# Patient Record
Sex: Female | Born: 1965 | Race: Black or African American | Hispanic: No | Marital: Single | Smoking: Never smoker
Health system: Southern US, Community
[De-identification: ages and names within clinical notes are randomized; demographics above are authoritative.]

## PROBLEM LIST (undated history)

## (undated) DIAGNOSIS — E785 Hyperlipidemia, unspecified: Secondary | ICD-10-CM

## (undated) HISTORY — DX: Hyperlipidemia, unspecified: E78.5

---

## 2006-08-29 ENCOUNTER — Ambulatory Visit: Payer: Self-pay | Admitting: Internal Medicine

## 2006-08-29 LAB — CONVERTED CEMR LAB
ALT: 12 units/L (ref 0–40)
AST: 15 units/L (ref 0–37)
Albumin: 3.8 g/dL (ref 3.5–5.2)
Alkaline Phosphatase: 55 units/L (ref 39–117)
Basophils Relative: 0.4 % (ref 0.0–1.0)
Bilirubin Urine: NEGATIVE
Chloride: 105 meq/L (ref 96–112)
Cholesterol: 212 mg/dL (ref 0–200)
Crystals: NEGATIVE
Eosinophil percent: 3.5 % (ref 0.0–5.0)
GFR calc non Af Amer: 84 mL/min
Glomerular Filtration Rate, Af Am: 102 mL/min/{1.73_m2}
Glucose, Bld: 91 mg/dL (ref 70–99)
HCT: 37.3 % (ref 36.0–46.0)
HDL: 53.6 mg/dL (ref >39.0)
LDL DIRECT: 152.7 mg/dL
Nitrite: NEGATIVE
Platelets: 267 10*3/uL (ref 150–400)
Potassium: 4 meq/L (ref 3.5–5.1)
RBC: 4.12 M/uL (ref 3.87–5.11)
RDW: 11.4 % — ABNORMAL LOW (ref 11.5–14.6)
Sodium: 141 meq/L (ref 135–145)
Specific Gravity, Urine: 1.025 (ref 1.000–1.03)
Total Bilirubin: 0.8 mg/dL (ref 0.3–1.2)
Total Protein: 7.1 g/dL (ref 6.0–8.3)
WBC: 3.6 10*3/uL — ABNORMAL LOW (ref 4.5–10.5)

## 2006-09-02 ENCOUNTER — Ambulatory Visit: Payer: Self-pay | Admitting: Internal Medicine

## 2006-11-07 ENCOUNTER — Ambulatory Visit: Payer: Self-pay | Admitting: Internal Medicine

## 2006-11-18 ENCOUNTER — Ambulatory Visit: Payer: Self-pay | Admitting: Internal Medicine

## 2007-09-14 ENCOUNTER — Encounter: Payer: Self-pay | Admitting: Internal Medicine

## 2008-01-22 ENCOUNTER — Ambulatory Visit: Payer: Self-pay | Admitting: Internal Medicine

## 2008-01-22 DIAGNOSIS — L910 Hypertrophic scar: Secondary | ICD-10-CM

## 2008-01-22 DIAGNOSIS — J309 Allergic rhinitis, unspecified: Secondary | ICD-10-CM | POA: Insufficient documentation

## 2009-05-22 LAB — CONVERTED CEMR LAB: Pap Smear: NORMAL

## 2010-01-28 ENCOUNTER — Telehealth: Payer: Self-pay | Admitting: Internal Medicine

## 2010-02-02 ENCOUNTER — Ambulatory Visit: Payer: Self-pay | Admitting: Internal Medicine

## 2010-06-05 ENCOUNTER — Ambulatory Visit: Payer: Self-pay | Admitting: Internal Medicine

## 2010-06-05 DIAGNOSIS — E785 Hyperlipidemia, unspecified: Secondary | ICD-10-CM

## 2010-06-05 LAB — CONVERTED CEMR LAB
Albumin: 3.9 g/dL (ref 3.5–5.2)
BUN: 15 mg/dL (ref 6–23)
Basophils Absolute: 0 10*3/uL (ref 0.0–0.1)
Chloride: 105 meq/L (ref 96–112)
Cholesterol: 220 mg/dL — ABNORMAL HIGH (ref 0–200)
Creatinine, Ser: 0.8 mg/dL (ref 0.4–1.2)
Direct LDL: 151.3 mg/dL
Eosinophils Absolute: 0.1 10*3/uL (ref 0.0–0.7)
GFR calc non Af Amer: 104.75 mL/min (ref >60)
HCT: 35.7 % — ABNORMAL LOW (ref 36.0–46.0)
Hemoglobin: 11.6 g/dL — ABNORMAL LOW (ref 12.0–15.0)
Leukocytes, UA: NEGATIVE
Lymphs Abs: 1.5 10*3/uL (ref 0.7–4.0)
MCHC: 32.6 g/dL (ref 30.0–36.0)
Monocytes Relative: 8.9 % (ref 3.0–12.0)
Neutro Abs: 2.9 10*3/uL (ref 1.4–7.7)
Nitrite: NEGATIVE
Platelets: 249 10*3/uL (ref 150.0–400.0)
Potassium: 4.4 meq/L (ref 3.5–5.1)
RDW: 13 % (ref 11.5–14.6)
Specific Gravity, Urine: >=1.03 (ref 1.000–1.030)
TSH: 0.78 microintl units/mL (ref 0.35–5.50)
Total Bilirubin: 0.8 mg/dL (ref 0.3–1.2)
Total CHOL/HDL Ratio: 4
Triglycerides: 41 mg/dL (ref 0.0–149.0)
Urobilinogen, UA: 1 (ref 0.0–1.0)
VLDL: 8.2 mg/dL (ref 0.0–40.0)
pH: 6.5 (ref 5.0–8.0)

## 2010-11-10 NOTE — Assessment & Plan Note (Signed)
Summary: CPX/AETNA/#/CD   Vital Signs:  Patient profile:   45 year old female Height:      61 inches Weight:      138 pounds BMI:     26.17 O2 Sat:      99 % on Room air Temp:     98.5 degrees F oral Pulse rate:   67 / minute BP sitting:   110 / 70  (left arm) Cuff size:   regular  Vitals Entered By: Bill Salinas CMA (June 05, 2010 3:22 PM)  O2 Flow:  Room air CC: cpx/ ab   Primary Care Provider:  Norins  CC:  cpx/ ab.  History of Present Illness: Patient presents for routine medical exam. She sees Abigail Harman, MD in [facility] for Gyn - last exam  Sept '09. She has an appointment Monday, Aug 29th. She is good: no major illness, no surgies no injuries.  Current Medications (verified): 1)  None  Allergies (verified): 1)  Sulfamethoxazole (Sulfamethoxazole)  Past History:  Past Surgical History: C-section - uncomplicated  Family History: father - '30; HTN mother - '39: HTN, DM Maternal Kin wiht DM Neg- breast or colon cancer; CAD  Social History: [employer]. Married '04 Step-daughter, 1 daughter - '05 work: Sales promotion account executive -Engineer, maintenance (IT) close to her extended family in [place]- some are migrating south Marriage in good health, SO in good health   Review of Systems  The patient denies anorexia, fever, weight loss, weight gain, vision loss, decreased hearing, hoarseness, chest pain, syncope, dyspnea on exertion, prolonged cough, headaches, hemoptysis, abdominal pain, severe indigestion/heartburn, hematuria, incontinence, muscle weakness, suspicious skin lesions, transient blindness, difficulty walking, depression, abnormal bleeding, enlarged lymph nodes, angioedema, and breast masses.    Physical Exam  General:  Well-developed,well-nourished,in no acute distress; alert,appropriate and cooperative throughout examination Head:  Normocephalic and atraumatic without obvious abnormalities. No apparent alopecia or balding. Eyes:   vision grossly intact, pupils equal, pupils round, pupils react to accomodation, corneas and lenses clear, no injection, no optic disk abnormalities, and no retinal abnormalitiies.   Ears:  External ear exam shows no significant lesions or deformities.  Otoscopic examination reveals clear canals, tympanic membranes are intact bilaterally without bulging, retraction, inflammation or discharge. Hearing is grossly normal bilaterally. Nose:  no external deformity and no external erythema.   Mouth:  Oral mucosa and oropharynx without lesions or exudates.  Teeth in good repair. Neck:  supple, full ROM, no thyromegaly, and no carotid bruits.   Chest Wall:  No deformities, masses, or tenderness noted. Breasts:  deferred to gyn Lungs:  Normal respiratory effort, chest expands symmetrically. Lungs are clear to auscultation, no crackles or wheezes. Heart:  Normal rate and regular rhythm. S1 and S2 normal without gallop, murmur, click, rub or other extra sounds. Abdomen:  soft, non-tender, normal bowel sounds, no masses, no guarding, and no hepatomegaly.   Genitalia:  deferred to gyn  Msk:  normal ROM, no joint tenderness, no joint swelling, and no joint warmth.   Pulses:  2+ radial and DP pulses Extremities:  No clubbing, cyanosis, edema, or deformity noted with normal full range of motion of all joints.   Neurologic:  alert & oriented X3, cranial nerves II-XII intact, strength normal in all extremities, sensation intact to light touch, gait normal, and DTRs symmetrical and normal.   Skin:  turgor normal, color normal, no suspicious lesions, and no ecchymoses.   Cervical Nodes:  no anterior cervical adenopathy and no posterior cervical adenopathy.  Psych:  Oriented X3, memory intact for recent and remote, normally interactive, good eye contact, and not anxious appearing.     Impression & Recommendations:  Problem # 1:  ALLERGIC RHINITIS (ICD-477.9) Stable without active symptoms at this time.    Problem # 2:  HYPERCHOLESTEROLEMIA (ICD-272.0) Mild elevation in LDL at 151 with a goal of 409 or less. NCEP guidelines with a treatment threshhold of 160 +.   Plan - life-style management with diet and exercise. Provided referral to [employer]. gov for additional information.          follow-up lab in 6 months  Problem # 3:  Preventive Health Care (ICD-V70.0) Unremarkable interval history. Normal exam. Labs normal except for cholesterol. Current with Gyn and breast health. For tetnus today.  In summary - a very nice woman who is medically stable with mild elevation in cholesterol. Return for lab as noted. Return for general exam and lab in 3 years otherwise return as needed.    Patient: Abigail [name] Note: All result statuses are Final unless otherwise noted.  Tests: (1) BMP (METABOL)   Sodium                    141 mEq/L                   135-145   Potassium                 4.4 mEq/L                   3.5-5.1   Chloride                  105 mEq/L                   96-112   Carbon Dioxide            29 mEq/L                    19-32   Glucose                   80 mg/dL                    81-19   BUN                       15 mg/dL                    1-47   Creatinine                0.8 mg/dL                   8.2-9.5   Calcium                   9.2 mg/dL                   6.2-13.0   GFR                       104.75 mL/min               >60  Tests: (2) Lipid Panel (LIPID)   Cholesterol          [H]  220 mg/dL  0-200     ATP III Classification            Desirable:  < 200 mg/dL                    Borderline High:  200 - 239 mg/dL               High:  > = 240 mg/dL   Triglycerides             41.0 mg/dL                  1.6-109.6     Normal:  <150 mg/dL     Borderline High:  045 - 199 mg/dL   HDL                       40.98 mg/dL                 >11.91   VLDL Cholesterol          8.2 mg/dL                   4.7-82.9  CHO/HDL Ratio:  CHD Risk                              4                    Men          Women     1/2 Average Risk     3.4          3.3     Average Risk          5.0          4.4     2X Average Risk          9.6          7.1     3X Average Risk          15.0          11.0                           Tests: (3) CBC Platelet w/Diff (CBCD)   White Cell Count          5.0 K/uL                    4.5-10.5   Red Cell Count            3.93 Mil/uL                 3.87-5.11   Hemoglobin           [L]  11.6 g/dL                   56.2-13.0   Hematocrit           [L]  35.7 %                      36.0-46.0   MCV                       90.7 fl                     78.0-100.0   MCHC  32.6 g/dL                   04.5-40.9   RDW                       13.0 %                      11.5-14.6   Platelet Count            249.0 K/uL                  150.0-400.0   Neutrophil %              57.8 %                      43.0-77.0   Lymphocyte %              30.8 %                      12.0-46.0   Monocyte %                8.9 %                       3.0-12.0   Eosinophils%              2.0 %                       0.0-5.0   Basophils %               0.5 %                       0.0-3.0   Neutrophill Absolute      2.9 K/uL                    1.4-7.7   Lymphocyte Absolute       1.5 K/uL                    0.7-4.0   Monocyte Absolute         0.4 K/uL                    0.1-1.0  Eosinophils, Absolute                             0.1 K/uL                    0.0-0.7   Basophils Absolute        0.0 K/uL                    0.0-0.1  Tests: (4) Hepatic/Liver Function Panel (HEPATIC)   Total Bilirubin           0.8 mg/dL                   8.1-1.9   Direct Bilirubin          0.1 mg/dL                   1.4-7.8   Alkaline Phosphatase      47 U/L  39-117   AST                       12 U/L                      0-37   ALT                       8 U/L                       0-35   Total Protein             6.7 g/dL                     1.6-1.0   Albumin                   3.9 g/dL                    9.6-0.4  Tests: (5) TSH (TSH)   FastTSH                   0.78 uIU/mL                 0.35-5.50  Tests: (6) UDip Only (UDIP)   Color                     YELLOW       RANGE:  Yellow;Lt. Yellow   Clarity                   CLEAR                       Clear   Specific Gravity          >=1.030                     1.000 - 1.030   Urine Ph                  6.5                         5.0-8.0   Protein                   NEGATIVE                    Negative   Urine Glucose             NEGATIVE                    Negative   Ketones                   NEGATIVE                    Negative   Urine Bilirubin           NEGATIVE                    Negative   Blood                     NEGATIVE                    Negative   Urobilinogen  1.0                         0.0 - 1.0   Leukocyte Esterace        NEGATIVE                    Negative   Nitrite                   NEGATIVE                    Negative  Tests: (7) Cholesterol LDL - Direct (DIRLDL)  Cholesterol LDL - Direct                             151.3 mg/dL     Optimal:  <161 mg/dL     Near or Above Optimal:  100-129 mg/dL     Borderline High:  096-045 mg/dL     High:  409-811 mg/dL     Very High:  >914 mg/dL  Preventive Care Screening  Pap Smear:    Date:  05/22/2009    Results:  normal

## 2010-11-10 NOTE — Progress Notes (Signed)
Summary: STREP?   Phone Note Call from Patient   Caller: [phone] Summary of Call: Pt c/o of "not feeling 100%". Has some sinus congestion and clear drainage. No fever, cough, sore throat, body aches or chills. Took otc benadryl and c/o some dizzyness after. Daughter was just dx w/strep, she had no sore throat or fever.  Pt scheduled for apt Monday. Advised her to call office if any new symptoms or concerns.  Initial call taken by: Lamar Sprinkles, CMA,  January 28, 2010 11:43 AM  Follow-up for Phone Call        agree with recommendation Follow-up by: Jacques Navy MD,  January 28, 2010 1:01 PM

## 2010-11-16 ENCOUNTER — Other Ambulatory Visit: Payer: Self-pay

## 2011-02-04 ENCOUNTER — Other Ambulatory Visit (INDEPENDENT_AMBULATORY_CARE_PROVIDER_SITE_OTHER): Payer: Self-pay

## 2011-02-04 ENCOUNTER — Telehealth: Payer: Self-pay | Admitting: *Deleted

## 2011-02-04 DIAGNOSIS — N3000 Acute cystitis without hematuria: Secondary | ICD-10-CM

## 2011-02-04 LAB — URINALYSIS, ROUTINE W REFLEX MICROSCOPIC
Bilirubin Urine: NEGATIVE
Ketones, ur: NEGATIVE
Urobilinogen, UA: 0.2 (ref 0.0–1.0)
pH: 6.5 (ref 5.0–8.0)

## 2011-02-04 MED ORDER — CIPROFLOXACIN HCL 250 MG PO TABS: 250.0000 mg | ORAL_TABLET | Freq: Two times a day (BID) | ORAL | Status: AC

## 2011-02-04 NOTE — Telephone Encounter (Signed)
Patient requesting rx for UTI - Would you like pt to have labs today? She is leaving to go out of town in the am

## 2011-02-04 NOTE — Telephone Encounter (Signed)
Patient informed. 

## 2011-02-04 NOTE — Telephone Encounter (Signed)
Results ready, please advise.

## 2011-02-04 NOTE — Telephone Encounter (Signed)
Moderately positive U/A - will eScribe cipro 250 mg bid x 5. Order in but not signed - don't know her pharmacy

## 2011-02-04 NOTE — Telephone Encounter (Signed)
OK u/a per MD. She works in [employer] - will pt lab in as stat. Hold open for results.

## 2011-02-15 ENCOUNTER — Telehealth: Payer: Self-pay | Admitting: *Deleted

## 2011-02-15 MED ORDER — FLUCONAZOLE 150 MG PO TABS: 150.0000 mg | ORAL_TABLET | Freq: Once | ORAL | Status: AC

## 2011-02-15 NOTE — Telephone Encounter (Signed)
Patient informed. 

## 2011-02-15 NOTE — Telephone Encounter (Signed)
Patient requesting RX for vaginal yeast infection - she just completed cipro for UTI.

## 2011-02-15 NOTE — Telephone Encounter (Signed)
K. Diflucan 150 sent to pharmacy

## 2011-02-26 NOTE — Assessment & Plan Note (Signed)
[facility]                             PRIMARY CARE OFFICE NOTE   NAME:STEVENSBasia, [name]                         MRN:          [id number]  DATE:09/02/2006                            DOB:          05-08-1966    Abigail [name] is a delightful African American woman, who presents for a  general medical followup.  She was last seen April 04, 2003.   INTERVAL HISTORY:  The patient had a 45-year-old daughter in the interval.  She reports her health has otherwise been excellent.   PAST MEDICAL HISTORY:  SURGERY:  None.  MEDICAL:  The patient had the usual  childhood diseases of mumps and chicken pox.  She is fully immunized.  She  has had the single pregnancy.   FAMILY HISTORY:  Mother with hypertension, father with hypertension.  There  is a positive family history in maternal kinship for diabetes.  Negative  history of coronary artery disease, breast cancer, lung cancer or colon  cancer.   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  The patient has now  been married for almost 4 years.  Her husband works for [employer].  The  patient also reports that she adopted her niece's children, ages 70 and 66,  after her untimely death from breast cancer at an early age.   CURRENT MEDICATIONS:  None.   REVIEW OF SYSTEMS:  The patient has had no constitutional problems.  She has  had an eye exam in the last 24 months, no ENT problems.  The patient has had  several episodes of right chest pain that is intermittent in nature, can  last up to 30 minutes, occurs at rest.  She has no exertional discomfort or  pain.  She has had no limitation in her activities.  She has had no  respiratory, GI, GU or musculoskeletal problems.   PHYSICAL EXAMINATION:  Temperature was 96.8, blood pressure 90/70, pulse was  60,  weight 147.  GENERAL APPEARANCE:  This is a well-groomed, well-nourished woman,  attractively dressed, in no acute distress.  HEENT:  Normocephalic, atraumatic.  EACs and TMS were normal.  Oropharynx  was native dentition in good repair, no buccal or palate lesions were noted,  the posterior pharynx was clear, conjunctivae and sclerae were clear,  PERRLA, EOMI.  Funduscopic exam was unremarkable.  NECK:  Supple without thyromegaly.  NODES:  No adenopathy was noted in the cervical or supraclavicular regions.  CHEST:  No CVA tenderness.  LUNGS:  Clear to auscultation and percussion.  CARDIOVASCULAR:  2+ radial pulses, no JVD or carotid bruits.  She had a  quiet precordium with a regular rate and rhythm, without murmurs, rubs or  gallops.  ABDOMEN:  Soft, no guarding, no rebound, no organosplenomegaly was noted.  BREAST/PELVIC:  Exam was deferred to gynecology, Dr. Neva Seat in [facility].  EXTREMITIES:  Without clubbing, cyanosis, edema or deformity.  NEUROLOGIC:  Exam was grossly nonfocal.  SKIN:  Clear.   DATABASE:  Hemoglobin was 12 grams, white count was 3600, but a normal  differential was noted.  Platelet count 267,000.  Chemistries revealed a  glucose of 91, electrolytes were normal, kidney function normal with a  creatinine of 0.8, GFR of 102.  Liver functions were normal.  Cholesterol  was 212, triglycerides 35, HDL was 53.6, LDL was elevated at 152.7.  Thyroid  function normal with a TSH of 1.08.  Urinalysis was negative.   ASSESSMENT AND PLAN:  1. Hyperlipidemia.  Patient with mild hyperlipidemia, with an LDL above      goal of 130.  In reviewing the patient's chart, she had an LDL of 147.9      in 2004.  PLAN:  Lifestyle management with a low-fat diet and continued      regular exercise.  I have recommended a followup study in 3 to 6 months      for monitoring, and if she remains above the 130 goal could consider      her as a candidate for medical intervention, although her  cardiac risk      profile is quite low.  2. Health maintenance.  The patient is current with her gynecologist.  Her      examination is otherwise unremarkable.  I would recommend a general      internal medicine physical exam in 3 years.  We do need to follow up on      her lipids, however.     Rosalyn Gess Norins, MD  Electronically Signed    MEN/MedQ  DD: 09/03/2006  DT: 09/03/2006  Job #: [id number]   cc:   Prince Solian

## 2011-02-26 NOTE — Assessment & Plan Note (Signed)
[facility]                                 ON-CALL NOTE   NAME:STEVENSKassidee, Narciso                         MRN:          [id number]  DATE:11/06/2006                            DOB:          02-19-1966    TIME RECEIVED:  1:58pm.   PHYSICIAN:  Dr. Debby Bud.   TELEPHONE:  [phone].   The patient complains of several days of head cold symptoms with sinus  pressure and headache, now today she has an earache. There is no fever  or coughing. She is drinking fluids. My response is to use ibuprofen, up  to 800 mg every 6 hours as needed, also Sudafed as needed. She can be  seen in the office tomorrow if she is no better.     Tera Mater. Clent Ridges, MD  Electronically Signed    SAF/MedQ  DD: 11/06/2006  DT: 11/07/2006  Job #: [id number]

## 2011-05-21 ENCOUNTER — Telehealth: Payer: Self-pay | Admitting: *Deleted

## 2011-05-21 MED ORDER — CIPROFLOXACIN HCL 250 MG PO TABS: 250.0000 mg | ORAL_TABLET | Freq: Two times a day (BID) | ORAL | Status: AC

## 2011-05-21 NOTE — Telephone Encounter (Signed)
Called - no answer. Left a msg - will call in cipro for possible UTI. Advised taht she may want to make an ov- it has been a while

## 2011-05-21 NOTE — Telephone Encounter (Signed)
Pt has contracted another UTI and would like something called in. Please Advise

## 2011-06-04 ENCOUNTER — Telehealth: Payer: Self-pay | Admitting: *Deleted

## 2011-06-04 DIAGNOSIS — Z0000 Encounter for general adult medical examination without abnormal findings: Secondary | ICD-10-CM

## 2011-06-04 NOTE — Telephone Encounter (Signed)
Received staff msg pt is needing CPX labs entered in EPIC. Has appt set for 08/04/11. Entered labs in EPIC.Marland KitchenMarland Kitchen8/24/12@1 :02pm/LMB

## 2011-06-25 ENCOUNTER — Other Ambulatory Visit (HOSPITAL_COMMUNITY)
Admission: RE | Admit: 2011-06-25 | Discharge: 2011-06-25 | Disposition: A | Discharge status: Home / Self Care | Payer: Managed Care, Other (non HMO) | Source: Ambulatory Visit | Attending: Obstetrics and Gynecology | Admitting: Obstetrics and Gynecology

## 2011-06-25 ENCOUNTER — Other Ambulatory Visit: Payer: Self-pay | Admitting: Obstetrics and Gynecology

## 2011-06-25 DIAGNOSIS — Z1159 Encounter for screening for other viral diseases: Secondary | ICD-10-CM | POA: Insufficient documentation

## 2011-06-25 DIAGNOSIS — Z124 Encounter for screening for malignant neoplasm of cervix: Secondary | ICD-10-CM | POA: Insufficient documentation

## 2011-08-03 ENCOUNTER — Encounter: Payer: Self-pay | Admitting: *Deleted

## 2011-08-04 ENCOUNTER — Other Ambulatory Visit: Payer: Self-pay | Admitting: Internal Medicine

## 2011-08-04 ENCOUNTER — Ambulatory Visit (INDEPENDENT_AMBULATORY_CARE_PROVIDER_SITE_OTHER): Payer: Managed Care, Other (non HMO) | Admitting: Internal Medicine

## 2011-08-04 ENCOUNTER — Other Ambulatory Visit (INDEPENDENT_AMBULATORY_CARE_PROVIDER_SITE_OTHER): Payer: Managed Care, Other (non HMO)

## 2011-08-04 ENCOUNTER — Encounter: Payer: Self-pay | Admitting: Internal Medicine

## 2011-08-04 DIAGNOSIS — E7800 Pure hypercholesterolemia, unspecified: Secondary | ICD-10-CM

## 2011-08-04 DIAGNOSIS — Z0000 Encounter for general adult medical examination without abnormal findings: Secondary | ICD-10-CM

## 2011-08-04 LAB — CBC WITH DIFFERENTIAL/PLATELET
Basophils Absolute: 0 10*3/uL (ref 0.0–0.1)
Eosinophils Absolute: 0.1 10*3/uL (ref 0.0–0.7)
HCT: 36.9 % (ref 36.0–46.0)
Lymphs Abs: 1.3 10*3/uL (ref 0.7–4.0)
MCHC: 32.7 g/dL (ref 30.0–36.0)
Monocytes Absolute: 0.4 10*3/uL (ref 0.1–1.0)
Monocytes Relative: 12 % (ref 3.0–12.0)
Platelets: 240 10*3/uL (ref 150.0–400.0)
RDW: 12.7 % (ref 11.5–14.6)

## 2011-08-04 LAB — BASIC METABOLIC PANEL
CO2: 30 mEq/L (ref 19–32)
Calcium: 9.1 mg/dL (ref 8.4–10.5)
GFR: 92.97 mL/min (ref >60.00)
Sodium: 142 mEq/L (ref 135–145)

## 2011-08-04 LAB — HEPATIC FUNCTION PANEL
Alkaline Phosphatase: 49 U/L (ref 39–117)
Bilirubin, Direct: 0.1 mg/dL (ref 0.0–0.3)
Total Bilirubin: 0.5 mg/dL (ref 0.3–1.2)

## 2011-08-04 LAB — LIPID PANEL
Cholesterol: 202 mg/dL — ABNORMAL HIGH (ref 0–200)
Total CHOL/HDL Ratio: 3
Triglycerides: 53 mg/dL (ref 0.0–149.0)

## 2011-08-04 LAB — URINALYSIS, ROUTINE W REFLEX MICROSCOPIC
Bilirubin Urine: NEGATIVE
Ketones, ur: NEGATIVE
Specific Gravity, Urine: 1.02 (ref 1.000–1.030)
Total Protein, Urine: NEGATIVE
Urine Glucose: NEGATIVE
pH: 6 (ref 5.0–8.0)

## 2011-08-04 LAB — LDL CHOLESTEROL, DIRECT: Direct LDL: 139.4 mg/dL

## 2011-08-04 NOTE — Assessment & Plan Note (Addendum)
Reviewed NCEP guidelines and treatment thresholds. Cardia risk calculator - 1% risk of cardiac event in the next 10 years based on '11 lipid values. Current lipid panel reveals much lower LDL, close to goal of 130 or less (strong work). No indication for medical therapy.  Plan - continue life-style management.

## 2011-08-04 NOTE — Progress Notes (Signed)
  Subjective:    Patient ID: Abigail [name], female    DOB: October 09, 1966, 45 y.o.   MRN: [id number]  HPI Abigail [name] presents today for an annual wellness exam. She has seen Dr. Rudean Haskell in [facility] for normal Pelvic/PAP and breast exam. She is feeling and doing well. She has been working on low fat diet and exercising on a regular basis.    Review of Systems Constitutional:  Negative for fever, chills, activity change and unexpected weight change.  HEENT:  Negative for hearing loss, ear pain, congestion, neck stiffness and postnasal drip. Negative for sore throat or swallowing problems. Negative for dental complaints. Had wisdom tooth extracted - left lower.  Eyes: Negative for vision loss or change in visual acuity.  Respiratory: Negative for chest tightness and wheezing. Negative for DOE.   Cardiovascular: Negative for chest pain or palpitations. No decreased exercise tolerance Gastrointestinal: No change in bowel habit. No bloating or gas. No reflux or indigestion Genitourinary: Negative for urgency, frequency, flank pain and difficulty urinating.  Musculoskeletal: Negative for myalgias, back pain, arthralgias and gait problem.  Neurological: Negative for dizziness, tremors, weakness and headaches.  Hematological: Negative for adenopathy.  Psychiatric/Behavioral: Negative for behavioral problems and dysphoric mood.       Objective:   Physical Exam Vitals reviewed-very stable Gen'l: well nourished, well developed AA woman in no distress who looks younger than her stated age.  HEENT - [place]/AT, EACs/TMs normal, oropharynx with native dentition in good condition, no buccal or palatal lesions, posterior pharynx clear, mucous membranes moist. C&S clear, PERRLA, fundi - normal Neck - supple, no thyromegaly Nodes- negative submental, cervical, supraclavicular regions Chest - no deformity, no CVAT Lungs - cleat without rales, wheezes. No increased work of breathing Breast - deferred to  gyn Cardiovascular - regular rate and rhythm, quiet precordium, no murmurs, rubs or gallops, 2+ radial. Abdomen - BS+ x 4, no HSM, no guarding or rebound or tenderness Pelvic - deferred to gyn Rectal - deferred to gyn Extremities - no clubbing, cyanosis, edema or deformity.  Neuro - A&O x 3, CN II-XII normal, motor strength normal and equal, DTRs 2+ and symmetrical biceps, radial, and patellar tendons. Cerebellar - no tremor, no rigidity, fluid movement and normal gait. Derm - Head, neck, back, abdomen and extremities without suspicious lesions  Lab Results  Component Value Date   WBC 3.3* 08/04/2011   HGB 12.1 08/04/2011   HCT 36.9 08/04/2011   PLT 240.0 08/04/2011   GLUCOSE 90 08/04/2011   CHOL 202* 08/04/2011   TRIG 53.0 08/04/2011   HDL 58.40 08/04/2011   LDLDIRECT 139.4 08/04/2011   ALT 14 08/04/2011   AST 14 08/04/2011   NA 142 08/04/2011   K 4.6 08/04/2011   CL 107 08/04/2011   CREATININE 0.9 08/04/2011   BUN 15 08/04/2011   CO2 30 08/04/2011   TSH 0.79 08/04/2011           Assessment & Plan:

## 2011-08-04 NOTE — Assessment & Plan Note (Signed)
Interval history is unremarkable. She has been investing in her health: good diet, regular exercise. She is current with her gynecologist for well-woman care. Lab results are excellent. She is current with tetanus immunization.  In summary - a delightful woman who is in good health with a low risk profile. She will return in 18 -24 months for routine follow-up or sooner as needed.

## 2011-09-10 ENCOUNTER — Ambulatory Visit (INDEPENDENT_AMBULATORY_CARE_PROVIDER_SITE_OTHER): Payer: Managed Care, Other (non HMO) | Admitting: Endocrinology

## 2011-09-10 ENCOUNTER — Encounter: Payer: Self-pay | Admitting: Endocrinology

## 2011-09-10 VITALS — BP 114/88 | HR 74 | Temp 98.2°F | Resp 16 | Ht 60.0 in | Wt 143.0 lb

## 2011-09-10 DIAGNOSIS — J209 Acute bronchitis, unspecified: Secondary | ICD-10-CM

## 2011-09-10 MED ORDER — AZITHROMYCIN 500 MG PO TABS: 500.0000 mg | ORAL_TABLET | Freq: Every day | ORAL | Status: AC

## 2011-09-10 NOTE — Progress Notes (Signed)
  Subjective:    Patient ID: Abigail [name], female    DOB: 1966/06/19, 45 y.o.   MRN: [id number]  HPI Pt states 6 days of dry-quality cough in the chest, but no assoc fever. Past Medical History  Diagnosis Date  . Allergic rhinitis   . Hyperlipidemia     LDL 150's '11    Past Surgical History  Procedure Date  . Cesarean section     History   Social History  . Marital Status: Married    Spouse Name: N/A    Number of Children: 3  . Years of Education: 16   Occupational History  . investment counselor    Social History Main Topics  . Smoking status: Never Smoker   . Smokeless tobacco: Never Used  . Alcohol Use: Yes     rare use - twice rare  . Drug Use: No  . Sexually Active: Yes -- Female partner(s)   Other Topics Concern  . Not on file   Social History Narrative   HSG, [employer]- BA -business. Married '04, 1 dtr - '05, raising niece and nephew. Work - Haematologist. Marriage in good. No history of abuse. Working on [employer] -business education at [employer] (oct '12)    No current outpatient prescriptions on file prior to visit.    Allergies  Allergen Reactions  . Sulfamethoxazole     REACTION: swelling    Family History  Problem Relation Age of Onset  . Diabetes Mother   . Hypertension Mother   . Hypertension Father   . Cancer Neg Hx   . Hypertension Sister   . Heart disease Sister     MI - no serious damage to heart   BP 114/88  Pulse 74  Temp(Src) 98.2 F (36.8 C) (Oral)  Resp 16  Ht 5' (1.524 m)  Wt 143 lb (64.864 kg)  BMI 27.93 kg/m2  LMP 09/01/2011  Review of Systems Denies earache and wheezing.  She has nasal congestion.      Objective:   Physical Exam VITAL SIGNS:  See vs page GENERAL: no distress head: no deformity eyes: no periorbital swelling, no proptosis external nose and ears are normal mouth: no lesion seen TM's are red bilat thyroid LUNGS:  Clear to auscultation.     Assessment & Plan:  Acute bronchitis, new

## 2011-09-10 NOTE — Patient Instructions (Addendum)
i have sent a prescription to your pharmacy, for an antibiotic.  Loratadine-d (non-prescription) will help your congestion. I hope you feel better soon.  If you don't feel better by next week, please call dr Debby Bud.

## 2012-07-17 ENCOUNTER — Other Ambulatory Visit: Payer: Self-pay | Admitting: Obstetrics and Gynecology

## 2012-07-17 ENCOUNTER — Other Ambulatory Visit (HOSPITAL_COMMUNITY)
Admission: RE | Admit: 2012-07-17 | Discharge: 2012-07-17 | Disposition: A | Discharge status: Home / Self Care | Payer: Managed Care, Other (non HMO) | Source: Ambulatory Visit | Attending: Obstetrics and Gynecology | Admitting: Obstetrics and Gynecology

## 2012-07-17 DIAGNOSIS — Z124 Encounter for screening for malignant neoplasm of cervix: Secondary | ICD-10-CM | POA: Insufficient documentation

## 2012-09-04 ENCOUNTER — Encounter: Payer: Managed Care, Other (non HMO) | Admitting: Internal Medicine

## 2012-09-04 DIAGNOSIS — Z0289 Encounter for other administrative examinations: Secondary | ICD-10-CM

## 2012-11-22 ENCOUNTER — Ambulatory Visit (INDEPENDENT_AMBULATORY_CARE_PROVIDER_SITE_OTHER): Payer: Managed Care, Other (non HMO) | Admitting: Internal Medicine

## 2012-11-22 ENCOUNTER — Encounter: Payer: Self-pay | Admitting: Internal Medicine

## 2012-11-22 VITALS — BP 110/64 | HR 64 | Temp 98.2°F | Resp 16 | Ht 61.0 in | Wt 146.0 lb

## 2012-11-22 DIAGNOSIS — Z0000 Encounter for general adult medical examination without abnormal findings: Secondary | ICD-10-CM

## 2012-11-22 DIAGNOSIS — E7800 Pure hypercholesterolemia, unspecified: Secondary | ICD-10-CM

## 2012-11-22 DIAGNOSIS — Z23 Encounter for immunization: Secondary | ICD-10-CM

## 2012-11-22 NOTE — Addendum Note (Signed)
Addended by: Rock Nephew T on: 11/22/2012 11:51 AM   Modules accepted: Orders

## 2012-11-22 NOTE — Assessment & Plan Note (Signed)
2012 labs: TC 202, LDL 139, HDL 58  Plan Healthy life-style with a low fat diet.

## 2012-11-22 NOTE — Patient Instructions (Addendum)
Thanks for coming to see me.  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 Treatment regular flex/stretch, lower the heels, use of rub of choice. You can go to [email or web address] for more information.  You are doing great. Your labs in 2012 were 100% normal. Your cholesterol was 202, the LDL (bad) cholesterol was 161, 9 points above goal of 130 or less, the HDL was greater than goal of 40+. Thus, there is no scientific reason to repeat lab.  You will be given a Tdap booster today good for 10 years.  Please consider exercise as a job not a leisure time activity. Read "younger Next Year - for women" a guaranteed  Enjoyable read that is very educational and fun.  You should have a full physical exam in 2 years or so.   Please sign up for MyChart - it is a Photographer.

## 2012-11-22 NOTE — Progress Notes (Signed)
Subjective:    Patient ID: Abigail [name], female    DOB: 1966-06-16, 47 y.o.   MRN: [id number]  HPI Abigail [name] presents for a general medical wellness exam. She is feeling well and doing: Life is good. She has seen her gynecologist Sept '13 and had a normal exam, including breast exam. She has had no major illness, surgery or injury.   \ Past Medical History  Diagnosis Date  . Allergic rhinitis   . Hyperlipidemia     LDL 150's '11   Past Surgical History  Procedure Laterality Date  . Cesarean section     Family History  Problem Relation Age of Onset  . Diabetes Mother   . Hypertension Mother   . Hypertension Father   . Cancer Neg Hx   . Hypertension Sister   . Heart disease Sister     MI - no serious damage to heart   History   Social History  . Marital Status: Married    Spouse Name: N/A    Number of Children: 3  . Years of Education: 16   Occupational History  . investment counselor    Social History Main Topics  . Smoking status: Never Smoker   . Smokeless tobacco: Never Used  . Alcohol Use: Yes     Comment: rare use - twice rare  . Drug Use: No  . Sexually Active: Yes -- Female partner(s)   Other Topics Concern  . Not on file   Social History Narrative   HSG, [employer]- BA -business. Married '04, 1 dtr - '05, raising niece and nephew. Work - Haematologist. Marriage in good. No history of abuse. Working on [employer] -business education at [employer] (oct '12)   Meds - none   Review of Systems Constitutional:  Negative for fever, chills, activity change and unexpected weight change.  HEENT:  Negative for hearing loss, ear pain, congestion, neck stiffness and postnasal drip. Negative for sore throat or swallowing problems. Negative for dental complaints.   Eyes: Negative for vision loss or change in visual acuity.  Respiratory: Negative for chest tightness and wheezing. Negative for DOE.   Cardiovascular: Negative for chest pain or palpitations. No decreased exercise  tolerance Gastrointestinal: No change in bowel habit. No bloating or gas. No reflux or indigestion Genitourinary: Negative for urgency, frequency, flank pain and difficulty urinating.  Musculoskeletal: Negative for myalgias, back pain, arthralgias and gait problem. Mild supra-patellar pain, worse with stair climbing. Neurological: Negative for dizziness, tremors, weakness and headaches.  Hematological: Negative for adenopathy.  Psychiatric/Behavioral: Negative for behavioral problems and dysphoric mood.       Objective:   Physical Exam Filed Vitals:   11/22/12 1014  BP: 110/64  Pulse: 64  Temp: 98.2 F (36.8 C)  Resp: 16   Wt Readings from Last 3 Encounters:  11/22/12 146 lb (66.225 kg)  09/10/11 143 lb (64.864 kg)  08/04/11 140 lb (63.504 kg)   Gen'l: well nourished, well developed AA Woman in no distress HEENT - [place]/AT, EACs/TMs normal, oropharynx with native dentition in good condition, no buccal or palatal lesions, posterior pharynx clear, mucous membranes moist. C&S clear, PERRLA, fundi - normal Neck - supple, no thyromegaly Nodes- negative submental, cervical, supraclavicular regions Chest - no deformity, no CVAT Lungs - clear without rales, wheezes. No increased work of breathing Breast - deferred to gyn Cardiovascular - regular rate and rhythm, quiet precordium, no murmurs, rubs or gallops, 2+ radial, DP and PT pulses Abdomen - BS+ x 4, no HSM,  no guarding or rebound or tenderness Pelvic - deferred to gyn Rectal - deferred to gyn Extremities - no clubbing, cyanosis, edema or deformity.  Neuro - A&O x 3, CN II-XII normal, motor strength normal and equal, DTRs 2+ and symmetrical biceps, radial, and patellar tendons. Cerebellar - no tremor, no rigidity, fluid movement and normal gait. Derm - Head, neck, back, abdomen and extremities without suspicious lesions  Lab Results  Component Value Date   WBC 3.3* 08/04/2011   HGB 12.1 08/04/2011   HCT 36.9 08/04/2011   PLT  240.0 08/04/2011   GLUCOSE 90 08/04/2011   CHOL 202* 08/04/2011   TRIG 53.0 08/04/2011   HDL 58.40 08/04/2011   LDLDIRECT 139.4 08/04/2011   ALT 14 08/04/2011   AST 14 08/04/2011   NA 142 08/04/2011   K 4.6 08/04/2011   CL 107 08/04/2011   CREATININE 0.9 08/04/2011   BUN 15 08/04/2011   CO2 30 08/04/2011   TSH 0.79 08/04/2011          Assessment & Plan:  1. 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 Treatment regular flex/stretch, lower the heels, use of rub of choice. You can go to [email or web address] for more information.

## 2012-11-22 NOTE — Assessment & Plan Note (Signed)
Interval history is benign. Physical exam, sans breast and pelvic, is normal. Knee without grinding or instability. Current wit Gyn. Due for colorectal cancer screening in 2017.  Immunization - Tdap today which is good for 10 years.  Weight management: smart food choices, PORTION SIZE CONTROL  - use your hand as a guide, regular exercise = 30 minutes 3 times a week minimum working to a sustained heart rate of 120. Goal - to loose 1 lb/month. [employer] weight 130 lbs = 16 month project.  In summary - a very nice woman who is medically stable. She is advised to return for general exam in 24-36 months, sooner as needed.

## 2013-08-16 ENCOUNTER — Other Ambulatory Visit: Payer: Self-pay

## 2013-10-30 ENCOUNTER — Telehealth: Payer: Self-pay | Admitting: Internal Medicine

## 2013-10-30 NOTE — Telephone Encounter (Signed)
Pt wants to transfer to Dr. Asa Lente after March when Dr. Benson [place].  She has an appt with Dr. Linda Hedges in late March. Will this be ok?

## 2013-10-30 NOTE — Telephone Encounter (Signed)
Pt is aware.  She will schedule after she sees Dr. Linda Hedges in March.

## 2013-10-30 NOTE — Telephone Encounter (Signed)
A) keep track of #pt reassigned per provider at [facility].... B) ok

## 2014-01-02 ENCOUNTER — Encounter: Payer: Managed Care, Other (non HMO) | Admitting: Internal Medicine

## 2014-01-02 DIAGNOSIS — Z0289 Encounter for other administrative examinations: Secondary | ICD-10-CM

## 2014-07-26 ENCOUNTER — Other Ambulatory Visit: Payer: Self-pay

## 2014-08-30 ENCOUNTER — Ambulatory Visit (INDEPENDENT_AMBULATORY_CARE_PROVIDER_SITE_OTHER): Payer: Managed Care, Other (non HMO) | Admitting: Internal Medicine

## 2014-08-30 ENCOUNTER — Encounter: Payer: Self-pay | Admitting: Internal Medicine

## 2014-08-30 ENCOUNTER — Other Ambulatory Visit (INDEPENDENT_AMBULATORY_CARE_PROVIDER_SITE_OTHER): Payer: Managed Care, Other (non HMO)

## 2014-08-30 VITALS — BP 108/64 | HR 63 | Temp 98.5°F | Resp 12 | Ht 61.0 in | Wt 158.4 lb

## 2014-08-30 DIAGNOSIS — Z0000 Encounter for general adult medical examination without abnormal findings: Secondary | ICD-10-CM

## 2014-08-30 DIAGNOSIS — E785 Hyperlipidemia, unspecified: Secondary | ICD-10-CM

## 2014-08-30 LAB — LIPID PANEL
Cholesterol: 238 mg/dL — ABNORMAL HIGH (ref 0–200)
HDL: 54.1 mg/dL (ref >39.00)
LDL CALC: 170 mg/dL — AB (ref 0–99)
NONHDL: 183.9
Total CHOL/HDL Ratio: 4
Triglycerides: 68 mg/dL (ref 0.0–149.0)
VLDL: 13.6 mg/dL (ref 0.0–40.0)

## 2014-08-30 LAB — BASIC METABOLIC PANEL
BUN: 13 mg/dL (ref 6–23)
CALCIUM: 9.2 mg/dL (ref 8.4–10.5)
CO2: 29 mEq/L (ref 19–32)
Chloride: 103 mEq/L (ref 96–112)
Creatinine, Ser: 0.8 mg/dL (ref 0.4–1.2)
GFR: 101.3 mL/min (ref >60.00)
GLUCOSE: 87 mg/dL (ref 70–99)
Potassium: 3.8 mEq/L (ref 3.5–5.1)
SODIUM: 138 meq/L (ref 135–145)

## 2014-08-30 NOTE — Progress Notes (Signed)
Pre visit review using our clinic review tool, if applicable. No additional management support is needed unless otherwise documented below in the visit note. 

## 2014-08-30 NOTE — Patient Instructions (Signed)
We will check your cholesterol today and call you back with the results.  Come back in about one year. If you have any problems or questions please feel free to call our office.  Exercise to Lose Weight Exercise and a healthy diet may help you lose weight. Your doctor may suggest specific exercises. EXERCISE IDEAS AND TIPS  Choose low-cost things you enjoy doing, such as walking, bicycling, or exercising to workout videos.  Take stairs instead of the elevator.  Walk during your lunch break.  Park your car further away from work or school.  Go to a gym or an exercise class.  Start with 5 to 10 minutes of exercise each day. Build up to 30 minutes of exercise 4 to 6 days a week.  Wear shoes with good support and comfortable clothes.  Stretch before and after working out.  Work out until you breathe harder and your heart beats faster.  Drink extra water when you exercise.  Do not do so much that you hurt yourself, feel dizzy, or get very short of breath. Exercises that burn about 150 calories:  Running 1  miles in 15 minutes.  Playing volleyball for 45 to 60 minutes.  Washing and waxing a car for 45 to 60 minutes.  Playing touch football for 45 minutes.  Walking 1  miles in 35 minutes.  Pushing a stroller 1  miles in 30 minutes.  Playing basketball for 30 minutes.  Raking leaves for 30 minutes.  Bicycling 5 miles in 30 minutes.  Walking 2 miles in 30 minutes.  Dancing for 30 minutes.  Shoveling snow for 15 minutes.  Swimming laps for 20 minutes.  Walking up stairs for 15 minutes.  Bicycling 4 miles in 15 minutes.  Gardening for 30 to 45 minutes.  Jumping rope for 15 minutes.  Washing windows or floors for 45 to 60 minutes. Document Released: 10/30/2010 Document Revised: 12/20/2011 Document Reviewed: 10/30/2010 [facility] Patient Information 2015 [place], [place]. This information is not intended to replace advice given to you by your health care  provider. Make sure you discuss any questions you have with your health care provider.

## 2014-09-01 NOTE — Assessment & Plan Note (Signed)
Patient is up-to-date on her screening, talked to her about healthy diet and exercise. She is a nonsmoker.

## 2014-09-01 NOTE — Progress Notes (Signed)
   Subjective:    Patient ID: Abigail [name], female    DOB: 05-Nov-1965, 48 y.o.   MRN: [id number]  HPI The patient is a 48 year old female who comes in today to establish care. She has no significant past medical history except borderline hyper lipidemia. She does not currently take any medications. She does not have any new complaints today. She tries to be healthy and exercise. She denies chest pain shortness of breath abdominal pains.  Review of Systems  Constitutional: Negative for fever, diaphoresis, activity change, appetite change and fatigue.  HENT: Negative.   Eyes: Negative.   Respiratory: Negative for cough, choking, chest tightness, shortness of breath and wheezing.   Cardiovascular: Negative for chest pain, palpitations and leg swelling.  Gastrointestinal: Negative for nausea, abdominal pain, diarrhea, constipation and abdominal distention.  Genitourinary: Negative.   Musculoskeletal: Negative.   Skin: Negative.   Neurological: Negative.   Psychiatric/Behavioral: Negative.       Objective:   Physical Exam  Constitutional: She is oriented to person, place, and time. She appears well-developed and well-nourished.  HENT:  Head: Normocephalic and atraumatic.  Eyes: EOM are normal.  Neck: Normal range of motion.  Cardiovascular: Normal rate and regular rhythm.   Pulmonary/Chest: Effort normal and breath sounds normal. No respiratory distress. She has no wheezes. She has no rales.  Abdominal: Soft. Bowel sounds are normal. She exhibits no distension. There is no tenderness. There is no rebound.  Musculoskeletal: She exhibits no edema.  Neurological: She is alert and oriented to person, place, and time. Coordination normal.  Skin: Skin is warm and dry.   Filed Vitals:   08/30/14 1315  BP: 108/64  Pulse: 63  Temp: 98.5 F (36.9 C)  TempSrc: Oral  Resp: 12  Height: 5\' 1"  (1.549 m)  Weight: 158 lb 6.4 oz (71.85 kg)  SpO2: 99%      Assessment & Plan:

## 2014-09-01 NOTE — Assessment & Plan Note (Signed)
Recheck lipid panel today. She is currently not on medication but doing well with diet and exercise.

## 2015-10-13 LAB — HM PAP SMEAR

## 2015-10-16 ENCOUNTER — Encounter: Payer: Managed Care, Other (non HMO) | Admitting: Internal Medicine

## 2015-10-17 ENCOUNTER — Encounter: Payer: Self-pay | Admitting: Internal Medicine

## 2015-10-17 ENCOUNTER — Other Ambulatory Visit (INDEPENDENT_AMBULATORY_CARE_PROVIDER_SITE_OTHER): Payer: Managed Care, Other (non HMO)

## 2015-10-17 ENCOUNTER — Ambulatory Visit (INDEPENDENT_AMBULATORY_CARE_PROVIDER_SITE_OTHER): Payer: Managed Care, Other (non HMO) | Admitting: Internal Medicine

## 2015-10-17 VITALS — BP 122/62 | HR 60 | Temp 97.8°F | Resp 12 | Ht 61.0 in | Wt 163.0 lb

## 2015-10-17 DIAGNOSIS — Z0000 Encounter for general adult medical examination without abnormal findings: Secondary | ICD-10-CM

## 2015-10-17 LAB — LIPID PANEL
CHOLESTEROL: 220 mg/dL — AB (ref 0–200)
HDL: 61.9 mg/dL (ref >39.00)
LDL Cholesterol: 146 mg/dL — ABNORMAL HIGH (ref 0–99)
NonHDL: 158.03
TRIGLYCERIDES: 61 mg/dL (ref 0.0–149.0)
Total CHOL/HDL Ratio: 4
VLDL: 12.2 mg/dL (ref 0.0–40.0)

## 2015-10-17 LAB — COMPREHENSIVE METABOLIC PANEL
ALBUMIN: 4.1 g/dL (ref 3.5–5.2)
ALK PHOS: 53 U/L (ref 39–117)
ALT: 13 U/L (ref 0–35)
AST: 16 U/L (ref 0–37)
BUN: 14 mg/dL (ref 6–23)
CO2: 30 mEq/L (ref 19–32)
CREATININE: 0.73 mg/dL (ref 0.40–1.20)
Calcium: 9.6 mg/dL (ref 8.4–10.5)
Chloride: 101 mEq/L (ref 96–112)
GFR: 108.83 mL/min (ref >60.00)
Glucose, Bld: 82 mg/dL (ref 70–99)
POTASSIUM: 4.1 meq/L (ref 3.5–5.1)
SODIUM: 138 meq/L (ref 135–145)
TOTAL PROTEIN: 7.1 g/dL (ref 6.0–8.3)
Total Bilirubin: 0.4 mg/dL (ref 0.2–1.2)

## 2015-10-17 LAB — VITAMIN B12: VITAMIN B 12: 895 pg/mL (ref 211–911)

## 2015-10-17 LAB — TSH: TSH: 1.32 u[IU]/mL (ref 0.35–4.50)

## 2015-10-17 NOTE — Assessment & Plan Note (Signed)
Checking labs, counseled her about weight and exercise. Declines flu shot. Talked to her about colonoscopy when she turns 50 this fall.

## 2015-10-17 NOTE — Progress Notes (Signed)
   Subjective:    Patient ID: Abigail [name], female    DOB: 20-Jan-1966, 50 y.o.   MRN: [id number]  HPI The patient is a 50 YO female coming in for wellness. No new concerns. Her fiance passed unexpectedly over the summer and she is still grieving from that.   PMH, [facility], social history reviewed and updated.   Review of Systems  Constitutional: Negative for fever, diaphoresis, activity change, appetite change and fatigue.  HENT: Negative.   Eyes: Negative.   Respiratory: Negative for cough, choking, chest tightness, shortness of breath and wheezing.   Cardiovascular: Negative for chest pain, palpitations and leg swelling.  Gastrointestinal: Negative for nausea, abdominal pain, diarrhea, constipation and abdominal distention.  Genitourinary: Negative.   Musculoskeletal: Negative.   Skin: Negative.   Neurological: Negative.   Psychiatric/Behavioral: Negative.       Objective:   Physical Exam  Constitutional: She is oriented to person, place, and time. She appears well-developed and well-nourished.  HENT:  Head: Normocephalic and atraumatic.  Eyes: EOM are normal.  Neck: Normal range of motion.  Cardiovascular: Normal rate and regular rhythm.   Pulmonary/Chest: Effort normal and breath sounds normal. No respiratory distress. She has no wheezes. She has no rales.  Abdominal: Soft. Bowel sounds are normal. She exhibits no distension. There is no tenderness. There is no rebound.  Musculoskeletal: She exhibits no edema.  Neurological: She is alert and oriented to person, place, and time. Coordination normal.  Skin: Skin is warm and dry.   Filed Vitals:   10/17/15 1448  BP: 122/62  Pulse: 60  Temp: 97.8 F (36.6 C)  TempSrc: Oral  Resp: 12  Height: 5\' 1"  (1.549 m)  Weight: 163 lb (73.936 kg)      Assessment & Plan:

## 2015-10-17 NOTE — Progress Notes (Signed)
Pre visit review using our clinic review tool, if applicable. No additional management support is needed unless otherwise documented below in the visit note. 

## 2015-10-17 NOTE — Patient Instructions (Signed)
We are checking the blood work today and will call you about the results   Work on exercising 45 minutes per day 6 days per week to lose weight.   Come back in about 1 year and please feel free to call us before then with any problems or questions.   I am really sorry about your fiancee.   Exercising to Lose Weight Exercising can help you to lose weight. In order to lose weight through exercise, you need to do vigorous-intensity exercise. You can tell that you are exercising with vigorous intensity if you are breathing very hard and fast and cannot hold a conversation while exercising. Moderate-intensity exercise helps to maintain your current weight. You can tell that you are exercising at a moderate level if you have a higher heart rate and faster breathing, but you are still able to hold a conversation. HOW OFTEN SHOULD I EXERCISE? Choose an activity that you enjoy and set realistic goals. Your health care provider can help you to make an activity plan that works for you. Exercise regularly as directed by your health care provider. This may include:  Doing resistance training twice each week, such as:  Push-ups.  Sit-ups.  Lifting weights.  Using resistance bands.  Doing a given intensity of exercise for a given amount of time. Choose from these options:  150 minutes of moderate-intensity exercise every week.  75 minutes of vigorous-intensity exercise every week.  A mix of moderate-intensity and vigorous-intensity exercise every week. Children, pregnant women, people who are out of shape, people who are overweight, and older adults may need to consult a health care provider for individual recommendations. If you have any sort of medical condition, be sure to consult your health care provider before starting a new exercise program. WHAT ARE SOME ACTIVITIES THAT CAN HELP ME TO LOSE WEIGHT?   Walking at a rate of at least 4.5 miles an hour.  Jogging or running at a rate of 5 miles  per hour.  Biking at a rate of at least 10 miles per hour.  Lap swimming.  Roller-skating or in-line skating.  Cross-country skiing.  Vigorous competitive sports, such as football, basketball, and soccer.  Jumping rope.  Aerobic dancing. HOW CAN I BE MORE ACTIVE IN MY DAY-TO-DAY ACTIVITIES?  Use the stairs instead of the elevator.  Take a walk during your lunch break.  If you drive, park your car farther away from work or school.  If you take public transportation, get off one stop early and walk the rest of the way.  Make all of your phone calls while standing up and walking around.  Get up, stretch, and walk around every 30 minutes throughout the day. WHAT GUIDELINES SHOULD I FOLLOW WHILE EXERCISING?  Do not exercise so much that you hurt yourself, feel dizzy, or get very short of breath.  Consult your health care provider prior to starting a new exercise program.  Wear comfortable clothes and shoes with good support.  Drink plenty of water while you exercise to prevent dehydration or heat stroke. Body water is lost during exercise and must be replaced.  Work out until you breathe faster and your heart beats faster.   This information is not intended to replace advice given to you by your health care provider. Make sure you discuss any questions you have with your health care provider.   Document Released: 10/30/2010 Document Revised: 10/18/2014 Document Reviewed: 02/28/2014 Elsevier Interactive Patient Education 2016 [place] Sizes A  serving size is a measured amount of food or drink, such as one slice of bread, that has an associated nutrient content. Knowing the serving size of a food or drink can help you determine how much of that food you should consume.  WHAT IS THE SIZE OF ONE SERVING? The size of one healthy serving depends on the food or drink. To determine a serving size, read the food label. If the food or drink does not have a food label,  try to find serving size information online. Or, use the following to estimate the size of one adult serving:  Grain 1 slice bread.  bagel.  cup pasta.  Vegetable  cup cooked or canned vegetables. 1 cup raw, leafy greens.  Fruit  cup canned fruit. 1 medium fruit.  cup dried fruit.  Meat and Other Protein Sources 1 oz meat, poultry, or fish.  cup cooked beans. 1 egg.  cup nuts or seeds. 1 Tbsp nut butter.  cup tofu or tempeh. 2 Tbsp hummus.  Dairy An individual container of yogurt (6-8 oz). 1 piece of cheese the size of your thumb (1 oz). 1 cup (8 oz) milk or milk alternative.  Fat A piece the size of one dice. 1 tsp soft margarine. 1 Tbsp mayonnaise. 1 tsp vegetable oil. 1 Tbsp regular salad dressing. 2 Tbsp low-fat salad dressing.  HOW MANY SERVINGS SHOULD I EAT FROM EACH FOOD GROUP EACH DAY?  The following are the suggested number of servings to try and have every day from each food group. You can also look at your eating throughout the week and aim for meeting these requirements on most days for overall healthy eating.  Grain 6-8 servings. Try to have half of your grains from whole grains, such as whole wheat bread, corn tortillas, oatmeal, brown rice, whole wheat pasta, and bulgur. Vegetable At least 2-3 servings.  Fruit 2 servings.  Meat and Other Protein Foods 5-6 servings. Aim to have lean proteins, such as chicken, [place], fish, beans, or tofu. Dairy 3 servings. Choose low-fat or nonfat if you are trying to control your weight.  Fat 2-3 servings.  IS A SERVING THE SAME THING AS A PORTION? No. A portion is the actual amount you eat, which may be more than one serving. Knowing the specific serving size of a food and the nutritional information that goes with it can help you make a healthy decision on what size portion to eat.  WHAT ARE SOME TIPS TO HELP ME LEARN HEALTHY SERVING SIZES?  Check food labels for serving sizes. Many foods that come as a single portion actually  contain multiple servings.  Determine the serving size of foods you commonly eat and figure out how large a portion you usually eat.  Measure the number of servings that can be held by the bowls, glasses, cups, and plates you typically use. For example, pour your breakfast cereal into your regular bowl and then pour it into a measuring cup.  For 1-2 days, measure the serving sizes of all the foods you eat.  Practice estimating serving sizes and determining how big your portions should be.   This information is not intended to replace advice given to you by your health care provider. Make sure you discuss any questions you have with your health care provider.   Document Released: 06/26/2003 Document Revised: 10/18/2014 Document Reviewed: 12/25/2013 Elsevier Interactive Patient Education [employer].

## 2016-10-19 ENCOUNTER — Encounter: Payer: Managed Care, Other (non HMO) | Admitting: Internal Medicine

## 2016-11-01 ENCOUNTER — Encounter: Payer: Managed Care, Other (non HMO) | Admitting: Internal Medicine

## 2016-11-04 ENCOUNTER — Ambulatory Visit (INDEPENDENT_AMBULATORY_CARE_PROVIDER_SITE_OTHER): Payer: Managed Care, Other (non HMO) | Admitting: Internal Medicine

## 2016-11-04 ENCOUNTER — Other Ambulatory Visit (INDEPENDENT_AMBULATORY_CARE_PROVIDER_SITE_OTHER): Payer: Managed Care, Other (non HMO)

## 2016-11-04 ENCOUNTER — Encounter: Payer: Self-pay | Admitting: Internal Medicine

## 2016-11-04 VITALS — BP 118/72 | HR 86 | Temp 97.5°F | Resp 14 | Ht 61.0 in | Wt 159.0 lb

## 2016-11-04 DIAGNOSIS — E785 Hyperlipidemia, unspecified: Secondary | ICD-10-CM

## 2016-11-04 DIAGNOSIS — Z0000 Encounter for general adult medical examination without abnormal findings: Secondary | ICD-10-CM

## 2016-11-04 LAB — COMPREHENSIVE METABOLIC PANEL
ALBUMIN: 4.4 g/dL (ref 3.5–5.2)
ALK PHOS: 67 U/L (ref 39–117)
ALT: 12 U/L (ref 0–35)
AST: 16 U/L (ref 0–37)
BUN: 9 mg/dL (ref 6–23)
CALCIUM: 9.4 mg/dL (ref 8.4–10.5)
CO2: 30 mEq/L (ref 19–32)
Chloride: 103 mEq/L (ref 96–112)
Creatinine, Ser: 0.82 mg/dL (ref 0.40–1.20)
GFR: 94.76 mL/min (ref >60.00)
Glucose, Bld: 92 mg/dL (ref 70–99)
POTASSIUM: 3.9 meq/L (ref 3.5–5.1)
Sodium: 138 mEq/L (ref 135–145)
TOTAL PROTEIN: 7.8 g/dL (ref 6.0–8.3)
Total Bilirubin: 0.4 mg/dL (ref 0.2–1.2)

## 2016-11-04 LAB — VITAMIN D 25 HYDROXY (VIT D DEFICIENCY, FRACTURES): VITD: 20.58 ng/mL — ABNORMAL LOW (ref 30.00–100.00)

## 2016-11-04 LAB — CBC
HCT: 39 % (ref 36.0–46.0)
HEMOGLOBIN: 12.9 g/dL (ref 12.0–15.0)
MCHC: 33 g/dL (ref 30.0–36.0)
MCV: 87.6 fl (ref 78.0–100.0)
PLATELETS: 249 10*3/uL (ref 150.0–400.0)
RBC: 4.45 Mil/uL (ref 3.87–5.11)
RDW: 12.8 % (ref 11.5–15.5)
WBC: 5.2 10*3/uL (ref 4.0–10.5)

## 2016-11-04 LAB — LIPID PANEL
CHOLESTEROL: 238 mg/dL — AB (ref 0–200)
HDL: 66.9 mg/dL (ref >39.00)
LDL Cholesterol: 153 mg/dL — ABNORMAL HIGH (ref 0–99)
NonHDL: 170.96
Total CHOL/HDL Ratio: 4
Triglycerides: 92 mg/dL (ref 0.0–149.0)
VLDL: 18.4 mg/dL (ref 0.0–40.0)

## 2016-11-04 LAB — TSH: TSH: 1.35 u[IU]/mL (ref 0.35–4.50)

## 2016-11-04 NOTE — Progress Notes (Signed)
Pre visit review using our clinic review tool, if applicable. No additional management support is needed unless otherwise documented below in the visit note. 

## 2016-11-04 NOTE — Patient Instructions (Addendum)
We are checking the labs today.   Think about getting a colonoscopy to check for colon cancer. Call us if you want to do that.    Colonoscopy, Adult A colonoscopy is an exam to look at the entire large intestine. During the exam, a lubricated, bendable tube is inserted into the anus and then passed into the rectum, colon, and other parts of the large intestine. A colonoscopy is often done as a part of normal colorectal screening or in response to certain symptoms, such as anemia, persistent diarrhea, abdominal pain, and blood in the stool. The exam can help screen for and diagnose medical problems, including:  Tumors.  Polyps.  Inflammation.  Areas of bleeding. Tell a health care provider about:  Any allergies you have.  All medicines you are taking, including vitamins, herbs, eye drops, creams, and over-the-counter medicines.  Any problems you or family members have had with anesthetic medicines.  Any blood disorders you have.  Any surgeries you have had.  Any medical conditions you have.  Any problems you have had passing stool. What are the risks? Generally, this is a safe procedure. However, problems may occur, including:  Bleeding.  A tear in the intestine.  A reaction to medicines given during the exam.  Infection (rare). What happens before the procedure? Eating and drinking restrictions  Follow instructions from your health care provider about eating and drinking, which may include:  A few days before the procedure - follow a low-fiber diet. Avoid nuts, seeds, dried fruit, raw fruits, and vegetables.  1-3 days before the procedure - follow a clear liquid diet. Drink only clear liquids, such as clear broth or bouillon, black coffee or tea, clear juice, clear soft drinks or sports drinks, gelatin desert, and popsicles. Avoid any liquids that contain red or purple dye.  On the day of the procedure - do not eat or drink anything during the 2 hours before the  procedure, or within the time period that your health care provider recommends. Bowel prep  If you were prescribed an oral bowel prep to clean out your colon:  Take it as told by your health care provider. Starting the day before your procedure, you will need to drink a large amount of medicated liquid. The liquid will cause you to have multiple loose stools until your stool is almost clear or light green.  If your skin or anus gets irritated from diarrhea, you may use these to relieve the irritation:  Medicated wipes, such as adult wet wipes with aloe and vitamin E.  A skin soothing-product like petroleum jelly.  If you vomit while drinking the bowel prep, take a break for up to 60 minutes and then begin the bowel prep again. If vomiting continues and you cannot take the bowel prep without vomiting, call your health care provider. General instructions  Ask your health care provider about changing or stopping your regular medicines. This is especially important if you are taking diabetes medicines or blood thinners.  Plan to have someone take you home from the hospital or clinic. What happens during the procedure?  An IV tube may be inserted into one of your veins.  You will be given medicine to help you relax (sedative).  To reduce your risk of infection:  Your health care team will wash or sanitize their hands.  Your anal area will be washed with soap.  You will be asked to lie on your side with your knees bent.  Your health care provider  will lubricate a long, thin, flexible tube. The tube will have a camera and a light on the end.  The tube will be inserted into your anus.  The tube will be gently eased through your rectum and colon.  Air will be delivered into your colon to keep it open. You may feel some pressure or cramping.  The camera will be used to take images during the procedure.  A small tissue sample may be removed from your body to be examined under a  microscope (biopsy). If any potential problems are found, the tissue will be sent to a lab for testing.  If small polyps are found, your health care provider may remove them and have them checked for cancer cells.  The tube that was inserted into your anus will be slowly removed. The procedure may vary among health care providers and hospitals. What happens after the procedure?  Your blood pressure, heart rate, breathing rate, and blood oxygen level will be monitored until the medicines you were given have worn off.  Do not drive for 24 hours after the exam.  You may have a small amount of blood in your stool.  You may pass gas and have mild abdominal cramping or bloating due to the air that was used to inflate your colon during the exam.  It is up to you to get the results of your procedure. Ask your health care provider, or the department performing the procedure, when your results will be ready. This information is not intended to replace advice given to you by your health care provider. Make sure you discuss any questions you have with your health care provider. Document Released: 09/24/2000 Document Revised: 04/16/2016 Document Reviewed: 12/09/2015 Elsevier Interactive Patient Education  2017 [place] [employer], Female Introduction Adopting a healthy lifestyle and getting preventive care can go a long way to promote health and wellness. Talk with your health care provider about what schedule of regular examinations is right for you. This is a good chance for you to check in with your provider about disease prevention and staying healthy. In between checkups, there are plenty of things you can do on your own. Experts have done a lot of research about which lifestyle changes and preventive measures are most likely to keep you healthy. Ask your health care provider for more information. Weight and diet Eat a healthy diet  Be sure to include plenty of vegetables, fruits,  low-fat dairy products, and lean protein.  Do not eat a lot of foods high in solid fats, added sugars, or salt.  Get regular exercise. This is one of the most important things you can do for your health.  Most adults should exercise for at least 150 minutes each week. The exercise should increase your heart rate and make you sweat (moderate-intensity exercise).  Most adults should also do strengthening exercises at least twice a week. This is in addition to the moderate-intensity exercise. Maintain a healthy weight  Body mass index (BMI) is a measurement that can be used to identify possible weight problems. It estimates body fat based on height and weight. Your health care provider can help determine your BMI and help you achieve or maintain a healthy weight.  For females 25 years of age and older:  A BMI below 18.5 is considered underweight.  A BMI of 18.5 to 24.9 is normal.  A BMI of 25 to 29.9 is considered overweight.  A BMI of 30 and above is considered obese.  Watch levels of cholesterol and blood lipids  You should start having your blood tested for lipids and cholesterol at 51 years of age, then have this test every 5 years.  You may need to have your cholesterol levels checked more often if:  Your lipid or cholesterol levels are high.  You are older than 51 years of age.  You are at high risk for heart disease. Cancer screening Lung Cancer  Lung cancer screening is recommended for adults 37-35 years old who are at high risk for lung cancer because of a history of smoking.  A yearly low-dose CT scan of the lungs is recommended for people who:  Currently smoke.  Have quit within the past 15 years.  Have at least a 30-pack-year history of smoking. A pack year is smoking an average of one pack of cigarettes a day for 1 year.  Yearly screening should continue until it has been 15 years since you quit.  Yearly screening should stop if you develop a health problem  that would prevent you from having lung cancer treatment. Breast Cancer  Practice breast self-awareness. This means understanding how your breasts normally appear and feel.  It also means doing regular breast self-exams. Let your health care provider know about any changes, no matter how small.  If you are in your 20s or 30s, you should have a clinical breast exam (CBE) by a health care provider every 1-3 years as part of a regular health exam.  If you are 71 or older, have a CBE every year. Also consider having a breast X-ray (mammogram) every year.  If you have a family history of breast cancer, talk to your health care provider about genetic screening.  If you are at high risk for breast cancer, talk to your health care provider about having an MRI and a mammogram every year.  Breast cancer gene (BRCA) assessment is recommended for women who have family members with BRCA-related cancers. BRCA-related cancers include:  Breast.  Ovarian.  Tubal.  Peritoneal cancers.  Results of the assessment will determine the need for genetic counseling and BRCA1 and BRCA2 testing. Cervical Cancer  Your health care provider may recommend that you be screened regularly for cancer of the pelvic organs (ovaries, uterus, and vagina). This screening involves a pelvic examination, including checking for microscopic changes to the surface of your cervix (Pap test). You may be encouraged to have this screening done every 3 years, beginning at age 8.  For women ages 82-65, health care providers may recommend pelvic exams and Pap testing every 3 years, or they may recommend the Pap and pelvic exam, combined with testing for human papilloma virus (HPV), every 5 years. Some types of HPV increase your risk of cervical cancer. Testing for HPV may also be done on women of any age with unclear Pap test results.  Other health care providers may not recommend any screening for nonpregnant women who are considered  low risk for pelvic cancer and who do not have symptoms. Ask your health care provider if a screening pelvic exam is right for you.  If you have had past treatment for cervical cancer or a condition that could lead to cancer, you need Pap tests and screening for cancer for at least 20 years after your treatment. If Pap tests have been discontinued, your risk factors (such as having a new sexual partner) need to be reassessed to determine if screening should resume. Some women have medical problems that increase the chance  of getting cervical cancer. In these cases, your health care provider may recommend more frequent screening and Pap tests. Colorectal Cancer  This type of cancer can be detected and often prevented.  Routine colorectal cancer screening usually begins at 51 years of age and continues through 51 years of age.  Your health care provider may recommend screening at an earlier age if you have risk factors for colon cancer.  Your health care provider may also recommend using home test kits to check for hidden blood in the stool.  A small camera at the end of a tube can be used to examine your colon directly (sigmoidoscopy or colonoscopy). This is done to check for the earliest forms of colorectal cancer.  Routine screening usually begins at age 46.  Direct examination of the colon should be repeated every 5-10 years through 51 years of age. However, you may need to be screened more often if early forms of precancerous polyps or small growths are found. Skin Cancer  Check your skin from head to toe regularly.  Tell your health care provider about any new moles or changes in moles, especially if there is a change in a mole's shape or color.  Also tell your health care provider if you have a mole that is larger than the size of a pencil eraser.  Always use sunscreen. Apply sunscreen liberally and repeatedly throughout the day.  Protect yourself by wearing long sleeves, pants, a  wide-brimmed hat, and sunglasses whenever you are outside. Heart disease, diabetes, and high blood pressure  High blood pressure causes heart disease and increases the risk of stroke. High blood pressure is more likely to develop in:  People who have blood pressure in the high end of the normal range (130-139/85-89 mm Hg).  People who are overweight or obese.  People who are African American.  If you are 58-78 years of age, have your blood pressure checked every 3-5 years. If you are 35 years of age or older, have your blood pressure checked every year. You should have your blood pressure measured twice-once when you are at a hospital or clinic, and once when you are not at a hospital or clinic. Record the average of the two measurements. To check your blood pressure when you are not at a hospital or clinic, you can use:  An automated blood pressure machine at a pharmacy.  A home blood pressure monitor.  If you are between 34 years and 59 years old, ask your health care provider if you should take aspirin to prevent strokes.  Have regular diabetes screenings. This involves taking a blood sample to check your fasting blood sugar level.  If you are at a normal weight and have a low risk for diabetes, have this test once every three years after 51 years of age.  If you are overweight and have a high risk for diabetes, consider being tested at a younger age or more often. Preventing infection Hepatitis B  If you have a higher risk for hepatitis B, you should be screened for this virus. You are considered at high risk for hepatitis B if:  You were born in a country where hepatitis B is common. Ask your health care provider which countries are considered high risk.  Your parents were born in a high-risk country, and you have not been immunized against hepatitis B (hepatitis B vaccine).  You have HIV or AIDS.  You use needles to inject street drugs.  You live with someone  who has  hepatitis B.  You have had sex with someone who has hepatitis B.  You get hemodialysis treatment.  You take certain medicines for conditions, including cancer, organ transplantation, and autoimmune conditions. Hepatitis C  Blood testing is recommended for:  Everyone born from [birth year] through 1965.  Anyone with known risk factors for hepatitis C. Sexually transmitted infections (STIs)  You should be screened for sexually transmitted infections (STIs) including gonorrhea and chlamydia if:  You are sexually active and are younger than 51 years of age.  You are older than 51 years of age and your health care provider tells you that you are at risk for this type of infection.  Your sexual activity has changed since you were last screened and you are at an increased risk for chlamydia or gonorrhea. Ask your health care provider if you are at risk.  If you do not have HIV, but are at risk, it may be recommended that you take a prescription medicine daily to prevent HIV infection. This is called pre-exposure prophylaxis (PrEP). You are considered at risk if:  You are sexually active and do not regularly use condoms or know the HIV status of your partner(s).  You take drugs by injection.  You are sexually active with a partner who has HIV. Talk with your health care provider about whether you are at high risk of being infected with HIV. If you choose to begin PrEP, you should first be tested for HIV. You should then be tested every 3 months for as long as you are taking PrEP. Pregnancy  If you are premenopausal and you may become pregnant, ask your health care provider about preconception counseling.  If you may become pregnant, take 400 to 800 micrograms (mcg) of folic acid every day.  If you want to prevent pregnancy, talk to your health care provider about birth control (contraception). Osteoporosis and menopause  Osteoporosis is a disease in which the bones lose minerals and strength  with aging. This can result in serious bone fractures. Your risk for osteoporosis can be identified using a bone density scan.  If you are 66 years of age or older, or if you are at risk for osteoporosis and fractures, ask your health care provider if you should be screened.  Ask your health care provider whether you should take a calcium or vitamin D supplement to lower your risk for osteoporosis.  Menopause may have certain physical symptoms and risks.  Hormone replacement therapy may reduce some of these symptoms and risks. Talk to your health care provider about whether hormone replacement therapy is right for you. Follow these instructions at home:  Schedule regular health, dental, and eye exams.  Stay current with your immunizations.  Do not use any tobacco products including cigarettes, chewing tobacco, or electronic cigarettes.  If you are pregnant, do not drink alcohol.  If you are breastfeeding, limit how much and how often you drink alcohol.  Limit alcohol intake to no more than 1 drink per day for nonpregnant women. One drink equals 12 ounces of beer, 5 ounces of wine, or 1 ounces of hard liquor.  Do not use street drugs.  Do not share needles.  Ask your health care provider for help if you need support or information about quitting drugs.  Tell your health care provider if you often feel depressed.  Tell your health care provider if you have ever been abused or do not feel safe at home. This information is not intended  to replace advice given to you by your health care provider. Make sure you discuss any questions you have with your health care provider. Document Released: 04/12/2011 Document Revised: 03/04/2016 Document Reviewed: 07/01/2015  2017 [employer]vier

## 2016-11-04 NOTE — Assessment & Plan Note (Signed)
Checking labs today. Counseled about colon cancer screening and she will read about it and think about it. Counseled about exercise. Does not want mammogram today. Declines flu shot. Tetanus up to date. Given screening recommendations.

## 2016-11-04 NOTE — Progress Notes (Signed)
   Subjective:    Patient ID: Darlina Guys, female    DOB: 1966/07/11, 51 y.o.   MRN: [id number]  HPI The patient is a 51 YO female coming in for wellness. No new concerns.   PMH, [facility], social history reviewed and updated.   Review of Systems  Constitutional: Negative.   HENT: Negative.   Eyes: Negative.   Respiratory: Negative for cough, chest tightness and shortness of breath.   Cardiovascular: Negative for chest pain, palpitations and leg swelling.  Gastrointestinal: Negative for abdominal distention, abdominal pain, constipation, diarrhea, nausea and vomiting.  Musculoskeletal: Negative.   Skin: Negative.   Neurological: Negative.   Psychiatric/Behavioral: Negative.       Objective:   Physical Exam  Constitutional: She is oriented to person, place, and time. She appears well-developed and well-nourished.  HENT:  Head: Normocephalic and atraumatic.  Eyes: EOM are normal.  Neck: Normal range of motion.  Cardiovascular: Normal rate and regular rhythm.   Pulmonary/Chest: Effort normal and breath sounds normal. No respiratory distress. She has no wheezes. She has no rales.  Abdominal: Soft. Bowel sounds are normal. She exhibits no distension. There is no tenderness. There is no rebound.  Musculoskeletal: She exhibits no edema.  Neurological: She is alert and oriented to person, place, and time. Coordination normal.  Skin: Skin is warm and dry.  Psychiatric: She has a normal mood and affect.   Vitals:   11/04/16 1447  BP: 118/72  Pulse: 86  Resp: 14  Temp: 97.5 F (36.4 C)  TempSrc: Oral  Weight: 159 lb (72.1 kg)  Height: 5\' 1"  (1.549 m)     Assessment & Plan:

## 2016-11-04 NOTE — Assessment & Plan Note (Signed)
Checking lipid panel for goal LDL <130.

## 2017-11-22 ENCOUNTER — Encounter: Payer: Self-pay | Admitting: Internal Medicine

## 2017-11-22 ENCOUNTER — Ambulatory Visit (INDEPENDENT_AMBULATORY_CARE_PROVIDER_SITE_OTHER): Payer: 59 | Admitting: Internal Medicine

## 2017-11-22 ENCOUNTER — Other Ambulatory Visit (INDEPENDENT_AMBULATORY_CARE_PROVIDER_SITE_OTHER): Payer: 59

## 2017-11-22 VITALS — BP 124/88 | HR 84 | Temp 97.5°F | Ht 61.0 in | Wt 160.0 lb

## 2017-11-22 DIAGNOSIS — Z0000 Encounter for general adult medical examination without abnormal findings: Secondary | ICD-10-CM

## 2017-11-22 DIAGNOSIS — E785 Hyperlipidemia, unspecified: Secondary | ICD-10-CM | POA: Diagnosis not present

## 2017-11-22 LAB — CBC
HEMATOCRIT: 37.5 % (ref 36.0–46.0)
Hemoglobin: 12.1 g/dL (ref 12.0–15.0)
MCHC: 32.3 g/dL (ref 30.0–36.0)
MCV: 89 fl (ref 78.0–100.0)
Platelets: 306 10*3/uL (ref 150.0–400.0)
RBC: 4.21 Mil/uL (ref 3.87–5.11)
RDW: 13 % (ref 11.5–15.5)
WBC: 5.1 10*3/uL (ref 4.0–10.5)

## 2017-11-22 LAB — LIPID PANEL
CHOLESTEROL: 243 mg/dL — AB (ref 0–200)
HDL: 55.6 mg/dL (ref >39.00)
LDL Cholesterol: 168 mg/dL — ABNORMAL HIGH (ref 0–99)
NonHDL: 187.39
TRIGLYCERIDES: 98 mg/dL (ref 0.0–149.0)
Total CHOL/HDL Ratio: 4
VLDL: 19.6 mg/dL (ref 0.0–40.0)

## 2017-11-22 LAB — COMPREHENSIVE METABOLIC PANEL
ALBUMIN: 4.1 g/dL (ref 3.5–5.2)
ALK PHOS: 68 U/L (ref 39–117)
ALT: 16 U/L (ref 0–35)
AST: 16 U/L (ref 0–37)
BUN: 14 mg/dL (ref 6–23)
CO2: 32 mEq/L (ref 19–32)
CREATININE: 0.78 mg/dL (ref 0.40–1.20)
Calcium: 9.4 mg/dL (ref 8.4–10.5)
Chloride: 102 mEq/L (ref 96–112)
GFR: 99.97 mL/min (ref >60.00)
Glucose, Bld: 86 mg/dL (ref 70–99)
Potassium: 4.2 mEq/L (ref 3.5–5.1)
SODIUM: 139 meq/L (ref 135–145)
TOTAL PROTEIN: 7.2 g/dL (ref 6.0–8.3)
Total Bilirubin: 0.4 mg/dL (ref 0.2–1.2)

## 2017-11-22 LAB — HEMOGLOBIN A1C: HEMOGLOBIN A1C: 5.3 % (ref 4.6–6.5)

## 2017-11-22 NOTE — Assessment & Plan Note (Signed)
Advised to get mammogram with pap smear next week with gyn and she will. Talked about cologuard and colonoscopy and she will talk to insurance about cologuard. Declines HIV screening. Declines flu. Tetanus up to date. Counseled about shingrix and she declines. Counseled about sun safety and dangers of distracted driving. Given screening recommendations.

## 2017-11-22 NOTE — Progress Notes (Signed)
   Subjective:    Patient ID: Abigail [name], female    DOB: Nov 12, 1965, 52 y.o.   MRN: [id number]  HPI The patient is a 52 YO female coming in for physical. No new concerns.   PMH, [facility], social history reviewed and updated.   Review of Systems  Constitutional: Negative.   HENT: Negative.   Eyes: Negative.   Respiratory: Negative for cough, chest tightness and shortness of breath.   Cardiovascular: Negative for chest pain, palpitations and leg swelling.  Gastrointestinal: Negative for abdominal distention, abdominal pain, constipation, diarrhea, nausea and vomiting.  Musculoskeletal: Negative.   Skin: Negative.   Neurological: Negative.   Psychiatric/Behavioral: Negative.       Objective:   Physical Exam  Constitutional: She is oriented to person, place, and time. She appears well-developed and well-nourished.  HENT:  Head: Normocephalic and atraumatic.  Eyes: EOM are normal.  Neck: Normal range of motion.  Cardiovascular: Normal rate and regular rhythm.  Pulmonary/Chest: Effort normal and breath sounds normal. No respiratory distress. She has no wheezes. She has no rales.  Abdominal: Soft. Bowel sounds are normal. She exhibits no distension. There is no tenderness. There is no rebound.  Musculoskeletal: She exhibits no edema.  Neurological: She is alert and oriented to person, place, and time. Coordination normal.  Skin: Skin is warm and dry.  Psychiatric: She has a normal mood and affect.   Vitals:   11/22/17 1507  BP: 124/88  Pulse: 84  Temp: (!) 97.5 F (36.4 C)  TempSrc: Oral  Weight: 160 lb (72.6 kg)  Height: 5\' 1"  (1.549 m)      Assessment & Plan:

## 2017-11-22 NOTE — Assessment & Plan Note (Signed)
Controlled by diet and checking lipid panel today. Adjust as needed. She is wanting to work on weight loss and given information about calorie counting.

## 2017-11-22 NOTE — Patient Instructions (Addendum)
Ask the insurance company about cologuard. If this is covered it is a colon cancer screening you can do from home. If you can do this call us or send a mychart message and we will get it sent to the home.   Think about getting the mammogram done at the gynecologist's office.    Calorie Counting for Weight Loss Calories are units of energy. Your body needs a certain amount of calories from food to keep you going throughout the day. When you eat more calories than your body needs, your body stores the extra calories as fat. When you eat fewer calories than your body needs, your body burns fat to get the energy it needs. Calorie counting means keeping track of how many calories you eat and drink each day. Calorie counting can be helpful if you need to lose weight. If you make sure to eat fewer calories than your body needs, you should lose weight. Ask your health care provider what a healthy weight is for you. For calorie counting to work, you will need to eat the right number of calories in a day in order to lose a healthy amount of weight per week. A dietitian can help you determine how many calories you need in a day and will give you suggestions on how to reach your calorie goal.  A healthy amount of weight to lose per week is usually 1-2 lb (0.5-0.9 kg). This usually means that your daily calorie intake should be reduced by 500-750 calories.  Eating 1,200 - 1,500 calories per day can help most women lose weight.  Eating 1,500 - 1,800 calories per day can help most men lose weight.  What is my plan? My goal is to have __________ calories per day. If I have this many calories per day, I should lose around __________ pounds per week. What do I need to know about calorie counting? In order to meet your daily calorie goal, you will need to:  Find out how many calories are in each food you would like to eat. Try to do this before you eat.  Decide how much of the food you plan to eat.  Write  down what you ate and how many calories it had. Doing this is called keeping a food log.  To successfully lose weight, it is important to balance calorie counting with a healthy lifestyle that includes regular activity. Aim for 150 minutes of moderate exercise (such as walking) or 75 minutes of vigorous exercise (such as running) each week. Where do I find calorie information?  The number of calories in a food can be found on a Nutrition Facts label. If a food does not have a Nutrition Facts label, try to look up the calories online or ask your dietitian for help. Remember that calories are listed per serving. If you choose to have more than one serving of a food, you will have to multiply the calories per serving by the amount of servings you plan to eat. For example, the label on a package of bread might say that a serving size is 1 slice and that there are 90 calories in a serving. If you eat 1 slice, you will have eaten 90 calories. If you eat 2 slices, you will have eaten 180 calories. How do I keep a food log? Immediately after each meal, record the following information in your food log:  What you ate. Don't forget to include toppings, sauces, and other extras on the food.  How much you ate. This can be measured in cups, ounces, or number of items.  How many calories each food and drink had.  The total number of calories in the meal.  Keep your food log near you, such as in a small notebook in your pocket, or use a mobile app or website. Some programs will calculate calories for you and show you how many calories you have left for the day to meet your goal. What are some calorie counting tips?  Use your calories on foods and drinks that will fill you up and not leave you hungry: ? Some examples of foods that fill you up are nuts and nut butters, vegetables, lean proteins, and high-fiber foods like whole grains. High-fiber foods are foods with more than 5 g fiber per serving. ? Drinks  such as sodas, specialty coffee drinks, alcohol, and juices have a lot of calories, yet do not fill you up.  Eat nutritious foods and avoid empty calories. Empty calories are calories you get from foods or beverages that do not have many vitamins or protein, such as candy, sweets, and soda. It is better to have a nutritious high-calorie food (such as an avocado) than a food with few nutrients (such as a bag of chips).  Know how many calories are in the foods you eat most often. This will help you calculate calorie counts faster.  Pay attention to calories in drinks. Low-calorie drinks include water and unsweetened drinks.  Pay attention to nutrition labels for "low fat" or "fat free" foods. These foods sometimes have the same amount of calories or more calories than the full fat versions. They also often have added sugar, starch, or salt, to make up for flavor that was removed with the fat.  Find a way of tracking calories that works for you. Get creative. Try different apps or programs if writing down calories does not work for you. What are some portion control tips?  Know how many calories are in a serving. This will help you know how many servings of a certain food you can have.  Use a measuring cup to measure serving sizes. You could also try weighing out portions on a kitchen scale. With time, you will be able to estimate serving sizes for some foods.  Take some time to put servings of different foods on your favorite plates, bowls, and cups so you know what a serving looks like.  Try not to eat straight from a bag or box. Doing this can lead to overeating. Put the amount you would like to eat in a cup or on a plate to make sure you are eating the right portion.  Use smaller plates, glasses, and bowls to prevent overeating.  Try not to multitask (for example, watch TV or use your computer) while eating. If it is time to eat, sit down at a table and enjoy your food. This will help you to  know when you are full. It will also help you to be aware of what you are eating and how much you are eating. What are tips for following this plan? Reading food labels  Check the calorie count compared to the serving size. The serving size may be smaller than what you are used to eating.  Check the source of the calories. Make sure the food you are eating is high in vitamins and protein and low in saturated and trans fats. Shopping  Read nutrition labels while you shop. This will help  you make healthy decisions before you decide to purchase your food.  Make a grocery list and stick to it. Cooking  Try to cook your favorite foods in a healthier way. For example, try baking instead of frying.  Use low-fat dairy products. Meal planning  Use more fruits and vegetables. Half of your plate should be fruits and vegetables.  Include lean proteins like poultry and fish. How do I count calories when eating out?  Ask for smaller portion sizes.  Consider sharing an entree and sides instead of getting your own entree.  If you get your own entree, eat only half. Ask for a box at the beginning of your meal and put the rest of your entree in it so you are not tempted to eat it.  If calories are listed on the menu, choose the lower calorie options.  Choose dishes that include vegetables, fruits, whole grains, low-fat dairy products, and lean protein.  Choose items that are boiled, broiled, grilled, or steamed. Stay away from items that are buttered, battered, fried, or served with cream sauce. Items labeled "crispy" are usually fried, unless stated otherwise.  Choose water, low-fat milk, unsweetened iced tea, or other drinks without added sugar. If you want an alcoholic beverage, choose a lower calorie option such as a glass of wine or light beer.  Ask for dressings, sauces, and syrups on the side. These are usually high in calories, so you should limit the amount you eat.  If you want a  salad, choose a garden salad and ask for grilled meats. Avoid extra toppings like bacon, cheese, or fried items. Ask for the dressing on the side, or ask for olive oil and vinegar or lemon to use as dressing.  Estimate how many servings of a food you are given. For example, a serving of cooked rice is  cup or about the size of half a baseball. Knowing serving sizes will help you be aware of how much food you are eating at restaurants. The list below tells you how big or small some common portion sizes are based on everyday objects: ? 1 oz-4 stacked dice. ? 3 oz-1 deck of cards. ? 1 tsp-1 die. ? 1 Tbsp- a ping-pong ball. ? 2 Tbsp-1 ping-pong ball. ?  cup- baseball. ? 1 cup-1 baseball. Summary  Calorie counting means keeping track of how many calories you eat and drink each day. If you eat fewer calories than your body needs, you should lose weight.  A healthy amount of weight to lose per week is usually 1-2 lb (0.5-0.9 kg). This usually means reducing your daily calorie intake by 500-750 calories.  The number of calories in a food can be found on a Nutrition Facts label. If a food does not have a Nutrition Facts label, try to look up the calories online or ask your dietitian for help.  Use your calories on foods and drinks that will fill you up, and not on foods and drinks that will leave you hungry.  Use smaller plates, glasses, and bowls to prevent overeating. This information is not intended to replace advice given to you by your health care provider. Make sure you discuss any questions you have with your health care provider. Document Released: 09/27/2005 Document Revised: 08/27/2016 Document Reviewed: 08/27/2016 Elsevier Interactive Patient Education  2018 [place] Maintenance, Female Adopting a healthy lifestyle and getting preventive care can go a long way to promote health and wellness. Talk with your health care provider about  what schedule of regular examinations  is right for you. This is a good chance for you to check in with your provider about disease prevention and staying healthy. In between checkups, there are plenty of things you can do on your own. Experts have done a lot of research about which lifestyle changes and preventive measures are most likely to keep you healthy. Ask your health care provider for more information. Weight and diet Eat a healthy diet  Be sure to include plenty of vegetables, fruits, low-fat dairy products, and lean protein.  Do not eat a lot of foods high in solid fats, added sugars, or salt.  Get regular exercise. This is one of the most important things you can do for your health. ? Most adults should exercise for at least 150 minutes each week. The exercise should increase your heart rate and make you sweat (moderate-intensity exercise). ? Most adults should also do strengthening exercises at least twice a week. This is in addition to the moderate-intensity exercise.  Maintain a healthy weight  Body mass index (BMI) is a measurement that can be used to identify possible weight problems. It estimates body fat based on height and weight. Your health care provider can help determine your BMI and help you achieve or maintain a healthy weight.  For females 63 years of age and older: ? A BMI below 18.5 is considered underweight. ? A BMI of 18.5 to 24.9 is normal. ? A BMI of 25 to 29.9 is considered overweight. ? A BMI of 30 and above is considered obese.  Watch levels of cholesterol and blood lipids  You should start having your blood tested for lipids and cholesterol at 52 years of age, then have this test every 5 years.  You may need to have your cholesterol levels checked more often if: ? Your lipid or cholesterol levels are high. ? You are older than 52 years of age. ? You are at high risk for heart disease.  Cancer screening Lung Cancer  Lung cancer screening is recommended for adults 19-7 years old who  are at high risk for lung cancer because of a history of smoking.  A yearly low-dose CT scan of the lungs is recommended for people who: ? Currently smoke. ? Have quit within the past 15 years. ? Have at least a 30-pack-year history of smoking. A pack year is smoking an average of one pack of cigarettes a day for 1 year.  Yearly screening should continue until it has been 15 years since you quit.  Yearly screening should stop if you develop a health problem that would prevent you from having lung cancer treatment.  Breast Cancer  Practice breast self-awareness. This means understanding how your breasts normally appear and feel.  It also means doing regular breast self-exams. Let your health care provider know about any changes, no matter how small.  If you are in your 20s or 30s, you should have a clinical breast exam (CBE) by a health care provider every 1-3 years as part of a regular health exam.  If you are 50 or older, have a CBE every year. Also consider having a breast X-ray (mammogram) every year.  If you have a family history of breast cancer, talk to your health care provider about genetic screening.  If you are at high risk for breast cancer, talk to your health care provider about having an MRI and a mammogram every year.  Breast cancer gene (BRCA) assessment is recommended for  women who have family members with BRCA-related cancers. BRCA-related cancers include: ? Breast. ? Ovarian. ? Tubal. ? Peritoneal cancers.  Results of the assessment will determine the need for genetic counseling and BRCA1 and BRCA2 testing.  Cervical Cancer Your health care provider may recommend that you be screened regularly for cancer of the pelvic organs (ovaries, uterus, and vagina). This screening involves a pelvic examination, including checking for microscopic changes to the surface of your cervix (Pap test). You may be encouraged to have this screening done every 3 years, beginning at age  49.  For women ages 22-65, health care providers may recommend pelvic exams and Pap testing every 3 years, or they may recommend the Pap and pelvic exam, combined with testing for human papilloma virus (HPV), every 5 years. Some types of HPV increase your risk of cervical cancer. Testing for HPV may also be done on women of any age with unclear Pap test results.  Other health care providers may not recommend any screening for nonpregnant women who are considered low risk for pelvic cancer and who do not have symptoms. Ask your health care provider if a screening pelvic exam is right for you.  If you have had past treatment for cervical cancer or a condition that could lead to cancer, you need Pap tests and screening for cancer for at least 20 years after your treatment. If Pap tests have been discontinued, your risk factors (such as having a new sexual partner) need to be reassessed to determine if screening should resume. Some women have medical problems that increase the chance of getting cervical cancer. In these cases, your health care provider may recommend more frequent screening and Pap tests.  Colorectal Cancer  This type of cancer can be detected and often prevented.  Routine colorectal cancer screening usually begins at 52 years of age and continues through 52 years of age.  Your health care provider may recommend screening at an earlier age if you have risk factors for colon cancer.  Your health care provider may also recommend using home test kits to check for hidden blood in the stool.  A small camera at the end of a tube can be used to examine your colon directly (sigmoidoscopy or colonoscopy). This is done to check for the earliest forms of colorectal cancer.  Routine screening usually begins at age 7.  Direct examination of the colon should be repeated every 5-10 years through 52 years of age. However, you may need to be screened more often if early forms of precancerous polyps  or small growths are found.  Skin Cancer  Check your skin from head to toe regularly.  Tell your health care provider about any new moles or changes in moles, especially if there is a change in a mole's shape or color.  Also tell your health care provider if you have a mole that is larger than the size of a pencil eraser.  Always use sunscreen. Apply sunscreen liberally and repeatedly throughout the day.  Protect yourself by wearing long sleeves, pants, a wide-brimmed hat, and sunglasses whenever you are outside.  Heart disease, diabetes, and high blood pressure  High blood pressure causes heart disease and increases the risk of stroke. High blood pressure is more likely to develop in: ? People who have blood pressure in the high end of the normal range (130-139/85-89 mm Hg). ? People who are overweight or obese. ? People who are African American.  If you are 57-69 years of age,  have your blood pressure checked every 3-5 years. If you are 37 years of age or older, have your blood pressure checked every year. You should have your blood pressure measured twice-once when you are at a hospital or clinic, and once when you are not at a hospital or clinic. Record the average of the two measurements. To check your blood pressure when you are not at a hospital or clinic, you can use: ? An automated blood pressure machine at a pharmacy. ? A home blood pressure monitor.  If you are between 60 years and 55 years old, ask your health care provider if you should take aspirin to prevent strokes.  Have regular diabetes screenings. This involves taking a blood sample to check your fasting blood sugar level. ? If you are at a normal weight and have a low risk for diabetes, have this test once every three years after 52 years of age. ? If you are overweight and have a high risk for diabetes, consider being tested at a younger age or more often. Preventing infection Hepatitis B  If you have a higher  risk for hepatitis B, you should be screened for this virus. You are considered at high risk for hepatitis B if: ? You were born in a country where hepatitis B is common. Ask your health care provider which countries are considered high risk. ? Your parents were born in a high-risk country, and you have not been immunized against hepatitis B (hepatitis B vaccine). ? You have HIV or AIDS. ? You use needles to inject street drugs. ? You live with someone who has hepatitis B. ? You have had sex with someone who has hepatitis B. ? You get hemodialysis treatment. ? You take certain medicines for conditions, including cancer, organ transplantation, and autoimmune conditions.  Hepatitis C  Blood testing is recommended for: ? Everyone born from [birth year] through 1965. ? Anyone with known risk factors for hepatitis C.  Sexually transmitted infections (STIs)  You should be screened for sexually transmitted infections (STIs) including gonorrhea and chlamydia if: ? You are sexually active and are younger than 52 years of age. ? You are older than 52 years of age and your health care provider tells you that you are at risk for this type of infection. ? Your sexual activity has changed since you were last screened and you are at an increased risk for chlamydia or gonorrhea. Ask your health care provider if you are at risk.  If you do not have HIV, but are at risk, it may be recommended that you take a prescription medicine daily to prevent HIV infection. This is called pre-exposure prophylaxis (PrEP). You are considered at risk if: ? You are sexually active and do not regularly use condoms or know the HIV status of your partner(s). ? You take drugs by injection. ? You are sexually active with a partner who has HIV.  Talk with your health care provider about whether you are at high risk of being infected with HIV. If you choose to begin PrEP, you should first be tested for HIV. You should then be tested  every 3 months for as long as you are taking PrEP. Pregnancy  If you are premenopausal and you may become pregnant, ask your health care provider about preconception counseling.  If you may become pregnant, take 400 to 800 micrograms (mcg) of folic acid every day.  If you want to prevent pregnancy, talk to your health care provider about birth  control (contraception). Osteoporosis and menopause  Osteoporosis is a disease in which the bones lose minerals and strength with aging. This can result in serious bone fractures. Your risk for osteoporosis can be identified using a bone density scan.  If you are 37 years of age or older, or if you are at risk for osteoporosis and fractures, ask your health care provider if you should be screened.  Ask your health care provider whether you should take a calcium or vitamin D supplement to lower your risk for osteoporosis.  Menopause may have certain physical symptoms and risks.  Hormone replacement therapy may reduce some of these symptoms and risks. Talk to your health care provider about whether hormone replacement therapy is right for you. Follow these instructions at home:  Schedule regular health, dental, and eye exams.  Stay current with your immunizations.  Do not use any tobacco products including cigarettes, chewing tobacco, or electronic cigarettes.  If you are pregnant, do not drink alcohol.  If you are breastfeeding, limit how much and how often you drink alcohol.  Limit alcohol intake to no more than 1 drink per day for nonpregnant women. One drink equals 12 ounces of beer, 5 ounces of wine, or 1 ounces of hard liquor.  Do not use street drugs.  Do not share needles.  Ask your health care provider for help if you need support or information about quitting drugs.  Tell your health care provider if you often feel depressed.  Tell your health care provider if you have ever been abused or do not feel safe at home. This  information is not intended to replace advice given to you by your health care provider. Make sure you discuss any questions you have with your health care provider. Document Released: 04/12/2011 Document Revised: 03/04/2016 Document Reviewed: 07/01/2015 Elsevier Interactive Patient Education  [employer].

## 2018-11-24 ENCOUNTER — Encounter: Payer: 59 | Admitting: Internal Medicine

## 2018-12-01 ENCOUNTER — Encounter: Payer: 59 | Admitting: Internal Medicine

## 2018-12-05 ENCOUNTER — Other Ambulatory Visit (INDEPENDENT_AMBULATORY_CARE_PROVIDER_SITE_OTHER): Payer: 59

## 2018-12-05 ENCOUNTER — Encounter: Payer: Self-pay | Admitting: Internal Medicine

## 2018-12-05 ENCOUNTER — Ambulatory Visit (INDEPENDENT_AMBULATORY_CARE_PROVIDER_SITE_OTHER): Payer: 59 | Admitting: Internal Medicine

## 2018-12-05 VITALS — BP 120/84 | HR 77 | Temp 98.1°F | Ht 61.0 in | Wt 159.0 lb

## 2018-12-05 DIAGNOSIS — Z0000 Encounter for general adult medical examination without abnormal findings: Secondary | ICD-10-CM

## 2018-12-05 DIAGNOSIS — Z1211 Encounter for screening for malignant neoplasm of colon: Secondary | ICD-10-CM

## 2018-12-05 LAB — LIPID PANEL
CHOLESTEROL: 273 mg/dL — AB (ref 0–200)
HDL: 72.4 mg/dL (ref >39.00)
LDL CALC: 187 mg/dL — AB (ref 0–99)
NONHDL: 200.31
TRIGLYCERIDES: 65 mg/dL (ref 0.0–149.0)
Total CHOL/HDL Ratio: 4
VLDL: 13 mg/dL (ref 0.0–40.0)

## 2018-12-05 LAB — CBC
HCT: 39.9 % (ref 36.0–46.0)
HEMOGLOBIN: 13 g/dL (ref 12.0–15.0)
MCHC: 32.5 g/dL (ref 30.0–36.0)
MCV: 88.1 fl (ref 78.0–100.0)
PLATELETS: 263 10*3/uL (ref 150.0–400.0)
RBC: 4.53 Mil/uL (ref 3.87–5.11)
RDW: 12.7 % (ref 11.5–15.5)
WBC: 4 10*3/uL (ref 4.0–10.5)

## 2018-12-05 LAB — COMPREHENSIVE METABOLIC PANEL
ALBUMIN: 4.4 g/dL (ref 3.5–5.2)
ALT: 13 U/L (ref 0–35)
AST: 14 U/L (ref 0–37)
Alkaline Phosphatase: 70 U/L (ref 39–117)
BILIRUBIN TOTAL: 0.4 mg/dL (ref 0.2–1.2)
BUN: 14 mg/dL (ref 6–23)
CALCIUM: 9.7 mg/dL (ref 8.4–10.5)
CHLORIDE: 102 meq/L (ref 96–112)
CO2: 31 mEq/L (ref 19–32)
Creatinine, Ser: 0.9 mg/dL (ref 0.40–1.20)
GFR: 79.42 mL/min (ref >60.00)
Glucose, Bld: 94 mg/dL (ref 70–99)
Potassium: 3.9 mEq/L (ref 3.5–5.1)
Sodium: 141 mEq/L (ref 135–145)
Total Protein: 7.6 g/dL (ref 6.0–8.3)

## 2018-12-05 NOTE — Progress Notes (Signed)
   Subjective:   Patient ID: Abigail [name], female    DOB: 01/27/1966, 53 y.o.   MRN: [id number]  HPI The patient is a 53 YO female coming in for physical.   PMH, [facility], social history reviewed and updated.   Review of Systems  Constitutional: Negative.   HENT: Negative.   Eyes: Negative.   Respiratory: Negative for cough, chest tightness and shortness of breath.   Cardiovascular: Negative for chest pain, palpitations and leg swelling.  Gastrointestinal: Negative for abdominal distention, abdominal pain, constipation, diarrhea, nausea and vomiting.  Musculoskeletal: Negative.   Skin: Negative.   Neurological: Negative.   Psychiatric/Behavioral: Negative.     Objective:  Physical Exam Constitutional:      Appearance: She is well-developed.  HENT:     Head: Normocephalic and atraumatic.  Neck:     Musculoskeletal: Normal range of motion.  Cardiovascular:     Rate and Rhythm: Normal rate and regular rhythm.  Pulmonary:     Effort: Pulmonary effort is normal. No respiratory distress.     Breath sounds: Normal breath sounds. No wheezing or rales.  Abdominal:     General: Bowel sounds are normal. There is no distension.     Palpations: Abdomen is soft.     Tenderness: There is no abdominal tenderness. There is no rebound.  Skin:    General: Skin is warm and dry.  Neurological:     Mental Status: She is alert and oriented to person, place, and time.     Coordination: Coordination normal.     Vitals:   12/05/18 0818  BP: 120/84  Pulse: 77  Temp: 98.1 F (36.7 C)  TempSrc: Oral  Weight: 159 lb (72.1 kg)  Height: 5\' 1"  (1.549 m)    Assessment & Plan:

## 2018-12-05 NOTE — Assessment & Plan Note (Signed)
Flu shot declined. Shingrix declined. Tetanus up to date. Cologuard ordered. Mammogram declines, pap smear up to date with gyn. Counseled about sun safety and mole surveillance. Counseled about the dangers of distracted driving. Given 10 year screening recommendations.

## 2018-12-05 NOTE — Patient Instructions (Addendum)
Think about getting the shingles shot.   We will get the colon cancer screening test sent to your house.   Health Maintenance, Female Adopting a healthy lifestyle and getting preventive care can go a long way to promote health and wellness. Talk with your health care provider about what schedule of regular examinations is right for you. This is a good chance for you to check in with your provider about disease prevention and staying healthy. In between checkups, there are plenty of things you can do on your own. Experts have done a lot of research about which lifestyle changes and preventive measures are most likely to keep you healthy. Ask your health care provider for more information. Weight and diet Eat a healthy diet  Be sure to include plenty of vegetables, fruits, low-fat dairy products, and lean protein.  Do not eat a lot of foods high in solid fats, added sugars, or salt.  Get regular exercise. This is one of the most important things you can do for your health. ? Most adults should exercise for at least 150 minutes each week. The exercise should increase your heart rate and make you sweat (moderate-intensity exercise). ? Most adults should also do strengthening exercises at least twice a week. This is in addition to the moderate-intensity exercise. Maintain a healthy weight  Body mass index (BMI) is a measurement that can be used to identify possible weight problems. It estimates body fat based on height and weight. Your health care provider can help determine your BMI and help you achieve or maintain a healthy weight.  For females 63 years of age and older: ? A BMI below 18.5 is considered underweight. ? A BMI of 18.5 to 24.9 is normal. ? A BMI of 25 to 29.9 is considered overweight. ? A BMI of 30 and above is considered obese. Watch levels of cholesterol and blood lipids  You should start having your blood tested for lipids and cholesterol at 53 years of age, then have this  test every 5 years.  You may need to have your cholesterol levels checked more often if: ? Your lipid or cholesterol levels are high. ? You are older than 53 years of age. ? You are at high risk for heart disease. Cancer screening Lung Cancer  Lung cancer screening is recommended for adults 89-2 years old who are at high risk for lung cancer because of a history of smoking.  A yearly low-dose CT scan of the lungs is recommended for people who: ? Currently smoke. ? Have quit within the past 15 years. ? Have at least a 30-pack-year history of smoking. A pack year is smoking an average of one pack of cigarettes a day for 1 year.  Yearly screening should continue until it has been 15 years since you quit.  Yearly screening should stop if you develop a health problem that would prevent you from having lung cancer treatment. Breast Cancer  Practice breast self-awareness. This means understanding how your breasts normally appear and feel.  It also means doing regular breast self-exams. Let your health care provider know about any changes, no matter how small.  If you are in your 20s or 30s, you should have a clinical breast exam (CBE) by a health care provider every 1-3 years as part of a regular health exam.  If you are 59 or older, have a CBE every year. Also consider having a breast X-ray (mammogram) every year.  If you have a family history of  breast cancer, talk to your health care provider about genetic screening.  If you are at high risk for breast cancer, talk to your health care provider about having an MRI and a mammogram every year.  Breast cancer gene (BRCA) assessment is recommended for women who have family members with BRCA-related cancers. BRCA-related cancers include: ? Breast. ? Ovarian. ? Tubal. ? Peritoneal cancers.  Results of the assessment will determine the need for genetic counseling and BRCA1 and BRCA2 testing. Cervical Cancer Your health care provider may  recommend that you be screened regularly for cancer of the pelvic organs (ovaries, uterus, and vagina). This screening involves a pelvic examination, including checking for microscopic changes to the surface of your cervix (Pap test). You may be encouraged to have this screening done every 3 years, beginning at age 60.  For women ages 47-65, health care providers may recommend pelvic exams and Pap testing every 3 years, or they may recommend the Pap and pelvic exam, combined with testing for human papilloma virus (HPV), every 5 years. Some types of HPV increase your risk of cervical cancer. Testing for HPV may also be done on women of any age with unclear Pap test results.  Other health care providers may not recommend any screening for nonpregnant women who are considered low risk for pelvic cancer and who do not have symptoms. Ask your health care provider if a screening pelvic exam is right for you.  If you have had past treatment for cervical cancer or a condition that could lead to cancer, you need Pap tests and screening for cancer for at least 20 years after your treatment. If Pap tests have been discontinued, your risk factors (such as having a new sexual partner) need to be reassessed to determine if screening should resume. Some women have medical problems that increase the chance of getting cervical cancer. In these cases, your health care provider may recommend more frequent screening and Pap tests. Colorectal Cancer  This type of cancer can be detected and often prevented.  Routine colorectal cancer screening usually begins at 53 years of age and continues through 53 years of age.  Your health care provider may recommend screening at an earlier age if you have risk factors for colon cancer.  Your health care provider may also recommend using home test kits to check for hidden blood in the stool.  A small camera at the end of a tube can be used to examine your colon directly  (sigmoidoscopy or colonoscopy). This is done to check for the earliest forms of colorectal cancer.  Routine screening usually begins at age 45.  Direct examination of the colon should be repeated every 5-10 years through 53 years of age. However, you may need to be screened more often if early forms of precancerous polyps or small growths are found. Skin Cancer  Check your skin from head to toe regularly.  Tell your health care provider about any new moles or changes in moles, especially if there is a change in a mole's shape or color.  Also tell your health care provider if you have a mole that is larger than the size of a pencil eraser.  Always use sunscreen. Apply sunscreen liberally and repeatedly throughout the day.  Protect yourself by wearing long sleeves, pants, a wide-brimmed hat, and sunglasses whenever you are outside. Heart disease, diabetes, and high blood pressure  High blood pressure causes heart disease and increases the risk of stroke. High blood pressure is more likely  to develop in: ? People who have blood pressure in the high end of the normal range (130-139/85-89 mm Hg). ? People who are overweight or obese. ? People who are African American.  If you are 59-12 years of age, have your blood pressure checked every 3-5 years. If you are 24 years of age or older, have your blood pressure checked every year. You should have your blood pressure measured twice-once when you are at a hospital or clinic, and once when you are not at a hospital or clinic. Record the average of the two measurements. To check your blood pressure when you are not at a hospital or clinic, you can use: ? An automated blood pressure machine at a pharmacy. ? A home blood pressure monitor.  If you are between 31 years and 38 years old, ask your health care provider if you should take aspirin to prevent strokes.  Have regular diabetes screenings. This involves taking a blood sample to check your  fasting blood sugar level. ? If you are at a normal weight and have a low risk for diabetes, have this test once every three years after 53 years of age. ? If you are overweight and have a high risk for diabetes, consider being tested at a younger age or more often. Preventing infection Hepatitis B  If you have a higher risk for hepatitis B, you should be screened for this virus. You are considered at high risk for hepatitis B if: ? You were born in a country where hepatitis B is common. Ask your health care provider which countries are considered high risk. ? Your parents were born in a high-risk country, and you have not been immunized against hepatitis B (hepatitis B vaccine). ? You have HIV or AIDS. ? You use needles to inject street drugs. ? You live with someone who has hepatitis B. ? You have had sex with someone who has hepatitis B. ? You get hemodialysis treatment. ? You take certain medicines for conditions, including cancer, organ transplantation, and autoimmune conditions. Hepatitis C  Blood testing is recommended for: ? Everyone born from [birth year] through 1965. ? Anyone with known risk factors for hepatitis C. Sexually transmitted infections (STIs)  You should be screened for sexually transmitted infections (STIs) including gonorrhea and chlamydia if: ? You are sexually active and are younger than 53 years of age. ? You are older than 53 years of age and your health care provider tells you that you are at risk for this type of infection. ? Your sexual activity has changed since you were last screened and you are at an increased risk for chlamydia or gonorrhea. Ask your health care provider if you are at risk.  If you do not have HIV, but are at risk, it may be recommended that you take a prescription medicine daily to prevent HIV infection. This is called pre-exposure prophylaxis (PrEP). You are considered at risk if: ? You are sexually active and do not regularly use condoms or  know the HIV status of your partner(s). ? You take drugs by injection. ? You are sexually active with a partner who has HIV. Talk with your health care provider about whether you are at high risk of being infected with HIV. If you choose to begin PrEP, you should first be tested for HIV. You should then be tested every 3 months for as long as you are taking PrEP. Pregnancy  If you are premenopausal and you may become pregnant, ask your  health care provider about preconception counseling.  If you may become pregnant, take 400 to 800 micrograms (mcg) of folic acid every day.  If you want to prevent pregnancy, talk to your health care provider about birth control (contraception). Osteoporosis and menopause  Osteoporosis is a disease in which the bones lose minerals and strength with aging. This can result in serious bone fractures. Your risk for osteoporosis can be identified using a bone density scan.  If you are 22 years of age or older, or if you are at risk for osteoporosis and fractures, ask your health care provider if you should be screened.  Ask your health care provider whether you should take a calcium or vitamin D supplement to lower your risk for osteoporosis.  Menopause may have certain physical symptoms and risks.  Hormone replacement therapy may reduce some of these symptoms and risks. Talk to your health care provider about whether hormone replacement therapy is right for you. Follow these instructions at home:  Schedule regular health, dental, and eye exams.  Stay current with your immunizations.  Do not use any tobacco products including cigarettes, chewing tobacco, or electronic cigarettes.  If you are pregnant, do not drink alcohol.  If you are breastfeeding, limit how much and how often you drink alcohol.  Limit alcohol intake to no more than 1 drink per day for nonpregnant women. One drink equals 12 ounces of beer, 5 ounces of wine, or 1 ounces of hard  liquor.  Do not use street drugs.  Do not share needles.  Ask your health care provider for help if you need support or information about quitting drugs.  Tell your health care provider if you often feel depressed.  Tell your health care provider if you have ever been abused or do not feel safe at home. This information is not intended to replace advice given to you by your health care provider. Make sure you discuss any questions you have with your health care provider. Document Released: 04/12/2011 Document Revised: 03/04/2016 Document Reviewed: 07/01/2015 Elsevier Interactive Patient Education  2019 [employer].

## 2018-12-06 ENCOUNTER — Telehealth: Payer: Self-pay | Admitting: Internal Medicine

## 2018-12-06 NOTE — Telephone Encounter (Signed)
Filled out and placed in MD folder to sign

## 2018-12-06 NOTE — Telephone Encounter (Signed)
Pt dropped off CPE forms to be filled out, they are due by 2.28.20. She would will come and pick them up.  Please advise placed in PCP'S box

## 2018-12-12 ENCOUNTER — Encounter: Payer: Self-pay | Admitting: Internal Medicine

## 2018-12-12 NOTE — Progress Notes (Signed)
Abstracted and sent to scan  

## 2019-09-10 ENCOUNTER — Encounter: Payer: Self-pay | Admitting: Internal Medicine

## 2019-09-10 ENCOUNTER — Ambulatory Visit (INDEPENDENT_AMBULATORY_CARE_PROVIDER_SITE_OTHER): Payer: 59 | Admitting: Internal Medicine

## 2019-09-10 ENCOUNTER — Other Ambulatory Visit: Payer: Self-pay

## 2019-09-10 VITALS — BP 110/84 | HR 88 | Temp 99.3°F | Ht 61.0 in | Wt 165.0 lb

## 2019-09-10 DIAGNOSIS — M79602 Pain in left arm: Secondary | ICD-10-CM | POA: Insufficient documentation

## 2019-09-10 MED ORDER — PREDNISONE 20 MG PO TABS: 40.0000 mg | ORAL_TABLET | Freq: Every day | ORAL | 0 refills | Status: DC
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
## 2019-09-10 NOTE — Patient Instructions (Signed)
We have sent in prednisone to take 2 pills daily for 5 days. This should help to start the healing.    Shoulder Range of Motion Exercises Shoulder range of motion (ROM) exercises are done to keep the shoulder moving freely or to increase movement. They are often recommended for people who have shoulder pain or stiffness or who are recovering from a shoulder surgery. Phase 1 exercises When you are able, do this exercise 1-2 times per day for 30-60 seconds in each direction, or as directed by your health care provider. Pendulum exercise To do this exercise while sitting: 1. Sit in a chair or at the edge of your bed with your feet flat on the floor. 2. Let your affected arm hang down in front of you over the edge of the bed or chair. 3. Relax your shoulder, arm, and hand. [place] your body so your arm gently swings in small circles. You can also use your unaffected arm to start the motion. 5. Repeat changing the direction of the circles, swinging your arm left and right, and swinging your arm forward and back. To do this exercise while standing: 1. Stand next to a sturdy chair or table, and hold on to it with your hand on your unaffected side. 2. Bend forward at the waist. 3. Bend your knees slightly. 4. Relax your shoulder, arm, and hand. 5. While keeping your shoulder relaxed, use body motion to swing your arm in small circles. 6. Repeat changing the direction of the circles, swinging your arm left and right, and swinging your arm forward and back. 7. Between exercises, stand up tall and take a short break to relax your lower back.  Phase 2 exercises Do these exercises 1-2 times per day or as told by your health care provider. Hold each stretch for 30 seconds, and repeat 3 times. Do the exercises with one or both arms as instructed by your health care provider. For these exercises, sit at a table with your hand and arm supported by the table. A chair that slides easily or has wheels can be  helpful. External rotation 1. Turn your chair so that your affected side is nearest to the table. 2. Place your forearm on the table to your side. Bend your elbow about 90 at the elbow (right angle) and place your hand palm facing down on the table. Your elbow should be about 6 inches away from your side. 3. Keeping your arm on the table, lean your body forward. Abduction 1. Turn your chair so that your affected side is nearest to the table. 2. Place your forearm and hand on the table so that your thumb points toward the ceiling and your arm is straight out to your side. 3. Slide your hand out to the side and away from you, using your unaffected arm to do the work. 4. To increase the stretch, you can slide your chair away from the table. Flexion: forward stretch 1. Sit facing the table. Place your hand and elbow on the table in front of you. 2. Slide your hand forward and away from you, using your unaffected arm to do the work. 3. To increase the stretch, you can slide your chair backward. Phase 3 exercises Do these exercises 1-2 times per day or as told by your health care provider. Hold each stretch for 30 seconds, and repeat 3 times. Do the exercises with one or both arms as instructed by your health care provider. Cross-body stretch: posterior capsule stretch  1. Lift your arm straight out in front of you. 2. Bend your arm 90 at the elbow (right angle) so your forearm moves across your body. 3. Use your other arm to gently pull the elbow across your body, toward your other shoulder. Wall climbs 1. Stand with your affected arm extended out to the side with your hand resting on a door frame. 2. Slide your hand slowly up the door frame. 3. To increase the stretch, step through the door frame. Keep your body upright and do not lean. Wand exercises You will need a cane, a piece of PVC pipe, or a sturdy wooden dowel for wand exercises. Flexion To do this exercise while standing: 1. Hold  the wand with both of your hands, palms down. 2. Using the other arm to help, lift your arms up and over your head, if able. 3. Push upward with your other arm to gently increase the stretch. To do this exercise while lying down: 1. Lie on your back with your elbows resting on the floor and the wand in both your hands. Your hands will be palm down, or pointing toward your feet. 2. Lift your hands toward the ceiling, using your unaffected arm to help if needed. 3. Bring your arms overhead as able, using your unaffected arm to help if needed. Internal rotation 1. Stand while holding the wand behind you with both hands. Your unaffected arm should be extended above your head with the arm of the affected side extended behind you at the level of your waist. The wand should be pointing straight up and down as you hold it. 2. Slowly pull the wand up behind your back by straightening the elbow of your unaffected arm and bending the elbow of your affected arm. External rotation 1. Lie on your back with your affected upper arm supported on a small pillow or rolled towel. When you first do this exercise, keep your upper arm close to your body. Over time, bring your arm up to a 90 angle out to the side. 2. Hold the wand across your stomach and with both hands palm up. Your elbow on your affected side should be bent at a 90 angle. 3. Use your unaffected side to help push your forearm away from you and toward the floor. Keep your elbow on your affected side bent at a 90 angle. Contact a health care provider if you have:  New or increasing pain.  New numbness, tingling, weakness, or discoloration in your arm or hand. This information is not intended to replace advice given to you by your health care provider. Make sure you discuss any questions you have with your health care provider. Document Released: 06/26/2003 Document Revised: 11/09/2017 Document Reviewed: 11/09/2017 Elsevier Patient Education  2020  [employer].

## 2019-09-10 NOTE — Assessment & Plan Note (Signed)
Rx prednisone burst, could be sprain or strain. Given stretching exercises.

## 2019-09-17 ENCOUNTER — Encounter: Payer: Self-pay | Admitting: Family Medicine

## 2019-09-17 ENCOUNTER — Encounter: Payer: Self-pay | Admitting: Internal Medicine

## 2019-09-17 ENCOUNTER — Ambulatory Visit (INDEPENDENT_AMBULATORY_CARE_PROVIDER_SITE_OTHER): Payer: 59 | Admitting: Internal Medicine

## 2019-09-17 ENCOUNTER — Ambulatory Visit (INDEPENDENT_AMBULATORY_CARE_PROVIDER_SITE_OTHER): Payer: 59 | Admitting: Family Medicine

## 2019-09-17 ENCOUNTER — Other Ambulatory Visit: Payer: Self-pay

## 2019-09-17 VITALS — BP 130/80 | HR 81 | Ht 61.0 in | Wt 169.0 lb

## 2019-09-17 VITALS — BP 130/80 | HR 81 | Temp 98.7°F | Ht 61.0 in | Wt 169.0 lb

## 2019-09-17 DIAGNOSIS — M7582 Other shoulder lesions, left shoulder: Secondary | ICD-10-CM

## 2019-09-17 DIAGNOSIS — M79602 Pain in left arm: Secondary | ICD-10-CM

## 2019-09-17 DIAGNOSIS — M5412 Radiculopathy, cervical region: Secondary | ICD-10-CM | POA: Diagnosis not present

## 2019-09-17 DIAGNOSIS — G2589 Other specified extrapyramidal and movement disorders: Secondary | ICD-10-CM

## 2019-09-17 MED ORDER — GABAPENTIN 300 MG PO CAPS: ORAL_CAPSULE | ORAL | 3 refills | Status: DC

## 2019-09-17 NOTE — Patient Instructions (Signed)
Thank you for coming in today. Attend PT.  Take gabapentin at bedtime.  Recheck with me in 1 month or sooner if needed.  Use voltaren gel OTC as well up to 4x daily.   Let me know if not doing well.    Gabapentin capsules or tablets What is this medicine? GABAPENTIN (GA ba pen tin) is used to control seizures in certain types of epilepsy. It is also used to treat certain types of nerve pain. This medicine may be used for other purposes; ask your health care provider or pharmacist if you have questions. COMMON BRAND NAME(S): Active-PAC with Gabapentin, Gabarone, Neurontin What should I tell my health care provider before I take this medicine? They need to know if you have any of these conditions:  history of drug abuse or alcohol abuse problem  kidney disease  lung or breathing disease  suicidal thoughts, plans, or attempt; a previous suicide attempt by you or a family member  an unusual or allergic reaction to gabapentin, other medicines, foods, dyes, or preservatives  pregnant or trying to get pregnant  breast-feeding How should I use this medicine? Take this medicine by mouth with a glass of water. Follow the directions on the prescription label. You can take it with or without food. If it upsets your stomach, take it with food. Take your medicine at regular intervals. Do not take it more often than directed. Do not stop taking except on your doctor's advice. If you are directed to break the 600 or 800 mg tablets in half as part of your dose, the extra half tablet should be used for the next dose. If you have not used the extra half tablet within 28 days, it should be thrown away. A special MedGuide will be given to you by the pharmacist with each prescription and refill. Be sure to read this information carefully each time. Talk to your pediatrician regarding the use of this medicine in children. While this drug may be prescribed for children as young as 3 years for selected  conditions, precautions do apply. Overdosage: If you think you have taken too much of this medicine contact a poison control center or emergency room at once. NOTE: This medicine is only for you. Do not share this medicine with others. What if I miss a dose? If you miss a dose, take it as soon as you can. If it is almost time for your next dose, take only that dose. Do not take double or extra doses. What may interact with this medicine? This medicine may interact with the following medications:  alcohol  antihistamines for allergy, cough, and cold  certain medicines for anxiety or sleep  certain medicines for depression like amitriptyline, fluoxetine, sertraline  certain medicines for seizures like phenobarbital, primidone  certain medicines for stomach problems  general anesthetics like halothane, isoflurane, methoxyflurane, propofol  local anesthetics like lidocaine, pramoxine, tetracaine  medicines that relax muscles for surgery  narcotic medicines for pain  phenothiazines like chlorpromazine, mesoridazine, prochlorperazine, thioridazine This list may not describe all possible interactions. Give your health care provider a list of all the medicines, herbs, non-prescription drugs, or dietary supplements you use. Also tell them if you smoke, drink alcohol, or use illegal drugs. Some items may interact with your medicine. What should I watch for while using this medicine? Visit your doctor or health care provider for regular checks on your progress. You may want to keep a record at home of how you feel your condition is  responding to treatment. You may want to share this information with your doctor or health care provider at each visit. You should contact your doctor or health care provider if your seizures get worse or if you have any new types of seizures. Do not stop taking this medicine or any of your seizure medicines unless instructed by your doctor or health care provider.  Stopping your medicine suddenly can increase your seizures or their severity. This medicine may cause serious skin reactions. They can happen weeks to months after starting the medicine. Contact your health care provider right away if you notice fevers or flu-like symptoms with a rash. The rash may be red or purple and then turn into blisters or peeling of the skin. Or, you might notice a red rash with swelling of the face, lips or lymph nodes in your neck or under your arms. Wear a medical identification bracelet or chain if you are taking this medicine for seizures, and carry a card that lists all your medications. You may get drowsy, dizzy, or have blurred vision. Do not drive, use machinery, or do anything that needs mental alertness until you know how this medicine affects you. To reduce dizzy or fainting spells, do not sit or stand up quickly, especially if you are an older patient. Alcohol can increase drowsiness and dizziness. Avoid alcoholic drinks. Your mouth may get dry. Chewing sugarless gum or sucking hard candy, and drinking plenty of water will help. The use of this medicine may increase the chance of suicidal thoughts or actions. Pay special attention to how you are responding while on this medicine. Any worsening of mood, or thoughts of suicide or dying should be reported to your health care provider right away. Women who become pregnant while using this medicine may enroll in the [place] Pregnancy Registry by calling ([phone]. This registry collects information about the safety of antiepileptic drug use during pregnancy. What side effects may I notice from receiving this medicine? Side effects that you should report to your doctor or health care professional as soon as possible:  allergic reactions like skin rash, itching or hives, swelling of the face, lips, or tongue  breathing problems  rash, fever, and swollen lymph nodes  redness, blistering,  peeling or loosening of the skin, including inside the mouth  suicidal thoughts, mood changes Side effects that usually do not require medical attention (report to your doctor or health care professional if they continue or are bothersome):  dizziness  drowsiness  headache  nausea, vomiting  swelling of ankles, feet, hands  tiredness This list may not describe all possible side effects. Call your doctor for medical advice about side effects. You may report side effects to FDA at 1-800-FDA-1088. Where should I keep my medicine? Keep out of reach of children. This medicine may cause accidental overdose and death if it taken by other adults, children, or pets. Mix any unused medicine with a substance like cat litter or coffee grounds. Then throw the medicine away in a sealed container like a sealed bag or a coffee can with a lid. Do not use the medicine after the expiration date. Store at room temperature between 15 and 30 degrees C (59 and 86 degrees F). NOTE: This sheet is a summary. It may not cover all possible information. If you have questions about this medicine, talk to your doctor, pharmacist, or health care provider.  2020 Elsevier/Gold Standard (2018-12-29 14:16:43)

## 2019-09-17 NOTE — Progress Notes (Signed)
Subjective:    I'm seeing this patient as a consultation for:  Dr. Sharlet Salina.  Note will be routed back to referring provider.  CC: L shoulder pain  I, Abigail [name], LAT, ATC, am serving as scribe for Dr. Lynne Leader.  HPI: Pt is a 53 y/o female presenting w/ c/o L shoulder pain since Sept 2020.  She has seen her PCP 2x and prescribed a steroid dose pack w/ little relief.  Pt rates her pain as a 4-6/10 and describes it as a tightness.  Pt experiences some intermittent numbness in her L forearm.  She denies mechanical symptoms in her L shoulder.  She notes swelling/fullness along her L lateral neck / scalene area.  She has tried heat, Advil and BenGay which does ease her pain so that she can sleep.  Aggravating factors include L shoulder motion above 90 deg.  Past medical history, Surgical history, Family history not pertinant except as noted below, Social history, Allergies, and medications have been entered into the medical record, reviewed, and no changes needed.   Review of Systems: No headache, visual changes, nausea, vomiting, diarrhea, constipation, dizziness, abdominal pain, skin rash, fevers, chills, night sweats, weight loss, swollen lymph nodes, body aches, joint swelling, muscle aches, chest pain, shortness of breath, mood changes, visual or auditory hallucinations.   Objective:    Vitals:   09/17/19 1439  BP: 130/80  Pulse: 81   General: Well Developed, well nourished, and in no acute distress.  Neuro/Psych: Alert and oriented x3, extra-ocular muscles intact, able to move all 4 extremities, sensation grossly intact. Skin: Warm and dry, no rashes noted.  Respiratory: Not using accessory muscles, speaking in full sentences, trachea midline.  Cardiovascular: Pulses palpable, no extremity edema. Abdomen: Does not appear distended. MSK:  C-spine: Nontender to spinal midline.  Normal cervical motion.  Mildly tender palpation left trapezius. Left shoulder normal-appearing Range of  motion abduction limited to 100 degrees active and passive by pain. Internal rotation limited to lumbar spine. external rotation limited to 35 degrees beyond neutral position. Tender palpation left shoulder especially at terries major and latissimus area.  Strength: Abduction 4/5.  External rotation 5/5, internal rotation 5/5.  Negative Hawkins and Neer's test negative Yergason's and speeds test.  Positive empty can test.  Pulses cap refill and sensation intact bilateral upper extremities. Left upper extremity strength intact throughout bilateral upper extremities except for noted above.    Impression and Recommendations:    Assessment and Plan: 53 y.o. female with  Left shoulder pain and dysfunction with some paresthesias into forearm and hand. Patient has symptoms consistent with rotator cuff tendinopathy as well as significant scapular dysfunction and dyskinesis.  She has some paresthesias into her arm which could possibly be due to cervical radiculopathy versus more peripheral nerve compression.  Discussed options.  Plan for trial of physical therapy as well as gabapentin.  Check back in a month.  If no better next step would be imaging and injection..   Orders Placed This Encounter  Procedures  . Ambulatory referral to Physical Therapy    Referral Priority:   Routine    Referral Type:   Physical Medicine    Referral Reason:   Specialty Services Required    Requested Specialty:   Physical Therapy   Meds ordered this encounter  Medications  . gabapentin (NEURONTIN) 300 MG capsule    Sig: One tab PO qHS for a week, then BID for a week, then TID. May double weekly to a max  of 3,600mg /day    Dispense:  180 capsule    Refill:  3    Discussed warning signs or symptoms. Please see discharge instructions. Patient expresses understanding.  The above documentation has been reviewed and is accurate and complete Lynne Leader

## 2019-09-17 NOTE — Progress Notes (Signed)
   Subjective:   Patient ID: Abigail [name], female    DOB: 12-13-1965, 53 y.o.   MRN: [id number]  HPI The patient is a 53 YO female coming in for concerns about left arm pain. Started back in September. Came in and did prednisone pack about 2 weeks ago. Had no pain relief with this. Overall it is not improving. 4-6/10 pain. Feels like there is a little tightness or swelling in the upper left arm. Denies significant change in symptoms since last visit. Did not try stretching exercises.   Review of Systems  Constitutional: Negative.   HENT: Negative.   Eyes: Negative.   Respiratory: Negative for cough, chest tightness and shortness of breath.   Cardiovascular: Negative for chest pain, palpitations and leg swelling.  Gastrointestinal: Negative for abdominal distention, abdominal pain, constipation, diarrhea, nausea and vomiting.  Musculoskeletal: Positive for myalgias.  Skin: Negative.   Neurological: Negative.   Psychiatric/Behavioral: Negative.     Objective:  Physical Exam Constitutional:      Appearance: She is well-developed.  HENT:     Head: Normocephalic and atraumatic.     Comments: Oropharynx with redness and clear drainage, nose with swollen turbinates, TMs normal bilaterally.  Neck:     Musculoskeletal: Normal range of motion.     Thyroid: No thyromegaly.  Cardiovascular:     Rate and Rhythm: Normal rate and regular rhythm.  Pulmonary:     Effort: Pulmonary effort is normal. No respiratory distress.     Breath sounds: Normal breath sounds. No wheezing or rales.  Abdominal:     Palpations: Abdomen is soft.  Musculoskeletal:        General: Tenderness present.     Comments: Pain left triceps region and left axillary region, no LAD or masses appreciated  Lymphadenopathy:     Cervical: No cervical adenopathy.  Skin:    General: Skin is warm and dry.  Neurological:     Mental Status: She is alert and oriented to person, place, and time.     Vitals:   09/17/19 1357   BP: 130/80  Pulse: 81  Temp: 98.7 F (37.1 C)  TempSrc: Oral  Weight: 169 lb (76.7 kg)  Height: 5\' 1"  (1.549 m)    This visit occurred during the SARS-CoV-2 public health emergency.  Safety protocols were in place, including screening questions prior to the visit, additional usage of staff PPE, and extensive cleaning of exam room while observing appropriate contact time as indicated for disinfecting solutions.   Assessment & Plan:

## 2019-09-17 NOTE — Patient Instructions (Signed)
We will get you in with sports medicine to help the shoulder/arm.

## 2019-09-17 NOTE — Assessment & Plan Note (Addendum)
Refer to sports medicine but suspect she could have a tear in tendon or muscle.

## 2019-09-20 ENCOUNTER — Other Ambulatory Visit: Payer: Self-pay

## 2019-09-20 DIAGNOSIS — Z20822 Contact with and (suspected) exposure to covid-19: Secondary | ICD-10-CM

## 2019-09-22 LAB — NOVEL CORONAVIRUS, NAA: SARS-CoV-2, NAA: NOT DETECTED

## 2019-10-14 ENCOUNTER — Other Ambulatory Visit: Payer: Self-pay

## 2019-10-14 ENCOUNTER — Emergency Department (HOSPITAL_BASED_OUTPATIENT_CLINIC_OR_DEPARTMENT_OTHER)
Admission: EM | Admit: 2019-10-14 | Discharge: 2019-10-14 | Disposition: A | Discharge status: Home / Self Care | Payer: Managed Care, Other (non HMO) | Attending: Emergency Medicine | Admitting: Emergency Medicine

## 2019-10-14 ENCOUNTER — Encounter (HOSPITAL_BASED_OUTPATIENT_CLINIC_OR_DEPARTMENT_OTHER): Payer: Self-pay | Admitting: Emergency Medicine

## 2019-10-14 DIAGNOSIS — M25512 Pain in left shoulder: Secondary | ICD-10-CM | POA: Diagnosis not present

## 2019-10-14 DIAGNOSIS — Z79899 Other long term (current) drug therapy: Secondary | ICD-10-CM | POA: Insufficient documentation

## 2019-10-14 DIAGNOSIS — M79622 Pain in left upper arm: Secondary | ICD-10-CM | POA: Diagnosis present

## 2019-10-14 MED ORDER — HYDROCODONE-ACETAMINOPHEN 5-325 MG PO TABS: 1.0000 | ORAL_TABLET | Freq: Four times a day (QID) | ORAL | 0 refills | Status: DC | PRN

## 2019-10-14 NOTE — ED Triage Notes (Signed)
L upper arm pain and swelling since October. Has been seen by PCP and had xrays. Pain and swelling continue.

## 2019-10-14 NOTE — ED Provider Notes (Signed)
[facility] Provider Note   CSN: [id number] Arrival date & time: 10/14/19  0940     History Chief Complaint  Patient presents with  . Arm Pain    Abigail [name] is a 54 y.o. female.  HPI Patient presents with left shoulder and upper arm pain.  In October she had an injury where she is lifting up her something and had a strain.  Has been seen by PCP sports medicine and Ortho.  Continued pain and swelling.  Has been on Neurontin but was unable to take it due to being sedate.  Had been on tizanidine.  Also has been on other anti-inflammatories.  No fevers.  Has had numbness and paresthesias down the back of the arm.  Reviewing notes it appears they thought it was irritation of the nerves likely some sort of disruption of the soft tissue.  Plan was injection if needed and physical therapy.  Potentially more imaging.    Past Medical History:  Diagnosis Date  . Allergic rhinitis   . Hyperlipidemia    LDL 150's '11    Patient Active Problem List   Diagnosis Date Noted  . Left arm pain 09/10/2019  . Routine health maintenance 08/04/2011  . Borderline hyperlipidemia 06/05/2010    Past Surgical History:  Procedure Laterality Date  . CESAREAN SECTION       OB History   No obstetric history on file.     Family History  Problem Relation Age of Onset  . Diabetes Mother   . Hypertension Mother   . Hypertension Father   . Hypertension Sister   . Heart disease Sister        MI - no serious damage to heart  . Cancer Neg Hx     Social History   Tobacco Use  . Smoking status: Never Smoker  . Smokeless tobacco: Never Used  Substance Use Topics  . Alcohol use: Yes    Comment: rare use - twice rare  . Drug use: No    Home Medications Prior to Admission medications   Medication Sig Start Date End Date Taking? Authorizing Provider  gabapentin (NEURONTIN) 300 MG capsule One tab PO qHS for a week, then BID for a week, then TID. May double weekly  to a max of 3,600mg /day 09/17/19   Gregor Hams, MD  HYDROcodone-acetaminophen (NORCO/VICODIN) 5-325 MG tablet Take 1-2 tablets by mouth every 6 (six) hours as needed. 10/14/19   Davonna Belling, MD    Allergies    Sulfamethoxazole  Review of Systems   Review of Systems  Constitutional: Negative for appetite change.  Respiratory: Negative for shortness of breath.   Cardiovascular: Negative for chest pain.  Musculoskeletal:       Left shoulder pain and some swelling.  Skin: Negative for wound.  Neurological: Positive for numbness. Negative for weakness.    Physical Exam Updated Vital Signs BP 110/79 (BP Location: Right Arm)   Pulse 98   Temp 98.9 F (37.2 C) (Oral)   Resp 16   Ht 5\' 1"  (1.549 m)   Wt 74.8 kg   SpO2 100%   BMI 31.18 kg/m   Physical Exam Vitals and nursing note reviewed.  Cardiovascular:     Rate and Rhythm: Regular rhythm.  Musculoskeletal:     Comments: Left upper extremity held against body.  Some tenderness posterior down LAT with some swelling.  Neurologic grossly intact in hand with radial pulse intact.  Decreased range of motion of shoulder.  No ecchymosis.  No swelling in the hand or forearm.  Neurological:     Mental Status: She is alert.     ED Results / Procedures / Treatments   Labs (all labs ordered are listed, but only abnormal results are displayed) Labs Reviewed - No data to display  EKG None  Radiology No results found.  Procedures Procedures (including critical care time)  Medications Ordered in ED Medications - No data to display  ED Course  I have reviewed the triage vital signs and the nursing notes.  Pertinent labs & imaging results that were available during my care of the patient were reviewed by me and considered in my medical decision making (see chart for details).    MDM Rules/Calculators/A&P                      Patient with pain and swelling in left shoulder since injury.  Has had previous work-up  including being seen by Ortho and sports medicine.  At this point did not think she needs more imaging here.  I think likely has some soft tissue abnormality causing neuropathy from either compression on the brachial plexus or less likely from the cervical spine.  Think likely will need further imaging such as an MRI which I do not have access to here.  She has been seen by Ortho and sports medicine.  Has had an injection and but on muscle relaxers and anti-inflammatories without much relief.  Although patient does state swelling is a lot less than it was.  Will give short course of stronger pain medicine to help her sleep and will give a sling to help with the pain.  However patient was instructed that she does need good motion in the shoulder.  Will discharge home Final Clinical Impression(s) / ED Diagnoses Final diagnoses:  Left shoulder pain, unspecified chronicity    Rx / DC Orders ED Discharge Orders         Ordered    HYDROcodone-acetaminophen (NORCO/VICODIN) 5-325 MG tablet  Every 6 hours PRN     10/14/19 1125           Davonna Belling, MD 10/14/19 1130

## 2019-10-16 ENCOUNTER — Ambulatory Visit: Payer: Managed Care, Other (non HMO) | Admitting: Internal Medicine

## 2019-10-17 ENCOUNTER — Ambulatory Visit (HOSPITAL_COMMUNITY)
Admission: RE | Admit: 2019-10-17 | Discharge: 2019-10-17 | Disposition: A | Discharge status: Home / Self Care | Payer: Managed Care, Other (non HMO) | Source: Ambulatory Visit | Attending: Cardiology | Admitting: Cardiology

## 2019-10-17 ENCOUNTER — Ambulatory Visit: Payer: Managed Care, Other (non HMO) | Admitting: Internal Medicine

## 2019-10-17 ENCOUNTER — Other Ambulatory Visit (HOSPITAL_COMMUNITY): Payer: Self-pay | Admitting: Orthopedic Surgery

## 2019-10-17 ENCOUNTER — Other Ambulatory Visit: Payer: Self-pay

## 2019-10-17 DIAGNOSIS — M79602 Pain in left arm: Secondary | ICD-10-CM | POA: Diagnosis present

## 2019-10-19 ENCOUNTER — Encounter: Payer: Self-pay | Admitting: Internal Medicine

## 2019-10-19 ENCOUNTER — Other Ambulatory Visit: Payer: Self-pay

## 2019-10-19 ENCOUNTER — Ambulatory Visit (INDEPENDENT_AMBULATORY_CARE_PROVIDER_SITE_OTHER): Payer: Managed Care, Other (non HMO) | Admitting: Internal Medicine

## 2019-10-19 VITALS — BP 124/82 | HR 96 | Temp 98.3°F | Ht 61.0 in | Wt 162.0 lb

## 2019-10-19 DIAGNOSIS — I82A12 Acute embolism and thrombosis of left axillary vein: Secondary | ICD-10-CM | POA: Diagnosis not present

## 2019-10-19 DIAGNOSIS — M79602 Pain in left arm: Secondary | ICD-10-CM | POA: Diagnosis not present

## 2019-10-19 MED ORDER — APIXABAN 5 MG PO TABS: 5.0000 mg | ORAL_TABLET | Freq: Two times a day (BID) | ORAL | 1 refills | Status: DC

## 2019-10-19 NOTE — Assessment & Plan Note (Signed)
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 Treating with eliquis and working up for hypercoagulable state.

## 2019-10-19 NOTE — Patient Instructions (Signed)
We have sent in the refill of the eliquis. Take the starter pack until gone. You will be on eliquis for 3 months total.   Try a sleeve for the arm to help with swelling.   We are doing blood work today to check for any other causes of the blood clot.  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  Deep Vein Thrombosis  Deep vein thrombosis (DVT) is a condition in which a blood clot forms in a deep vein, such as a lower leg, thigh, or arm vein. A clot is blood that has thickened into a gel or solid. This condition is dangerous. It can lead to serious and even life-threatening complications if the clot travels to the lungs and causes a blockage (pulmonary embolism). It can also damage veins in the leg. This can result in leg pain, swelling, discoloration, and sores (post-thrombotic syndrome). What are the causes? This condition may be caused by:  A slowdown of blood flow.  Damage to a vein.  A condition that causes blood to clot more easily, such as an inherited clotting disorder. What increases the risk? The following factors may make you more likely to develop this condition:  Being overweight.  Being older, especially over age 54.  Sitting or lying down for more than four hours.  Being in the hospital.  Lack of physical activity (sedentary lifestyle).  Pregnancy, being in childbirth, or having recently given birth.  Taking medicines that contain estrogen, such as medicines to prevent pregnancy.  Smoking.  A history of any of the following: ? Blood clots or a blood clotting disease. ? Peripheral vascular disease. ? Inflammatory bowel disease. ? Cancer. ? Heart disease. ? Genetic conditions that affect how your blood clots, such as Factor V Leiden mutation. ? Neurological diseases that affect your legs (leg paresis). ? A recent injury, such as a car accident. ? Major or lengthy surgery. ? A central line placed inside a  large vein. What are the signs or symptoms? Symptoms of this condition include:  Swelling, pain, or tenderness in an arm or leg.  Warmth, redness, or discoloration in an arm or leg. If the clot is in your leg, symptoms may be more noticeable or worse when you stand or walk. Some people may not develop any symptoms. How is this diagnosed? This condition is diagnosed with:  A medical history and physical exam.  Tests, such as: ? Blood tests. These are done to check how well your blood clots. ? Ultrasound. This is done to check for clots. ? Venogram. For this test, contrast dye is injected into a vein and X-rays are taken to check for any clots. How is this treated? Treatment for this condition depends on:  The cause of your DVT.  Your risk for bleeding or developing more clots.  Any other medical conditions that you have. Treatment may include:  Taking a blood thinner (anticoagulant). This type of medicine prevents clots from forming. It may be taken by mouth, injected under the skin, or injected through an IV (catheter).  Injecting clot-dissolving medicines into the affected vein (catheter-directed thrombolysis).  Having surgery. Surgery may be done to: ? Remove the clot. ? Place a filter in a large vein to catch blood clots before they reach the lungs. Some treatments may be continued for up to six months. Follow these instructions at home: If you are taking blood thinners:  Take the medicine exactly as told by your  health care provider. Some blood thinners need to be taken at the same time every day. Do not skip a dose.  Talk with your health care provider before you take any medicines that contain aspirin or NSAIDs. These medicines increase your risk for dangerous bleeding.  Ask your health care provider about foods and drugs that could change the way the medicine works (may interact). Avoid those things if your health care provider tells you to do so.  Blood thinners  can cause easy bruising and may make it difficult to stop bleeding. Because of this: ? Be very careful when using knives, scissors, or other sharp objects. ? Use an electric razor instead of a blade. ? Avoid activities that could cause injury or bruising, and follow instructions about how to prevent falls.  Wear a medical alert bracelet or carry a card that lists what medicines you take. General instructions  Take over-the-counter and prescription medicines only as told by your health care provider.  Return to your normal activities as told by your health care provider. Ask your health care provider what activities are safe for you.  Wear compression stockings if recommended by your health care provider.  Keep all follow-up visits as told by your health care provider. This is important. How is this prevented? To lower your risk of developing this condition again:  For 30 or more minutes every day, do an activity that: ? Involves moving your arms and legs. ? Increases your heart rate.  When traveling for longer than four hours: ? Exercise your arms and legs every hour. ? Drink plenty of water. ? Avoid drinking alcohol.  Avoid sitting or lying for a long time without moving your legs.  If you have surgery or you are hospitalized, ask about ways to prevent blood clots. These may include taking frequent walks or using anticoagulants.  Stay at a healthy weight.  If you are a woman who is older than age 83, avoid unnecessary use of medicines that contain estrogen, such as some birth control pills.  Do not use any products that contain nicotine or tobacco, such as cigarettes and e-cigarettes. This is especially important if you take estrogen medicines. If you need help quitting, ask your health care provider. Contact a health care provider if:  You miss a dose of your blood thinner.  Your menstrual period is heavier than usual.  You have unusual bruising. Get help right away  if:  You have: ? New or increased pain, swelling, or redness in an arm or leg. ? Numbness or tingling in an arm or leg. ? Shortness of breath. ? Chest pain. ? A rapid or irregular heartbeat. ? A severe headache or confusion. ? A cut that will not stop bleeding.  There is blood in your vomit, stool, or urine.  You have a serious fall or accident, or you hit your head.  You feel light-headed or dizzy.  You cough up blood. These symptoms may represent a serious problem that is an emergency. Do not wait to see if the symptoms will go away. Get medical help right away. Call your local emergency services (911 in the [place] Do not drive yourself to the hospital. Summary  Deep vein thrombosis (DVT) is a condition in which a blood clot forms in a deep vein, such as a lower leg, thigh, or arm vein.  Symptoms can include swelling, warmth, pain, and redness in your leg or arm.  This condition may be treated with a blood thinner (  anticoagulant medicine), medicine that is injected to dissolve blood clots,compression stockings, or surgery.  If you are prescribed blood thinners, take them exactly as told. This information is not intended to replace advice given to you by your health care provider. Make sure you discuss any questions you have with your health care provider. Document Revised: 09/09/2017 Document Reviewed: 02/25/2017 Elsevier Patient Education  [place].

## 2019-10-19 NOTE — Progress Notes (Signed)
   Subjective:   Patient ID: Abigail [name], female    DOB: 1966-05-02, 54 y.o.   MRN: [id number]  HPI The patient is a 54 YO female coming in for left arm pain. Ongoing since Sept/Oct. Referred to sports medicine and has also seen orthopedics. Had been treated with PT/medications without relief. Went to ER on 10/14/19 for continued pain and swelling without additional treatment. Then back to orthopedics and they did [place] arm on 10/17/19 which showed age indeterminate blood clot in the arm. Denies family history of blood clots that she is aware of. Non-smoker. Not taking any hormone medication. Started eliquis 2 BID day of the scan. Denies bleeding or bruising or blood in stool.   Review of Systems  Constitutional: Negative.   HENT: Negative.   Eyes: Negative.   Respiratory: Negative for cough, chest tightness and shortness of breath.   Cardiovascular: Negative for chest pain, palpitations and leg swelling.  Gastrointestinal: Negative for abdominal distention, abdominal pain, constipation, diarrhea, nausea and vomiting.  Musculoskeletal: Positive for myalgias.       Left arm swelling  Skin: Negative.   Neurological: Negative.   Psychiatric/Behavioral: Negative.     Objective:  Physical Exam Constitutional:      Appearance: She is well-developed.  HENT:     Head: Normocephalic and atraumatic.  Cardiovascular:     Rate and Rhythm: Normal rate and regular rhythm.  Pulmonary:     Effort: Pulmonary effort is normal. No respiratory distress.     Breath sounds: Normal breath sounds. No wheezing or rales.  Abdominal:     General: Bowel sounds are normal. There is no distension.     Palpations: Abdomen is soft.     Tenderness: There is no abdominal tenderness. There is no rebound.  Musculoskeletal:        General: Swelling and tenderness present.     Cervical back: Normal range of motion.     Comments: LUE swelling and pain  Skin:    General: Skin is warm and dry.  Neurological:     Mental  Status: She is alert and oriented to person, place, and time.     Coordination: Coordination normal.     Vitals:   10/19/19 1026  BP: 124/82  Pulse: 96  Temp: 98.3 F (36.8 C)  TempSrc: Oral  SpO2: 96%  Weight: 162 lb (73.5 kg)  Height: 5\' 1"  (1.549 m)    This visit occurred during the SARS-CoV-2 public health emergency.  Safety protocols were in place, including screening questions prior to the visit, additional usage of staff PPE, and extensive cleaning of exam room while observing appropriate contact time as indicated for disinfecting solutions.   Assessment & Plan:  Visit time 25 minutes in face to face communication with patient and coordination of care, additional 15 minutes spent in record review, coordination or care, ordering tests, communicating/referring to other healthcare professionals, documenting in medical records all on the same day of the visit for total time 40 minutes spent on the visit.

## 2019-10-19 NOTE — Assessment & Plan Note (Signed)
Taking eliquis starter pack currently. Rx eliquis BID 2 month supply for total of 3 months of therapy. Checking hypercoagulable panel given location of blood clot. No family history or known triggers. Depending on results may need more extended course. Likely to be thoracic outlet compression. Advised to get compression sleeve for arm to help with swelling. Advised not to take NSAIDs while on eliquis (examples given) and can take tylenol for pain.

## 2019-10-24 LAB — BETA-2-GLYCOPROTEIN I ABS, IGG/M/A
Beta-2 Glyco 1 IgA: 9 GPI IgA units (ref 0–25)
Beta-2 Glyco 1 IgM: <9 GPI IgM units (ref 0–32)
Beta-2 Glyco I IgG: <9 GPI IgG units (ref 0–20)

## 2019-10-24 LAB — PROTEIN C ACTIVITY: Protein C Activity: 176 % (ref 70–180)

## 2019-10-24 LAB — CARDIOLIPIN ANTIBODIES, IGG, IGM, IGA
Anticardiolipin IgA: <11 [APL'U]
Anticardiolipin IgG: <14 [GPL'U]
Anticardiolipin IgM: <12 [MPL'U]

## 2019-10-24 LAB — PROTEIN C, TOTAL: PROTEIN C, ANTIGEN: 109 % (ref 70–140)

## 2019-10-24 LAB — PROTHROMBIN GENE MUTATION

## 2019-10-24 LAB — ANTITHROMBIN III: AntiThromb III Func: 107 % normal (ref 80–135)

## 2019-10-24 LAB — PROTEIN S, TOTAL: PROTEIN S ANTIGEN, TOTAL: 154 % normal — ABNORMAL HIGH (ref 70–140)

## 2019-10-24 LAB — HOMOCYSTEINE: Homocysteine: 15.1 umol/L — ABNORMAL HIGH (ref <10.4)

## 2019-10-24 LAB — PROTEIN S ACTIVITY: Protein S Activity: 128 % (ref 60–140)

## 2019-10-24 LAB — FACTOR 5 LEIDEN

## 2019-10-25 ENCOUNTER — Inpatient Hospital Stay (HOSPITAL_COMMUNITY)
Admission: EM | Admit: 2019-10-25 | Discharge: 2019-10-31 | DRG: 300 | Disposition: A | Discharge status: Home Health | Payer: Managed Care, Other (non HMO) | Source: Ambulatory Visit | Attending: Internal Medicine | Admitting: Internal Medicine

## 2019-10-25 ENCOUNTER — Emergency Department (HOSPITAL_COMMUNITY): Payer: Managed Care, Other (non HMO)

## 2019-10-25 ENCOUNTER — Emergency Department (HOSPITAL_COMMUNITY)
Admit: 2019-10-25 | Discharge: 2019-10-25 | Disposition: A | Discharge status: Home / Self Care | Payer: Managed Care, Other (non HMO) | Attending: Emergency Medicine | Admitting: Emergency Medicine

## 2019-10-25 ENCOUNTER — Other Ambulatory Visit: Payer: Self-pay

## 2019-10-25 ENCOUNTER — Encounter (HOSPITAL_COMMUNITY): Payer: Self-pay | Admitting: Emergency Medicine

## 2019-10-25 ENCOUNTER — Telehealth: Payer: Self-pay | Admitting: Hematology

## 2019-10-25 DIAGNOSIS — E785 Hyperlipidemia, unspecified: Secondary | ICD-10-CM | POA: Diagnosis present

## 2019-10-25 DIAGNOSIS — I82A12 Acute embolism and thrombosis of left axillary vein: Secondary | ICD-10-CM | POA: Diagnosis not present

## 2019-10-25 DIAGNOSIS — E876 Hypokalemia: Secondary | ICD-10-CM | POA: Diagnosis present

## 2019-10-25 DIAGNOSIS — D630 Anemia in neoplastic disease: Secondary | ICD-10-CM | POA: Diagnosis present

## 2019-10-25 DIAGNOSIS — Z882 Allergy status to sulfonamides status: Secondary | ICD-10-CM | POA: Diagnosis not present

## 2019-10-25 DIAGNOSIS — C7951 Secondary malignant neoplasm of bone: Secondary | ICD-10-CM | POA: Diagnosis present

## 2019-10-25 DIAGNOSIS — G5630 Lesion of radial nerve, unspecified upper limb: Secondary | ICD-10-CM | POA: Diagnosis present

## 2019-10-25 DIAGNOSIS — C50912 Malignant neoplasm of unspecified site of left female breast: Secondary | ICD-10-CM | POA: Diagnosis present

## 2019-10-25 DIAGNOSIS — R252 Cramp and spasm: Secondary | ICD-10-CM | POA: Diagnosis not present

## 2019-10-25 DIAGNOSIS — Z7901 Long term (current) use of anticoagulants: Secondary | ICD-10-CM | POA: Diagnosis not present

## 2019-10-25 DIAGNOSIS — Z833 Family history of diabetes mellitus: Secondary | ICD-10-CM

## 2019-10-25 DIAGNOSIS — G5632 Lesion of radial nerve, left upper limb: Secondary | ICD-10-CM | POA: Diagnosis present

## 2019-10-25 DIAGNOSIS — M546 Pain in thoracic spine: Secondary | ICD-10-CM | POA: Diagnosis not present

## 2019-10-25 DIAGNOSIS — Z20822 Contact with and (suspected) exposure to covid-19: Secondary | ICD-10-CM | POA: Diagnosis present

## 2019-10-25 DIAGNOSIS — R918 Other nonspecific abnormal finding of lung field: Secondary | ICD-10-CM | POA: Diagnosis present

## 2019-10-25 DIAGNOSIS — Z79899 Other long term (current) drug therapy: Secondary | ICD-10-CM | POA: Diagnosis not present

## 2019-10-25 DIAGNOSIS — O2230 Deep phlebothrombosis in pregnancy, unspecified trimester: Secondary | ICD-10-CM | POA: Diagnosis present

## 2019-10-25 DIAGNOSIS — I82622 Acute embolism and thrombosis of deep veins of left upper extremity: Secondary | ICD-10-CM | POA: Diagnosis present

## 2019-10-25 DIAGNOSIS — Z8249 Family history of ischemic heart disease and other diseases of the circulatory system: Secondary | ICD-10-CM | POA: Diagnosis not present

## 2019-10-25 DIAGNOSIS — Z6830 Body mass index (BMI) 30.0-30.9, adult: Secondary | ICD-10-CM | POA: Diagnosis not present

## 2019-10-25 DIAGNOSIS — M7989 Other specified soft tissue disorders: Secondary | ICD-10-CM

## 2019-10-25 DIAGNOSIS — Z791 Long term (current) use of non-steroidal anti-inflammatories (NSAID): Secondary | ICD-10-CM | POA: Diagnosis not present

## 2019-10-25 DIAGNOSIS — R591 Generalized enlarged lymph nodes: Secondary | ICD-10-CM

## 2019-10-25 DIAGNOSIS — C787 Secondary malignant neoplasm of liver and intrahepatic bile duct: Secondary | ICD-10-CM | POA: Diagnosis present

## 2019-10-25 DIAGNOSIS — R52 Pain, unspecified: Secondary | ICD-10-CM

## 2019-10-25 DIAGNOSIS — J189 Pneumonia, unspecified organism: Secondary | ICD-10-CM | POA: Diagnosis present

## 2019-10-25 DIAGNOSIS — N6320 Unspecified lump in the left breast, unspecified quadrant: Secondary | ICD-10-CM | POA: Diagnosis present

## 2019-10-25 LAB — CBC WITH DIFFERENTIAL/PLATELET
Abs Immature Granulocytes: 0.25 10*3/uL — ABNORMAL HIGH (ref 0.00–0.07)
Basophils Absolute: 0.1 10*3/uL (ref 0.0–0.1)
Basophils Relative: 1 %
Eosinophils Absolute: 0.5 10*3/uL (ref 0.0–0.5)
Eosinophils Relative: 5 %
HCT: 32.7 % — ABNORMAL LOW (ref 36.0–46.0)
Hemoglobin: 9.9 g/dL — ABNORMAL LOW (ref 12.0–15.0)
Immature Granulocytes: 3 %
Lymphocytes Relative: 19 %
Lymphs Abs: 1.7 10*3/uL (ref 0.7–4.0)
MCH: 27.7 pg (ref 26.0–34.0)
MCHC: 30.3 g/dL (ref 30.0–36.0)
MCV: 91.6 fL (ref 80.0–100.0)
Monocytes Absolute: 0.8 10*3/uL (ref 0.1–1.0)
Monocytes Relative: 9 %
Neutro Abs: 5.8 10*3/uL (ref 1.7–7.7)
Neutrophils Relative %: 63 %
Platelets: 294 10*3/uL (ref 150–400)
RBC: 3.57 MIL/uL — ABNORMAL LOW (ref 3.87–5.11)
RDW: 14.5 % (ref 11.5–15.5)
WBC: 9.1 10*3/uL (ref 4.0–10.5)
nRBC: 2.2 % — ABNORMAL HIGH (ref 0.0–0.2)

## 2019-10-25 LAB — COMPREHENSIVE METABOLIC PANEL
ALT: 74 U/L — ABNORMAL HIGH (ref 0–44)
AST: 47 U/L — ABNORMAL HIGH (ref 15–41)
Albumin: 3.7 g/dL (ref 3.5–5.0)
Alkaline Phosphatase: 364 U/L — ABNORMAL HIGH (ref 38–126)
Anion gap: 14 (ref 5–15)
BUN: 16 mg/dL (ref 6–20)
CO2: 27 mmol/L (ref 22–32)
Calcium: 10 mg/dL (ref 8.9–10.3)
Chloride: 98 mmol/L (ref 98–111)
Creatinine, Ser: 0.8 mg/dL (ref 0.44–1.00)
GFR calc Af Amer: >60 mL/min (ref >60)
GFR calc non Af Amer: >60 mL/min (ref >60)
Glucose, Bld: 124 mg/dL — ABNORMAL HIGH (ref 70–99)
Potassium: 3.3 mmol/L — ABNORMAL LOW (ref 3.5–5.1)
Sodium: 139 mmol/L (ref 135–145)
Total Bilirubin: 0.3 mg/dL (ref 0.3–1.2)
Total Protein: 8 g/dL (ref 6.5–8.1)

## 2019-10-25 LAB — APTT: aPTT: 30 seconds (ref 24–36)

## 2019-10-25 LAB — HEPARIN LEVEL (UNFRACTIONATED): Heparin Unfractionated: >2.2 IU/mL — ABNORMAL HIGH (ref 0.30–0.70)

## 2019-10-25 IMAGING — CR DG CHEST 2V
2 series · 2 of 2 positions shown · non-contrast
Comparison: None.

CLINICAL DATA: Left upper extremity region pain and edema

EXAM:
CHEST - 2 VIEW

[w chest pa]
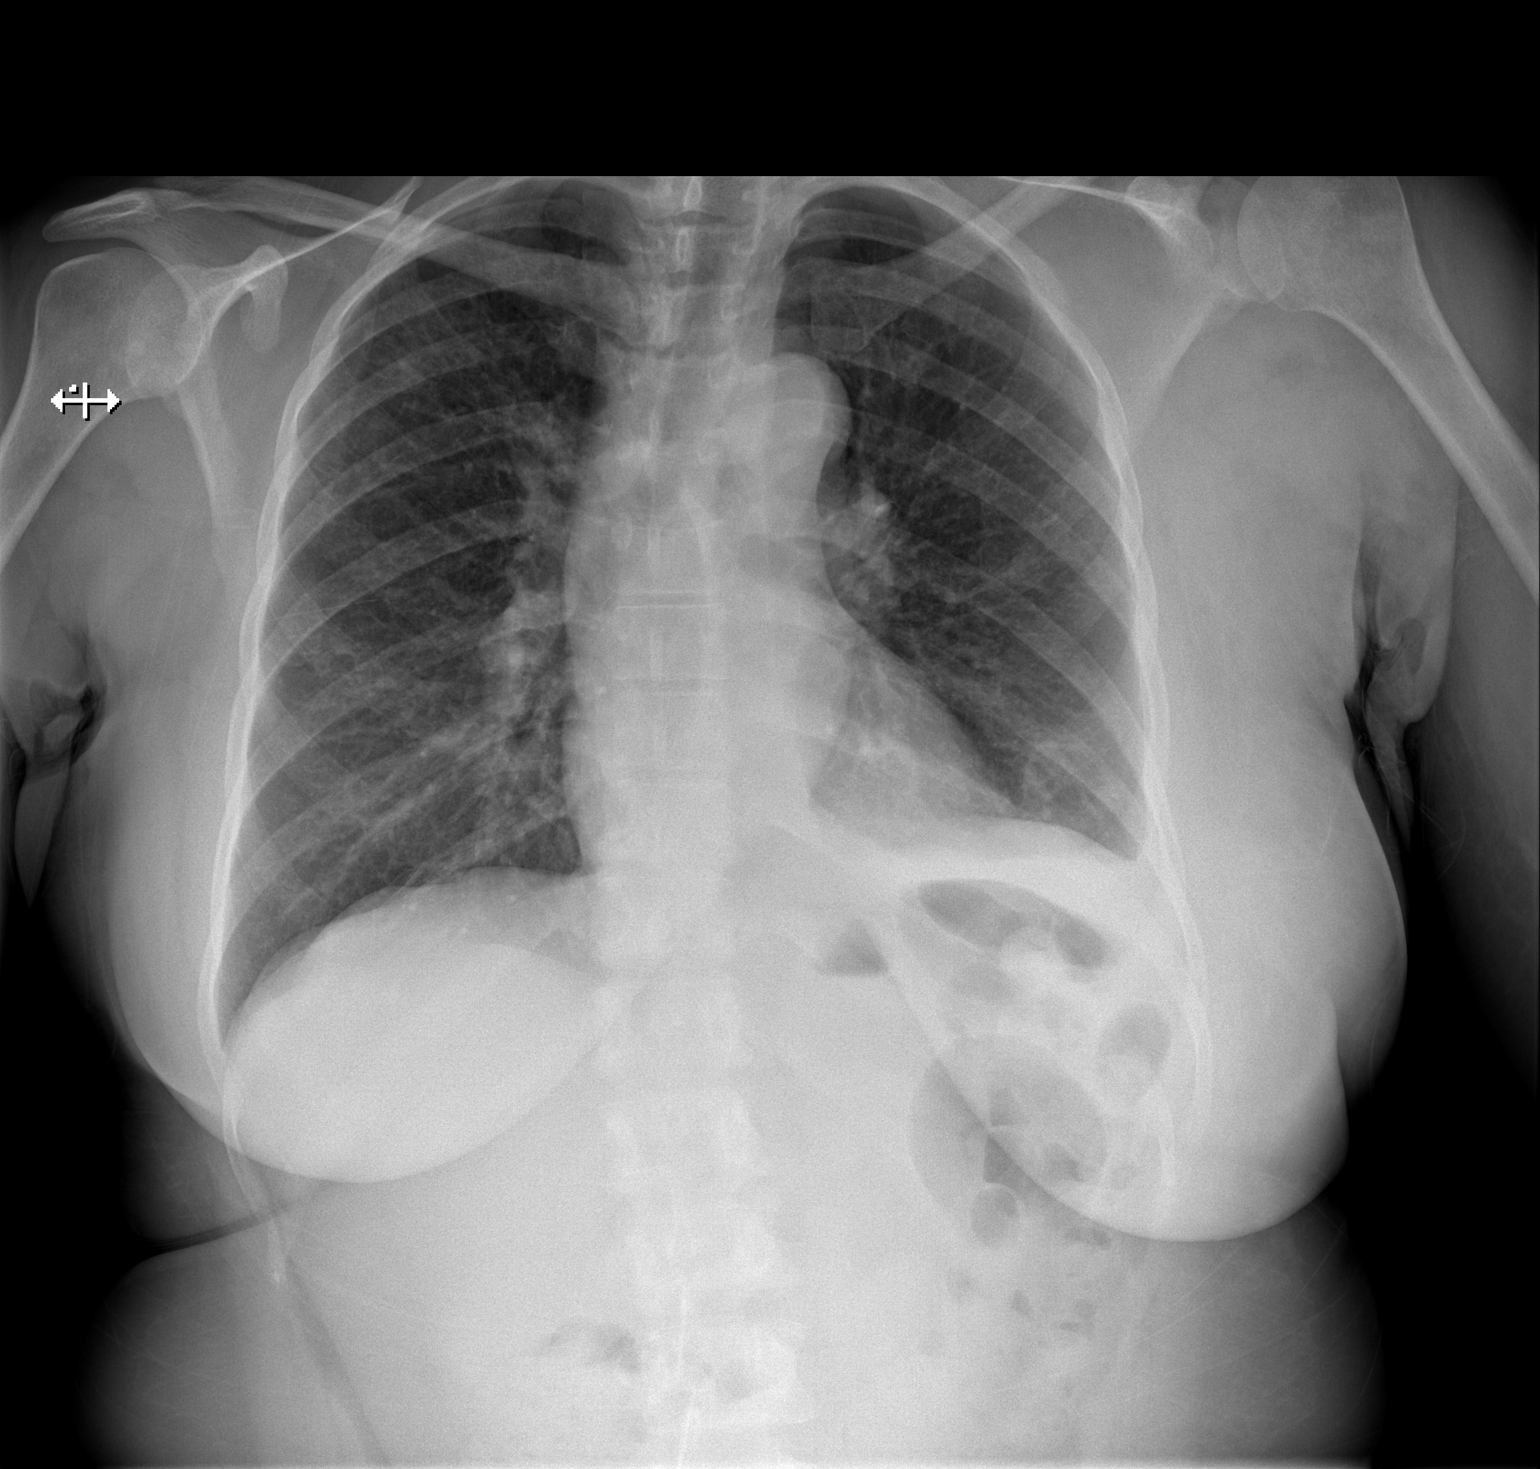

[w chest lat]
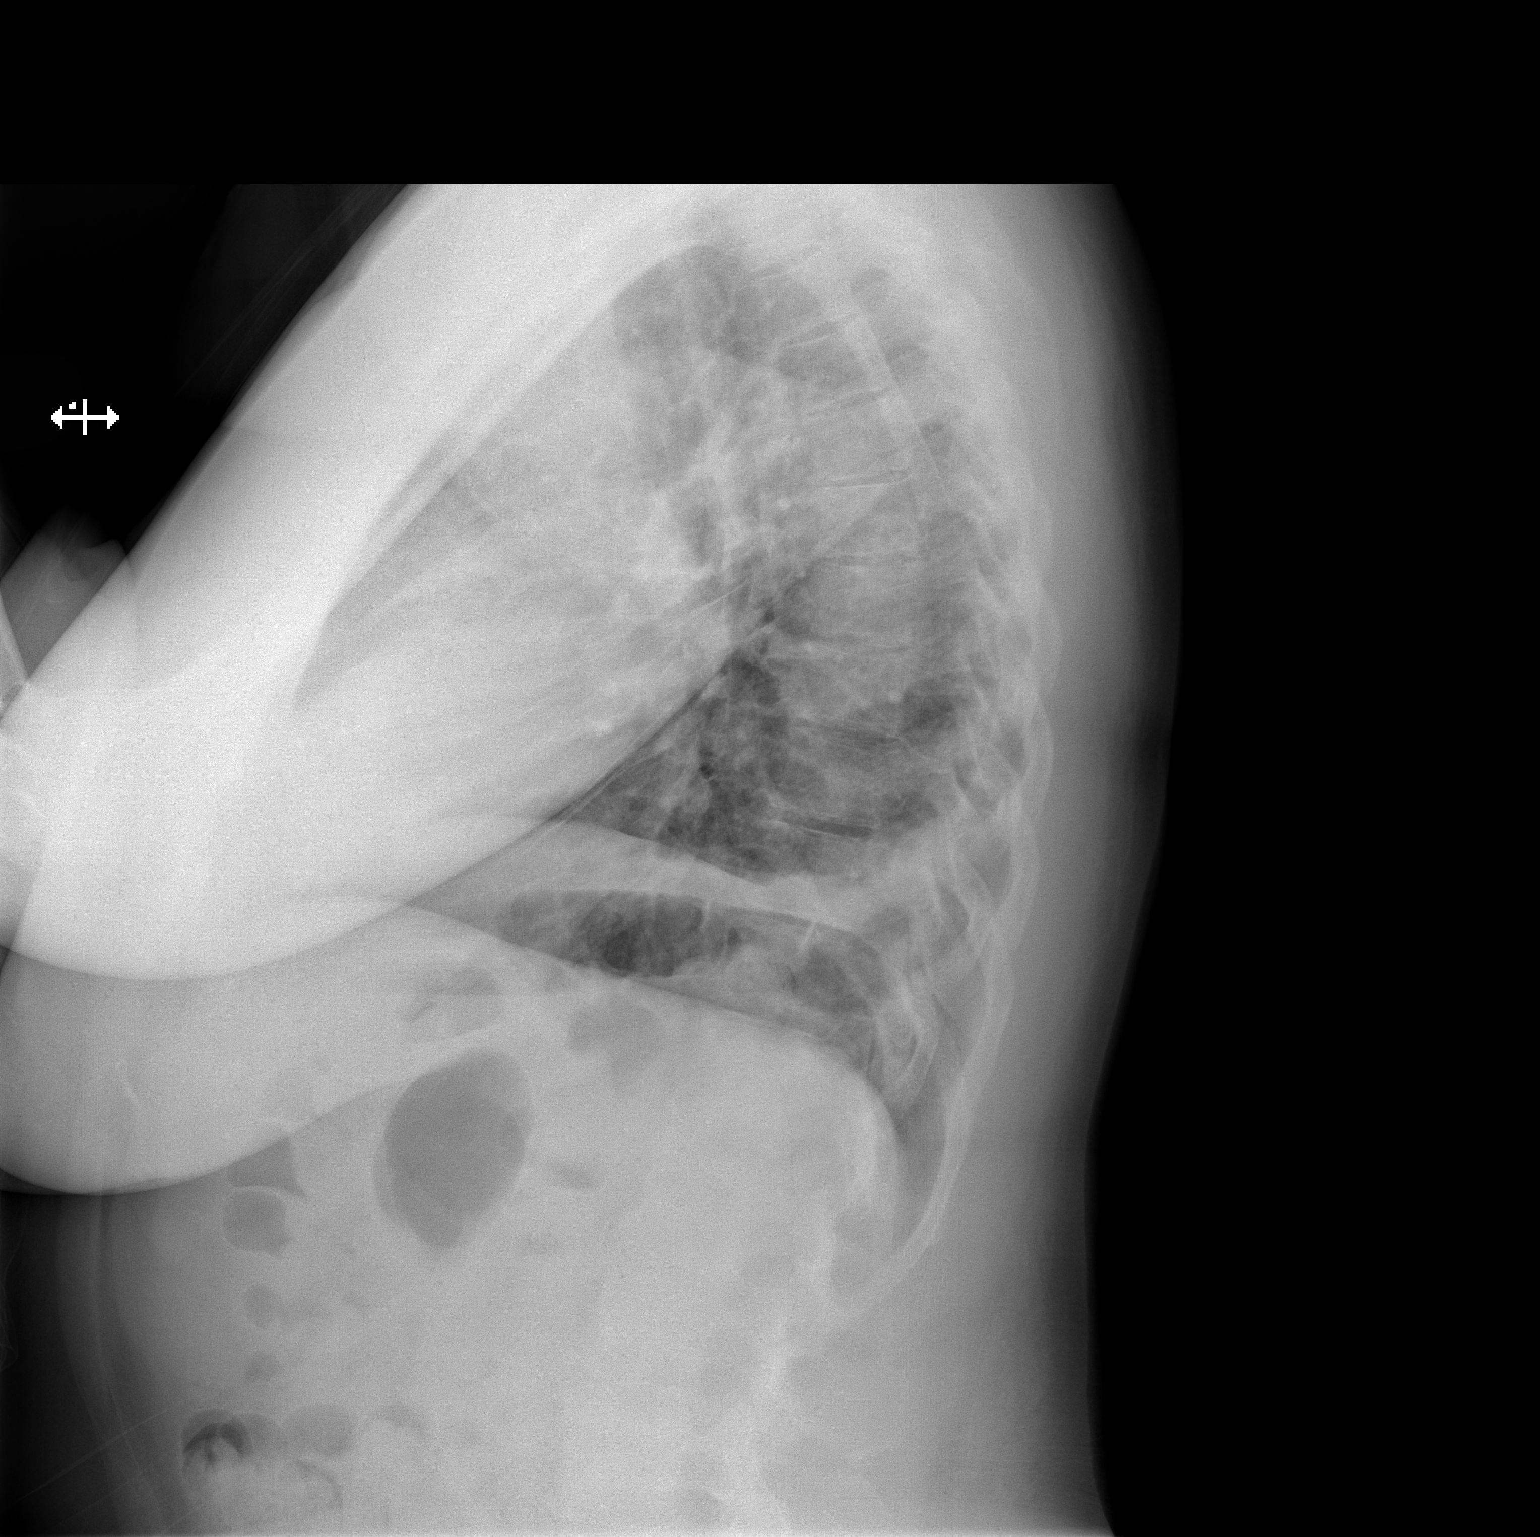

[2 of 2 positions shown; findings below may reference images not displayed]

FINDINGS: There is atelectatic change in the left base with small left pleural
effusion. There is subtle increased opacity in the right upper lobe
which may represent earliest changes of pneumonia.

Lungs elsewhere clear. Heart size and pulmonary vascularity are
normal. No adenopathy. No bone lesions. No pneumothorax.

There is soft tissue swelling in the visualized left upper
extremity.
IMPRESSION: 1.  Small left pleural effusion with left base atelectasis.

2. Suspect earliest changes of pneumonia right upper lobe. Question
atypical organism pneumonia.

3.  Cardiac silhouette normal.

4.  No adenopathy.

5.  Soft tissue swelling of visualized left upper extremity.

## 2019-10-25 IMAGING — CT CT ANGIO CHEST
2 of 6 series · 17 of 46 positions shown · IV contrast (APPLIED)
Comparison: None.

CLINICAL DATA: Swelling and pain in left upper extremity with blood
clot. Left upper back pain.

EXAM:
CT ANGIOGRAPHY CHEST WITH CONTRAST
TECHNIQUE: Multidetector CT imaging of the chest was performed using the
standard protocol during bolus administration of intravenous
contrast. Multiplanar CT image reconstructions and MIPs were
obtained to evaluate the vascular anatomy.
CONTRAST:  75mL OMNIPAQUE IOHEXOL 350 MG/ML SOLN

[Series 5: thins · axial · 0.67mm/px · z∈[-457,-237]mm · 15 of 242 slices shown]
[im 11/242  lung]
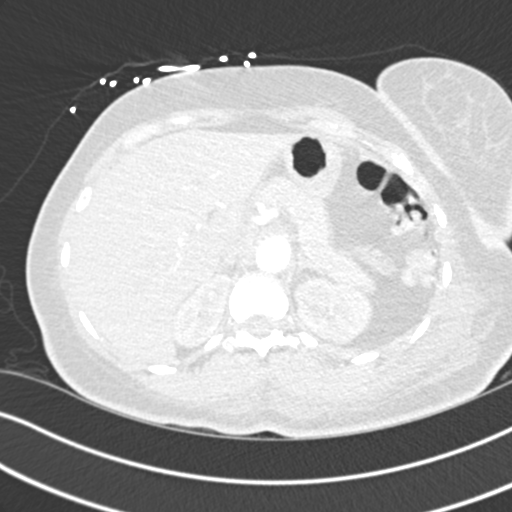
[im 32/242  soft-tissue]
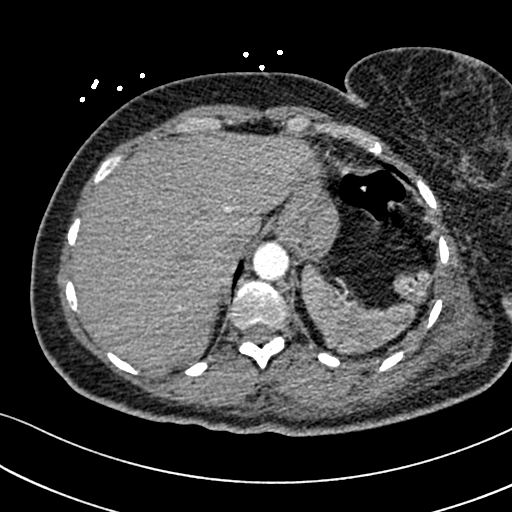
[im 42/242  lung]
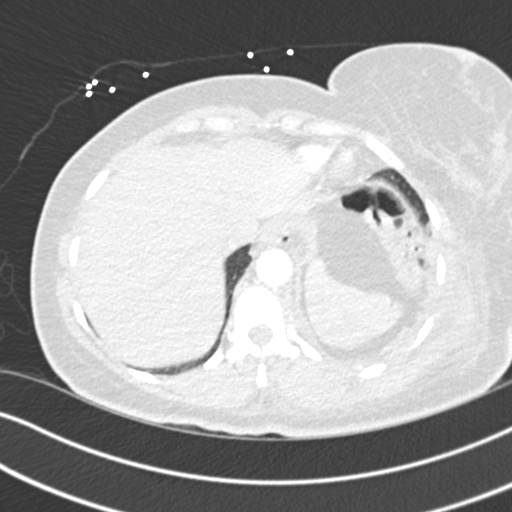
[im 63/242  soft-tissue]
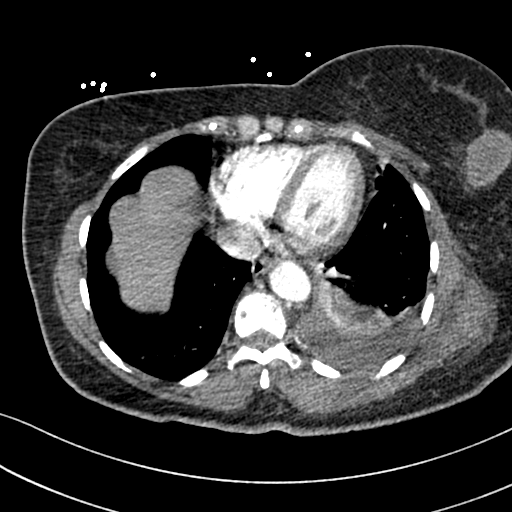
[im 74/242  lung]
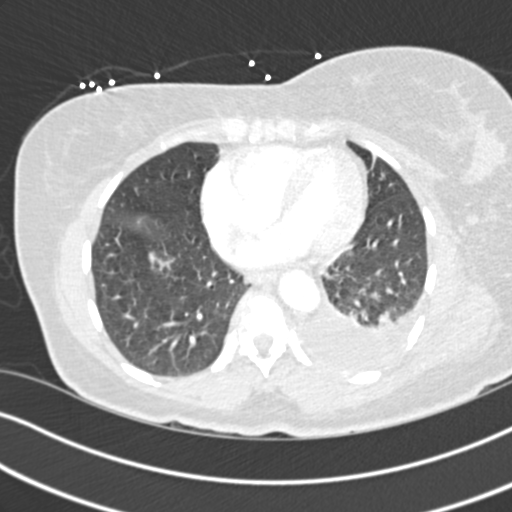
[im 95/242  soft-tissue]
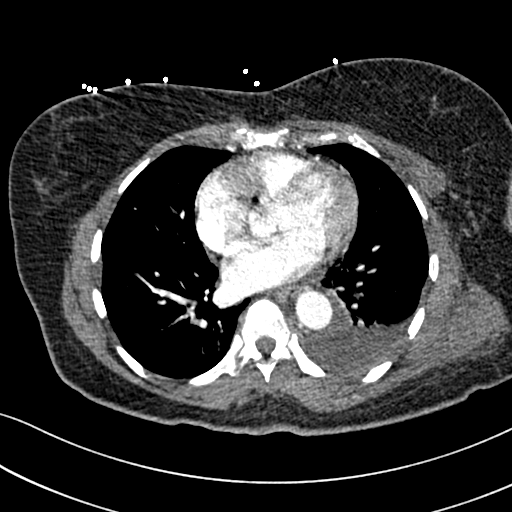
[im 105/242  lung]
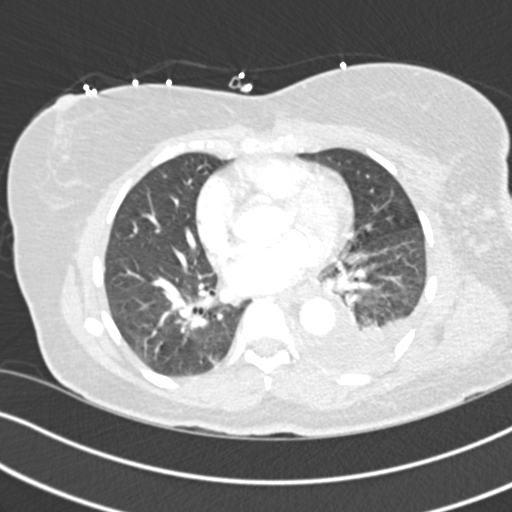
[im 126/242  soft-tissue]
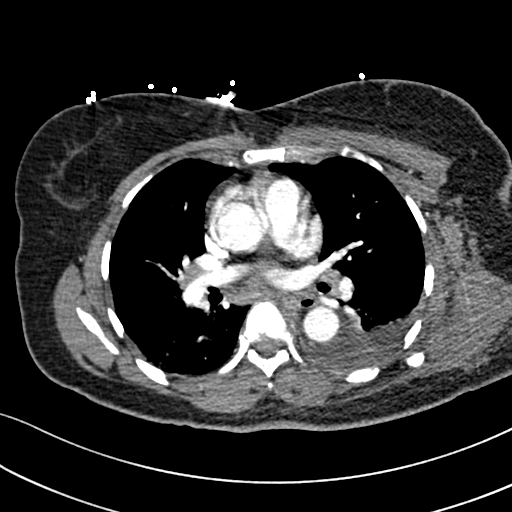
[im 137/242  lung]
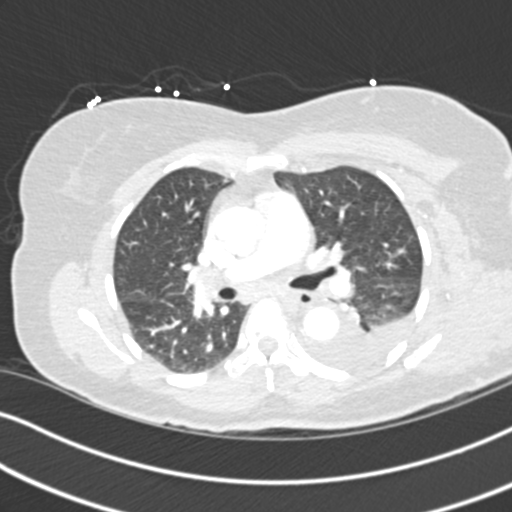
[im 147/242  soft-tissue]
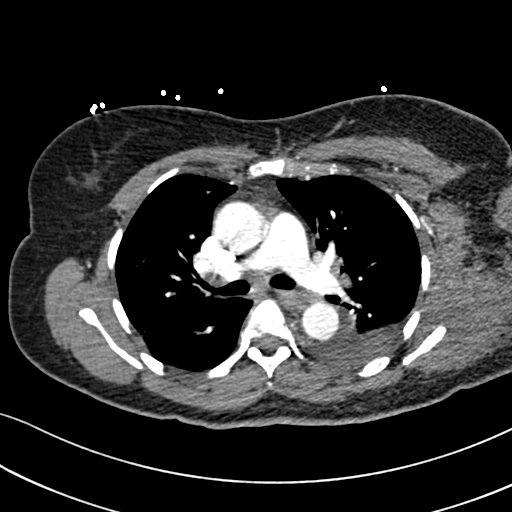
[im 168/242  lung]
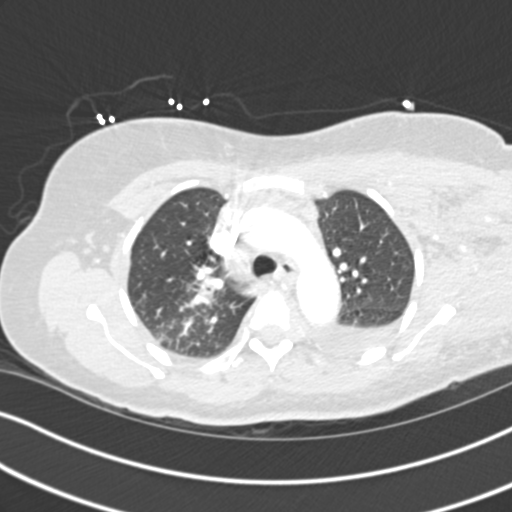
[im 179/242  soft-tissue]
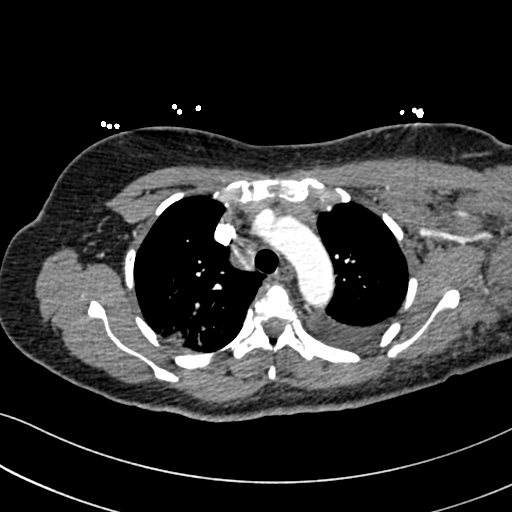
[im 200/242  lung]
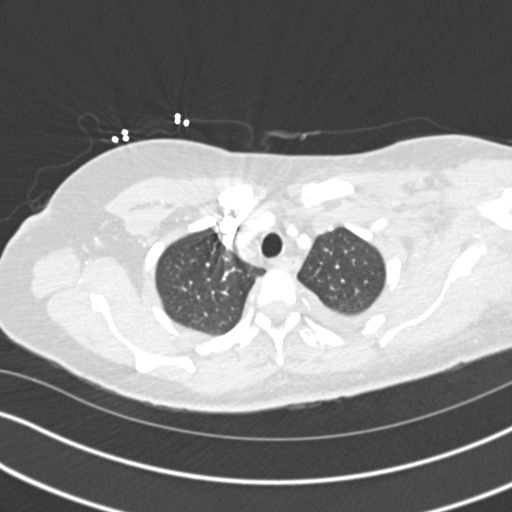
[im 210/242  soft-tissue]
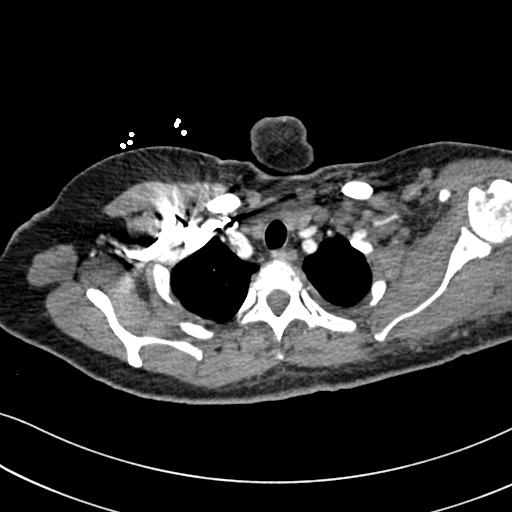
[im 231/242  lung]
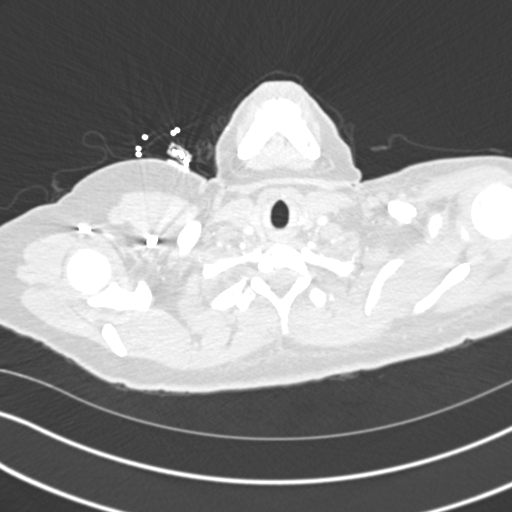

[Series 7: coronal mpr · coronal · 0.49mm/px · 2 of 83 slices shown]
[im 28/83  soft-tissue]
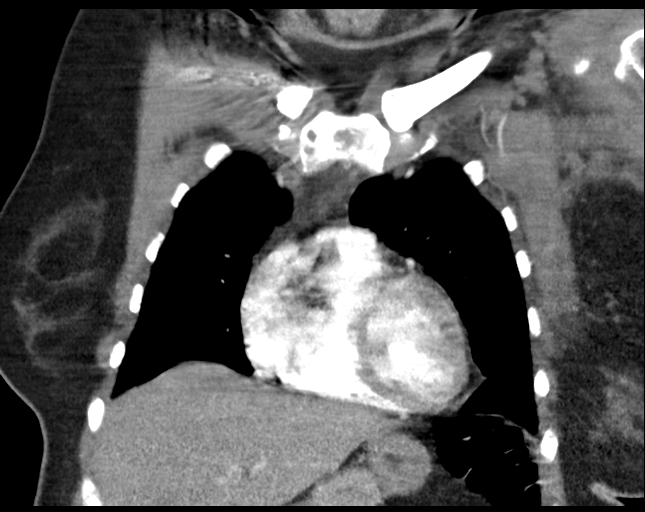
[im 55/83  soft-tissue]
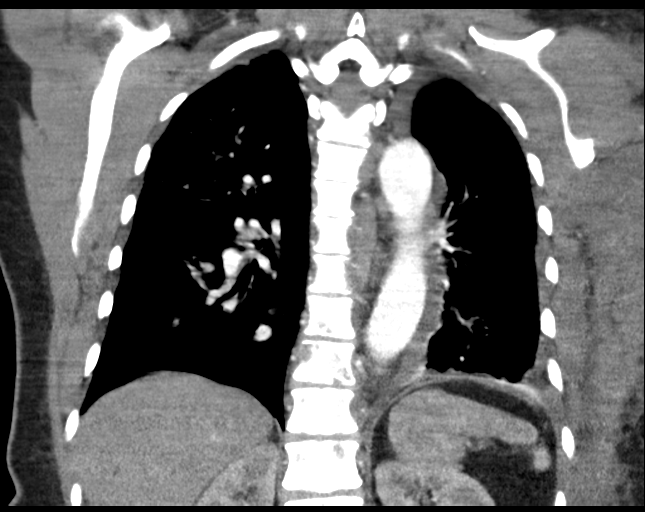

[17 of 46 positions shown; findings below may reference images not displayed]

FINDINGS: Cardiovascular: No filling defects in the pulmonary arteries to
suggest pulmonary emboli. The given history of a left axillary vein
thrombus cannot be visualized on this study. Heart is normal size.
Aorta is normal caliber.

Mediastinum/Nodes: There is abnormal soft tissue in the left axilla
measuring 4.1 x 2.3 cm on image 30. This is presumably conglomerate
nodal mass. Borderline sized mediastinal lymph nodes. Prevascular
lymph node measures 11 mm. Enlarged right paratracheal lymph node
measures 15 mm.

Lungs/Pleura: Moderate-sized left pleural effusion. Atelectasis in
the left lower lobe. Ground-glass airspace opacity in the posterior
right upper lobe could reflect early infiltrate. Scattered small
nodules within the lungs. 3 mm nodule in the right upper lobe on
image 41. 3 mm nodule in the left upper lobe on image 47. Clustered
nodules seen posteriorly in the superior segment of the right lower
lobe.

Upper Abdomen: Imaging into the upper abdomen shows no acute
findings.

Musculoskeletal: Rounded masslike density seen in the left breast
measuring 4.2 x 2.9 cm. Recommend correlation with mammography.
Mixed lytic and sclerotic lesions diffusely throughout the
visualized spine as well as turn Um and manubrium, left scapula,
bilateral humeral heads and multiple bilateral ribs compatible with
osseous metastatic disease.

Review of the MIP images confirms the above findings.
IMPRESSION: No evidence of pulmonary embolus.

Rounded soft tissue mass in the left breast. Cannot exclude breast
cancer given the below findings. Recommend correlation with
mammography.

Numerous left axillary lymph nodes with conglomerate nodal mass.
There is abnormal appearance of the left [REDACTED] and
muscles surrounding the left scapula concerning for mass/tumor
involvement.

Mild mediastinal adenopathy.

Diffuse sclerotic and lytic lesions throughout the visualized
skeleton compatible with osseous metastatic disease.

Ground-glass airspace opacity in the superior segment of the right
lower lobe with clustered nodular densities. Similar airspace
disease posteriorly in the right upper lobe. Cannot exclude areas of
early pneumonia.

Moderate left pleural effusion. Compressive atelectasis in the left
lower lobe.

These results were called by telephone at the time of interpretation
on [DATE] at [DATE] to provider AXHAR , who verbally
acknowledged these results.

## 2019-10-25 MED ORDER — ACETAMINOPHEN 650 MG RE SUPP: 650.0000 mg | Freq: Four times a day (QID) | RECTAL | Status: DC | PRN

## 2019-10-25 MED ORDER — SODIUM CHLORIDE 0.9 % IV SOLN: INTRAVENOUS | Status: DC

## 2019-10-25 MED ORDER — ACETAMINOPHEN 325 MG PO TABS: 650.0000 mg | ORAL_TABLET | Freq: Four times a day (QID) | ORAL | Status: DC | PRN

## 2019-10-25 MED ORDER — ONDANSETRON HCL 4 MG PO TABS: 4.0000 mg | ORAL_TABLET | Freq: Four times a day (QID) | ORAL | Status: DC | PRN

## 2019-10-25 MED ORDER — SODIUM CHLORIDE 0.9 % IV SOLN
1.0000 g | Freq: Once | INTRAVENOUS | Status: AC
  Administered 2019-10-25: 20:00:00 1 g via INTRAVENOUS
  Filled 2019-10-25: qty 10

## 2019-10-25 MED ORDER — HEPARIN (PORCINE) 25000 UT/250ML-% IV SOLN
1100.0000 [IU]/h | INTRAVENOUS | Status: DC
  Administered 2019-10-25 – 2019-10-27 (×3): 1100 [IU]/h via INTRAVENOUS
  Filled 2019-10-25 (×4): qty 250

## 2019-10-25 MED ORDER — SODIUM CHLORIDE 0.9 % IV SOLN
500.0000 mg | INTRAVENOUS | Status: DC
  Administered 2019-10-26 – 2019-10-27 (×2): 500 mg via INTRAVENOUS
  Filled 2019-10-25 (×2): qty 500

## 2019-10-25 MED ORDER — SODIUM CHLORIDE 0.9 % IV SOLN
1.0000 g | INTRAVENOUS | Status: DC
  Administered 2019-10-26 – 2019-10-28 (×3): 1 g via INTRAVENOUS
  Filled 2019-10-25 (×3): qty 10

## 2019-10-25 MED ORDER — ENOXAPARIN SODIUM 40 MG/0.4ML ~~LOC~~ SOLN: 40.0000 mg | SUBCUTANEOUS | Status: DC

## 2019-10-25 MED ORDER — IOHEXOL 350 MG/ML SOLN
100.0000 mL | Freq: Once | INTRAVENOUS | Status: AC | PRN
  Administered 2019-10-25: 19:00:00 75 mL via INTRAVENOUS

## 2019-10-25 MED ORDER — ONDANSETRON HCL 4 MG/2ML IJ SOLN: 4.0000 mg | Freq: Four times a day (QID) | INTRAMUSCULAR | Status: DC | PRN

## 2019-10-25 MED ORDER — SODIUM CHLORIDE 0.9 % IV SOLN
500.0000 mg | Freq: Once | INTRAVENOUS | Status: AC
  Administered 2019-10-25: 21:00:00 500 mg via INTRAVENOUS
  Filled 2019-10-25: qty 500

## 2019-10-25 MED ORDER — SODIUM CHLORIDE (PF) 0.9 % IJ SOLN
INTRAMUSCULAR | Status: AC
  Filled 2019-10-25: qty 50

## 2019-10-25 MED ORDER — HEPARIN BOLUS VIA INFUSION
2000.0000 [IU] | Freq: Once | INTRAVENOUS | Status: AC
  Administered 2019-10-25: 21:00:00 2000 [IU] via INTRAVENOUS
  Filled 2019-10-25: qty 2000

## 2019-10-25 NOTE — ED Notes (Signed)
Pt states she found out per Dr. Veverly Fells with [facility] on Jan 6th and was informed she had a blood clot in left axillary area. Pt says since then she had increase swelling and pain in the arm. Pt states she also is having progressive weakness in the left upper extremity. She states she experiences sharp shooting pains to the left upper back. Pt denies any chest pain or SOB. Pt denies any dizziness or headaches.

## 2019-10-25 NOTE — Progress Notes (Addendum)
ANTICOAGULATION CONSULT NOTE - Initial Consult  Pharmacy Consult for heparin Indication: DVT  Allergies  Allergen Reactions  . Shellfish Allergy Itching and Swelling  . Sulfamethoxazole     REACTION: swelling    Patient Measurements:   Heparin Dosing Weight: 63.9 kg Vital Signs: Temp: 98.2 F (36.8 C) (01/14 1242) Temp Source: Oral (01/14 1242) BP: 118/92 (01/14 1818) Pulse Rate: 108 (01/14 1818)  Labs: Recent Labs    10/25/19 1345  HGB 9.9*  HCT 32.7*  PLT 294  CREATININE 0.80    Estimated Creatinine Clearance: 74.6 mL/min (by C-G formula based on SCr of 0.8 mg/dL).   Medical History: Past Medical History:  Diagnosis Date  . Allergic rhinitis   . Hyperlipidemia    LDL 150's '11    Assessment:  54 yo with UE DVT Dx by Dr Veverly Fells at [facility] on Jan 6th.  Eliquis 5 mg po bid started 1/8 per med hx. Last dose 1/14 at 07 am. No record of pt getting loading dose of Eliquis (10 mg bid x 7 days) for DVT. SCr 0.8, Hg 9.9, PLTC WNL.  No bleeding reported. Doppler 1/14 confirms acute DVT LUE, subclavian, left axillary, left brachial, left radial and left ulnar veins. 1/14 CTA neg for PE, positive for L breast tissue mass, L axiallary nodal mass, RLL & RUL  ground glass opacity  Goal of Therapy:  Heparin level 0.3-0.7 units/ml Monitor platelets by anticoagulation protocol: Yes   Plan:  Draw baseline heparin level and aPTT as heparin level will be elevated due to recent Eliquis Heparin bolus 2000 units followed by heparin drip at 1100 units/hr Check 6 hour aPTT/heparin level Daily CBC, aPTT/heparin level  Eudelia Bunch, Pharm.D [phone] 10/25/2019 8:11 PM

## 2019-10-25 NOTE — ED Provider Notes (Signed)
[facility] DEPT Provider Note   CSN: [id number] Arrival date & time: 10/25/19  1234     History Chief Complaint  Patient presents with  . Arm Swelling  . Arm Pain    Abigail [name] is a 54 y.o. female who presents to the ED today with complaint of gradual onset, constant, worsening, left upper extremity pain x 1-2 weeks.  Patient reports that she has been dealing with this pain since September when she fell off of her porch and injured her arm.  She was just diagnosed with a blood clot in her left upper arm on 1/11 which was ordered by Dr. Alma Friendly.  She has been on Eliquis and states that the swelling has gotten worse.  Patient states that she saw Dr. Veverly Fells again today for follow up and he was concerned for worsening clot and sent her to the ED for further evaluation.   Discussed case with Dr. Veverly Fells himself - he reports that the patient has a new radial nerve palsy since being seen last week; he is concerned about a compressive type nerve injury and feels she would benefit from admission given failure with outpatient eliquis. It does appear that Dr. Veverly Fells has discussed admission with pt's PCP last week after the ultrasound was obtained with extensive blood clots in LUE but the PCP ultimately decided to treat this outpatient and advised the pt to follow up with orthopedics.   The history is provided by the patient. No language interpreter was used.       Past Medical History:  Diagnosis Date  . Allergic rhinitis   . Hyperlipidemia    LDL 150's '11    Patient Active Problem List   Diagnosis Date Noted  . Acute deep vein thrombosis (DVT) of axillary vein of left upper extremity ([place]) 10/19/2019  . Left arm pain 09/10/2019  . Routine health maintenance 08/04/2011  . Borderline hyperlipidemia 06/05/2010    Past Surgical History:  Procedure Laterality Date  . CESAREAN SECTION       OB History   No obstetric history on file.     Family History    Problem Relation Age of Onset  . Diabetes Mother   . Hypertension Mother   . Hypertension Father   . Hypertension Sister   . Heart disease Sister        MI - no serious damage to heart  . Cancer Neg Hx     Social History   Tobacco Use  . Smoking status: Never Smoker  . Smokeless tobacco: Never Used  Substance Use Topics  . Alcohol use: Yes    Comment: rare use - twice rare  . Drug use: No    Home Medications Prior to Admission medications   Medication Sig Start Date End Date Taking? Authorizing Provider  acetaminophen (TYLENOL) 500 MG tablet Take 500 mg by mouth every 6 (six) hours as needed for mild pain.   Yes [provider]  apixaban (ELIQUIS) 5 MG TABS tablet Take 1 tablet (5 mg total) by mouth 2 (two) times daily. 10/19/19  Yes Hoyt Koch, MD  diclofenac (VOLTAREN) 75 MG EC tablet Take 75 mg by mouth 2 (two) times daily. 10/25/19   [provider]  gabapentin (NEURONTIN) 300 MG capsule One tab PO qHS for a week, then BID for a week, then TID. May double weekly to a max of 3,651m/day Patient not taking: Reported on 10/25/2019 09/17/19   CGregor Hams MD  HYDROcodone-acetaminophen (NORCO/VICODIN)  5-325 MG tablet Take 1-2 tablets by mouth every 6 (six) hours as needed. Patient not taking: Reported on 10/25/2019 10/14/19   Davonna Belling, MD  methocarbamol (ROBAXIN) 500 MG tablet Take 500 mg by mouth 4 (four) times daily. 10/18/19   [provider]    Allergies    Shellfish allergy and Sulfamethoxazole  Review of Systems   Review of Systems  Constitutional: Negative for chills and fever.  Gastrointestinal: Negative for abdominal pain, nausea and vomiting.  Musculoskeletal: Positive for arthralgias and joint swelling.  Skin: Negative for color change and wound.    Physical Exam Updated Vital Signs BP 133/88 (BP Location: Right Arm)   Pulse (!) 106   Temp 98.2 F (36.8 C) (Oral)   Resp 18   SpO2 97%   Physical Exam Vitals and  nursing note reviewed.  Constitutional:      Appearance: She is not ill-appearing or diaphoretic.  HENT:     Head: Normocephalic and atraumatic.  Eyes:     Conjunctiva/sclera: Conjunctivae normal.  Cardiovascular:     Rate and Rhythm: Regular rhythm. Tachycardia present.  Pulmonary:     Effort: Pulmonary effort is normal.     Breath sounds: Normal breath sounds. No wheezing, rhonchi or rales.  Abdominal:     Palpations: Abdomen is soft.     Tenderness: There is no abdominal tenderness. There is no guarding or rebound.  Musculoskeletal:     Cervical back: Neck supple.     Comments: Diffuse swelling of the LUE compared to right although compartments are soft. Tenderness to palpation diffusely. ROM limited with lifting of arm, flexion/extension of elbow, flexion/extension of wrist. Pt unable to make a thumbs up sign. Cap refill < 2 seconds. 2+ radial pulse.   Skin:    General: Skin is warm and dry.  Neurological:     Mental Status: She is alert.     ED Results / Procedures / Treatments   Labs (all labs ordered are listed, but only abnormal results are displayed) Labs Reviewed  CBC WITH DIFFERENTIAL/PLATELET - Abnormal; Notable for the following components:      Result Value   RBC 3.57 (*)    Hemoglobin 9.9 (*)    HCT 32.7 (*)    nRBC 2.2 (*)    Abs Immature Granulocytes 0.25 (*)    All other components within normal limits  COMPREHENSIVE METABOLIC PANEL - Abnormal; Notable for the following components:   Potassium 3.3 (*)    Glucose, Bld 124 (*)    AST 47 (*)    ALT 74 (*)    Alkaline Phosphatase 364 (*)    All other components within normal limits  SARS CORONAVIRUS 2 (TAT 6-24 HRS)    EKG None  Radiology DG Chest 2 View  Result Date: 10/25/2019 CLINICAL DATA:  Left upper extremity region pain and edema EXAM: CHEST - 2 VIEW COMPARISON:  None. FINDINGS: There is atelectatic change in the left base with small left pleural effusion. There is subtle increased opacity in  the right upper lobe which may represent earliest changes of pneumonia. Lungs elsewhere clear. Heart size and pulmonary vascularity are normal. No adenopathy. No bone lesions. No pneumothorax. There is soft tissue swelling in the visualized left upper extremity. IMPRESSION: 1.  Small left pleural effusion with left base atelectasis. 2. Suspect earliest changes of pneumonia right upper lobe. Question atypical organism pneumonia. 3.  Cardiac silhouette normal. 4.  No adenopathy. 5.  Soft tissue swelling of visualized left upper  extremity. Electronically Signed   By: Lowella Grip III M.D.   On: 10/25/2019 14:17   CT Angio Chest PE W/Cm &/Or Wo Cm  Result Date: 10/25/2019 CLINICAL DATA:  Swelling and pain in left upper extremity with blood clot. Left upper back pain. EXAM: CT ANGIOGRAPHY CHEST WITH CONTRAST TECHNIQUE: Multidetector CT imaging of the chest was performed using the standard protocol during bolus administration of intravenous contrast. Multiplanar CT image reconstructions and MIPs were obtained to evaluate the vascular anatomy. CONTRAST:  82m OMNIPAQUE IOHEXOL 350 MG/ML SOLN COMPARISON:  None. FINDINGS: Cardiovascular: No filling defects in the pulmonary arteries to suggest pulmonary emboli. The given history of a left axillary vein thrombus cannot be visualized on this study. Heart is normal size. Aorta is normal caliber. Mediastinum/Nodes: There is abnormal soft tissue in the left axilla measuring 4.1 x 2.3 cm on image 30. This is presumably conglomerate nodal mass. Borderline sized mediastinal lymph nodes. Prevascular lymph node measures 11 mm. Enlarged right paratracheal lymph node measures 15 mm. Lungs/Pleura: Moderate-sized left pleural effusion. Atelectasis in the left lower lobe. Ground-glass airspace opacity in the posterior right upper lobe could reflect early infiltrate. Scattered small nodules within the lungs. 3 mm nodule in the right upper lobe on image 41. 3 mm nodule in the left  upper lobe on image 47. Clustered nodules seen posteriorly in the superior segment of the right lower lobe. Upper Abdomen: Imaging into the upper abdomen shows no acute findings. Musculoskeletal: Rounded masslike density seen in the left breast measuring 4.2 x 2.9 cm. Recommend correlation with mammography. Mixed lytic and sclerotic lesions diffusely throughout the visualized spine as well as turn Um and manubrium, left scapula, bilateral humeral heads and multiple bilateral ribs compatible with osseous metastatic disease. Review of the MIP images confirms the above findings. IMPRESSION: No evidence of pulmonary embolus. Rounded soft tissue mass in the left breast. Cannot exclude breast cancer given the below findings. Recommend correlation with mammography. Numerous left axillary lymph nodes with conglomerate nodal mass. There is abnormal appearance of the left latissimus Dorsey and muscles surrounding the left scapula concerning for mass/tumor involvement. Mild mediastinal adenopathy. Diffuse sclerotic and lytic lesions throughout the visualized skeleton compatible with osseous metastatic disease. Ground-glass airspace opacity in the superior segment of the right lower lobe with clustered nodular densities. Similar airspace disease posteriorly in the right upper lobe. Cannot exclude areas of early pneumonia. Moderate left pleural effusion. Compressive atelectasis in the left lower lobe. These results were called by telephone at the time of interpretation on 10/25/2019 at 7:19 pm to provider Corbett Moulder , who verbally acknowledged these results. Electronically Signed   By: KRolm BaptiseM.D.   On: 10/25/2019 19:19   UE VENOUS DUPLEX ([facility] & [facility] 7 am - 7 pm)  Result Date: 10/25/2019 UPPER VENOUS STUDY  Indications: Swelling Risk Factors: DVT. Anticoagulation: Eliquis. Limitations: Body habitus, poor ultrasound/tissue interface and patient pain tolerance. Comparison Study: 10/17/2019-age indeterminate DVT left upper  extremity Performing Technologist: GOliver HumRVT  Examination Guidelines: A complete evaluation includes B-mode imaging, spectral Doppler, color Doppler, and power Doppler as needed of all accessible portions of each vessel. Bilateral testing is considered an integral part of a complete examination. Limited examinations for reoccurring indications may be performed as noted.  Right Findings: +----------+------------+---------+-----------+----------+-------+ RIGHT     CompressiblePhasicitySpontaneousPropertiesSummary +----------+------------+---------+-----------+----------+-------+ Subclavian    Full       Yes       Yes                      +----------+------------+---------+-----------+----------+-------+  Left Findings: +----------+------------+---------+-----------+----------+--------------+ LEFT      CompressiblePhasicitySpontaneousProperties   Summary     +----------+------------+---------+-----------+----------+--------------+ IJV           Full       Yes       Yes                             +----------+------------+---------+-----------+----------+--------------+ Subclavian    None       No        No                   Acute      +----------+------------+---------+-----------+----------+--------------+ Axillary      None       No        No                   Acute      +----------+------------+---------+-----------+----------+--------------+ Brachial      None                 No                   Acute      +----------+------------+---------+-----------+----------+--------------+ Radial        None                                      Acute      +----------+------------+---------+-----------+----------+--------------+ Ulnar         None                                      Acute      +----------+------------+---------+-----------+----------+--------------+ Cephalic      Full                                                  +----------+------------+---------+-----------+----------+--------------+ Basilic                                             Not visualized +----------+------------+---------+-----------+----------+--------------+  Summary:  Right: No evidence of thrombosis in the subclavian.  Left: No evidence of superficial vein thrombosis in the upper extremity. Findings consistent with acute deep vein thrombosis involving the left subclavian vein, left axillary vein, left brachial veins, left radial veins and left ulnar veins. Unable to visualize basilic vein.  *See table(s) above for measurements and observations.    Preliminary     Procedures .Critical Care Performed by: Eustaquio Maize, PA-C Authorized by: Eustaquio Maize, PA-C   Critical care provider statement:    Critical care time (minutes):  45   Critical care was time spent personally by me on the following activities:  Discussions with consultants, evaluation of patient's response to treatment, examination of patient, ordering and performing treatments and interventions, ordering and review of laboratory studies, ordering and review of radiographic studies, pulse oximetry, re-evaluation of patient's condition, obtaining history from patient or surrogate and review of old charts   (including critical care time)  Medications Ordered in ED Medications  sodium chloride (  PF) 0.9 % injection (has no administration in time range)  cefTRIAXone (ROCEPHIN) 1 g in sodium chloride 0.9 % 100 mL IVPB (1 g Intravenous New Bag/Given 10/25/19 2003)  azithromycin (ZITHROMAX) 500 mg in sodium chloride 0.9 % 250 mL IVPB (has no administration in time range)  heparin ADULT infusion 100 units/mL (25000 units/243m sodium chloride 0.45%) (has no administration in time range)  heparin bolus via infusion 2,000 Units (has no administration in time range)  iohexol (OMNIPAQUE) 350 MG/ML injection 100 mL (75 mLs Intravenous Contrast Given 10/25/19 1855)    ED Course    I have reviewed the triage vital signs and the nursing notes.  Pertinent labs & imaging results that were available during my care of the patient were reviewed by me and considered in my medical decision making (see chart for details).  54year old female presents the ED initially with left arm swelling, being sent by Dr. NGilberto Betteroffice with ERosanne Gutting  Was recently diagnosed with a DVT of the upper extremity on 1/6 and is been treating it with Eliquis twice daily without improvement.  Patient is new radial nerve palsy today which concerned Dr. NVeverly Fellsand I sent him here for further evaluation.  There is concern for compressive type nerve injury.  Exam patient's left arm is significantly more swollen compared to the right.  She has tenderness throughout.  She has limited range of motion of her shoulder, elbow, wrist.  Unable to flex her thumb.  On arrival to the ED patient is afebrile however tachycardic in the 110s.  She is satting 100% on room air.  She has been short of breath with exertion although she relates this more to her upper arm pain than anything else.  Denies chest pain.  Even worsening arm swelling since being on Eliquis will obtain repeat ultrasound at this time, CTA to rule out PE.    Screening labs obtained prior to being seen -CBC without leukocytosis.  Hemoglobin 9.9.  CMP with mild decrease in potassium at 3.3.  Her LFTs are still elevated compared to previous with increased elevation in alk phos at 364.  Most recent 10 months ago does not show any elevation in her LFTs.  She is not having any abdominal pain today.  Does appear that patient had notable lab test done last week to rule out coagulopathy disorders which were all negative.  She has an appointment scheduled with oncology for 1/19 for further evaluation.  No known cancer at this time.   Repeat ultrasound with new acute clots in the radial and ulnar veins.  Chest x-ray with concern for pneumonia in the right  lung.  Received call from radiologist with concern for metastatic cancer with likely source left breast.  It appears that patient has increased lymph nodes on the left side, questionable mass near her scapula, lytic lesions in the bone.  Ammonia also seen on the CT scan.  Will treat with IV antibiotics.  Updated patient on these findings.  She reports that she has not had a screening mammogram/pager 53.  She is denying any history of cancer in her family.   Discussed case with Dr. GLindi Adiemedical oncology who will evaluate patient tomorrow.   GJonelle Sidlewith [facility] hospitalist to admit.  He recommends heparin for new clots.  Will order.     MDM Rules/Calculators/A&P                       Final Clinical Impression(s) / ED Diagnoses Final  diagnoses:  Left arm swelling  Acute deep vein thrombosis (DVT) of left upper extremity, unspecified vein ([place])  Community acquired pneumonia of right lung, unspecified part of lung    Rx / DC Orders ED Discharge Orders    None       Eustaquio Maize, Hershal Coria 10/25/19 2009    Carmin Muskrat, MD 10/25/19 2319

## 2019-10-25 NOTE — H&P (Signed)
History and Physical   Abigail [name] [id number] DOB: 1966-09-05 DOA: 10/25/2019  Referring MD/NP/PA: Dr. Vanita Panda  PCP: Hoyt Koch, MD   Outpatient Specialists: None  Patient coming from: Home  Chief Complaint: Left arm pain  HPI: Abigail [name] is a 54 y.o. female with medical history significant of hyperlipidemia, morbid obesity, who was recently diagnosed with left upper extremity DVT on January 6.  She was seen by orthopedics in addition to her primary care physician..  Patient initiated on Eliquis.  Patient returned today due to worsening function in the hand.  She appears to have new radial nerve palsy and not able to use our wrist.  Suspected compression nerve injury.  Patient was reevaluated and found to have worsening DVT involving the radial and ulnar arteries now.  Patient was found to be tachycardic and suspected PE so CT angiogram of the chest was performed.  This showed evidence of left breast mass with possible metastasis suspected breast cancer at this point.  She was also found to have low hemoglobin and increased alkaline phosphatase.  Patient also appears to have right lung pneumonia.  Dr. Lindi Adie of oncologist is to see patient in the morning.  We will admit patient for further treatment evaluation..  ED Course: Temperature 98.2 blood pressure 106/68 pulse 116 respirate 24 oxygen sat 95% room air.  Sodium 139 potassium 3.3 chloride 98, glucose 124, alkaline phosphate 364.  White count 9.1 hemoglobin 9.9 platelets 294.  COVID-19 screen is pending.  Chest x-ray showed small left pleural effusion with left base atelectasis.  Early changes of pneumonia in the right upper lobe.  CT angiogram of the chest showed no evidence of PE.  Has rounded soft tissue mass in the left breast suspicious for cancer.  Left axillary lymph nodes with nodal mass.  Mild mediastinal adenopathy.  Diffuse sclerotic and lytic lesions throughout the skeleton.  Also groundglass airspace opacity in the  superior segment of the right lower lobe could not exclude pneumonia.  Oncology consult and patient is being admitted to the hospital for treatment.  Doppler ultrasound the left showed findings consistent with acute DVT involving the left subclavian vein left axillary vein left brachial veins left radial veins and left ulnar veins.  Review of Systems: As per HPI otherwise 10 point review of systems negative.    Past Medical History:  Diagnosis Date  . Allergic rhinitis   . Hyperlipidemia    LDL 150's '11    Past Surgical History:  Procedure Laterality Date  . CESAREAN SECTION       reports that she has never smoked. She has never used smokeless tobacco. She reports current alcohol use. She reports that she does not use drugs.  Allergies  Allergen Reactions  . Shellfish Allergy Itching and Swelling  . Sulfamethoxazole     REACTION: swelling    Family History  Problem Relation Age of Onset  . Diabetes Mother   . Hypertension Mother   . Hypertension Father   . Hypertension Sister   . Heart disease Sister        MI - no serious damage to heart  . Cancer Neg Hx      Prior to Admission medications   Medication Sig Start Date End Date Taking? Authorizing Provider  acetaminophen (TYLENOL) 500 MG tablet Take 500 mg by mouth every 6 (six) hours as needed for mild pain.   Yes [provider]  apixaban (ELIQUIS) 5 MG TABS tablet Take 1 tablet (5 mg total)  by mouth 2 (two) times daily. 10/19/19  Yes Hoyt Koch, MD  diclofenac (VOLTAREN) 75 MG EC tablet Take 75 mg by mouth 2 (two) times daily. 10/25/19   [provider]  gabapentin (NEURONTIN) 300 MG capsule One tab PO qHS for a week, then BID for a week, then TID. May double weekly to a max of 3,600mg /day Patient not taking: Reported on 10/25/2019 09/17/19   Gregor Hams, MD  HYDROcodone-acetaminophen (NORCO/VICODIN) 5-325 MG tablet Take 1-2 tablets by mouth every 6 (six) hours as needed. Patient not taking:  Reported on 10/25/2019 10/14/19   Davonna Belling, MD  methocarbamol (ROBAXIN) 500 MG tablet Take 500 mg by mouth 4 (four) times daily. 10/18/19   [provider]    Physical Exam: Vitals:   10/25/19 1242 10/25/19 1623 10/25/19 1818  BP: 126/78 133/88 (!) 118/92  Pulse: (!) 116 (!) 106 (!) 108  Resp: 17 18 (!) 23  Temp: 98.2 F (36.8 C)    TempSrc: Oral    SpO2: 100% 97% 98%      Constitutional: NAD, acutely ill looking, swollen left upper extremity Vitals:   10/25/19 1242 10/25/19 1623 10/25/19 1818  BP: 126/78 133/88 (!) 118/92  Pulse: (!) 116 (!) 106 (!) 108  Resp: 17 18 (!) 23  Temp: 98.2 F (36.8 C)    TempSrc: Oral    SpO2: 100% 97% 98%   Eyes: PERRL, lids and conjunctivae normal ENMT: Mucous membranes are moist. Posterior pharynx clear of any exudate or lesions.Normal dentition.  Neck: normal, supple, no masses, no thyromegaly Respiratory: clear to auscultation bilaterally, no wheezing, no crackles. Normal respiratory effort. No accessory muscle use.  Cardiovascular: Sinus tachycardia, no murmurs / rubs / gallops.  Left upper extremity edema all the way to the shoulder, tender, decreased range of motion at the wrist and elbow 1+ pedal pulses. No carotid bruits.  Abdomen: no tenderness, no masses palpated. No hepatosplenomegaly. Bowel sounds positive.  Musculoskeletal: Good ROM, no contractures. Normal muscle tone.  Skin: no rashes, lesions, ulcers. No induration Neurologic: CN 2-12 grossly intact. Sensation intact, DTR normal. Strength 5/5 in all 4.  Psychiatric: Normal judgment and insight. Alert and oriented x 3. Normal mood.     Labs on Admission: I have personally reviewed following labs and imaging studies  CBC: Recent Labs  Lab 10/25/19 1345  WBC 9.1  NEUTROABS 5.8  HGB 9.9*  HCT 32.7*  MCV 91.6  PLT [id number]   Basic Metabolic Panel: Recent Labs  Lab 10/25/19 1345  NA 139  K 3.3*  CL 98  CO2 27  GLUCOSE 124*  BUN 16  CREATININE 0.80    CALCIUM 10.0   GFR: Estimated Creatinine Clearance: 74.6 mL/min (by C-G formula based on SCr of 0.8 mg/dL). Liver Function Tests: Recent Labs  Lab 10/25/19 1345  AST 47*  ALT 74*  ALKPHOS 364*  BILITOT 0.3  PROT 8.0  ALBUMIN 3.7   No results for input(s): LIPASE, AMYLASE in the last 168 hours. No results for input(s): AMMONIA in the last 168 hours. Coagulation Profile: No results for input(s): INR, PROTIME in the last 168 hours. Cardiac Enzymes: No results for input(s): CKTOTAL, CKMB, CKMBINDEX, TROPONINI in the last 168 hours. BNP (last 3 results) No results for input(s): PROBNP in the last 8760 hours. HbA1C: No results for input(s): HGBA1C in the last 72 hours. CBG: No results for input(s): GLUCAP in the last 168 hours. Lipid Profile: No results for input(s): CHOL, HDL, LDLCALC, TRIG, CHOLHDL,  LDLDIRECT in the last 72 hours. Thyroid Function Tests: No results for input(s): TSH, T4TOTAL, FREET4, T3FREE, THYROIDAB in the last 72 hours. Anemia Panel: No results for input(s): VITAMINB12, FOLATE, FERRITIN, TIBC, IRON, RETICCTPCT in the last 72 hours. Urine analysis:    Component Value Date/Time   COLORURINE LT. YELLOW 08/04/2011 0805   APPEARANCEUR Cloudy 08/04/2011 0805   LABSPEC 1.020 08/04/2011 0805   PHURINE 6.0 08/04/2011 0805   GLUCOSEU NEGATIVE 08/04/2011 0805   HGBUR LARGE 08/04/2011 0805   BILIRUBINUR NEGATIVE 08/04/2011 0805   KETONESUR NEGATIVE 08/04/2011 0805   UROBILINOGEN 0.2 08/04/2011 0805   NITRITE NEGATIVE 08/04/2011 0805   LEUKOCYTESUR NEGATIVE 08/04/2011 0805   Sepsis Labs: @LABRCNTIP (procalcitonin:4,lacticidven:4) )No results found for this or any previous visit (from the past 240 hour(s)).   Radiological Exams on Admission: DG Chest 2 View  Result Date: 10/25/2019 CLINICAL DATA:  Left upper extremity region pain and edema EXAM: CHEST - 2 VIEW COMPARISON:  None. FINDINGS: There is atelectatic change in the left base with small left pleural  effusion. There is subtle increased opacity in the right upper lobe which may represent earliest changes of pneumonia. Lungs elsewhere clear. Heart size and pulmonary vascularity are normal. No adenopathy. No bone lesions. No pneumothorax. There is soft tissue swelling in the visualized left upper extremity. IMPRESSION: 1.  Small left pleural effusion with left base atelectasis. 2. Suspect earliest changes of pneumonia right upper lobe. Question atypical organism pneumonia. 3.  Cardiac silhouette normal. 4.  No adenopathy. 5.  Soft tissue swelling of visualized left upper extremity. Electronically Signed   By: Lowella Grip III M.D.   On: 10/25/2019 14:17   CT Angio Chest PE W/Cm &/Or Wo Cm  Result Date: 10/25/2019 CLINICAL DATA:  Swelling and pain in left upper extremity with blood clot. Left upper back pain. EXAM: CT ANGIOGRAPHY CHEST WITH CONTRAST TECHNIQUE: Multidetector CT imaging of the chest was performed using the standard protocol during bolus administration of intravenous contrast. Multiplanar CT image reconstructions and MIPs were obtained to evaluate the vascular anatomy. CONTRAST:  76mL OMNIPAQUE IOHEXOL 350 MG/ML SOLN COMPARISON:  None. FINDINGS: Cardiovascular: No filling defects in the pulmonary arteries to suggest pulmonary emboli. The given history of a left axillary vein thrombus cannot be visualized on this study. Heart is normal size. Aorta is normal caliber. Mediastinum/Nodes: There is abnormal soft tissue in the left axilla measuring 4.1 x 2.3 cm on image 30. This is presumably conglomerate nodal mass. Borderline sized mediastinal lymph nodes. Prevascular lymph node measures 11 mm. Enlarged right paratracheal lymph node measures 15 mm. Lungs/Pleura: Moderate-sized left pleural effusion. Atelectasis in the left lower lobe. Ground-glass airspace opacity in the posterior right upper lobe could reflect early infiltrate. Scattered small nodules within the lungs. 3 mm nodule in the right  upper lobe on image 41. 3 mm nodule in the left upper lobe on image 47. Clustered nodules seen posteriorly in the superior segment of the right lower lobe. Upper Abdomen: Imaging into the upper abdomen shows no acute findings. Musculoskeletal: Rounded masslike density seen in the left breast measuring 4.2 x 2.9 cm. Recommend correlation with mammography. Mixed lytic and sclerotic lesions diffusely throughout the visualized spine as well as turn Um and manubrium, left scapula, bilateral humeral heads and multiple bilateral ribs compatible with osseous metastatic disease. Review of the MIP images confirms the above findings. IMPRESSION: No evidence of pulmonary embolus. Rounded soft tissue mass in the left breast. Cannot exclude breast cancer given the below findings.  Recommend correlation with mammography. Numerous left axillary lymph nodes with conglomerate nodal mass. There is abnormal appearance of the left latissimus Dorsey and muscles surrounding the left scapula concerning for mass/tumor involvement. Mild mediastinal adenopathy. Diffuse sclerotic and lytic lesions throughout the visualized skeleton compatible with osseous metastatic disease. Ground-glass airspace opacity in the superior segment of the right lower lobe with clustered nodular densities. Similar airspace disease posteriorly in the right upper lobe. Cannot exclude areas of early pneumonia. Moderate left pleural effusion. Compressive atelectasis in the left lower lobe. These results were called by telephone at the time of interpretation on 10/25/2019 at 7:19 pm to provider MARGAUX VENTER , who verbally acknowledged these results. Electronically Signed   By: Rolm Baptise M.D.   On: 10/25/2019 19:19   UE VENOUS DUPLEX ([facility] & [facility] 7 am - 7 pm)  Result Date: 10/25/2019 UPPER VENOUS STUDY  Indications: Swelling Risk Factors: DVT. Anticoagulation: Eliquis. Limitations: Body habitus, poor ultrasound/tissue interface and patient pain tolerance. Comparison  Study: 10/17/2019-age indeterminate DVT left upper extremity Performing Technologist: Oliver Hum RVT  Examination Guidelines: A complete evaluation includes B-mode imaging, spectral Doppler, color Doppler, and power Doppler as needed of all accessible portions of each vessel. Bilateral testing is considered an integral part of a complete examination. Limited examinations for reoccurring indications may be performed as noted.  Right Findings: +----------+------------+---------+-----------+----------+-------+ RIGHT     CompressiblePhasicitySpontaneousPropertiesSummary +----------+------------+---------+-----------+----------+-------+ Subclavian    Full       Yes       Yes                      +----------+------------+---------+-----------+----------+-------+  Left Findings: +----------+------------+---------+-----------+----------+--------------+ LEFT      CompressiblePhasicitySpontaneousProperties   Summary     +----------+------------+---------+-----------+----------+--------------+ IJV           Full       Yes       Yes                             +----------+------------+---------+-----------+----------+--------------+ Subclavian    None       No        No                   Acute      +----------+------------+---------+-----------+----------+--------------+ Axillary      None       No        No                   Acute      +----------+------------+---------+-----------+----------+--------------+ Brachial      None                 No                   Acute      +----------+------------+---------+-----------+----------+--------------+ Radial        None                                      Acute      +----------+------------+---------+-----------+----------+--------------+ Ulnar         None                                      Acute      +----------+------------+---------+-----------+----------+--------------+  Cephalic      Full                                                  +----------+------------+---------+-----------+----------+--------------+ Basilic                                             Not visualized +----------+------------+---------+-----------+----------+--------------+  Summary:  Right: No evidence of thrombosis in the subclavian.  Left: No evidence of superficial vein thrombosis in the upper extremity. Findings consistent with acute deep vein thrombosis involving the left subclavian vein, left axillary vein, left brachial veins, left radial veins and left ulnar veins. Unable to visualize basilic vein.  *See table(s) above for measurements and observations.    Preliminary      Assessment/Plan Principal Problem:   Acute deep vein thrombosis (DVT) of axillary vein of left upper extremity (HCC) Active Problems:   Community acquired pneumonia   Radial nerve palsy   Breast mass, left   DVT (deep vein thrombosis) in pregnancy   Hypokalemia     #1 left upper extremity DVT: Extensive DVT involving multiple veins.  We will switch patient to heparin drip as we get further evaluation with oncology.  Elevate the arm and supportive care.  Pain control as tolerated.  #2 radial nerve palsy: Suspected secondary to extensive DVT with vascular compromise.  No evidence of compartment syndrome.  Continue to elevate.  Physical therapy once cleared.  #3 left breast mass with possible metastasis: Most likely patient has breast cancer with metastasis to the bone and nearby tissues.  This may be the risk factors for her DVT.  She will need tissue diagnosis and evaluation.  Oncology already consulted.  #4 hypokalemia: Replete   #5 possible community-acquired pneumonia: We will treat empirically with antibiotics.   DVT prophylaxis: Lovenox Code Status: Full code Family Communication: Discussed care with patient directly no family around Disposition Plan: To be determined Consults called: Dr. Lindi Adie, oncology Admission  status: Inpatient  Severity of Illness: The appropriate patient status for this patient is INPATIENT. Inpatient status is judged to be reasonable and necessary in order to provide the required intensity of service to ensure the patient's safety. The patient's presenting symptoms, physical exam findings, and initial radiographic and laboratory data in the context of their chronic comorbidities is felt to place them at high risk for further clinical deterioration. Furthermore, it is not anticipated that the patient will be medically stable for discharge from the hospital within 2 midnights of admission. The following factors support the patient status of inpatient.   " The patient's presenting symptoms include swelling of the left upper extremity. " The worrisome physical exam findings include swelling of the left upper extremity. " The initial radiographic and laboratory data are worrisome because of CT findings as above. " The chronic co-morbidities include no significant past medical history.   * I certify that at the point of admission it is my clinical judgment that the patient will require inpatient hospital care spanning beyond 2 midnights from the point of admission due to high intensity of service, high risk for further deterioration and high frequency of surveillance required.Barbette Merino MD [facility] Pager [phone]  If 7PM-7AM, please contact  night-coverage [email or web address] Password [facility]  10/25/2019, 8:26 PM

## 2019-10-25 NOTE — Telephone Encounter (Signed)
Received a new patient referral from Dr. Francee Gentile for DVT. Ms Abigail [name] has been cld and scheduled to see Dr. Irene Limbo on 1/19 at 1pm. Pt aware to arrive 15 minutes early.

## 2019-10-25 NOTE — ED Triage Notes (Addendum)
Pt reports was seen by her sports medicine doctor today and sent to Ed for continuation of swelling, pains and less function of left arm/fingers.

## 2019-10-25 NOTE — ED Notes (Signed)
Ultrasound tech at pt bedside  

## 2019-10-25 NOTE — ED Notes (Signed)
Provider at bedside

## 2019-10-25 NOTE — Progress Notes (Signed)
Left upper extremity venous duplex has been completed. Preliminary results can be found in CV Proc through chart review.  Results were given to Dr. Vanita Panda.  10/25/19 6:11 PM Abigail [name] RVT

## 2019-10-26 ENCOUNTER — Encounter (HOSPITAL_COMMUNITY): Payer: Self-pay | Admitting: Internal Medicine

## 2019-10-26 ENCOUNTER — Inpatient Hospital Stay (HOSPITAL_COMMUNITY): Payer: Managed Care, Other (non HMO)

## 2019-10-26 DIAGNOSIS — I82A12 Acute embolism and thrombosis of left axillary vein: Secondary | ICD-10-CM

## 2019-10-26 LAB — CBC
HCT: 28.2 % — ABNORMAL LOW (ref 36.0–46.0)
HCT: 28.8 % — ABNORMAL LOW (ref 36.0–46.0)
Hemoglobin: 8.4 g/dL — ABNORMAL LOW (ref 12.0–15.0)
Hemoglobin: 8.4 g/dL — ABNORMAL LOW (ref 12.0–15.0)
MCH: 26.7 pg (ref 26.0–34.0)
MCH: 27.2 pg (ref 26.0–34.0)
MCHC: 29.2 g/dL — ABNORMAL LOW (ref 30.0–36.0)
MCHC: 29.8 g/dL — ABNORMAL LOW (ref 30.0–36.0)
MCV: 91.3 fL (ref 80.0–100.0)
MCV: 91.4 fL (ref 80.0–100.0)
Platelets: 287 10*3/uL (ref 150–400)
Platelets: 289 10*3/uL (ref 150–400)
RBC: 3.09 MIL/uL — ABNORMAL LOW (ref 3.87–5.11)
RBC: 3.15 MIL/uL — ABNORMAL LOW (ref 3.87–5.11)
RDW: 14.6 % (ref 11.5–15.5)
RDW: 14.6 % (ref 11.5–15.5)
WBC: 8.3 10*3/uL (ref 4.0–10.5)
WBC: 8.4 10*3/uL (ref 4.0–10.5)
nRBC: 1.8 % — ABNORMAL HIGH (ref 0.0–0.2)
nRBC: 2.3 % — ABNORMAL HIGH (ref 0.0–0.2)

## 2019-10-26 LAB — COMPREHENSIVE METABOLIC PANEL
ALT: 61 U/L — ABNORMAL HIGH (ref 0–44)
AST: 41 U/L (ref 15–41)
Albumin: 3.3 g/dL — ABNORMAL LOW (ref 3.5–5.0)
Alkaline Phosphatase: 307 U/L — ABNORMAL HIGH (ref 38–126)
Anion gap: 12 (ref 5–15)
BUN: 15 mg/dL (ref 6–20)
CO2: 27 mmol/L (ref 22–32)
Calcium: 9.3 mg/dL (ref 8.9–10.3)
Chloride: 100 mmol/L (ref 98–111)
Creatinine, Ser: 0.72 mg/dL (ref 0.44–1.00)
GFR calc Af Amer: >60 mL/min (ref >60)
GFR calc non Af Amer: >60 mL/min (ref >60)
Glucose, Bld: 103 mg/dL — ABNORMAL HIGH (ref 70–99)
Potassium: 3.6 mmol/L (ref 3.5–5.1)
Sodium: 139 mmol/L (ref 135–145)
Total Bilirubin: 0.5 mg/dL (ref 0.3–1.2)
Total Protein: 7 g/dL (ref 6.5–8.1)

## 2019-10-26 LAB — CREATININE, SERUM
Creatinine, Ser: 0.73 mg/dL (ref 0.44–1.00)
GFR calc Af Amer: >60 mL/min (ref >60)
GFR calc non Af Amer: >60 mL/min (ref >60)

## 2019-10-26 LAB — APTT
aPTT: 174 seconds (ref 24–36)
aPTT: 70 seconds — ABNORMAL HIGH (ref 24–36)
aPTT: 68 seconds — ABNORMAL HIGH (ref 24–36)

## 2019-10-26 LAB — HIV ANTIBODY (ROUTINE TESTING W REFLEX): HIV Screen 4th Generation wRfx: NONREACTIVE

## 2019-10-26 LAB — HEPARIN LEVEL (UNFRACTIONATED)
Heparin Unfractionated: >2.2 IU/mL — ABNORMAL HIGH (ref 0.30–0.70)
Heparin Unfractionated: >2.2 IU/mL — ABNORMAL HIGH (ref 0.30–0.70)

## 2019-10-26 LAB — SARS CORONAVIRUS 2 (TAT 6-24 HRS): SARS Coronavirus 2: NEGATIVE

## 2019-10-26 IMAGING — CT CT ABD-PELV W/ CM
2 of 5 series · 15 of 46 positions shown, 17 images · IV contrast (ISOVUE 300)
Comparison: Chest CT from yesterday.

CLINICAL DATA: Followup chest CT demonstrating metastatic disease
and left breast mass

EXAM:
CT ABDOMEN AND PELVIS WITH CONTRAST
TECHNIQUE: Multidetector CT imaging of the abdomen and pelvis was performed
using the standard protocol following bolus administration of
intravenous contrast.
CONTRAST:  100mL OMNIPAQUE IOHEXOL 300 MG/ML  SOLN

[Series 2: axial st · axial · 0.79mm/px · z∈[-660,-264]mm · 12 of 93 slices shown, 14 images]
[im 7/93  soft-tissue]
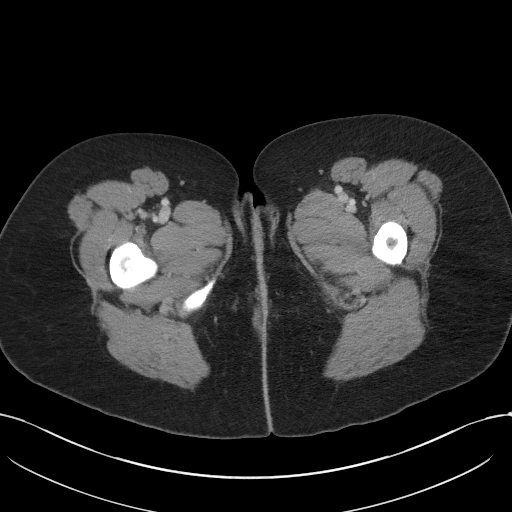
[im 7/93  bone]
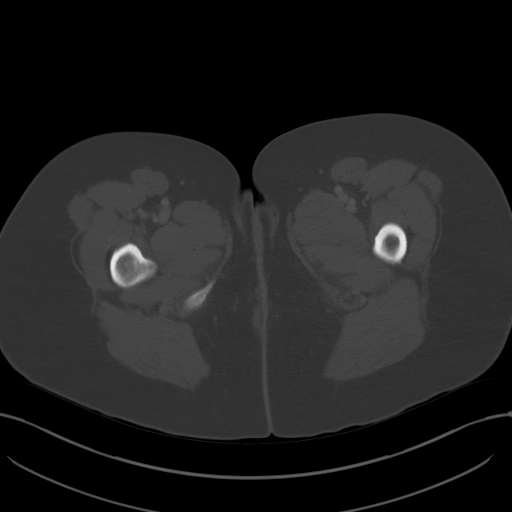
[im 14/93  soft-tissue]
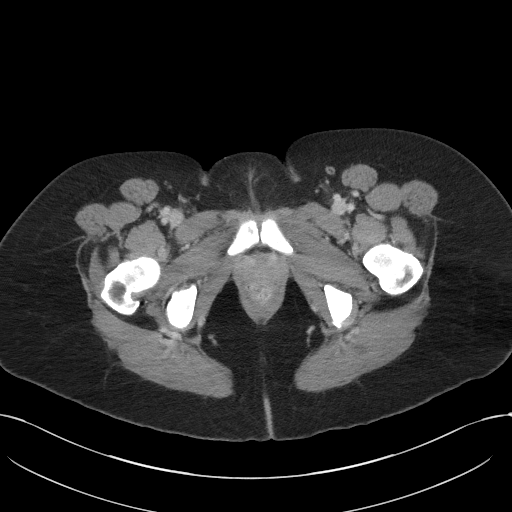
[im 20/93  soft-tissue]
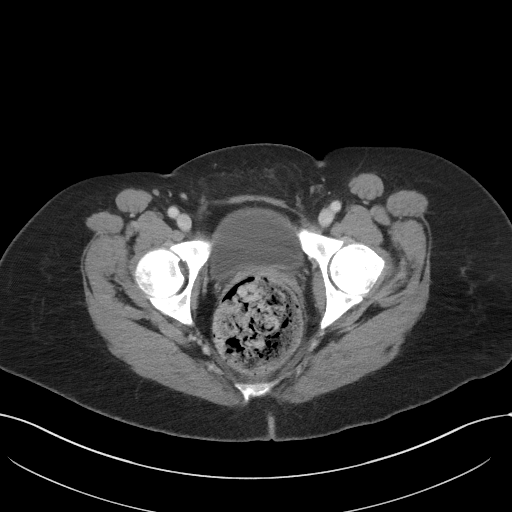
[im 27/93  soft-tissue]
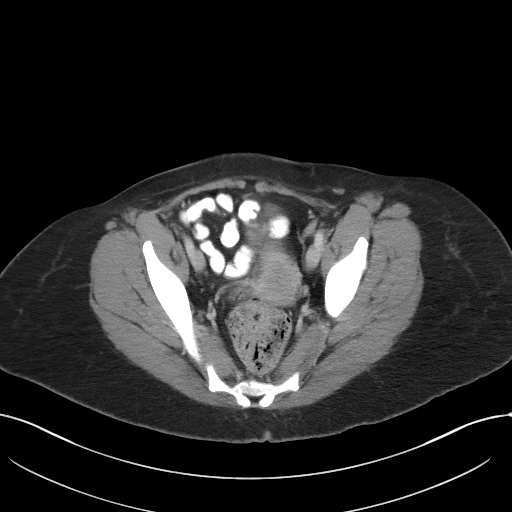
[im 33/93  soft-tissue]
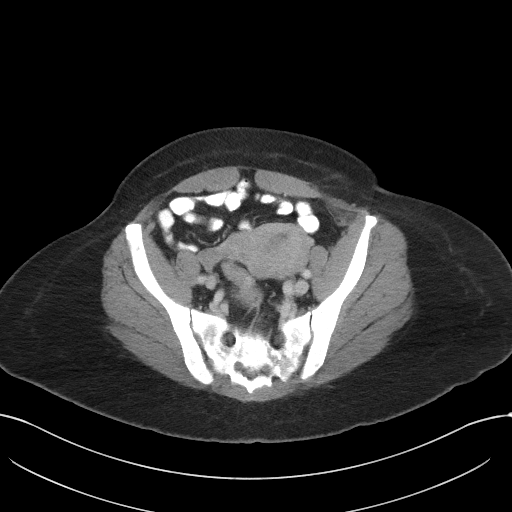
[im 40/93  soft-tissue]
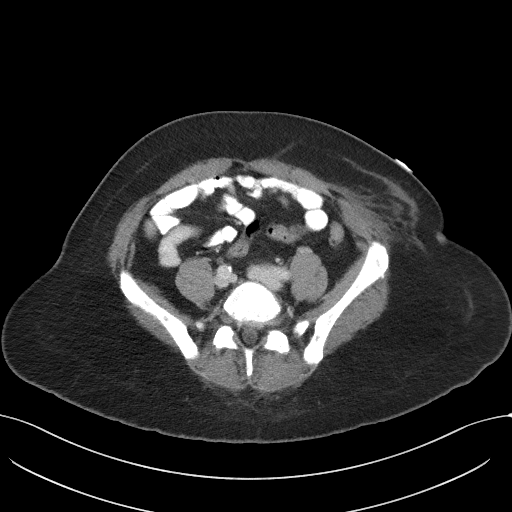
[im 53/93  soft-tissue]
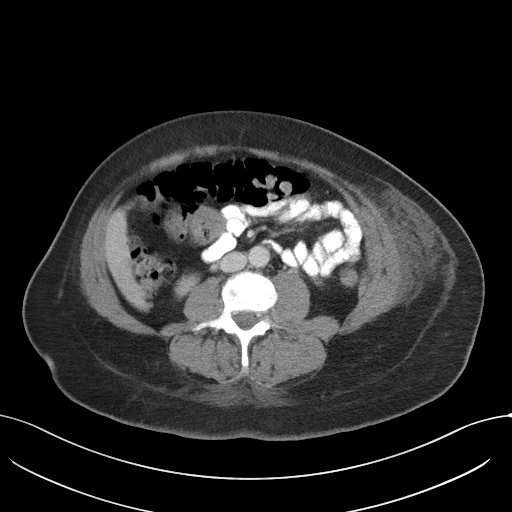
[im 60/93  soft-tissue]
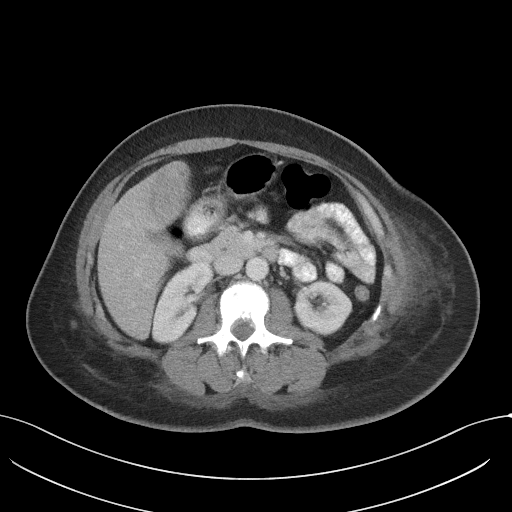
[im 66/93  soft-tissue]
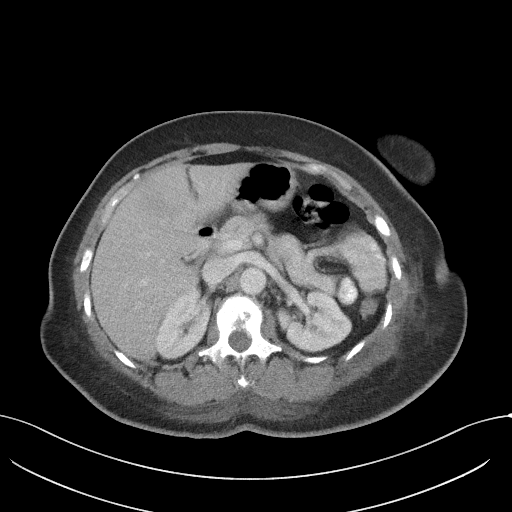
[im 66/93  bone]
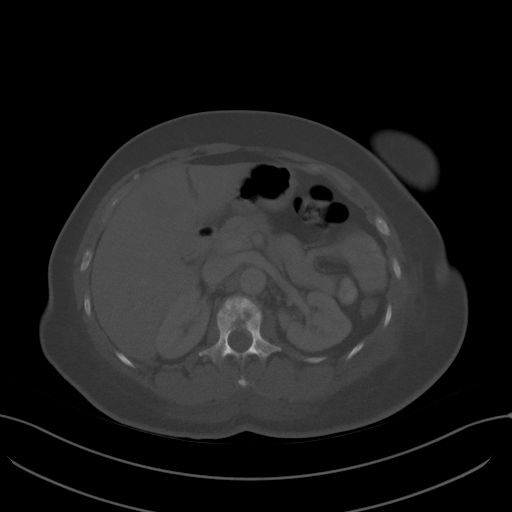
[im 73/93  soft-tissue]
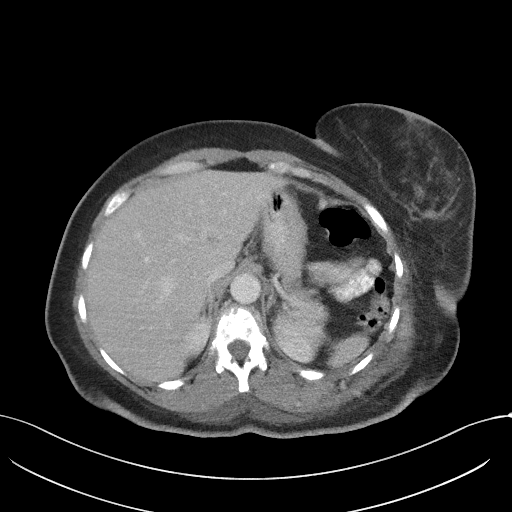
[im 79/93  soft-tissue]
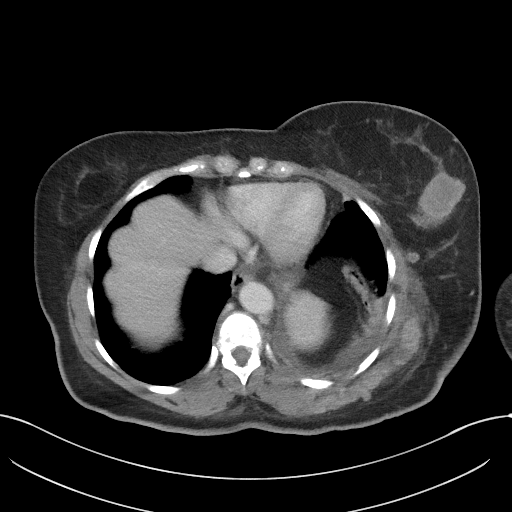
[im 86/93  soft-tissue]
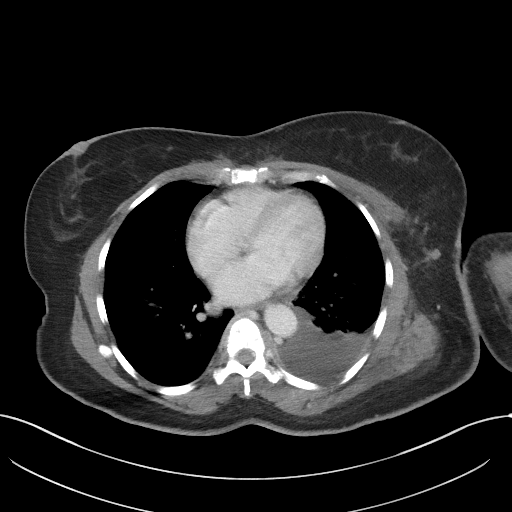

[Series 5: coronal st · coronal · 0.68mm/px · 3 of 101 slices shown]
[im 34/101  soft-tissue]
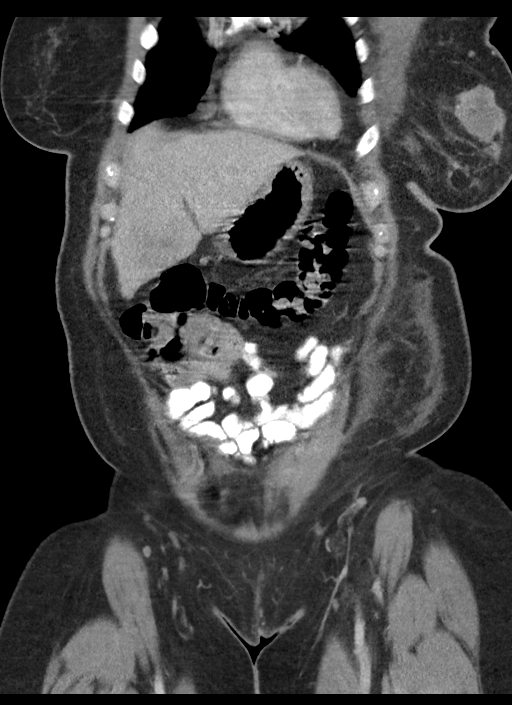
[im 45/101  soft-tissue]
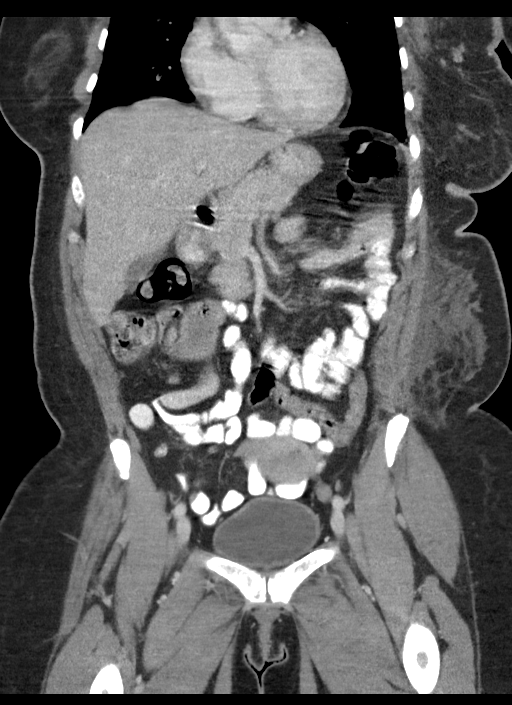
[im 56/101  soft-tissue]
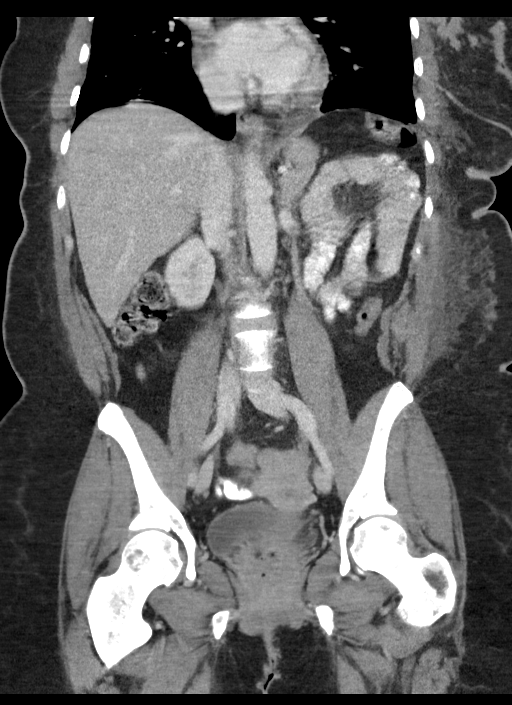

[15 of 46 positions shown; findings below may reference images not displayed]

FINDINGS: Lower chest: 4 cm enhancing left breast mass with extensive
lymphadenopathy involving the left axilla and tumor involving the
left chest wall including the latissimus dorsi and serratus anterior
muscles.

Left pleural effusion with overlying atelectasis and pulmonary
nodules suspicious for pulmonary metastatic disease.

Hepatobiliary: Large ill-defined lesion in segment 4 B of the liver
worrisome for a metastatic focus. I do not see any other definite
liver lesions.

The gallbladder is unremarkable. No common bile duct dilatation. The
portal and hepatic veins are patent.

Pancreas: No mass, inflammation or ductal dilatation.

Spleen: Normal size.  No focal lesions.

Adrenals/Urinary Tract: The adrenal glands and kidneys are
unremarkable. The bladder is normal.

Stomach/Bowel: The stomach, duodenum, small bowel and colon are
unremarkable. No acute inflammatory changes, mass lesions or
obstructive findings. The terminal ileum is normal. The appendix is
normal. Moderate stool noted in the mildly distended rectum.

Vascular/Lymphatic: The aorta and branch vessels are patent. The
major venous structures are patent.

No mesenteric or retroperitoneal mass or adenopathy. No pelvic
adenopathy.

Reproductive: Mildly enlarged fibroid uterus noted. The ovaries
appear normal.

Other: No free pelvic fluid collections. No inguinal mass or
adenopathy.

Musculoskeletal: Diffuse osseous metastatic disease with mixed lytic
and sclerotic lesions throughout the spine, ribs and pelvis and both
hips. No pathologic fracture and no spinal canal compromise.
IMPRESSION: 1. Ill-defined 3.5 cm lesion in segment 4B of the liver, likely a
metastatic focus.
2. No other abdominal/pelvic metastatic disease identified.
3. Diffuse osseous metastatic disease with mixed lytic and sclerotic
process. No pathologic fracture or spinal canal compromise.
4. Left pleural effusion and small metastatic pulmonary nodules.
5. Extensive left breast and chest wall disease as better seen on
recent chest CT.

## 2019-10-26 MED ORDER — IOHEXOL 300 MG/ML  SOLN
100.0000 mL | Freq: Once | INTRAMUSCULAR | Status: AC | PRN
  Administered 2019-10-26: 16:00:00 100 mL via INTRAVENOUS

## 2019-10-26 MED ORDER — HYDROCODONE-ACETAMINOPHEN 5-325 MG PO TABS
1.0000 | ORAL_TABLET | Freq: Four times a day (QID) | ORAL | Status: DC | PRN
  Administered 2019-10-26 – 2019-10-30 (×8): 1 via ORAL
  Filled 2019-10-26 (×9): qty 1

## 2019-10-26 MED ORDER — IOHEXOL 9 MG/ML PO SOLN
500.0000 mL | ORAL | Status: AC
  Administered 2019-10-26: 500 mL via ORAL

## 2019-10-26 MED ORDER — SODIUM CHLORIDE (PF) 0.9 % IJ SOLN
INTRAMUSCULAR | Status: AC
  Filled 2019-10-26: qty 50

## 2019-10-26 MED ORDER — IOHEXOL 9 MG/ML PO SOLN
ORAL | Status: AC
  Administered 2019-10-26: 14:00:00 500 mL via ORAL
  Filled 2019-10-26: qty 1000

## 2019-10-26 MED ORDER — METHOCARBAMOL 500 MG PO TABS
500.0000 mg | ORAL_TABLET | Freq: Three times a day (TID) | ORAL | Status: DC | PRN
  Administered 2019-10-26: 13:00:00 500 mg via ORAL
  Filled 2019-10-26: qty 1

## 2019-10-26 NOTE — Progress Notes (Signed)
PROGRESS NOTE    Abigail [name]  [id number] DOB: 11/22/1965 DOA: 10/25/2019 PCP: Hoyt Koch, MD    Brief Narrative:  Patient is a 54 year old female with history of hyperlipidemia, no much medical issues who suffered some minor injury on her left shoulder 3 months ago and had been having left arm pain since then.  Patient continued to have pain that prompted an upper extremity duplex that showed DVT on 1/6.  Patient was initiated on Eliquis.  Patient continued to have worsening pain on her hands, she was unable to lift up her wrist so came to emergency room last night.  In the emergency room, she had obvious swelling of the left upper extremity, hemodynamically stable.  95% on room air.  Hemoglobin 9.9.  Covid negative.  CT angiogram of the chest showed no evidence of PE, groundglass opacities right upper lobe, conglomerate of lymphadenopathy on the left axilla, mediastinal lymphadenopathy sclerotic and lytic lesions throughout the skeleton as well as soft tissue mass on the left breast. Repeat duplex consistent with acute DVT left subclavian, left axillary, left brachial and left radial and left ulnar veins.  No evidence of compartment syndrome, distal arterial supply intact.    Assessment & Plan:   Principal Problem:   Acute deep vein thrombosis (DVT) of axillary vein of left upper extremity (HCC) Active Problems:   Community acquired pneumonia   Radial nerve palsy   Breast mass, left   DVT (deep vein thrombosis) in pregnancy   Hypokalemia  Left upper extremity DVT of multiple veins: Was on Eliquis.  Developed new blood clots on Eliquis, currently started on heparin.  Will continue on heparin until stabilization.  Adequate pain medications.  Splinting left wrist.  Appreciate oncology input.  Metastatic breast cancer: Clinical and radiological evidence consistent with metastatic breast cancer.  Probably with brachial plexus compression.  Oncology to see patient.  Probably will  need biopsy and treatment.  Hypokalemia: Replaced.  Community-acquired pneumonia: Suspected.  Cultures negative so far.  More likely metastatic lesion.  We will continue antibiotics today.  Discussed and explained to the patient that this is probably some cancer.  She never had any screening mammogram.  She never had any screening colonoscopy.  DVT prophylaxis: Heparin infusion Code Status: Full code Family Communication: None Disposition Plan: Pending clinical improvement.   Consultants:   Oncology  Procedures:   None  Antimicrobials:  Antibiotics Given (last 72 hours)    Date/Time Action Medication Dose Rate   10/25/19 2003 New Bag/Given   cefTRIAXone (ROCEPHIN) 1 g in sodium chloride 0.9 % 100 mL IVPB 1 g 200 mL/hr   10/25/19 2049 New Bag/Given   azithromycin (ZITHROMAX) 500 mg in sodium chloride 0.9 % 250 mL IVPB 500 mg 250 mL/hr         Subjective: Patient was seen and examined in the morning rounds.  Patient stated ongoing pain and swelling, however pain slightly subsided with pain medications.  Still unable to lift up her left wrist.  Objective: Vitals:   10/26/19 0800 10/26/19 0858 10/26/19 1159 10/26/19 1349  BP: 113/80 119/77 111/79 119/79  Pulse: (!) 102 (!) 113 (!) 107 (!) 112  Resp: (!) 25 17 17 18   Temp:  98.5 F (36.9 C) 98.6 F (37 C) 98.7 F (37.1 C)  TempSrc:  Oral Oral Oral  SpO2: 97% 99% 96% 97%  Weight:  72.8 kg    Height:  5\' 1"  (1.549 m)      Intake/Output Summary (Last 24 hours)  at 10/26/2019 1610 Last data filed at 10/26/2019 0930 Gross per 24 hour  Intake 733 ml  Output --  Net 733 ml   Filed Weights   10/26/19 0858  Weight: 72.8 kg    Examination:  General exam: Appears calm and comfortable, on room air.  Not in any distress. Respiratory system: Clear to auscultation. Respiratory effort normal.  No added sounds. Cardiovascular system: S1 & S2 heard, RRR. No JVD, murmurs, rubs, gallops or clicks.  Gastrointestinal system:  Abdomen is nondistended, soft and nontender. No organomegaly or masses felt. Normal bowel sounds heard. Central nervous system: Alert and oriented. No focal neurological deficits. Extremities: Symmetric 5 x 5 power. Skin: No rashes, lesions or ulcers Psychiatry: Judgement and insight appear normal. Mood & affect appropriate.   Patient was examined with chaperone, bedside nurse. Patient has palpable firm mass about 6 cm, outer lateral quadrant of the left breast. Palpable multiple lymphadenopathy left axilla. 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 Left wrist extension 3+/5.   Data Reviewed: I have personally reviewed following labs and imaging studies  CBC: Recent Labs  Lab 10/25/19 1345 10/25/19 2340 10/26/19 0345  WBC 9.1 8.3 8.4  NEUTROABS 5.8  --   --   HGB 9.9* 8.4* 8.4*  HCT 32.7* 28.8* 28.2*  MCV 91.6 91.4 91.3  PLT 294 287 [id number]   Basic Metabolic Panel: Recent Labs  Lab 10/25/19 1345 10/25/19 2340 10/26/19 0345  NA 139  --  139  K 3.3*  --  3.6  CL 98  --  100  CO2 27  --  27  GLUCOSE 124*  --  103*  BUN 16  --  15  CREATININE 0.80 0.73 0.72  CALCIUM 10.0  --  9.3   GFR: Estimated Creatinine Clearance: 74.2 mL/min (by C-G formula based on SCr of 0.72 mg/dL). Liver Function Tests: Recent Labs  Lab 10/25/19 1345 10/26/19 0345  AST 47* 41  ALT 74* 61*  ALKPHOS 364* 307*  BILITOT 0.3 0.5  PROT 8.0 7.0  ALBUMIN 3.7 3.3*   No results for input(s): LIPASE, AMYLASE in the last 168 hours. No results for input(s): AMMONIA in the last 168 hours. Coagulation Profile: No results for input(s): INR, PROTIME in the last 168 hours. Cardiac Enzymes: No results for input(s): CKTOTAL, CKMB, CKMBINDEX, TROPONINI in the last 168 hours. BNP (last 3 results) No results for input(s): PROBNP in the last 8760 hours. HbA1C: No results for input(s): HGBA1C in the last 72 hours. CBG: No results for input(s): GLUCAP in the last 168 hours. Lipid Profile: No results for  input(s): CHOL, HDL, LDLCALC, TRIG, CHOLHDL, LDLDIRECT in the last 72 hours. Thyroid Function Tests: No results for input(s): TSH, T4TOTAL, FREET4, T3FREE, THYROIDAB in the last 72 hours. Anemia Panel: No results for input(s): VITAMINB12, FOLATE, FERRITIN, TIBC, IRON, RETICCTPCT in the last 72 hours. Sepsis Labs: No results for input(s): PROCALCITON, LATICACIDVEN in the last 168 hours.  Recent Results (from the past 240 hour(s))  SARS CORONAVIRUS 2 (TAT 6-24 HRS) Nasopharyngeal Nasopharyngeal Swab     Status: None   Collection Time: 10/25/19  8:48 PM   Specimen: Nasopharyngeal Swab  Result Value Ref Range Status   SARS Coronavirus 2 NEGATIVE NEGATIVE Final    Comment: (NOTE) SARS-CoV-2 target nucleic acids are NOT DETECTED. The SARS-CoV-2 RNA is generally detectable in upper and lower respiratory specimens during the acute phase of infection. Negative results do not preclude SARS-CoV-2 infection, do not rule out co-infections with other pathogens,  and should not be used as the sole basis for treatment or other patient management decisions. Negative results must be combined with clinical observations, patient history, and epidemiological information. The expected result is Negative. Fact Sheet for Patients: SugarRoll.be Fact Sheet for Healthcare Providers: https://www.woods-mathews.com/ This test is not yet approved or cleared by the [place] FDA and  has been authorized for detection and/or diagnosis of SARS-CoV-2 by FDA under an Emergency Use Authorization (EUA). This EUA will remain  in effect (meaning this test can be used) for the duration of the COVID-19 declaration under Section 56 4(b)(1) of the Act, 21 U.S.C. section 360bbb-3(b)(1), unless the authorization is terminated or revoked sooner. Performed at [facility], [place] [address], [place], [place] [address]          Radiology Studies: DG Chest 2 View  Result  Date: 10/25/2019 CLINICAL DATA:  Left upper extremity region pain and edema EXAM: CHEST - 2 VIEW COMPARISON:  None. FINDINGS: There is atelectatic change in the left base with small left pleural effusion. There is subtle increased opacity in the right upper lobe which may represent earliest changes of pneumonia. Lungs elsewhere clear. Heart size and pulmonary vascularity are normal. No adenopathy. No bone lesions. No pneumothorax. There is soft tissue swelling in the visualized left upper extremity. IMPRESSION: 1.  Small left pleural effusion with left base atelectasis. 2. Suspect earliest changes of pneumonia right upper lobe. Question atypical organism pneumonia. 3.  Cardiac silhouette normal. 4.  No adenopathy. 5.  Soft tissue swelling of visualized left upper extremity. Electronically Signed   By: Lowella Grip III M.D.   On: 10/25/2019 14:17   CT Angio Chest PE W/Cm &/Or Wo Cm  Result Date: 10/25/2019 CLINICAL DATA:  Swelling and pain in left upper extremity with blood clot. Left upper back pain. EXAM: CT ANGIOGRAPHY CHEST WITH CONTRAST TECHNIQUE: Multidetector CT imaging of the chest was performed using the standard protocol during bolus administration of intravenous contrast. Multiplanar CT image reconstructions and MIPs were obtained to evaluate the vascular anatomy. CONTRAST:  8mL OMNIPAQUE IOHEXOL 350 MG/ML SOLN COMPARISON:  None. FINDINGS: Cardiovascular: No filling defects in the pulmonary arteries to suggest pulmonary emboli. The given history of a left axillary vein thrombus cannot be visualized on this study. Heart is normal size. Aorta is normal caliber. Mediastinum/Nodes: There is abnormal soft tissue in the left axilla measuring 4.1 x 2.3 cm on image 30. This is presumably conglomerate nodal mass. Borderline sized mediastinal lymph nodes. Prevascular lymph node measures 11 mm. Enlarged right paratracheal lymph node measures 15 mm. Lungs/Pleura: Moderate-sized left pleural effusion.  Atelectasis in the left lower lobe. Ground-glass airspace opacity in the posterior right upper lobe could reflect early infiltrate. Scattered small nodules within the lungs. 3 mm nodule in the right upper lobe on image 41. 3 mm nodule in the left upper lobe on image 47. Clustered nodules seen posteriorly in the superior segment of the right lower lobe. Upper Abdomen: Imaging into the upper abdomen shows no acute findings. Musculoskeletal: Rounded masslike density seen in the left breast measuring 4.2 x 2.9 cm. Recommend correlation with mammography. Mixed lytic and sclerotic lesions diffusely throughout the visualized spine as well as turn Um and manubrium, left scapula, bilateral humeral heads and multiple bilateral ribs compatible with osseous metastatic disease. Review of the MIP images confirms the above findings. IMPRESSION: No evidence of pulmonary embolus. Rounded soft tissue mass in the left breast. Cannot exclude breast cancer given the below findings.  Recommend correlation with mammography. Numerous left axillary lymph nodes with conglomerate nodal mass. There is abnormal appearance of the left latissimus Dorsey and muscles surrounding the left scapula concerning for mass/tumor involvement. Mild mediastinal adenopathy. Diffuse sclerotic and lytic lesions throughout the visualized skeleton compatible with osseous metastatic disease. Ground-glass airspace opacity in the superior segment of the right lower lobe with clustered nodular densities. Similar airspace disease posteriorly in the right upper lobe. Cannot exclude areas of early pneumonia. Moderate left pleural effusion. Compressive atelectasis in the left lower lobe. These results were called by telephone at the time of interpretation on 10/25/2019 at 7:19 pm to provider MARGAUX VENTER , who verbally acknowledged these results. Electronically Signed   By: Rolm Baptise M.D.   On: 10/25/2019 19:19   UE VENOUS DUPLEX ([facility] & [facility] 7 am - 7 pm)  Result Date:  10/26/2019 UPPER VENOUS STUDY  Indications: Swelling Anticoagulation: Eliquis. Limitations: Body habitus, poor ultrasound/tissue interface and patient pain tolerance. Comparison Study: 10/17/2019-age indeterminate DVT left upper extremity Performing Technologist: Oliver Hum RVT  Examination Guidelines: A complete evaluation includes B-mode imaging, spectral Doppler, color Doppler, and power Doppler as needed of all accessible portions of each vessel. Bilateral testing is considered an integral part of a complete examination. Limited examinations for reoccurring indications may be performed as noted.  Right Findings: +----------+------------+---------+-----------+----------+-------+ RIGHT     CompressiblePhasicitySpontaneousPropertiesSummary +----------+------------+---------+-----------+----------+-------+ Subclavian    Full       Yes       Yes                      +----------+------------+---------+-----------+----------+-------+  Left Findings: +----------+------------+---------+-----------+----------+--------------+ LEFT      CompressiblePhasicitySpontaneousProperties   Summary     +----------+------------+---------+-----------+----------+--------------+ IJV           Full       Yes       Yes                             +----------+------------+---------+-----------+----------+--------------+ Subclavian    None       No        No                   Acute      +----------+------------+---------+-----------+----------+--------------+ Axillary      None       No        No                   Acute      +----------+------------+---------+-----------+----------+--------------+ Brachial      None                 No                   Acute      +----------+------------+---------+-----------+----------+--------------+ Radial        None                                      Acute      +----------+------------+---------+-----------+----------+--------------+ Ulnar          None                                      Acute      +----------+------------+---------+-----------+----------+--------------+  Cephalic      Full                                                 +----------+------------+---------+-----------+----------+--------------+ Basilic                                             Not visualized +----------+------------+---------+-----------+----------+--------------+  Summary:  Right: No evidence of thrombosis in the subclavian.  Left: No evidence of superficial vein thrombosis in the upper extremity. Findings consistent with acute deep vein thrombosis involving the left subclavian vein, left axillary vein, left brachial veins, left radial veins and left ulnar veins. Unable to visualize basilic vein.  *See table(s) above for measurements and observations.  Diagnosing physician: Monica Martinez MD Electronically signed by Monica Martinez MD on 10/26/2019 at 3:53:02 PM.    Final         Scheduled Meds: Continuous Infusions: . sodium chloride 50 mL/hr at 10/26/19 0350  . azithromycin    . cefTRIAXone (ROCEPHIN)  IV    . heparin 1,100 Units/hr (10/26/19 [id number])     LOS: 1 day    Time spent: 25 minutes    Barb Merino, MD [facility] Pager [phone]

## 2019-10-26 NOTE — Progress Notes (Signed)
ANTICOAGULATION CONSULT NOTE - Initial Consult  Pharmacy Consult for heparin Indication: DVT  Allergies  Allergen Reactions  . Shellfish Allergy Itching and Swelling  . Sulfamethoxazole     REACTION: swelling    Patient Measurements:   Heparin Dosing Weight: 63.9 kg Vital Signs: BP: 115/76 (01/15 0700) Pulse Rate: 95 (01/15 0700)  Labs: Recent Labs    10/25/19 1345 10/25/19 1345 10/25/19 2048 10/25/19 2340 10/26/19 0345 10/26/19 0622  HGB 9.9*   < >  --  8.4* 8.4*  --   HCT 32.7*  --   --  28.8* 28.2*  --   PLT 294  --   --  287 289  --   APTT  --   --  30  --  174* 70*  HEPARINUNFRC  --   --  >2.20*  --  >2.20* >2.20*  CREATININE 0.80  --   --  0.73 0.72  --    < > = values in this interval not displayed.    Estimated Creatinine Clearance: 74.6 mL/min (by C-G formula based on SCr of 0.72 mg/dL).   Medical History: Past Medical History:  Diagnosis Date  . Allergic rhinitis   . Hyperlipidemia    LDL 150's '11    Assessment:  54 yo with UE DVT Dx by Dr Veverly Fells at [facility] on Jan 6th.  Eliquis 5 mg po bid started 1/8 per med hx. No record of pt getting loading dose of Eliquis (10 mg bid x 7 days) for DVT. Doppler 1/14 confirms acute DVT LUE, subclavian, left axillary, left brachial, left radial and left ulnar veins. 1/14 CTA neg for PE, positive for L breast tissue mass, L axiallary nodal mass, RLL & RUL  ground glass opacity LD apixaban: 1/14 0700 Baseline heparin level: elevated >2.2 SCr 0.73, Hg 8.4, PLTC WNL/steady. 1/15 0622 HL >2.2 which is expected and APTT 70 within target   No infusion related issues and bleeding reported.  Goal of Therapy:  Heparin level 0.3-0.7 units/ml Monitor platelets by anticoagulation protocol: Yes  Target aPTT is 66-102s (corresponds to HL 0.3-0.7) for ACS/MI, Afib, VTE   Plan:  Titrate heparin using only APTT until effects of apixaban diminished Continue heparin drip at 1100 units/hr Repeat APTT at 1100 today; if  stable then repeat APTT daily Daily CBC, heparin level  Shela Commons, PharmD, BCPS 10/26/2019 8:14 AM

## 2019-10-26 NOTE — Consult Note (Addendum)
[place]  [phone] [phone]   MEDICAL ONCOLOGY - INITIAL CONSULTATION  Referral MD: Dr. Gala Romney  Reason for Referral: Soft tissue mass in the left breast with left axillary lymphadenopathy, mediastinal adenopathy, and sclerotic and lytic bone lesions concerning for metastatic breast cancer  HPI: Ms. Rasch is a 54 year old female with with a past medical history significant for hyperlipidemia and allergic rhinitis. The patient presented to the emergency room with left arm pain.  The patient was diagnosed with a left upper extremity DVT on 10/17/2019.  The patient was started on Eliquis as an outpatient.  She was seen by her orthopedic surgeon on the day of admission due to worsening function in her left hand.  There was concern for radial nerve palsy and suspected compression nerve injury so it was recommended for the patient to present to the emergency room for further evaluation.  In the ER, she had a CT angiogram of the chest which did not show any evidence of PE but did show a rounded soft tissue mass in the left breast concerning for breast cancer, numerous left axillary lymph nodes, mild mediastinal adenopathy, and diffuse sclerotic and lytic lesions throughout the visualized skeleton compatible with osseous metastatic disease.  She was also noted to have a moderate left pleural effusion.  Labs on admission were notable for anemia with a hemoglobin 9.9, potassium 3.3, glucose 124, AST 47, ALT 74, and alkaline phosphatase 364.  Her LFTs were normal on 12/05/2018.  The patient has been started on a heparin drip for her left upper extremity DVT.  Today, the patient reports increased right-sided back pain.  She did not have this prior to admission.  She thinks that some of this is related to laying in the bed.  She is currently sitting up in the recliner at the time of visit.  She reports that she has been feeling well at home except for her left arm pain  and swelling.  Denies anorexia and weight loss.  She has not had any headaches or vision changes.  Denies chest pain, shortness of breath, cough.  Denies abdominal pain, nausea, vomiting, constipation, diarrhea.  She has not noticed any bleeding.  She has not noticed any masses in her breasts or axilla.  The patient has never had a mammogram.  The patient is divorced.  She has 1 daughter.  Her niece was at the bedside at time my visit.  Denies history of alcohol and tobacco use.  She works full-time at [employer].  Denies family history of breast, ovarian, or other cancer.  Medical oncology was asked see the patient for recommendations regarding her abnormal CT scan.    Past Medical History:  Diagnosis Date  . Allergic rhinitis   . Hyperlipidemia    LDL 150's '11  :  Past Surgical History:  Procedure Laterality Date  . CESAREAN SECTION    :  Current Facility-Administered Medications  Medication Dose Route Frequency Provider Last Rate Last Admin  . 0.9 %  sodium chloride infusion   Intravenous Continuous Elwyn Reach, MD 50 mL/hr at 10/26/19 0350 New Bag at 10/26/19 0350  . acetaminophen (TYLENOL) tablet 650 mg  650 mg Oral Q6H PRN Elwyn Reach, MD       Or  . acetaminophen (TYLENOL) suppository 650 mg  650 mg Rectal Q6H PRN Gala Romney L, MD      . azithromycin (ZITHROMAX) 500 mg in sodium chloride 0.9 % 250 mL IVPB  500  mg Intravenous Q24H Gala Romney L, MD      . cefTRIAXone (ROCEPHIN) 1 g in sodium chloride 0.9 % 100 mL IVPB  1 g Intravenous Q24H Garba, Mohammad L, MD      . heparin ADULT infusion 100 units/mL (25000 units/272m sodium chloride 0.45%)  1,100 Units/hr Intravenous Continuous BEudelia Bunch RPH 11 mL/hr at 10/26/19 0822 1,100 Units/hr at 10/26/19 07846 . ondansetron (ZOFRAN) tablet 4 mg  4 mg Oral Q6H PRN GElwyn Reach MD       Or  . ondansetron (ZOFRAN) injection 4 mg  4 mg Intravenous Q6H PRN GElwyn Reach MD         Allergies   Allergen Reactions  . Shellfish Allergy Itching and Swelling  . Sulfamethoxazole     REACTION: swelling  :  Family History  Problem Relation Age of Onset  . Diabetes Mother   . Hypertension Mother   . Hypertension Father   . Hypertension Sister   . Heart disease Sister        MI - no serious damage to heart  . Cancer Neg Hx   :  Social History   Socioeconomic History  . Marital status: Single    Spouse name: Not on file  . Number of children: 3  . Years of education: 152 . Highest education level: Not on file  Occupational History  . Occupation: iArmed forces training and education officer Tobacco Use  . Smoking status: Never Smoker  . Smokeless tobacco: Never Used  Substance and Sexual Activity  . Alcohol use: Yes    Comment: rare use - twice rare  . Drug use: No  . Sexual activity: Yes    Partners: Male  Other Topics Concern  . Not on file  Social History Narrative   [place], 1 dtr - '05, raising niece and nephew. Work - MChief Strategy Officer Marriage in good. No history of abuse. Working on MTakilmaeducation at [employer](oct '12)   Social Determinants of HHenryStrain:   . Difficulty of Paying Living Expenses: Not on file  Food Insecurity:   . Worried About RCharity fundraiserin the Last Year: Not on file  . Ran Out of Food in the Last Year: Not on file  Transportation Needs:   . Lack of Transportation (Medical): Not on file  . Lack of Transportation (Non-Medical): Not on file  Physical Activity:   . Days of Exercise per Week: Not on file  . Minutes of Exercise per Session: Not on file  Stress:   . Feeling of Stress : Not on file  Social Connections:   . Frequency of Communication with Friends and Family: Not on file  . Frequency of Social Gatherings with Friends and Family: Not on file  . Attends Religious Services: Not on file  . Active Member of Clubs or Organizations: Not on file  . Attends CArchivist Meetings: Not on file  . Marital Status: Not on file  Intimate Partner Violence:   . Fear of Current or Ex-Partner: Not on file  . Emotionally Abused: Not on file  . Physically Abused: Not on file  . Sexually Abused: Not on file  :  Review of Systems: A comprehensive 14 point review of systems was negative except as noted in the HPI.  Exam: Patient Vitals for the past 24 hrs:  BP Temp Temp src Pulse Resp SpO2 Height Weight  10/26/19 0858  119/77 98.5 F (36.9 C) Oral (!) 113 17 99 % '5\' 1"'  (1.549 m) 160 lb 9.6 oz (72.8 kg)  10/26/19 0800 113/80 -- -- (!) 102 (!) 25 97 % -- --  10/26/19 0700 115/76 -- -- 95 19 98 % -- --  10/26/19 0630 118/78 -- -- (!) 101 (!) 23 99 % -- --  10/26/19 0600 108/74 -- -- (!) 104 (!) 22 97 % -- --  10/26/19 0500 104/76 -- -- (!) 107 (!) 21 93 % -- --  10/26/19 0400 103/75 -- -- 100 18 96 % -- --  10/26/19 0330 109/76 -- -- 94 17 96 % -- --  10/26/19 0300 102/72 -- -- 88 17 97 % -- --  10/26/19 0230 104/72 -- -- 100 17 95 % -- --  10/26/19 0200 103/78 -- -- 92 19 95 % -- --  10/26/19 0130 112/72 -- -- 100 20 94 % -- --  10/26/19 0100 102/75 -- -- (!) 101 19 94 % -- --  10/26/19 0030 101/75 -- -- (!) 111 (!) 24 96 % -- --  10/26/19 0000 103/85 -- -- (!) 107 (!) 23 99 % -- --  10/25/19 2300 113/76 -- -- (!) 109 (!) 26 97 % -- --  10/25/19 2230 112/78 -- -- 99 19 95 % -- --  10/25/19 2200 115/79 -- -- (!) 106 (!) 24 95 % -- --  10/25/19 2037 106/68 -- -- (!) 115 (!) 22 97 % -- --  10/25/19 1818 (!) 118/92 -- -- (!) 108 (!) 23 98 % -- --  10/25/19 1623 133/88 -- -- (!) 106 18 97 % -- --  10/25/19 1242 126/78 98.2 F (36.8 C) Oral (!) 116 17 100 % -- --    General:  well-nourished in no acute distress.   Eyes:  no scleral icterus.   ENT:  There were no oropharyngeal lesions.   Neck was without thyromegaly.   Lymphatics: Left axillary lymphadenopathy noted.  No cervical, right axillary, or inguinal lymphadenopathy noted. Respiratory: lungs were clear  bilaterally without wheezing or crackles.   Breast exam: No masses palpable in the right breast.  Left breast with a mass and fullness noted in the left outer aspect of the breast. Cardiovascular:  Regular rate and rhythm, S1/S2, without murmur, rub or gallop.  There was no pedal edema.   GI:  abdomen was soft, flat, nontender, nondistended, without organomegaly.   Musculoskeletal:  no spinal tenderness of palpation of vertebral spine.   Skin exam was without echymosis, petichae.   Neuro exam was nonfocal. Patient was alert and oriented.  Attention was good.   Language was appropriate.  Mood was normal without depression.  Speech was not pressured.  Thought content was not tangential.     Lab Results  Component Value Date   WBC 8.4 10/26/2019   HGB 8.4 (L) 10/26/2019   HCT 28.2 (L) 10/26/2019   PLT 289 10/26/2019   GLUCOSE 103 (H) 10/26/2019   CHOL 273 (H) 12/05/2018   TRIG 65.0 12/05/2018   HDL 72.40 12/05/2018   LDLDIRECT 139.4 08/04/2011   LDLCALC 187 (H) 12/05/2018   ALT 61 (H) 10/26/2019   AST 41 10/26/2019   NA 139 10/26/2019   K 3.6 10/26/2019   CL 100 10/26/2019   CREATININE 0.72 10/26/2019   BUN 15 10/26/2019   CO2 27 10/26/2019    DG Chest 2 View  Result Date: 10/25/2019 CLINICAL DATA:  Left upper extremity region pain and edema  EXAM: CHEST - 2 VIEW COMPARISON:  None. FINDINGS: There is atelectatic change in the left base with small left pleural effusion. There is subtle increased opacity in the right upper lobe which may represent earliest changes of pneumonia. Lungs elsewhere clear. Heart size and pulmonary vascularity are normal. No adenopathy. No bone lesions. No pneumothorax. There is soft tissue swelling in the visualized left upper extremity. IMPRESSION: 1.  Small left pleural effusion with left base atelectasis. 2. Suspect earliest changes of pneumonia right upper lobe. Question atypical organism pneumonia. 3.  Cardiac silhouette normal. 4.  No adenopathy. 5.  Soft  tissue swelling of visualized left upper extremity. Electronically Signed   By: Lowella Grip III M.D.   On: 10/25/2019 14:17   CT Angio Chest PE W/Cm &/Or Wo Cm  Result Date: 10/25/2019 CLINICAL DATA:  Swelling and pain in left upper extremity with blood clot. Left upper back pain. EXAM: CT ANGIOGRAPHY CHEST WITH CONTRAST TECHNIQUE: Multidetector CT imaging of the chest was performed using the standard protocol during bolus administration of intravenous contrast. Multiplanar CT image reconstructions and MIPs were obtained to evaluate the vascular anatomy. CONTRAST:  67m OMNIPAQUE IOHEXOL 350 MG/ML SOLN COMPARISON:  None. FINDINGS: Cardiovascular: No filling defects in the pulmonary arteries to suggest pulmonary emboli. The given history of a left axillary vein thrombus cannot be visualized on this study. Heart is normal size. Aorta is normal caliber. Mediastinum/Nodes: There is abnormal soft tissue in the left axilla measuring 4.1 x 2.3 cm on image 30. This is presumably conglomerate nodal mass. Borderline sized mediastinal lymph nodes. Prevascular lymph node measures 11 mm. Enlarged right paratracheal lymph node measures 15 mm. Lungs/Pleura: Moderate-sized left pleural effusion. Atelectasis in the left lower lobe. Ground-glass airspace opacity in the posterior right upper lobe could reflect early infiltrate. Scattered small nodules within the lungs. 3 mm nodule in the right upper lobe on image 41. 3 mm nodule in the left upper lobe on image 47. Clustered nodules seen posteriorly in the superior segment of the right lower lobe. Upper Abdomen: Imaging into the upper abdomen shows no acute findings. Musculoskeletal: Rounded masslike density seen in the left breast measuring 4.2 x 2.9 cm. Recommend correlation with mammography. Mixed lytic and sclerotic lesions diffusely throughout the visualized spine as well as turn Um and manubrium, left scapula, bilateral humeral heads and multiple bilateral ribs  compatible with osseous metastatic disease. Review of the MIP images confirms the above findings. IMPRESSION: No evidence of pulmonary embolus. Rounded soft tissue mass in the left breast. Cannot exclude breast cancer given the below findings. Recommend correlation with mammography. Numerous left axillary lymph nodes with conglomerate nodal mass. There is abnormal appearance of the left latissimus Dorsey and muscles surrounding the left scapula concerning for mass/tumor involvement. Mild mediastinal adenopathy. Diffuse sclerotic and lytic lesions throughout the visualized skeleton compatible with osseous metastatic disease. Ground-glass airspace opacity in the superior segment of the right lower lobe with clustered nodular densities. Similar airspace disease posteriorly in the right upper lobe. Cannot exclude areas of early pneumonia. Moderate left pleural effusion. Compressive atelectasis in the left lower lobe. These results were called by telephone at the time of interpretation on 10/25/2019 at 7:19 pm to provider MARGAUX VENTER , who verbally acknowledged these results. Electronically Signed   By: KRolm BaptiseM.D.   On: 10/25/2019 19:19   UE VENOUS DUPLEX ([facility] & [facility] 7 am - 7 pm)  Result Date: 10/25/2019 UPPER VENOUS STUDY  Indications: Swelling Risk Factors: DVT. Anticoagulation:  Eliquis. Limitations: Body habitus, poor ultrasound/tissue interface and patient pain tolerance. Comparison Study: 10/17/2019-age indeterminate DVT left upper extremity Performing Technologist: Oliver Hum RVT  Examination Guidelines: A complete evaluation includes B-mode imaging, spectral Doppler, color Doppler, and power Doppler as needed of all accessible portions of each vessel. Bilateral testing is considered an integral part of a complete examination. Limited examinations for reoccurring indications may be performed as noted.  Right Findings: +----------+------------+---------+-----------+----------+-------+ RIGHT      CompressiblePhasicitySpontaneousPropertiesSummary +----------+------------+---------+-----------+----------+-------+ Subclavian    Full       Yes       Yes                      +----------+------------+---------+-----------+----------+-------+  Left Findings: +----------+------------+---------+-----------+----------+--------------+ LEFT      CompressiblePhasicitySpontaneousProperties   Summary     +----------+------------+---------+-----------+----------+--------------+ IJV           Full       Yes       Yes                             +----------+------------+---------+-----------+----------+--------------+ Subclavian    None       No        No                   Acute      +----------+------------+---------+-----------+----------+--------------+ Axillary      None       No        No                   Acute      +----------+------------+---------+-----------+----------+--------------+ Brachial      None                 No                   Acute      +----------+------------+---------+-----------+----------+--------------+ Radial        None                                      Acute      +----------+------------+---------+-----------+----------+--------------+ Ulnar         None                                      Acute      +----------+------------+---------+-----------+----------+--------------+ Cephalic      Full                                                 +----------+------------+---------+-----------+----------+--------------+ Basilic                                             Not visualized +----------+------------+---------+-----------+----------+--------------+  Summary:  Right: No evidence of thrombosis in the subclavian.  Left: No evidence of superficial vein thrombosis in the upper extremity. Findings consistent with acute deep vein thrombosis involving the left subclavian vein, left axillary vein, left brachial veins, left  radial veins and left ulnar veins. Unable to  visualize basilic vein.  *See table(s) above for measurements and observations.    Preliminary    VAS [place] UPPER EXTREMITY VENOUS DUPLEX  Result Date: 10/17/2019 UPPER VENOUS STUDY  Indications: Edema, and intermittent left arm pain and swelling since end of 06/2019. Patient denies any trauma, SOB or chest pain. Risk Factors: None identified Patient had difficulty with keeping arm bent or straight during exam due to pain. Anticoagulation: None. Comparison Study: None Performing Technologist: Alecia Mackin RVT, RDCS (AE), RDMS  Examination Guidelines: A complete evaluation includes B-mode imaging, spectral Doppler, color Doppler, and power Doppler as needed of all accessible portions of each vessel. Bilateral testing is considered an integral part of a complete examination. Limited examinations for reoccurring indications may be performed as noted.  Left Findings: +----------+------------+---------+-----------+---------------+----------------+ LEFT      CompressiblePhasicitySpontaneous  Properties       Summary      +----------+------------+---------+-----------+---------------+----------------+ IJV           Full       Yes       Yes                                    +----------+------------+---------+-----------+---------------+----------------+ Subclavian               No        No                     vein not well                                                            seen proximal to                                                             mid chest.    +----------+------------+---------+-----------+---------------+----------------+ Axillary    Partial      No        No          rigid           Age                                                   w/compression  Indeterminate   +----------+------------+---------+-----------+---------------+----------------+ Brachial    Partial      No        No          dilated          Age                                                                  Indeterminate   +----------+------------+---------+-----------+---------------+----------------+ Radial  Full       Yes       Yes                                    +----------+------------+---------+-----------+---------------+----------------+ Ulnar         Full       Yes       Yes                                    +----------+------------+---------+-----------+---------------+----------------+ Cephalic      Full       Yes       Yes                                    +----------+------------+---------+-----------+---------------+----------------+ Basilic       Full       Yes       Yes                                    +----------+------------+---------+-----------+---------------+----------------+ Collaterals present along course of brachial veins. The medial paired brachial vein appears almost occluded from axiallry down to antecubital fossa. Findings reported to Dr. Netta Cedars at 11:45 am.  Summary:  Left: No evidence of superficial vein thrombosis in the upper extremity. Findings consistent with age indeterminate non-occlusive deep vein thrombosis involving the left subclavian vein, left axillary vein and left brachial veins.  *See table(s) above for measurements and observations.  Diagnosing physician: Kathlyn Sacramento MD Electronically signed by Kathlyn Sacramento MD on 10/17/2019 at 12:36:35 PM.    Final      DG Chest 2 View  Result Date: 10/25/2019 CLINICAL DATA:  Left upper extremity region pain and edema EXAM: CHEST - 2 VIEW COMPARISON:  None. FINDINGS: There is atelectatic change in the left base with small left pleural effusion. There is subtle increased opacity in the right upper lobe which may represent earliest changes of pneumonia. Lungs elsewhere clear. Heart size and pulmonary vascularity are normal. No adenopathy. No bone lesions. No pneumothorax. There is soft tissue  swelling in the visualized left upper extremity. IMPRESSION: 1.  Small left pleural effusion with left base atelectasis. 2. Suspect earliest changes of pneumonia right upper lobe. Question atypical organism pneumonia. 3.  Cardiac silhouette normal. 4.  No adenopathy. 5.  Soft tissue swelling of visualized left upper extremity. Electronically Signed   By: Lowella Grip III M.D.   On: 10/25/2019 14:17   CT Angio Chest PE W/Cm &/Or Wo Cm  Result Date: 10/25/2019 CLINICAL DATA:  Swelling and pain in left upper extremity with blood clot. Left upper back pain. EXAM: CT ANGIOGRAPHY CHEST WITH CONTRAST TECHNIQUE: Multidetector CT imaging of the chest was performed using the standard protocol during bolus administration of intravenous contrast. Multiplanar CT image reconstructions and MIPs were obtained to evaluate the vascular anatomy. CONTRAST:  55m OMNIPAQUE IOHEXOL 350 MG/ML SOLN COMPARISON:  None. FINDINGS: Cardiovascular: No filling defects in the pulmonary arteries to suggest pulmonary emboli. The given history of a left axillary vein thrombus cannot be visualized on this study. Heart is normal size. Aorta is normal caliber. Mediastinum/Nodes: There is abnormal soft tissue in the left axilla measuring 4.1 x 2.3 cm  on image 30. This is presumably conglomerate nodal mass. Borderline sized mediastinal lymph nodes. Prevascular lymph node measures 11 mm. Enlarged right paratracheal lymph node measures 15 mm. Lungs/Pleura: Moderate-sized left pleural effusion. Atelectasis in the left lower lobe. Ground-glass airspace opacity in the posterior right upper lobe could reflect early infiltrate. Scattered small nodules within the lungs. 3 mm nodule in the right upper lobe on image 41. 3 mm nodule in the left upper lobe on image 47. Clustered nodules seen posteriorly in the superior segment of the right lower lobe. Upper Abdomen: Imaging into the upper abdomen shows no acute findings. Musculoskeletal: Rounded masslike  density seen in the left breast measuring 4.2 x 2.9 cm. Recommend correlation with mammography. Mixed lytic and sclerotic lesions diffusely throughout the visualized spine as well as turn Um and manubrium, left scapula, bilateral humeral heads and multiple bilateral ribs compatible with osseous metastatic disease. Review of the MIP images confirms the above findings. IMPRESSION: No evidence of pulmonary embolus. Rounded soft tissue mass in the left breast. Cannot exclude breast cancer given the below findings. Recommend correlation with mammography. Numerous left axillary lymph nodes with conglomerate nodal mass. There is abnormal appearance of the left latissimus Dorsey and muscles surrounding the left scapula concerning for mass/tumor involvement. Mild mediastinal adenopathy. Diffuse sclerotic and lytic lesions throughout the visualized skeleton compatible with osseous metastatic disease. Ground-glass airspace opacity in the superior segment of the right lower lobe with clustered nodular densities. Similar airspace disease posteriorly in the right upper lobe. Cannot exclude areas of early pneumonia. Moderate left pleural effusion. Compressive atelectasis in the left lower lobe. These results were called by telephone at the time of interpretation on 10/25/2019 at 7:19 pm to provider MARGAUX VENTER , who verbally acknowledged these results. Electronically Signed   By: Rolm Baptise M.D.   On: 10/25/2019 19:19   UE VENOUS DUPLEX ([facility] & [facility] 7 am - 7 pm)  Result Date: 10/25/2019 UPPER VENOUS STUDY  Indications: Swelling Risk Factors: DVT. Anticoagulation: Eliquis. Limitations: Body habitus, poor ultrasound/tissue interface and patient pain tolerance. Comparison Study: 10/17/2019-age indeterminate DVT left upper extremity Performing Technologist: Oliver Hum RVT  Examination Guidelines: A complete evaluation includes B-mode imaging, spectral Doppler, color Doppler, and power Doppler as needed of all accessible  portions of each vessel. Bilateral testing is considered an integral part of a complete examination. Limited examinations for reoccurring indications may be performed as noted.  Right Findings: +----------+------------+---------+-----------+----------+-------+ RIGHT     CompressiblePhasicitySpontaneousPropertiesSummary +----------+------------+---------+-----------+----------+-------+ Subclavian    Full       Yes       Yes                      +----------+------------+---------+-----------+----------+-------+  Left Findings: +----------+------------+---------+-----------+----------+--------------+ LEFT      CompressiblePhasicitySpontaneousProperties   Summary     +----------+------------+---------+-----------+----------+--------------+ IJV           Full       Yes       Yes                             +----------+------------+---------+-----------+----------+--------------+ Subclavian    None       No        No                   Acute      +----------+------------+---------+-----------+----------+--------------+ Axillary      None       No  No                   Acute      +----------+------------+---------+-----------+----------+--------------+ Brachial      None                 No                   Acute      +----------+------------+---------+-----------+----------+--------------+ Radial        None                                      Acute      +----------+------------+---------+-----------+----------+--------------+ Ulnar         None                                      Acute      +----------+------------+---------+-----------+----------+--------------+ Cephalic      Full                                                 +----------+------------+---------+-----------+----------+--------------+ Basilic                                             Not visualized  +----------+------------+---------+-----------+----------+--------------+  Summary:  Right: No evidence of thrombosis in the subclavian.  Left: No evidence of superficial vein thrombosis in the upper extremity. Findings consistent with acute deep vein thrombosis involving the left subclavian vein, left axillary vein, left brachial veins, left radial veins and left ulnar veins. Unable to visualize basilic vein.  *See table(s) above for measurements and observations.    Preliminary    VAS [place] UPPER EXTREMITY VENOUS DUPLEX  Result Date: 10/17/2019 UPPER VENOUS STUDY  Indications: Edema, and intermittent left arm pain and swelling since end of 06/2019. Patient denies any trauma, SOB or chest pain. Risk Factors: None identified Patient had difficulty with keeping arm bent or straight during exam due to pain. Anticoagulation: None. Comparison Study: None Performing Technologist: Alecia Mackin RVT, RDCS (AE), RDMS  Examination Guidelines: A complete evaluation includes B-mode imaging, spectral Doppler, color Doppler, and power Doppler as needed of all accessible portions of each vessel. Bilateral testing is considered an integral part of a complete examination. Limited examinations for reoccurring indications may be performed as noted.  Left Findings: +----------+------------+---------+-----------+---------------+----------------+ LEFT      CompressiblePhasicitySpontaneous  Properties       Summary      +----------+------------+---------+-----------+---------------+----------------+ IJV           Full       Yes       Yes                                    +----------+------------+---------+-----------+---------------+----------------+ Subclavian               No        No                     vein  not well                                                            seen proximal to                                                             mid chest.     +----------+------------+---------+-----------+---------------+----------------+ Axillary    Partial      No        No          rigid           Age                                                   w/compression  Indeterminate   +----------+------------+---------+-----------+---------------+----------------+ Brachial    Partial      No        No         dilated          Age                                                                  Indeterminate   +----------+------------+---------+-----------+---------------+----------------+ Radial        Full       Yes       Yes                                    +----------+------------+---------+-----------+---------------+----------------+ Ulnar         Full       Yes       Yes                                    +----------+------------+---------+-----------+---------------+----------------+ Cephalic      Full       Yes       Yes                                    +----------+------------+---------+-----------+---------------+----------------+ Basilic       Full       Yes       Yes                                    +----------+------------+---------+-----------+---------------+----------------+ Collaterals present along course of brachial veins. The medial paired brachial vein appears almost occluded from axiallry down to antecubital fossa. Findings reported to Dr. Netta Cedars at 11:45 am.  Summary:  Left: No evidence of superficial vein thrombosis in the upper extremity. Findings consistent with age indeterminate non-occlusive deep vein thrombosis involving the left subclavian vein, left axillary vein and left brachial veins.  *See table(s) above for measurements and observations.  Diagnosing physician: Kathlyn Sacramento MD Electronically signed by Kathlyn Sacramento MD on 10/17/2019 at 12:36:35 PM.    Final    Assessment and Plan:  1.  Left breast soft tissue mass with axillary and mediastinal adenopathy and sclerotic  and lytic bone lesions concerning for metastatic breast cancer 2.  Left upper extremity DVT 3.  Mild anemia 4.  Back pain/spasms  -CT scan results were discussed with the patient and her niece today.  We discussed that findings are concerning for metastatic breast cancer.  Discussed that we recommend completing the staging work-up with a CT of the abdomen pelvis with contrast.  We also discussed proceeding with a biopsy.  Recommend a ultrasound-guided core biopsy of the left axillary lymph nodes.  We discussed that treatment options would be discussed once diagnosis is confirmed.  I have placed a referral to radiation oncology as the patient may benefit to radiation to her left axillary lymph nodes due to the increased left arm swelling and concern for compression nerve injury. -Agree with continuation of heparin for her left upper extremity DVT.  We can transition back to Eliquis once her procedures are completed. -The patient has mild anemia.  We will continue to watch this.  No transfusion is indicated. -The patient was having significant back pain/spasms to the right side of her back at the time of visit.  I have ordered hydrocodone 5/325 mg 1 tablet every 6 hours as needed for pain and Robaxin 500 mg every 8 hours as needed for muscle spasms.  We will arrange for outpatient follow-up at the cancer center to discuss her biopsy results and treatment options.  Please call medical oncology over the weekend if there are questions.  Thank you for this referral.   Mikey Bussing, DNP, AGPCNP-BC, AOCNP  Attending Note  I personally saw the patient, reviewed the chart and examined the patient. The plan of care was discussed with the patient and the admitting team. I agree with the assessment and plan as documented above. Thank you very much for the consultation.  -Clinical suspicion for metastatic breast cancer: Biopsies have been ordered and results are pending.  We will have to wait for the ER/PR  and HER-2/neu test results to discuss treatment options. Liver biopsy has been performed. -DVT: Anticoagulation I discussed with her extensively about broad overview of treatment options.  Even though the treatments are not curative they can prolong her life and can give quality of life as well.  I will follow her up as outpatient to go over the results and come up with the treatment plan.   Patient has requested follow-up with Dr. Benay Spice.  That can be arranged as an outpatient

## 2019-10-26 NOTE — Progress Notes (Signed)
ANTICOAGULATION CONSULT NOTE - Initial Consult  Pharmacy Consult for heparin Indication: DVT  Allergies  Allergen Reactions  . Shellfish Allergy Itching and Swelling  . Sulfamethoxazole     REACTION: swelling    Patient Measurements: Height: 5\' 1"  (154.9 cm) Weight: 160 lb 9.6 oz (72.8 kg) IBW/kg (Calculated) : 47.8 Heparin Dosing Weight: 63.9 kg Vital Signs: Temp: 98.7 F (37.1 C) (01/15 1349) Temp Source: Oral (01/15 1349) BP: 119/79 (01/15 1349) Pulse Rate: 112 (01/15 1349)  Labs: Recent Labs    10/25/19 1345 10/25/19 1345 10/25/19 2048 10/25/19 2048 10/25/19 2340 10/26/19 0345 10/26/19 0622 10/26/19 1304  HGB 9.9*   < >  --   --  8.4* 8.4*  --   --   HCT 32.7*  --   --   --  28.8* 28.2*  --   --   PLT 294  --   --   --  287 289  --   --   APTT  --   --  30   < >  --  174* 70* 68*  HEPARINUNFRC  --   --  >2.20*  --   --  >2.20* >2.20*  --   CREATININE 0.80  --   --   --  0.73 0.72  --   --    < > = values in this interval not displayed.    Estimated Creatinine Clearance: 74.2 mL/min (by C-G formula based on SCr of 0.72 mg/dL).   Medical History: Past Medical History:  Diagnosis Date  . Allergic rhinitis   . Hyperlipidemia    LDL 150's '11    Assessment:  54 yo with UE DVT Dx by Dr Veverly Fells at [facility] Jan 6[facility].  Eliquis 5 mg po bid started 1/8 per med hx. No record of pt getting loading dose of Eliquis (10 mg bid x 7 days) for DVT. Doppler 1/14 confirms acute DVT LUE, subclavian, left axillary, left brachial, left radial and left ulnar veins. 1/14 CTA neg for PE, positive for L breast tissue mass, L axiallary nodal mass, RLL & RUL  ground glass opacity LD apixaban: 1/14 0700 Baseline heparin level: elevated >2.2 SCr 0.73, Hg 8.4, PLTC WNL/steady. 1/15 0622 HL >2.2 which is expected and APTT 70 within target   1/15 1304 APTT 68 remains in target and steady No infusion related issues and bleeding reported.  Goal of Therapy:  Heparin level  0.3-0.7 units/ml Monitor platelets by anticoagulation protocol: Yes  Target APTT is 66-102s (corresponds to HL 0.3-0.7) for ACS/MI, Afib, VTE   Plan:  Continue heparin drip at 1100 units/hr Repeat APTT daily until daily HL is able to be used When HL at or below goal range and do correlate with the aPTT, will discontinue APTTs and use HLs only. Monitor daily CBC, s/s bleeding  Shela Commons, PharmD, BCPS 10/26/2019 1:59 PM

## 2019-10-26 NOTE — ED Notes (Signed)
Pt provided breakfast tray.

## 2019-10-26 NOTE — Progress Notes (Addendum)
  Request for biopsy.  Images reviewed by Dr. Annamaria Boots.  Appears to be metastatic breast cancer.  Recommend workup as outpatient with Diagnostic mammogram, [place] and then biopsy.  I tried to reach Mikey Bussing, NP. I called [phone] but no answer.  I will try reaching her via Epic messaging.  Ragnar Waas S Eila Runyan PA-C 10/26/2019 2:54 PM

## 2019-10-27 LAB — CBC
HCT: 27.8 % — ABNORMAL LOW (ref 36.0–46.0)
Hemoglobin: 8.2 g/dL — ABNORMAL LOW (ref 12.0–15.0)
MCH: 27 pg (ref 26.0–34.0)
MCHC: 29.5 g/dL — ABNORMAL LOW (ref 30.0–36.0)
MCV: 91.4 fL (ref 80.0–100.0)
Platelets: 266 10*3/uL (ref 150–400)
RBC: 3.04 MIL/uL — ABNORMAL LOW (ref 3.87–5.11)
RDW: 14.6 % (ref 11.5–15.5)
WBC: 8.1 10*3/uL (ref 4.0–10.5)
nRBC: 2.2 % — ABNORMAL HIGH (ref 0.0–0.2)

## 2019-10-27 LAB — APTT: aPTT: 71 seconds — ABNORMAL HIGH (ref 24–36)

## 2019-10-27 LAB — HEPARIN LEVEL (UNFRACTIONATED): Heparin Unfractionated: 1.08 IU/mL — ABNORMAL HIGH (ref 0.30–0.70)

## 2019-10-27 NOTE — Evaluation (Signed)
Occupational Therapy Evaluation Patient Details Name: Abigail [name] MRN: [id number] DOB: [birth year] Today's Date: 10/27/2019    History of Present Illness Pt is a 54 year old woman with h/o HLD admitted 10/25/19 with worsening pain, weakness and edema of L UE. + for L UE DVT an suspect metastatic breast cancer.   Clinical Impression   Pt was independent prior to admission. She lives with her 35 year old daughter and works as an Chief of Staff. Pt presents with significant edema, weakness in L UE with limited PROM of shoulder. Pt reports throbbing with L UE in dependent position. Pt educated elevation and AAROM elbow to hand. Instructed in compensatory strategies for ADL. Will continue to evaluate for wrist splint at per MD order.     Follow Up Recommendations  Outpatient OT    Equipment Recommendations  None recommended by OT    Recommendations for Other Services       Precautions / Restrictions Precautions Precaution Comments: Heparin drip, therapeutic PTT      Mobility Bed Mobility                  Transfers Overall transfer level: Modified independent               General transfer comment: pushed IV pole    Balance                                           ADL either performed or assessed with clinical judgement   ADL Overall ADL's : Needs assistance/impaired Eating/Feeding: Minimal assistance;Sitting Eating/Feeding Details (indicate cue type and reason): assist to open packages, cut food Grooming: Brushing hair;Sitting;Total assistance;Oral care;Set up   Upper Body Bathing: Moderate assistance;Sitting   Lower Body Bathing: Minimal assistance;Sit to/from stand   Upper Body Dressing : Minimal assistance;Sitting   Lower Body Dressing: Minimal assistance;Sit to/from stand   Toilet Transfer: Modified Independent   Toileting- Clothing Manipulation and Hygiene: Modified independent       Functional mobility during ADLs:  Modified independent(pushing IV pole) General ADL Comments: educated in edema management, AAROM elbow to hand and towel exercises on tray table     Vision Baseline Vision/History: No visual deficits       Perception     Praxis      Pertinent Vitals/Pain Pain Assessment: Faces Faces Pain Scale: Hurts a little bit Pain Location: L UE Pain Descriptors / Indicators: Throbbing Pain Intervention(s): Monitored during session     Hand Dominance Right   Extremity/Trunk Assessment Upper Extremity Assessment Upper Extremity Assessment: LUE deficits/detail LUE Deficits / Details: significant edema, shoulder PROM limited to 70 degrees FF with firm end range, full PROM elbow to hand, but AROM limited by edema. Strength 2/5 throughout. LUE Coordination: decreased fine motor;decreased gross motor   Lower Extremity Assessment Lower Extremity Assessment: Overall WFL for tasks assessed       Communication Communication Communication: HOH   Cognition Arousal/Alertness: Awake/alert Behavior During Therapy: WFL for tasks assessed/performed Overall Cognitive Status: Within Functional Limits for tasks assessed                                     General Comments       Exercises     Shoulder Instructions      Home Living Family/patient expects to  be discharged to:: Private residence Living Arrangements: Children(42 year old daughter) Available Help at Discharge: Family;Available 24 hours/day Type of Home: House       Home Layout: Multi-level Alternate Level Stairs-Number of Steps: flight   Bathroom Shower/Tub: Occupational psychologist: Standard     Home Equipment: None          Prior Functioning/Environment Level of Independence: Independent        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 Problem List: Decreased strength;Decreased range of motion;Pain;Impaired UE functional use;Decreased coordination      OT  Treatment/Interventions: Self-care/ADL training;Patient/family education;Therapeutic activities;Therapeutic exercise    OT Goals(Current goals can be found in the care plan section) Acute Rehab OT Goals Patient Stated Goal: figure out the next steps for her treatment OT Goal Formulation: With patient Time For Goal Achievement: 11/10/19 Potential to Achieve Goals: Good  OT Frequency: Min 2X/week   Barriers to D/C:            Co-evaluation              AM-PAC OT "6 Clicks" Daily Activity     Outcome Measure Help from another person eating meals?: A Little Help from another person taking care of personal grooming?: A Lot Help from another person toileting, which includes using toliet, bedpan, or urinal?: None Help from another person bathing (including washing, rinsing, drying)?: A Lot Help from another person to put on and taking off regular upper body clothing?: A Little Help from another person to put on and taking off regular lower body clothing?: A Little 6 Click Score: 17   End of Session    Activity Tolerance:   Patient left: in chair;with call bell/phone within reach  OT Visit Diagnosis: Muscle weakness (generalized) (M62.81)                Time: [id number] OT Time Calculation (min): 30 min Charges:  OT General Charges $OT Visit: 1 Visit OT Evaluation $OT Eval Moderate Complexity: 1 Mod OT Treatments $Therapeutic Activity: 8-22 mins  Nestor Lewandowsky, OTR/L Acute Rehabilitation Services Pager: [phone] Office: [phone]  Malka So 10/27/2019, 10:53 AM

## 2019-10-27 NOTE — Progress Notes (Signed)
ANTICOAGULATION CONSULT NOTE - Follow Up Consult  Pharmacy Consult for heparin Indication: acute UE DVT  Allergies  Allergen Reactions  . Shellfish Allergy Itching and Swelling  . Sulfamethoxazole     REACTION: swelling    Patient Measurements: Height: 5\' 1"  (154.9 cm) Weight: 160 lb 9.6 oz (72.8 kg) IBW/kg (Calculated) : 47.8 Heparin Dosing Weight: 64 kg  Vital Signs: Temp: 98.7 F (37.1 C) (01/16 0607) Temp Source: Oral (01/15 2130) BP: 102/69 (01/16 0607) Pulse Rate: 95 (01/16 0607)  Labs: Recent Labs    10/25/19 1345 10/25/19 2048 10/25/19 2340 10/26/19 0345 10/26/19 0345 10/26/19 0622 10/26/19 1304 10/27/19 0558  HGB 9.9*   < > 8.4* 8.4*  --   --   --  8.2*  HCT 32.7*  --  28.8* 28.2*  --   --   --  27.8*  PLT 294  --  287 289  --   --   --  266  APTT  --    < >  --  174*   < > 70* 68* 71*  HEPARINUNFRC  --    < >  --  >2.20*  --  >2.20*  --  1.08*  CREATININE 0.80  --  0.73 0.72  --   --   --   --    < > = values in this interval not displayed.    Estimated Creatinine Clearance: 74.2 mL/min (by C-G formula based on SCr of 0.72 mg/dL).   Medications:  - Eliquis 5 mg bid PTA (last dose taken on 1/14 at 0700)  Assessment: Patient is a 54 y.o F recently placed on Eliquis for DVT noted on UE doppler on 1/6.  She presented to the ED on 1/14 with c/o left arm/fingers swelling and pain. UE doppler on 1/14 showed acute left upper extremity DVT.  She's now being worked up for suspected metastatic breast cancer with plan for left axillary lymph nodes biopsy.  Heparin was started on admission for DVT.  - 1/6 UE doppler: age indeterminate non-occlusive deep vein thrombosis involving the left subclavian vein, left axillary vein and left brachial veins.  - 1/14 chest CTA: neg for PE - 1/14 LE doppler: acute DVT involving the left subclavian vein, left axillary vein, left brachial veins, left radial veins and left ulnar veins.  Today, 10/27/2019: - aPTT is therapeutic  at 71 sec;  heparin level is elevated at 1.08 but this is likely reflective of residual effect of Eliquis - hgb low but stable, plts ok - no bleeding documented  Goal of Therapy:  Heparin level 0.3-0.7 units/ml aPTT 66-102 seconds Monitor platelets by anticoagulation protocol: Yes   Plan:  - continue with heparin drip at 1100 units/hr - will monitor and adjust heparin rate based on aPTT for now until heparin and aPTT levels correlate, then dose based on heparin level alone - monitor for s/s bleeding - please advise if/when heparin drip needs to be held for biopsy procedure  Abigail [name] P 10/27/2019,7:44 AM

## 2019-10-27 NOTE — Progress Notes (Signed)
PROGRESS NOTE  Abigail [name] [id number] DOB: May 17, 1966 DOA: 10/25/2019 PCP: Hoyt Koch, MD  HPI/Recap of past 24 hours: Patient is a 54 year old female with history of hyperlipidemia, no much medical issues who suffered some minor injury on her left shoulder 3 months ago and had been having left arm pain since then.  Patient continued to have pain that prompted an upper extremity duplex that showed DVT on 1/6.  Patient was initiated on Eliquis.  Patient continued to have worsening pain on her hands, she was unable to lift up her wrist so came to emergency room last night.  In the emergency room, she had obvious swelling of the left upper extremity, hemodynamically stable.  95% on room air.  Hemoglobin 9.9.  Covid negative.  CT angiogram of the chest showed no evidence of PE, groundglass opacities right upper lobe, conglomerate of lymphadenopathy on the left axilla, mediastinal lymphadenopathy sclerotic and lytic lesions throughout the skeleton as well as soft tissue mass on the left breast. Repeat duplex consistent with acute DVT left subclavian, left axillary, left brachial and left radial and left ulnar veins.  No evidence of compartment syndrome, distal arterial supply intact.   10/27/19: Seen and examined.  Reports significant discomfort with swelling in left upper extremity from DVT.  Advised to elevate her left upper extremity.  We will continue IV heparin over the weekend.  We will switch to subcu Lovenox prior to DC.  Curb sided with oncology Dr. Alvy Bimler.  We will follow up with oncology on Monday.  Will obtain biopsies outpatient.  Assessment/Plan: Principal Problem:   Acute deep vein thrombosis (DVT) of axillary vein of left upper extremity (HCC) Active Problems:   Community acquired pneumonia   Radial nerve palsy   Breast mass, left   DVT (deep vein thrombosis) in pregnancy   Hypokalemia  Left upper extremity DVT of multiple veins: Was on Eliquis.  Developed new blood  clots on Eliquis. Persistent significant swelling and discomfort.  Continue heparin drip over the weekend. Continue pain management Will follow-up with oncology on Monday  Presumed metastatic breast cancer: Clinical and radiological evidence consistent with metastatic breast cancer.  Probably with brachial plexus compression.  Significant left upper extremity swelling. We will follow up with oncology on Monday. Curb sided and discussed with oncology Dr. Alvy Bimler Biopsy will be done outpatient Continue pain management  We will switch to full dose Lovenox at discharge  She never had any screening mammogram.  She never had any screening colonoscopy.  Resolved hypokalemia: Replaced.  Community-acquired pneumonia: Suspected.  Cultures negative so far.  More likely metastatic lesion.    On Rocephin and azithromycin.  Obtain procalcitonin level in the morning, if negative DC antibiotics    DVT prophylaxis: Heparin infusion Code Status: Full code Family Communication: None Disposition Plan:  Patient is currently not appropriate for discharge at this time due to ongoing significant left upper extremity pain and swelling.  Patient will require at least 2 additional midnights for treatment with heparin drip of present condition.   Consultants:   Oncology  Procedures:   None     Objective: Vitals:   10/26/19 1349 10/26/19 1800 10/26/19 2130 10/27/19 0607  BP: 119/79 116/78 104/66 102/69  Pulse: (!) 112 (!) 115 95 95  Resp: 18 18 17 16   Temp: 98.7 F (37.1 C) 98.4 F (36.9 C) 97.6 F (36.4 C) 98.7 F (37.1 C)  TempSrc: Oral Oral Oral   SpO2: 97% 94% 97% 96%  Weight:  Height:        Intake/Output Summary (Last 24 hours) at 10/27/2019 1250 Last data filed at 10/27/2019 1000 Gross per 24 hour  Intake 2019.01 ml  Output --  Net 2019.01 ml   Filed Weights   10/26/19 0858  Weight: 72.8 kg    Exam:  . General: 54 y.o. year-old female well developed well nourished  in no acute distress.  Alert and oriented x3. . Cardiovascular: Regular rate and rhythm with no rubs or gallops.  No thyromegaly or JVD noted.   Marland Kitchen Respiratory: Clear to auscultation with no wheezes or rales. Good inspiratory effort. . Abdomen: Soft nontender nondistended with normal bowel sounds x4 quadrants. . Musculoskeletal: Left upper extremity significantly edematous with tenderness on palpation.  From shoulder to fingers. Marland Kitchen Psychiatry: Mood is appropriate for condition and setting   Data Reviewed: CBC: Recent Labs  Lab 10/25/19 1345 10/25/19 2340 10/26/19 0345 10/27/19 0558  WBC 9.1 8.3 8.4 8.1  NEUTROABS 5.8  --   --   --   HGB 9.9* 8.4* 8.4* 8.2*  HCT 32.7* 28.8* 28.2* 27.8*  MCV 91.6 91.4 91.3 91.4  PLT 294 287 289 [id number]   Basic Metabolic Panel: Recent Labs  Lab 10/25/19 1345 10/25/19 2340 10/26/19 0345  NA 139  --  139  K 3.3*  --  3.6  CL 98  --  100  CO2 27  --  27  GLUCOSE 124*  --  103*  BUN 16  --  15  CREATININE 0.80 0.73 0.72  CALCIUM 10.0  --  9.3   GFR: Estimated Creatinine Clearance: 74.2 mL/min (by C-G formula based on SCr of 0.72 mg/dL). Liver Function Tests: Recent Labs  Lab 10/25/19 1345 10/26/19 0345  AST 47* 41  ALT 74* 61*  ALKPHOS 364* 307*  BILITOT 0.3 0.5  PROT 8.0 7.0  ALBUMIN 3.7 3.3*   No results for input(s): LIPASE, AMYLASE in the last 168 hours. No results for input(s): AMMONIA in the last 168 hours. Coagulation Profile: No results for input(s): INR, PROTIME in the last 168 hours. Cardiac Enzymes: No results for input(s): CKTOTAL, CKMB, CKMBINDEX, TROPONINI in the last 168 hours. BNP (last 3 results) No results for input(s): PROBNP in the last 8760 hours. HbA1C: No results for input(s): HGBA1C in the last 72 hours. CBG: No results for input(s): GLUCAP in the last 168 hours. Lipid Profile: No results for input(s): CHOL, HDL, LDLCALC, TRIG, CHOLHDL, LDLDIRECT in the last 72 hours. Thyroid Function Tests: No results for  input(s): TSH, T4TOTAL, FREET4, T3FREE, THYROIDAB in the last 72 hours. Anemia Panel: No results for input(s): VITAMINB12, FOLATE, FERRITIN, TIBC, IRON, RETICCTPCT in the last 72 hours. Urine analysis:    Component Value Date/Time   COLORURINE LT. YELLOW 08/04/2011 0805   APPEARANCEUR Cloudy 08/04/2011 0805   LABSPEC 1.020 08/04/2011 0805   PHURINE 6.0 08/04/2011 0805   GLUCOSEU NEGATIVE 08/04/2011 0805   HGBUR LARGE 08/04/2011 0805   BILIRUBINUR NEGATIVE 08/04/2011 0805   KETONESUR NEGATIVE 08/04/2011 0805   UROBILINOGEN 0.2 08/04/2011 0805   NITRITE NEGATIVE 08/04/2011 0805   LEUKOCYTESUR NEGATIVE 08/04/2011 0805   Sepsis Labs: @LABRCNTIP (procalcitonin:4,lacticidven:4)  ) Recent Results (from the past 240 hour(s))  SARS CORONAVIRUS 2 (TAT 6-24 HRS) Nasopharyngeal Nasopharyngeal Swab     Status: None   Collection Time: 10/25/19  8:48 PM   Specimen: Nasopharyngeal Swab  Result Value Ref Range Status   SARS Coronavirus 2 NEGATIVE NEGATIVE Final    Comment: (NOTE) SARS-CoV-2 target  nucleic acids are NOT DETECTED. The SARS-CoV-2 RNA is generally detectable in upper and lower respiratory specimens during the acute phase of infection. Negative results do not preclude SARS-CoV-2 infection, do not rule out co-infections with other pathogens, and should not be used as the sole basis for treatment or other patient management decisions. Negative results must be combined with clinical observations, patient history, and epidemiological information. The expected result is Negative. Fact Sheet for Patients: SugarRoll.be Fact Sheet for Healthcare Providers: https://www.woods-mathews.com/ This test is not yet approved or cleared by the [place] FDA and  has been authorized for detection and/or diagnosis of SARS-CoV-2 by FDA under an Emergency Use Authorization (EUA). This EUA will remain  in effect (meaning this test can be used) for the  duration of the COVID-19 declaration under Section 56 4(b)(1) of the Act, 21 U.S.C. section 360bbb-3(b)(1), unless the authorization is terminated or revoked sooner. Performed at [facility], [place] [address], [place], [place] [address]       Studies: CT ABDOMEN PELVIS W CONTRAST  Result Date: 10/26/2019 CLINICAL DATA:  Followup chest CT demonstrating metastatic disease and left breast mass EXAM: CT ABDOMEN AND PELVIS WITH CONTRAST TECHNIQUE: Multidetector CT imaging of the abdomen and pelvis was performed using the standard protocol following bolus administration of intravenous contrast. CONTRAST:  133mL OMNIPAQUE IOHEXOL 300 MG/ML  SOLN COMPARISON:  Chest CT from yesterday. FINDINGS: Lower chest: 4 cm enhancing left breast mass with extensive lymphadenopathy involving the left axilla and tumor involving the left chest wall including the latissimus dorsi and serratus anterior muscles. Left pleural effusion with overlying atelectasis and pulmonary nodules suspicious for pulmonary metastatic disease. Hepatobiliary: Large ill-defined lesion in segment 4 B of the liver worrisome for a metastatic focus. I do not see any other definite liver lesions. The gallbladder is unremarkable. No common bile duct dilatation. The portal and hepatic veins are patent. Pancreas: No mass, inflammation or ductal dilatation. Spleen: Normal size.  No focal lesions. Adrenals/Urinary Tract: The adrenal glands and kidneys are unremarkable. The bladder is normal. Stomach/Bowel: The stomach, duodenum, small bowel and colon are unremarkable. No acute inflammatory changes, mass lesions or obstructive findings. The terminal ileum is normal. The appendix is normal. Moderate stool noted in the mildly distended rectum. Vascular/Lymphatic: The aorta and branch vessels are patent. The major venous structures are patent. No mesenteric or retroperitoneal mass or adenopathy. No pelvic adenopathy. Reproductive: Mildly enlarged fibroid  uterus noted. The ovaries appear normal. Other: No free pelvic fluid collections. No inguinal mass or adenopathy. Musculoskeletal: Diffuse osseous metastatic disease with mixed lytic and sclerotic lesions throughout the spine, ribs and pelvis and both hips. No pathologic fracture and no spinal canal compromise. IMPRESSION: 1. Ill-defined 3.5 cm lesion in segment 4B of the liver, likely a metastatic focus. 2. No other abdominal/pelvic metastatic disease identified. 3. Diffuse osseous metastatic disease with mixed lytic and sclerotic process. No pathologic fracture or spinal canal compromise. 4. Left pleural effusion and small metastatic pulmonary nodules. 5. Extensive left breast and chest wall disease as better seen on recent chest CT. Electronically Signed   By: Marijo Sanes M.D.   On: 10/26/2019 17:01    Scheduled Meds:  Continuous Infusions: . sodium chloride 50 mL/hr at 10/27/19 1000  . azithromycin 500 mg (10/26/19 2132)  . cefTRIAXone (ROCEPHIN)  IV 1 g (10/26/19 2030)  . heparin 1,100 Units/hr (10/27/19 1000)     LOS: 2 days     Kayleen Memos, MD [facility] Pager  [phone]  If 7PM-7AM, please contact night-coverage [email or web address] Password [facility] 10/27/2019, 12:50 PM

## 2019-10-28 LAB — COMPREHENSIVE METABOLIC PANEL
ALT: 49 U/L — ABNORMAL HIGH (ref 0–44)
AST: 31 U/L (ref 15–41)
Albumin: 3.1 g/dL — ABNORMAL LOW (ref 3.5–5.0)
Alkaline Phosphatase: 249 U/L — ABNORMAL HIGH (ref 38–126)
Anion gap: 13 (ref 5–15)
BUN: 11 mg/dL (ref 6–20)
CO2: 23 mmol/L (ref 22–32)
Calcium: 9.3 mg/dL (ref 8.9–10.3)
Chloride: 105 mmol/L (ref 98–111)
Creatinine, Ser: 0.7 mg/dL (ref 0.44–1.00)
GFR calc Af Amer: >60 mL/min (ref >60)
GFR calc non Af Amer: >60 mL/min (ref >60)
Glucose, Bld: 91 mg/dL (ref 70–99)
Potassium: 3.6 mmol/L (ref 3.5–5.1)
Sodium: 141 mmol/L (ref 135–145)
Total Bilirubin: 0.8 mg/dL (ref 0.3–1.2)
Total Protein: 6.7 g/dL (ref 6.5–8.1)

## 2019-10-28 LAB — APTT
aPTT: 63 seconds — ABNORMAL HIGH (ref 24–36)
aPTT: 59 seconds — ABNORMAL HIGH (ref 24–36)
aPTT: 52 seconds — ABNORMAL HIGH (ref 24–36)

## 2019-10-28 LAB — HEPARIN LEVEL (UNFRACTIONATED)
Heparin Unfractionated: 0.87 IU/mL — ABNORMAL HIGH (ref 0.30–0.70)
Heparin Unfractionated: 0.89 IU/mL — ABNORMAL HIGH (ref 0.30–0.70)
Heparin Unfractionated: 0.84 IU/mL — ABNORMAL HIGH (ref 0.30–0.70)

## 2019-10-28 LAB — CBC
HCT: 27.3 % — ABNORMAL LOW (ref 36.0–46.0)
Hemoglobin: 7.9 g/dL — ABNORMAL LOW (ref 12.0–15.0)
MCH: 26.9 pg (ref 26.0–34.0)
MCHC: 28.9 g/dL — ABNORMAL LOW (ref 30.0–36.0)
MCV: 92.9 fL (ref 80.0–100.0)
Platelets: 285 10*3/uL (ref 150–400)
RBC: 2.94 MIL/uL — ABNORMAL LOW (ref 3.87–5.11)
RDW: 14.9 % (ref 11.5–15.5)
WBC: 7.1 10*3/uL (ref 4.0–10.5)
nRBC: 2.8 % — ABNORMAL HIGH (ref 0.0–0.2)

## 2019-10-28 LAB — PROCALCITONIN: Procalcitonin: 0.1 ng/mL

## 2019-10-28 LAB — MAGNESIUM: Magnesium: 2 mg/dL (ref 1.7–2.4)

## 2019-10-28 LAB — LACTIC ACID, PLASMA: Lactic Acid, Venous: 1.4 mmol/L (ref 0.5–1.9)

## 2019-10-28 LAB — PHOSPHORUS: Phosphorus: 4.7 mg/dL — ABNORMAL HIGH (ref 2.5–4.6)

## 2019-10-28 MED ORDER — HEPARIN (PORCINE) 25000 UT/250ML-% IV SOLN
1150.0000 [IU]/h | INTRAVENOUS | Status: DC
  Filled 2019-10-28: qty 250

## 2019-10-28 MED ORDER — AZITHROMYCIN 250 MG PO TABS
500.0000 mg | ORAL_TABLET | Freq: Every day | ORAL | Status: DC
  Administered 2019-10-28: 22:00:00 500 mg via ORAL
  Filled 2019-10-28: qty 2

## 2019-10-28 MED ORDER — HEPARIN (PORCINE) 25000 UT/250ML-% IV SOLN
1450.0000 [IU]/h | INTRAVENOUS | Status: DC
  Administered 2019-10-28: 19:00:00 1250 [IU]/h via INTRAVENOUS
  Filled 2019-10-28: qty 250

## 2019-10-28 NOTE — Progress Notes (Signed)
ANTICOAGULATION CONSULT NOTE - Follow Up Consult  Pharmacy Consult for Heparin Indication: Acute UE DVT  Allergies  Allergen Reactions  . Shellfish Allergy Itching and Swelling  . Sulfamethoxazole     REACTION: swelling    Patient Measurements: Height: 5\' 1"  (154.9 cm) Weight: 160 lb 9.6 oz (72.8 kg) IBW/kg (Calculated) : 47.8 Heparin Dosing Weight:   Vital Signs: Temp: 98.3 F (36.8 C) (01/17 2002) Temp Source: Oral (01/17 2002) BP: 121/67 (01/17 2002) Pulse Rate: 101 (01/17 2002)  Labs: Recent Labs    10/25/19 2340 10/25/19 2340 10/26/19 0345 10/26/19 0622 10/27/19 0558 10/27/19 0558 10/28/19 0518 10/28/19 0615 10/28/19 1420 10/28/19 2114  HGB 8.4*   < > 8.4*   < > 8.2*  --  7.9*  --   --   --   HCT 28.8*   < > 28.2*  --  27.8*  --  27.3*  --   --   --   PLT 287   < > 289  --  266  --  285  --   --   --   APTT  --   --  174*   < > 71*   < > 63*  --  59* 52*  HEPARINUNFRC  --   --  >2.20*   < > 1.08*   < > 0.87*  --  0.89* 0.84*  CREATININE 0.73  --  0.72  --   --   --   --  0.70  --   --    < > = values in this interval not displayed.    Estimated Creatinine Clearance: 74.2 mL/min (by C-G formula based on SCr of 0.7 mg/dL).   Medications:  Infusions:  . sodium chloride 50 mL/hr at 10/28/19 2019  . cefTRIAXone (ROCEPHIN)  IV 1 g (10/28/19 2020)  . heparin 1,250 Units/hr (10/28/19 1907)    Assessment: Patient with heparin level above goal but PTT below goal.  PTT ordered with Heparin level until both correlate due to possible drug-lab interaction between oral anticoagulant (rivaroxaban, edoxaban, or apixaban) and anti-Xa level (aka heparin level) No heparin issues per RN.  Goal of Therapy:  Heparin level 0.3-0.7 units/ml aPTT 66-102 seconds Monitor platelets by anticoagulation protocol: Yes   Plan:  Increase heparin to 1450 units/hr Recheck levels at 0800  Tyler Deis, Shea Stakes [place]ford 10/28/2019,10:55 PM

## 2019-10-28 NOTE — Progress Notes (Addendum)
ANTICOAGULATION CONSULT NOTE - Follow Up Consult  Pharmacy Consult for heparin Indication: acute UE DVT  Allergies  Allergen Reactions  . Shellfish Allergy Itching and Swelling  . Sulfamethoxazole     REACTION: swelling    Patient Measurements: Height: 5\' 1"  (154.9 cm) Weight: 160 lb 9.6 oz (72.8 kg) IBW/kg (Calculated) : 47.8 Heparin Dosing Weight: 64 kg  Vital Signs: Temp: 98.3 F (36.8 C) (01/17 0637) Temp Source: Oral (01/17 0637) BP: 92/61 (01/17 [id number]) Pulse Rate: 92 (01/17 0637)  Labs: Recent Labs    10/25/19 2340 10/25/19 2340 10/26/19 0345 10/26/19 0345 10/26/19 0622 10/26/19 0622 10/26/19 1304 10/27/19 0558 10/28/19 0518 10/28/19 0615  HGB 8.4*   < > 8.4*   < >  --   --   --  8.2* 7.9*  --   HCT 28.8*   < > 28.2*  --   --   --   --  27.8* 27.3*  --   PLT 287   < > 289  --   --   --   --  266 285  --   APTT  --    < > 174*   < > 70*   < > 68* 71* 63*  --   HEPARINUNFRC  --    < > >2.20*   < > >2.20*  --   --  1.08* 0.87*  --   CREATININE 0.73  --  0.72  --   --   --   --   --   --  0.70   < > = values in this interval not displayed.    Estimated Creatinine Clearance: 74.2 mL/min (by C-G formula based on SCr of 0.7 mg/dL).   Medications:  - Eliquis 5 mg bid PTA (last dose taken on 1/14 at 0700)  Assessment: Patient is a 54 y.o F recently placed on Eliquis for DVT noted on UE doppler on 1/6.  She presented to the ED on 1/14 with c/o left arm/fingers swelling and pain. UE doppler on 1/14 showed acute left upper extremity DVT.  She's now being worked up for suspected metastatic breast cancer with plan to do left axillary lymph nodes biopsy outpatient.  Heparin was started on admission for DVT.  - 1/6 UE doppler: age indeterminate non-occlusive deep vein thrombosis involving the left subclavian vein, left axillary vein and left brachial veins.  - 1/14 chest CTA: neg for PE - 1/14 LE doppler: acute DVT involving the left subclavian vein, left axillary  vein, left brachial veins, left radial veins and left ulnar veins.  Today, 10/28/2019: - aPTT is sub-therapeutic at 63 sec;  heparin level is elevated at 0.87 but this is likely reflective of residual effect of Eliquis - hgb down 7.9, plts ok - no bleeding documented  Goal of Therapy:  Heparin level 0.3-0.7 units/ml aPTT 66-102 seconds Monitor platelets by anticoagulation protocol: Yes   Plan:  - increase heparin drip to 1150 units/hr - check 6 hr heparin level and aPTT - will monitor and adjust heparin rate based on aPTT for now until heparin and aPTT levels correlate, then dose based on heparin level alone - monitor for s/s bleeding  Lenee Franze P 10/28/2019,7:50 AM ________________________________ Adden: After rate increased to 1150 units/hr and checking levels--> aPTT is low with 59 sec. Heparin level 0.89 - increase heparin drip to 1250 units/hr - check 6 hr aPTT and heparin level  Dia Sitter, PharmD, BCPS 10/28/2019 2:59 PM

## 2019-10-28 NOTE — Progress Notes (Signed)
PROGRESS NOTE  Emary Fragoso [id number] DOB: 1965-10-25 DOA: 10/25/2019 PCP: Hoyt Koch, MD  HPI/Recap of past 24 hours: Patient is a 54 year old female with history of hyperlipidemia, no much medical issues who suffered some minor injury on her left shoulder 3 months ago and had been having left arm pain since then.  Patient continued to have pain that prompted an upper extremity duplex that showed DVT on 1/6.  Patient was initiated on Eliquis.  Patient continued to have worsening pain on her hands, she was unable to lift up her wrist so came to emergency room last night.  In the emergency room, she had obvious swelling of the left upper extremity, hemodynamically stable.  95% on room air.  Hemoglobin 9.9.  Covid negative.  CT angiogram of the chest showed no evidence of PE, groundglass opacities right upper lobe, conglomerate of lymphadenopathy on the left axilla, mediastinal lymphadenopathy sclerotic and lytic lesions throughout the skeleton as well as soft tissue mass on the left breast. Repeat duplex consistent with acute DVT left subclavian, left axillary, left brachial and left radial and left ulnar veins.  No evidence of compartment syndrome, distal arterial supply intact.    We will continue IV heparin over the weekend.  We will switch to subcu Lovenox prior to DC.  Curb sided with oncology Dr. Alvy Bimler 1/16.  We will follow up with oncology on Monday.  Will obtain biopsies outpatient.  10/28/19: Seen and examined.  Left upper extremity still significantly edematous but improved on heparin drip.  No swelling of the face or protrusion of veins on her chest.  Assessment/Plan: Principal Problem:   Acute deep vein thrombosis (DVT) of axillary vein of left upper extremity (HCC) Active Problems:   Community acquired pneumonia   Radial nerve palsy   Breast mass, left   DVT (deep vein thrombosis) in pregnancy   Hypokalemia  Left upper extremity DVT of multiple veins suspect in  the setting of malignancy: Was on Eliquis.  Developed new blood clots on Eliquis. Significant LUE swelling and discomfort.  Continue heparin drip over the weekend. Continue pain management PRN Will follow-up with oncology on Monday ` Presumed metastatic breast cancer: Clinical and radiological evidence consistent with metastatic breast cancer.  Probably with brachial plexus compression.  Significant left upper extremity swelling. We will follow up with oncology on Monday. Curb sided and discussed with oncology Dr. Alvy Bimler 1/16 Biopsy will be done outpatient Continue pain management  We will switch to full dose Lovenox at discharge  She never had any screening mammogram.  She never had any screening colonoscopy. Will need to complete outpatient.  Resolved hypokalemia: Replaced.  Community-acquired pneumonia: Suspected.  Cultures negative so far.  On Rocephin and azithromycin.  Obtain procalcitonin and lactic acid.  If negative, dc abx.    DVT prophylaxis: Heparin infusion Code Status: Full code Family Communication: None Disposition Plan:  Patient is currently not appropriate for discharge at this time due to ongoing significant left upper extremity pain and swelling. Will plan to dc once LUE swelling and pain have improved and oncology has signed off.   Consultants:   Oncology  Procedures:   None     Objective: Vitals:   10/27/19 0607 10/27/19 1448 10/27/19 2116 10/28/19 0637  BP: 102/69 102/69 111/74 92/61  Pulse: 95 (!) 102 (!) 102 92  Resp: 16 20 16 16   Temp: 98.7 F (37.1 C) 98.1 F (36.7 C) 98.9 F (37.2 C) 98.3 F (36.8 C)  TempSrc:  Oral  Oral Oral  SpO2: 96% 100% 99% 97%  Weight:      Height:        Intake/Output Summary (Last 24 hours) at 10/28/2019 1445 Last data filed at 10/27/2019 1800 Gross per 24 hour  Intake 487.1 ml  Output --  Net 487.1 ml   Filed Weights   10/26/19 0858  Weight: 72.8 kg    Exam:  . General: 54 y.o. year-old  female well developed well nourished in no acute distress.  Alert and oriented times 3. . Cardiovascular: Regular rate and rhythm no rubs or gallops.   Marland Kitchen Respiratory: Clear to auscultation with no wheezes or rales.  Good inspiratory effort. . Abdomen: Soft nontender normal bowel sounds present.   . Musculoskeletal: Left upper extremity significantly edematous with tenderness on palpation from shoulder to fingers.   Psychiatry: Pleasant, mood is appropriate for condition and setting.  Data Reviewed: CBC: Recent Labs  Lab 10/25/19 1345 10/25/19 2340 10/26/19 0345 10/27/19 0558 10/28/19 0518  WBC 9.1 8.3 8.4 8.1 7.1  NEUTROABS 5.8  --   --   --   --   HGB 9.9* 8.4* 8.4* 8.2* 7.9*  HCT 32.7* 28.8* 28.2* 27.8* 27.3*  MCV 91.6 91.4 91.3 91.4 92.9  PLT 294 287 289 266 [id number]   Basic Metabolic Panel: Recent Labs  Lab 10/25/19 1345 10/25/19 2340 10/26/19 0345 10/28/19 0615  NA 139  --  139 141  K 3.3*  --  3.6 3.6  CL 98  --  100 105  CO2 27  --  27 23  GLUCOSE 124*  --  103* 91  BUN 16  --  15 11  CREATININE 0.80 0.73 0.72 0.70  CALCIUM 10.0  --  9.3 9.3  MG  --   --   --  2.0  PHOS  --   --   --  4.7*   GFR: Estimated Creatinine Clearance: 74.2 mL/min (by C-G formula based on SCr of 0.7 mg/dL). Liver Function Tests: Recent Labs  Lab 10/25/19 1345 10/26/19 0345 10/28/19 0615  AST 47* 41 31  ALT 74* 61* 49*  ALKPHOS 364* 307* 249*  BILITOT 0.3 0.5 0.8  PROT 8.0 7.0 6.7  ALBUMIN 3.7 3.3* 3.1*   No results for input(s): LIPASE, AMYLASE in the last 168 hours. No results for input(s): AMMONIA in the last 168 hours. Coagulation Profile: No results for input(s): INR, PROTIME in the last 168 hours. Cardiac Enzymes: No results for input(s): CKTOTAL, CKMB, CKMBINDEX, TROPONINI in the last 168 hours. BNP (last 3 results) No results for input(s): PROBNP in the last 8760 hours. HbA1C: No results for input(s): HGBA1C in the last 72 hours. CBG: No results for input(s): GLUCAP  in the last 168 hours. Lipid Profile: No results for input(s): CHOL, HDL, LDLCALC, TRIG, CHOLHDL, LDLDIRECT in the last 72 hours. Thyroid Function Tests: No results for input(s): TSH, T4TOTAL, FREET4, T3FREE, THYROIDAB in the last 72 hours. Anemia Panel: No results for input(s): VITAMINB12, FOLATE, FERRITIN, TIBC, IRON, RETICCTPCT in the last 72 hours. Urine analysis:    Component Value Date/Time   COLORURINE LT. YELLOW 08/04/2011 0805   APPEARANCEUR Cloudy 08/04/2011 0805   LABSPEC 1.020 08/04/2011 0805   PHURINE 6.0 08/04/2011 0805   GLUCOSEU NEGATIVE 08/04/2011 0805   HGBUR LARGE 08/04/2011 0805   BILIRUBINUR NEGATIVE 08/04/2011 0805   KETONESUR NEGATIVE 08/04/2011 0805   UROBILINOGEN 0.2 08/04/2011 0805   NITRITE NEGATIVE 08/04/2011 0805   LEUKOCYTESUR NEGATIVE 08/04/2011 0805   Sepsis  Labs: @LABRCNTIP (procalcitonin:4,lacticidven:4)  ) Recent Results (from the past 240 hour(s))  SARS CORONAVIRUS 2 (TAT 6-24 HRS) Nasopharyngeal Nasopharyngeal Swab     Status: None   Collection Time: 10/25/19  8:48 PM   Specimen: Nasopharyngeal Swab  Result Value Ref Range Status   SARS Coronavirus 2 NEGATIVE NEGATIVE Final    Comment: (NOTE) SARS-CoV-2 target nucleic acids are NOT DETECTED. The SARS-CoV-2 RNA is generally detectable in upper and lower respiratory specimens during the acute phase of infection. Negative results do not preclude SARS-CoV-2 infection, do not rule out co-infections with other pathogens, and should not be used as the sole basis for treatment or other patient management decisions. Negative results must be combined with clinical observations, patient history, and epidemiological information. The expected result is Negative. Fact Sheet for Patients: SugarRoll.be Fact Sheet for Healthcare Providers: https://www.woods-mathews.com/ This test is not yet approved or cleared by the [place] FDA and  has been authorized  for detection and/or diagnosis of SARS-CoV-2 by FDA under an Emergency Use Authorization (EUA). This EUA will remain  in effect (meaning this test can be used) for the duration of the COVID-19 declaration under Section 56 4(b)(1) of the Act, 21 U.S.C. section 360bbb-3(b)(1), unless the authorization is terminated or revoked sooner. Performed at [facility], [place] [address] [place], [place] [address]       Studies: No results found.  Scheduled Meds: . azithromycin  500 mg Oral QHS    Continuous Infusions: . sodium chloride 50 mL/hr at 10/27/19 2154  . cefTRIAXone (ROCEPHIN)  IV 1 g (10/27/19 2115)  . heparin 1,150 Units/hr (10/28/19 0800)     LOS: 3 days     Kayleen Memos, MD [facility] Pager [phone]  If 7PM-7AM, please contact night-coverage [email or web address] Password [facility] 10/28/2019, 2:45 PM

## 2019-10-29 ENCOUNTER — Inpatient Hospital Stay (HOSPITAL_COMMUNITY): Payer: Managed Care, Other (non HMO)

## 2019-10-29 ENCOUNTER — Other Ambulatory Visit: Payer: Self-pay

## 2019-10-29 HISTORY — PX: IR US GUIDE BX ASP/DRAIN: IMG2392

## 2019-10-29 LAB — CBC WITH DIFFERENTIAL/PLATELET
Abs Immature Granulocytes: 0.56 10*3/uL — ABNORMAL HIGH (ref 0.00–0.07)
Basophils Absolute: 0.1 10*3/uL (ref 0.0–0.1)
Basophils Relative: 1 %
Eosinophils Absolute: 0.6 10*3/uL — ABNORMAL HIGH (ref 0.0–0.5)
Eosinophils Relative: 7 %
HCT: 28.8 % — ABNORMAL LOW (ref 36.0–46.0)
Hemoglobin: 8.5 g/dL — ABNORMAL LOW (ref 12.0–15.0)
Immature Granulocytes: 6 %
Lymphocytes Relative: 27 %
Lymphs Abs: 2.5 10*3/uL (ref 0.7–4.0)
MCH: 27.3 pg (ref 26.0–34.0)
MCHC: 29.5 g/dL — ABNORMAL LOW (ref 30.0–36.0)
MCV: 92.6 fL (ref 80.0–100.0)
Monocytes Absolute: 1 10*3/uL (ref 0.1–1.0)
Monocytes Relative: 11 %
Neutro Abs: 4.5 10*3/uL (ref 1.7–7.7)
Neutrophils Relative %: 48 %
Platelets: 271 10*3/uL (ref 150–400)
RBC: 3.11 MIL/uL — ABNORMAL LOW (ref 3.87–5.11)
RDW: 15 % (ref 11.5–15.5)
WBC: 9.2 10*3/uL (ref 4.0–10.5)
nRBC: 3.8 % — ABNORMAL HIGH (ref 0.0–0.2)

## 2019-10-29 LAB — APTT: aPTT: 109 seconds — ABNORMAL HIGH (ref 24–36)

## 2019-10-29 LAB — HEPARIN LEVEL (UNFRACTIONATED): Heparin Unfractionated: 1.07 IU/mL — ABNORMAL HIGH (ref 0.30–0.70)

## 2019-10-29 LAB — PROTIME-INR
INR: 1.1 (ref 0.8–1.2)
Prothrombin Time: 14.4 seconds (ref 11.4–15.2)

## 2019-10-29 IMAGING — US IR US GUIDANCE
1 series · 5 of 5 positions shown · non-contrast
Comparison: none

CLINICAL DATA: Left-sided breast mass, left axillary
lymphadenopathy, chest wall mass and mass within the left lobe of
the liver. The patient presents for liver biopsy.

[Series 1: ir us guidance · 5 of 5 slices shown]
[im 1/5]
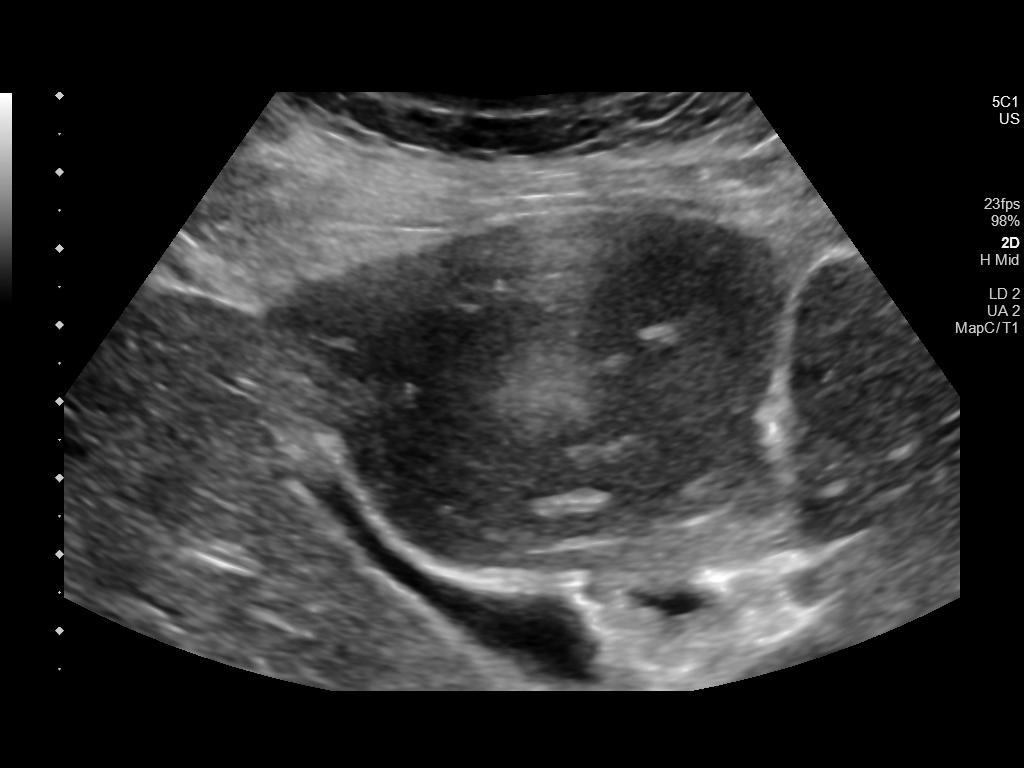
[im 2/5]
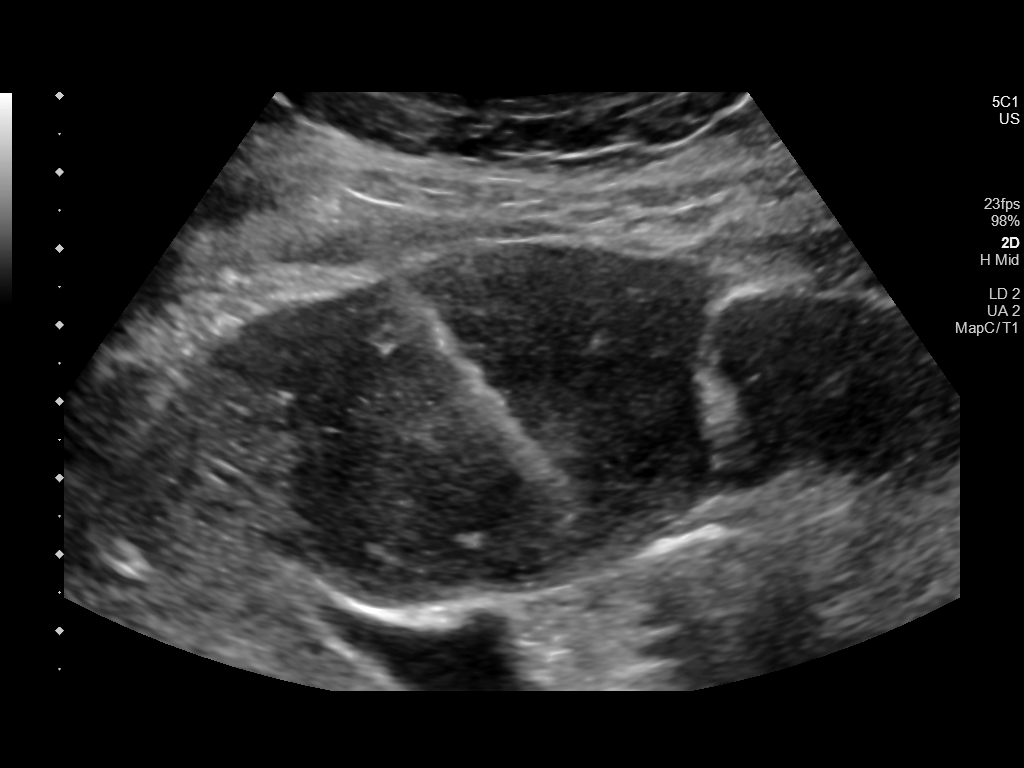
[im 3/5]
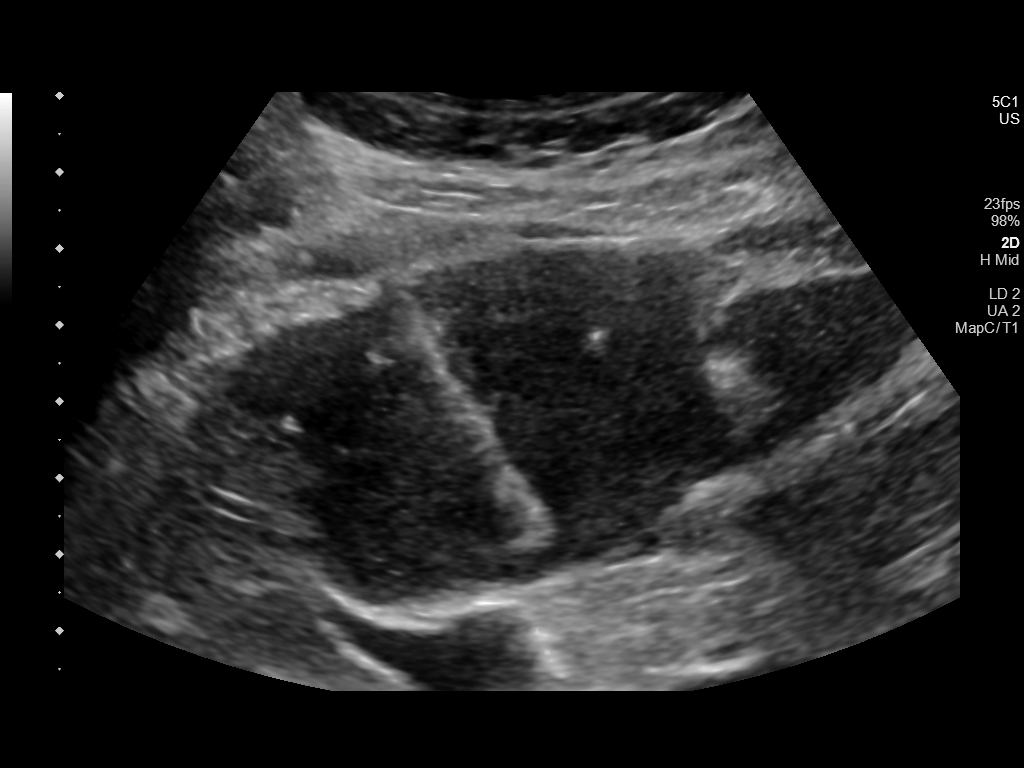
[im 4/5]
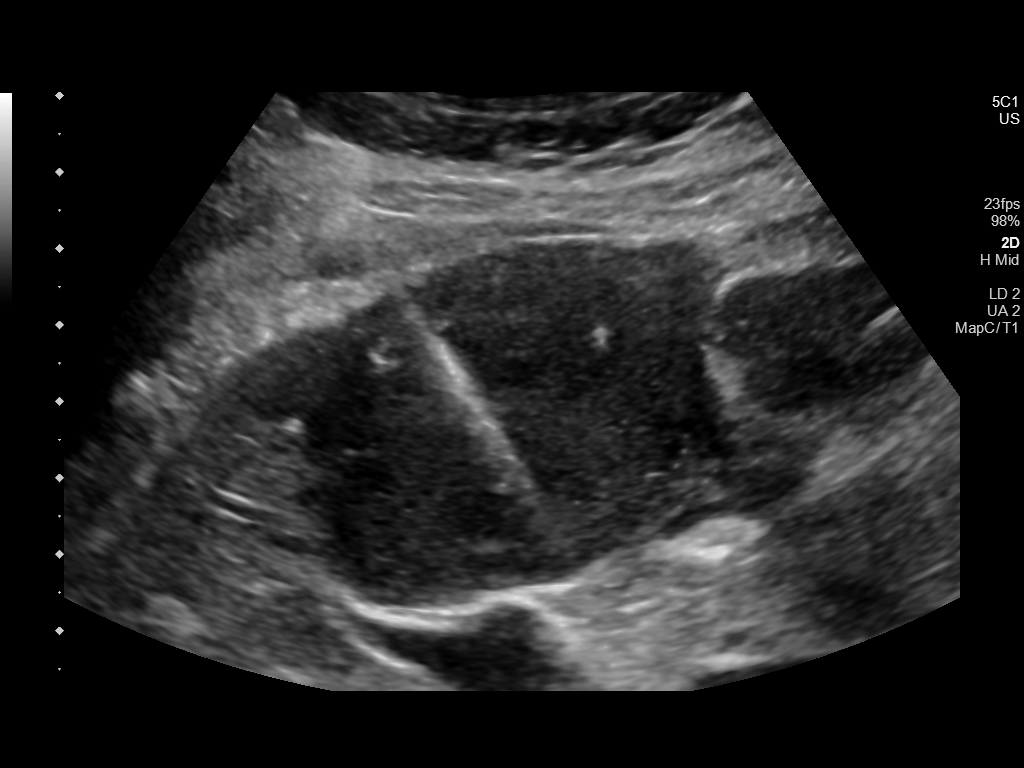
[im 5/5]
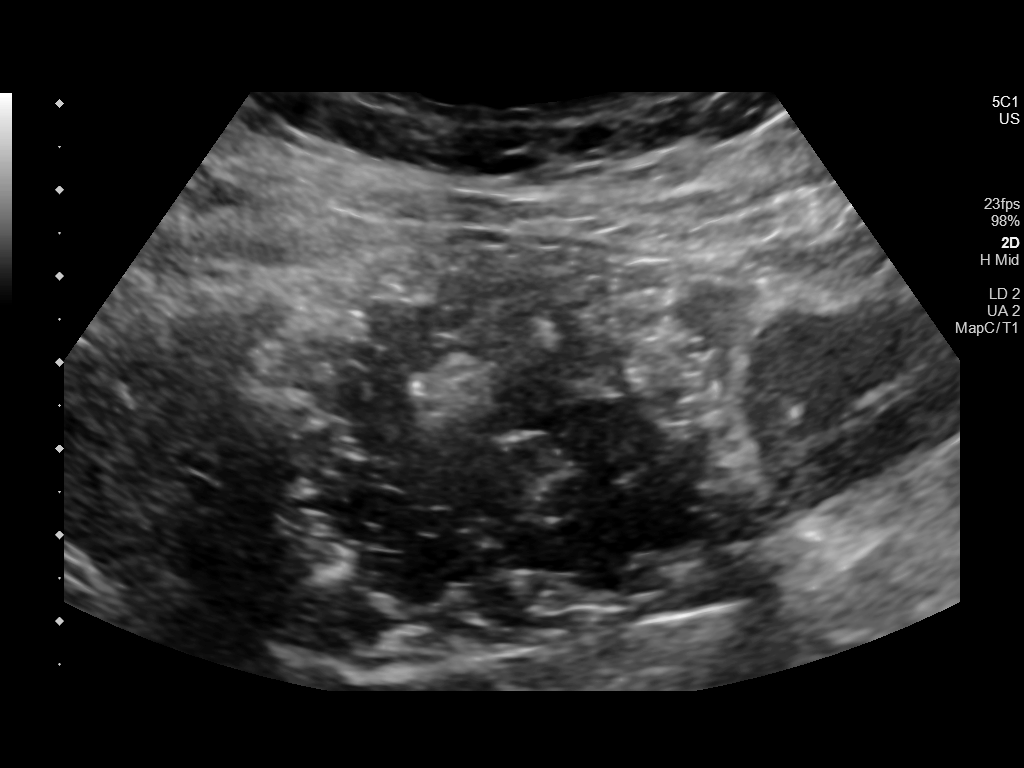

[5 of 5 positions shown; findings below may reference images not displayed]

EXAM:
ULTRASOUND GUIDED CORE BIOPSY OF LIVER

MEDICATIONS:
4.0 mg IV Versed; 100 mcg IV Fentanyl

Total Moderate Sedation Time: 33

The patient's level of consciousness and physiologic status were
continuously monitored during the procedure by Radiology nursing.

PROCEDURE:
The procedure, risks, benefits, and alternatives were explained to
the patient. Questions regarding the procedure were encouraged and
answered. The patient understands and consents to the procedure. A
time out was performed prior to initiating the procedure.

Ultrasound was performed to localize the liver and a lesion in the
left lobe. The abdominal wall was prepped with chlorhexidine in a
sterile fashion, and a sterile drape was applied covering the
operative field. A sterile gown and sterile gloves were used for the
procedure. Local anesthesia was provided with 1% Lidocaine.

Under ultrasound guidance, a 17 gauge needle was advanced into the
left lobe of the liver. Three separate coaxial 18 gauge core biopsy
samples were obtained. Samples were submitted in formalin. Gel-Foam
pledgets were advanced through the outer needle as the needle was
retracted and removed.

COMPLICATIONS:
None.
FINDINGS: Relatively hypoechoic mass within the left lobe of the liver is
ill-defined but localized by ultrasound and corresponds to the CT
abnormality seen previously. This region measures approximately 5 cm
in diameter by ultrasound. Solid tissue was obtained.
IMPRESSION: Ultrasound-guided core biopsy performed of a lesion within the left
lobe of the liver measuring approximately 5 cm in diameter by
ultrasound.

## 2019-10-29 IMAGING — US IR US GUIDANCE
1 series · 5 of 5 positions shown · non-contrast
Comparison: none

CLINICAL DATA: Left-sided breast mass, left axillary
lymphadenopathy, chest wall mass and mass within the left lobe of
the liver. The patient presents for liver biopsy.

[Series 1: (id) · 5 of 5 slices shown]
[im 1/5]
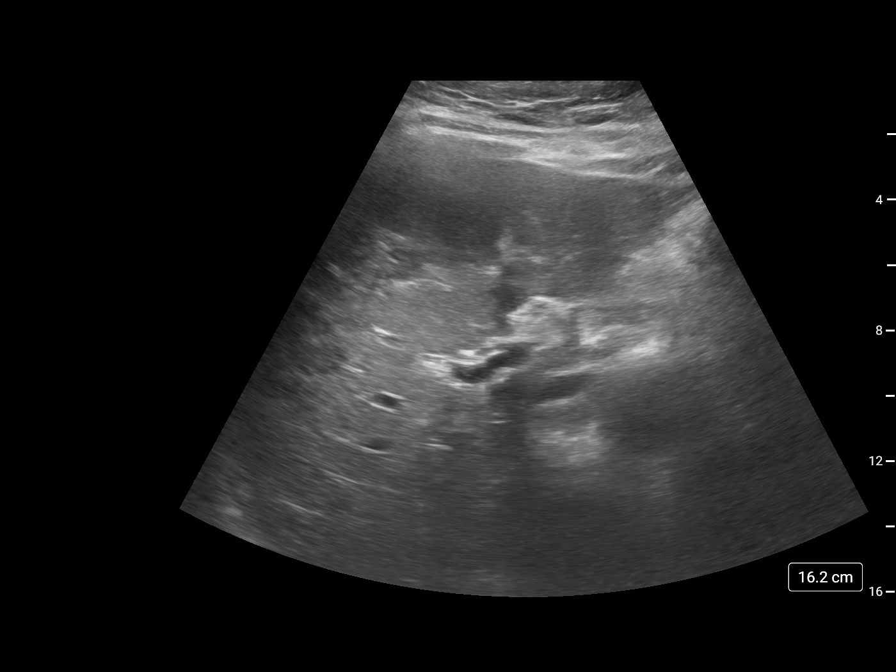
[im 2/5]
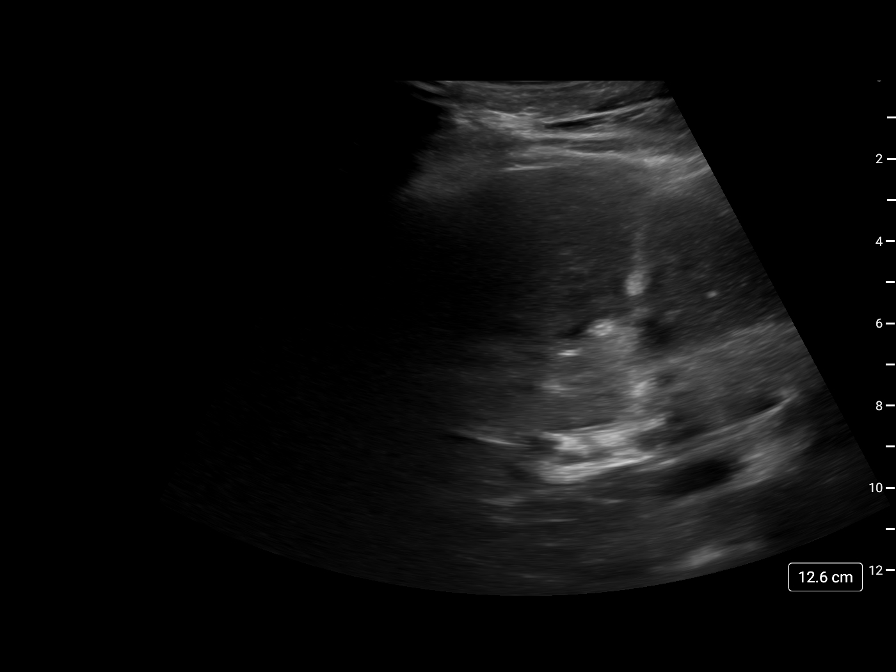
[im 3/5]
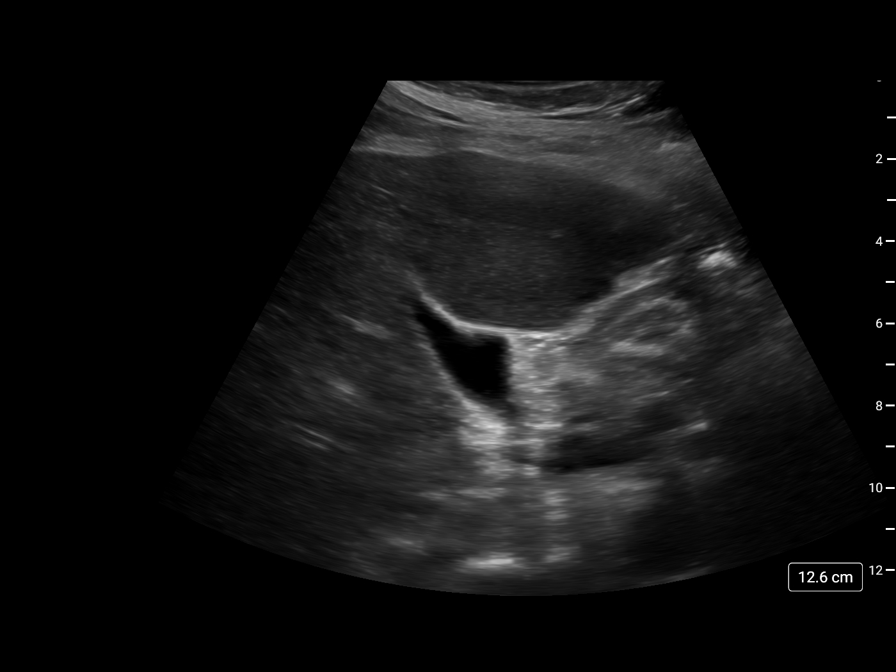
[im 4/5]
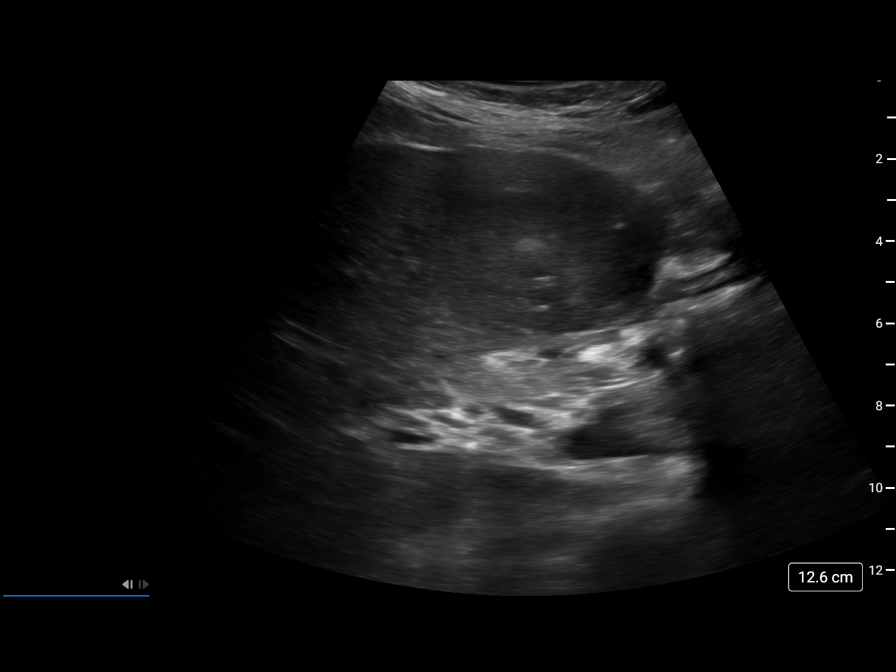
[im 5/5]
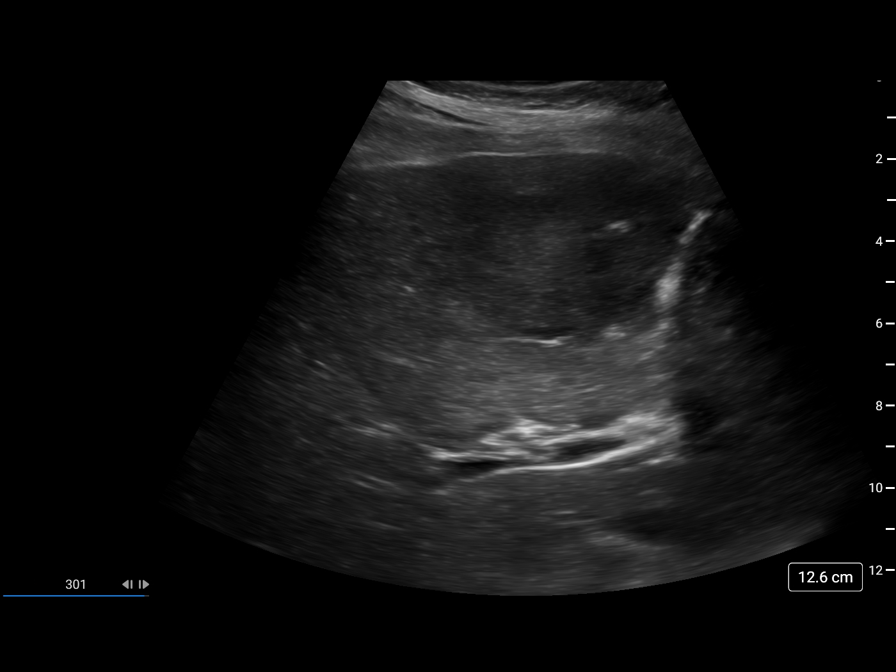

[5 of 5 positions shown; findings below may reference images not displayed]

EXAM:
ULTRASOUND GUIDED CORE BIOPSY OF LIVER

MEDICATIONS:
4.0 mg IV Versed; 100 mcg IV Fentanyl

Total Moderate Sedation Time: 33

The patient's level of consciousness and physiologic status were
continuously monitored during the procedure by Radiology nursing.

PROCEDURE:
The procedure, risks, benefits, and alternatives were explained to
the patient. Questions regarding the procedure were encouraged and
answered. The patient understands and consents to the procedure. A
time out was performed prior to initiating the procedure.

Ultrasound was performed to localize the liver and a lesion in the
left lobe. The abdominal wall was prepped with chlorhexidine in a
sterile fashion, and a sterile drape was applied covering the
operative field. A sterile gown and sterile gloves were used for the
procedure. Local anesthesia was provided with 1% Lidocaine.

Under ultrasound guidance, a 17 gauge needle was advanced into the
left lobe of the liver. Three separate coaxial 18 gauge core biopsy
samples were obtained. Samples were submitted in formalin. Gel-Foam
pledgets were advanced through the outer needle as the needle was
retracted and removed.

COMPLICATIONS:
None.
FINDINGS: Relatively hypoechoic mass within the left lobe of the liver is
ill-defined but localized by ultrasound and corresponds to the CT
abnormality seen previously. This region measures approximately 5 cm
in diameter by ultrasound. Solid tissue was obtained.
IMPRESSION: Ultrasound-guided core biopsy performed of a lesion within the left
lobe of the liver measuring approximately 5 cm in diameter by
ultrasound.

## 2019-10-29 MED ORDER — AZITHROMYCIN 250 MG PO TABS
500.0000 mg | ORAL_TABLET | Freq: Every day | ORAL | Status: DC
  Administered 2019-10-29: 21:00:00 500 mg via ORAL
  Filled 2019-10-29: qty 2

## 2019-10-29 MED ORDER — FENTANYL CITRATE (PF) 100 MCG/2ML IJ SOLN
INTRAMUSCULAR | Status: AC | PRN
  Administered 2019-10-29 (×2): 25 ug via INTRAVENOUS
  Administered 2019-10-29: 50 ug via INTRAVENOUS

## 2019-10-29 MED ORDER — FENTANYL CITRATE (PF) 100 MCG/2ML IJ SOLN
INTRAMUSCULAR | Status: AC
  Filled 2019-10-29: qty 2

## 2019-10-29 MED ORDER — MIDAZOLAM HCL 2 MG/2ML IJ SOLN
INTRAMUSCULAR | Status: AC
  Filled 2019-10-29: qty 4

## 2019-10-29 MED ORDER — GELATIN ABSORBABLE 12-7 MM EX MISC
CUTANEOUS | Status: AC
  Filled 2019-10-29: qty 1

## 2019-10-29 MED ORDER — HEPARIN (PORCINE) 25000 UT/250ML-% IV SOLN
1300.0000 [IU]/h | INTRAVENOUS | Status: AC
  Administered 2019-10-29 – 2019-10-30 (×2): 1300 [IU]/h via INTRAVENOUS
  Filled 2019-10-29 (×3): qty 250

## 2019-10-29 MED ORDER — HEPARIN (PORCINE) 25000 UT/250ML-% IV SOLN
1300.0000 [IU]/h | INTRAVENOUS | Status: AC
  Filled 2019-10-29: qty 250

## 2019-10-29 MED ORDER — LIDOCAINE HCL 1 % IJ SOLN
INTRAMUSCULAR | Status: AC
  Administered 2019-10-29: 17:00:00 7 mL
  Filled 2019-10-29: qty 20

## 2019-10-29 MED ORDER — MIDAZOLAM HCL 2 MG/2ML IJ SOLN
INTRAMUSCULAR | Status: AC | PRN
  Administered 2019-10-29 (×2): 0.5 mg via INTRAVENOUS
  Administered 2019-10-29: 1 mg via INTRAVENOUS
  Administered 2019-10-29: 0.5 mg via INTRAVENOUS
  Administered 2019-10-29: 1 mg via INTRAVENOUS

## 2019-10-29 NOTE — Progress Notes (Signed)
ANTICOAGULATION CONSULT NOTE - Follow Up Consult  Pharmacy Consult for Heparin Indication: Acute UE DVT  Allergies  Allergen Reactions  . Shellfish Allergy Itching and Swelling  . Sulfamethoxazole     REACTION: swelling    Patient Measurements: Height: 5\' 1"  (154.9 cm) Weight: 160 lb 9.6 oz (72.8 kg) IBW/kg (Calculated) : 47.8 Heparin Dosing Weight: 64 kg  Vital Signs: Temp: 97.9 F (36.6 C) (01/18 0627) Temp Source: Oral (01/18 0627) BP: 107/68 (01/18 0627) Pulse Rate: 97 (01/18 0627)  Labs: Recent Labs    10/27/19 0558 10/27/19 0558 10/28/19 0518 10/28/19 0518 10/28/19 0615 10/28/19 1420 10/28/19 2114 10/29/19 0738  HGB 8.2*  --  7.9*  --   --   --   --   --   HCT 27.8*  --  27.3*  --   --   --   --   --   PLT 266  --  285  --   --   --   --   --   APTT 71*   < > 63*   < >  --  59* 52* 109*  HEPARINUNFRC 1.08*   < > 0.87*   < >  --  0.89* 0.84* 1.07*  CREATININE  --   --   --   --  0.70  --   --   --    < > = values in this interval not displayed.    Estimated Creatinine Clearance: 74.2 mL/min (by C-G formula based on SCr of 0.7 mg/dL).   Medications:  Infusions:  . sodium chloride 50 mL/hr at 10/28/19 2019  . cefTRIAXone (ROCEPHIN)  IV 1 g (10/28/19 2020)  . heparin 1,450 Units/hr (10/28/19 2332)    Assessment: Patient with heparin level above goal and PTT above goal.  PTT ordered with daily Heparin level until both correlate due to possible drug-lab interaction between oral anticoagulant (rivaroxaban, edoxaban, or apixaban) and anti-Xa level (aka heparin level)  No heparin issues per RN.  Goal of Therapy:  Heparin level 0.3-0.7 units/ml aPTT 66-102 seconds Monitor platelets by anticoagulation protocol: Yes   Plan:  Decrease heparin infusion from 1450 units/hr to 1300 units/hr Recheck APTT at 1700 today Daily heparin level When heparin level at or below goal range and correlate with aptt then will use heparin levels only  Loretha Ure P  Legrand Como 10/29/2019,10:01 AM

## 2019-10-29 NOTE — Procedures (Signed)
Interventional Radiology Procedure Note  Procedure: US Guided Biopsy of liver  Complications: None  Estimated Blood Loss: < 10 mL  Findings: 18 G core biopsy of left lobe liver lesion performed under US guidance.  Three core samples obtained and sent to Pathology.  Collier Monica T. Percy Comp, M.D Pager:  [phone]    

## 2019-10-29 NOTE — Progress Notes (Signed)
Patient is still in yellow MEWS however pulse is starting to trend down! Q2h VS are complete - moving onto q4h VS starting at 2330.

## 2019-10-29 NOTE — Progress Notes (Signed)
Pharmacy Brief Note - Post-IR Procedure Consult:   Patient on heparin infusion for VTE treatment. Heparin was held for biopsy today. Pharmacy consulted to resume anticoagulation according to post-IT procedure protocol.   Procedure: US guided biopsy of the liver Bleed risk: Standard Timing of resuming heparin: 6 hours post procedure  Resume heparin infusion at previous rate of 1300 units/hr @ 2330 this evening. Check 6 hour aPTT.  Lenis Noon, PharmD 10/29/19 6:04 PM

## 2019-10-29 NOTE — Evaluation (Signed)
Physical Therapy Evaluation Patient Details Name: Abigail [name] MRN: [id number] DOB: Sep 27, 1966 Today's Date: 10/29/2019   History of Present Illness  Pt is a 54 year old woman with h/o HLD admitted 10/25/19 with worsening pain, weakness and edema of L UE. + for L UE DVT an suspect metastatic breast cancer.  Clinical Impression  Patient evaluated by Physical Therapy with no further acute PT needs identified. All education has been completed and the patient has no further questions.  Pt ambulating well and would recommend f/u with OP OT for L UE deficits. See below for any follow-up Physical Therapy or equipment needs. PT is signing off. Thank you for this referral.     Follow Up Recommendations No PT follow up(f/u with OT)    Equipment Recommendations  None recommended by PT    Recommendations for Other Services       Precautions / Restrictions        Mobility  Bed Mobility                  Transfers Overall transfer level: Modified independent                  Ambulation/Gait Ambulation/Gait assistance: Modified independent (Device/Increase time) Gait Distance (Feet): 400 Feet Assistive device: IV Pole;None Gait Pattern/deviations: WFL(Within Functional Limits)     General Gait Details: only deviation is no arm swing as pt keeping L UE stiff  Stairs            Wheelchair Mobility    Modified Rankin (Stroke Patients Only)       Balance Overall balance assessment: Modified Independent                                           Pertinent Vitals/Pain Pain Assessment: Faces Faces Pain Scale: Hurts a little bit Pain Location: L UE Pain Descriptors / Indicators: Throbbing Pain Intervention(s): Monitored during session;Repositioned    Home Living Family/patient expects to be discharged to:: Private residence Living Arrangements: Children(47 year old daughter) Available Help at Discharge: Family;Available 24 hours/day Type of  Home: House       Home Layout: Multi-level Home Equipment: None      Prior Function Level of Independence: Independent         Comments: works as an Counsellor   Dominant Hand: Right    Extremity/Trunk Assessment        Lower Extremity Assessment Lower Extremity Assessment: Overall WFL for tasks assessed    Cervical / Trunk Assessment Cervical / Trunk Assessment: Normal  Communication   Communication: HOH  Cognition Arousal/Alertness: Awake/alert Behavior During Therapy: WFL for tasks assessed/performed Overall Cognitive Status: Within Functional Limits for tasks assessed                                        General Comments      Exercises     Assessment/Plan    PT Assessment Patent does not need any further PT services  PT Problem List         PT Treatment Interventions      PT Goals (Current goals can be found in the Care Plan section)  Acute Rehab PT Goals PT Goal Formulation: All assessment and education complete, DC  therapy    Frequency     Barriers to discharge        Co-evaluation               AM-PAC PT "6 Clicks" Mobility  Outcome Measure Help needed turning from your back to your side while in a flat bed without using bedrails?: None Help needed moving from lying on your back to sitting on the side of a flat bed without using bedrails?: None Help needed moving to and from a bed to a chair (including a wheelchair)?: None Help needed standing up from a chair using your arms (e.g., wheelchair or bedside chair)?: None Help needed to walk in hospital room?: None Help needed climbing 3-5 steps with a railing? : None 6 Click Score: 24    End of Session   Activity Tolerance: Patient tolerated treatment well Patient left: in chair;with call bell/phone within reach        Time: 0953-1003 PT Time Calculation (min) (ACUTE ONLY): 10 min   Charges:   PT  Evaluation $PT Eval Low Complexity: 1 Low        Kati PT, DPT Acute Rehabilitation Services Office: [phone]  Trena Platt 10/29/2019, 12:34 PM

## 2019-10-29 NOTE — Progress Notes (Signed)
   Vital Signs MEWS/VS Documentation      10/29/2019 1706 10/29/2019 1711 10/29/2019 1711 10/29/2019 1738   MEWS Score:  3  3  3  1    MEWS Score Color:  Yellow  Yellow  Yellow  Green   Resp:  --  --  (!) 23  20   Pulse:  --  --  (!) 113  (!) 105   BP:  --  --  118/80  129/82   Temp:  --  --  --  98.9 F (37.2 C)   O2 Device:  --  --  Room Air  --   Level of Consciousness:  Alert  Alert  --  --      Pt vitals taken upon return to unit.   Pulse 105 BP 129/82 Temp 98.9 Resp 20 O2 100%  IR nurse notified MD that Yellow MEWS was triggered during procedure.  Pt VS improved. Will continue Yellow MEWS protocol with q2 VS at [place].  Patient resting comfortably at this time.   [place] 10/29/2019,6:01 PM

## 2019-10-29 NOTE — Sedation Documentation (Signed)
Patient denies pain and is resting comfortably.  

## 2019-10-29 NOTE — Consult Note (Signed)
Chief Complaint: Patient was seen in consultation today for image guided liver lesion biopsy Chief Complaint  Patient presents with  . Arm Swelling  . Arm Pain    Referring Physician(s): Curcio,K,NP/Hall,C  Supervising Physician: Aletta Edouard  Patient Status: [facility] - In-pt  History of Present Illness: Abigail [name] is a 54 y.o. female with with  past medical history significant for hyperlipidemia and allergic rhinitis who recently presented to the emergency room with left arm pain. She was subsequently  diagnosed with a left upper extremity DVT on 10/17/2019. She was started on Eliquis as an outpatient.  She was seen by her orthopedic surgeon on the day of admission due to worsening function in her left hand.  There was concern for radial nerve palsy and suspected compression nerve injury so it was recommended for the patient to present to the emergency room for further evaluation.  In the ER, she had a CT angiogram of the chest which did not show any evidence of PE but did show a rounded soft tissue mass in the left breast concerning for breast cancer, numerous left axillary lymph nodes, mild mediastinal adenopathy, and diffuse sclerotic and lytic lesions throughout the visualized skeleton compatible with osseous metastatic disease.  She was also noted to have a moderate left pleural effusion. CT A/P done 1/15 revealed segment 4B liver lesion. Request now received for image guided biopsy for further evaluation. She is currently on IV heparin.   Past Medical History:  Diagnosis Date  . Allergic rhinitis   . Hyperlipidemia    LDL 150's '11    Past Surgical History:  Procedure Laterality Date  . CESAREAN SECTION      Allergies: Shellfish allergy and Sulfamethoxazole  Medications: Prior to Admission medications   Medication Sig Start Date End Date Taking? Authorizing Provider  acetaminophen (TYLENOL) 500 MG tablet Take 500 mg by mouth every 6 (six) hours as needed for mild pain.    Yes [provider]  apixaban (ELIQUIS) 5 MG TABS tablet Take 1 tablet (5 mg total) by mouth 2 (two) times daily. 10/19/19  Yes Hoyt Koch, MD  diclofenac (VOLTAREN) 75 MG EC tablet Take 75 mg by mouth 2 (two) times daily. 10/25/19   [provider]  gabapentin (NEURONTIN) 300 MG capsule One tab PO qHS for a week, then BID for a week, then TID. May double weekly to a max of 3,600mg /day Patient not taking: Reported on 10/25/2019 09/17/19   Gregor Hams, MD  HYDROcodone-acetaminophen (NORCO/VICODIN) 5-325 MG tablet Take 1-2 tablets by mouth every 6 (six) hours as needed. Patient not taking: Reported on 10/25/2019 10/14/19   Davonna Belling, MD  methocarbamol (ROBAXIN) 500 MG tablet Take 500 mg by mouth 4 (four) times daily. 10/18/19   [provider]     Family History  Problem Relation Age of Onset  . Diabetes Mother   . Hypertension Mother   . Hypertension Father   . Hypertension Sister   . Heart disease Sister        MI - no serious damage to heart  . Cancer Neg Hx     Social History   Socioeconomic History  . Marital status: Single    Spouse name: Not on file  . Number of children: 3  . Years of education: 11  . Highest education level: Not on file  Occupational History  . Occupation: Armed forces training and education officer  Tobacco Use  . Smoking status: Never Smoker  . Smokeless tobacco: Never Used  Substance and Sexual Activity  . Alcohol use: Yes    Comment: rare use - twice rare  . Drug use: No  . Sexual activity: Yes    Partners: Male  Other Topics Concern  . Not on file  Social History Narrative   HSG, [place]. Married '04, 1 dtr - '05, raising niece and nephew. Work - Chief Strategy Officer. Marriage in good. No history of abuse. Working on [place] education at [employer] (oct '12)   Social Determinants of [place]ads Strain:   . Difficulty of Paying Living Expenses: Not on file  Food Insecurity:   . Worried About  Charity fundraiser in the Last Year: Not on file  . Ran Out of Food in the Last Year: Not on file  Transportation Needs:   . Lack of Transportation (Medical): Not on file  . Lack of Transportation (Non-Medical): Not on file  Physical Activity:   . Days of Exercise per Week: Not on file  . Minutes of Exercise per Session: Not on file  Stress:   . Feeling of Stress : Not on file  Social Connections:   . Frequency of Communication with Friends and Family: Not on file  . Frequency of Social Gatherings with Friends and Family: Not on file  . Attends Religious Services: Not on file  . Active Member of Clubs or Organizations: Not on file  . Attends Archivist Meetings: Not on file  . Marital Status: Not on file      Review of Systems currently denies fever, ,HA,CP, worsening dyspnea, cough, abd pain, N/V or bleeding. She does have some back pain.  Vital Signs: BP 107/68 (BP Location: Right Arm)   Pulse 97   Temp 97.9 F (36.6 C) (Oral)   Resp 16   Ht 5\' 1"  (1.549 m)   Wt 160 lb 9.6 oz (72.8 kg)   SpO2 97%   BMI 30.35 kg/m   Physical Exam awake/alert; chest- sl dim BS left base, right clear; heart- tachy but regular; abd- soft,+BS,NT; no LE edema  Imaging: DG Chest 2 View  Result Date: 10/25/2019 CLINICAL DATA:  Left upper extremity region pain and edema EXAM: CHEST - 2 VIEW COMPARISON:  None. FINDINGS: There is atelectatic change in the left base with small left pleural effusion. There is subtle increased opacity in the right upper lobe which may represent earliest changes of pneumonia. Lungs elsewhere clear. Heart size and pulmonary vascularity are normal. No adenopathy. No bone lesions. No pneumothorax. There is soft tissue swelling in the visualized left upper extremity. IMPRESSION: 1.  Small left pleural effusion with left base atelectasis. 2. Suspect earliest changes of pneumonia right upper lobe. Question atypical organism pneumonia. 3.  Cardiac silhouette normal. 4.   No adenopathy. 5.  Soft tissue swelling of visualized left upper extremity. Electronically Signed   By: Lowella Grip III M.D.   On: 10/25/2019 14:17   CT Angio Chest PE W/Cm &/Or Wo Cm  Result Date: 10/25/2019 CLINICAL DATA:  Swelling and pain in left upper extremity with blood clot. Left upper back pain. EXAM: CT ANGIOGRAPHY CHEST WITH CONTRAST TECHNIQUE: Multidetector CT imaging of the chest was performed using the standard protocol during bolus administration of intravenous contrast. Multiplanar CT image reconstructions and MIPs were obtained to evaluate the vascular anatomy. CONTRAST:  67mL OMNIPAQUE IOHEXOL 350 MG/ML SOLN COMPARISON:  None. FINDINGS: Cardiovascular: No filling defects in the pulmonary arteries to suggest pulmonary emboli. The given history  of a left axillary vein thrombus cannot be visualized on this study. Heart is normal size. Aorta is normal caliber. Mediastinum/Nodes: There is abnormal soft tissue in the left axilla measuring 4.1 x 2.3 cm on image 30. This is presumably conglomerate nodal mass. Borderline sized mediastinal lymph nodes. Prevascular lymph node measures 11 mm. Enlarged right paratracheal lymph node measures 15 mm. Lungs/Pleura: Moderate-sized left pleural effusion. Atelectasis in the left lower lobe. Ground-glass airspace opacity in the posterior right upper lobe could reflect early infiltrate. Scattered small nodules within the lungs. 3 mm nodule in the right upper lobe on image 41. 3 mm nodule in the left upper lobe on image 47. Clustered nodules seen posteriorly in the superior segment of the right lower lobe. Upper Abdomen: Imaging into the upper abdomen shows no acute findings. Musculoskeletal: Rounded masslike density seen in the left breast measuring 4.2 x 2.9 cm. Recommend correlation with mammography. Mixed lytic and sclerotic lesions diffusely throughout the visualized spine as well as turn Um and manubrium, left scapula, bilateral humeral heads and  multiple bilateral ribs compatible with osseous metastatic disease. Review of the MIP images confirms the above findings. IMPRESSION: No evidence of pulmonary embolus. Rounded soft tissue mass in the left breast. Cannot exclude breast cancer given the below findings. Recommend correlation with mammography. Numerous left axillary lymph nodes with conglomerate nodal mass. There is abnormal appearance of the left latissimus Dorsey and muscles surrounding the left scapula concerning for mass/tumor involvement. Mild mediastinal adenopathy. Diffuse sclerotic and lytic lesions throughout the visualized skeleton compatible with osseous metastatic disease. Ground-glass airspace opacity in the superior segment of the right lower lobe with clustered nodular densities. Similar airspace disease posteriorly in the right upper lobe. Cannot exclude areas of early pneumonia. Moderate left pleural effusion. Compressive atelectasis in the left lower lobe. These results were called by telephone at the time of interpretation on 10/25/2019 at 7:19 pm to provider MARGAUX VENTER , who verbally acknowledged these results. Electronically Signed   By: Rolm Baptise M.D.   On: 10/25/2019 19:19   CT ABDOMEN PELVIS W CONTRAST  Result Date: 10/26/2019 CLINICAL DATA:  Followup chest CT demonstrating metastatic disease and left breast mass EXAM: CT ABDOMEN AND PELVIS WITH CONTRAST TECHNIQUE: Multidetector CT imaging of the abdomen and pelvis was performed using the standard protocol following bolus administration of intravenous contrast. CONTRAST:  135mL OMNIPAQUE IOHEXOL 300 MG/ML  SOLN COMPARISON:  Chest CT from yesterday. FINDINGS: Lower chest: 4 cm enhancing left breast mass with extensive lymphadenopathy involving the left axilla and tumor involving the left chest wall including the latissimus dorsi and serratus anterior muscles. Left pleural effusion with overlying atelectasis and pulmonary nodules suspicious for pulmonary metastatic  disease. Hepatobiliary: Large ill-defined lesion in segment 4 B of the liver worrisome for a metastatic focus. I do not see any other definite liver lesions. The gallbladder is unremarkable. No common bile duct dilatation. The portal and hepatic veins are patent. Pancreas: No mass, inflammation or ductal dilatation. Spleen: Normal size.  No focal lesions. Adrenals/Urinary Tract: The adrenal glands and kidneys are unremarkable. The bladder is normal. Stomach/Bowel: The stomach, duodenum, small bowel and colon are unremarkable. No acute inflammatory changes, mass lesions or obstructive findings. The terminal ileum is normal. The appendix is normal. Moderate stool noted in the mildly distended rectum. Vascular/Lymphatic: The aorta and branch vessels are patent. The major venous structures are patent. No mesenteric or retroperitoneal mass or adenopathy. No pelvic adenopathy. Reproductive: Mildly enlarged fibroid uterus noted. The ovaries  appear normal. Other: No free pelvic fluid collections. No inguinal mass or adenopathy. Musculoskeletal: Diffuse osseous metastatic disease with mixed lytic and sclerotic lesions throughout the spine, ribs and pelvis and both hips. No pathologic fracture and no spinal canal compromise. IMPRESSION: 1. Ill-defined 3.5 cm lesion in segment 4B of the liver, likely a metastatic focus. 2. No other abdominal/pelvic metastatic disease identified. 3. Diffuse osseous metastatic disease with mixed lytic and sclerotic process. No pathologic fracture or spinal canal compromise. 4. Left pleural effusion and small metastatic pulmonary nodules. 5. Extensive left breast and chest wall disease as better seen on recent chest CT. Electronically Signed   By: Marijo Sanes M.D.   On: 10/26/2019 17:01   UE VENOUS DUPLEX ([facility] & [facility] 7 am - 7 pm)  Result Date: 10/26/2019 UPPER VENOUS STUDY  Indications: Swelling Anticoagulation: Eliquis. Limitations: Body habitus, poor ultrasound/tissue interface and patient  pain tolerance. Comparison Study: 10/17/2019-age indeterminate DVT left upper extremity Performing Technologist: Oliver Hum RVT  Examination Guidelines: A complete evaluation includes B-mode imaging, spectral Doppler, color Doppler, and power Doppler as needed of all accessible portions of each vessel. Bilateral testing is considered an integral part of a complete examination. Limited examinations for reoccurring indications may be performed as noted.  Right Findings: +----------+------------+---------+-----------+----------+-------+ RIGHT     CompressiblePhasicitySpontaneousPropertiesSummary +----------+------------+---------+-----------+----------+-------+ Subclavian    Full       Yes       Yes                      +----------+------------+---------+-----------+----------+-------+  Left Findings: +----------+------------+---------+-----------+----------+--------------+ LEFT      CompressiblePhasicitySpontaneousProperties   Summary     +----------+------------+---------+-----------+----------+--------------+ IJV           Full       Yes       Yes                             +----------+------------+---------+-----------+----------+--------------+ Subclavian    None       No        No                   Acute      +----------+------------+---------+-----------+----------+--------------+ Axillary      None       No        No                   Acute      +----------+------------+---------+-----------+----------+--------------+ Brachial      None                 No                   Acute      +----------+------------+---------+-----------+----------+--------------+ Radial        None                                      Acute      +----------+------------+---------+-----------+----------+--------------+ Ulnar         None                                      Acute      +----------+------------+---------+-----------+----------+--------------+ Cephalic       Full                                                 +----------+------------+---------+-----------+----------+--------------+  Basilic                                             Not visualized +----------+------------+---------+-----------+----------+--------------+  Summary:  Right: No evidence of thrombosis in the subclavian.  Left: No evidence of superficial vein thrombosis in the upper extremity. Findings consistent with acute deep vein thrombosis involving the left subclavian vein, left axillary vein, left brachial veins, left radial veins and left ulnar veins. Unable to visualize basilic vein.  *See table(s) above for measurements and observations.  Diagnosing physician: Monica Martinez MD Electronically signed by Monica Martinez MD on 10/26/2019 at 3:53:02 PM.    Final    VAS [place] UPPER EXTREMITY VENOUS DUPLEX  Result Date: 10/17/2019 UPPER VENOUS STUDY  Indications: Edema, and intermittent left arm pain and swelling since end of 06/2019. Patient denies any trauma, SOB or chest pain. Risk Factors: None identified Patient had difficulty with keeping arm bent or straight during exam due to pain. Anticoagulation: None. Comparison Study: None Performing Technologist: Alecia Mackin RVT, RDCS (AE), RDMS  Examination Guidelines: A complete evaluation includes B-mode imaging, spectral Doppler, color Doppler, and power Doppler as needed of all accessible portions of each vessel. Bilateral testing is considered an integral part of a complete examination. Limited examinations for reoccurring indications may be performed as noted.  Left Findings: +----------+------------+---------+-----------+---------------+----------------+ LEFT      CompressiblePhasicitySpontaneous  Properties       Summary      +----------+------------+---------+-----------+---------------+----------------+ IJV           Full       Yes       Yes                                     +----------+------------+---------+-----------+---------------+----------------+ Subclavian               No        No                     vein not well                                                            seen proximal to                                                             mid chest.    +----------+------------+---------+-----------+---------------+----------------+ Axillary    Partial      No        No          rigid           Age                                                   w/compression  Indeterminate   +----------+------------+---------+-----------+---------------+----------------+ Brachial    Partial      No        No         dilated          Age                                                                  Indeterminate   +----------+------------+---------+-----------+---------------+----------------+ Radial        Full       Yes       Yes                                    +----------+------------+---------+-----------+---------------+----------------+ Ulnar         Full       Yes       Yes                                    +----------+------------+---------+-----------+---------------+----------------+ Cephalic      Full       Yes       Yes                                    +----------+------------+---------+-----------+---------------+----------------+ Basilic       Full       Yes       Yes                                    +----------+------------+---------+-----------+---------------+----------------+ Collaterals present along course of brachial veins. The medial paired brachial vein appears almost occluded from axiallry down to antecubital fossa. Findings reported to Dr. Netta Cedars at 11:45 am.  Summary:  Left: No evidence of superficial vein thrombosis in the upper extremity. Findings consistent with age indeterminate non-occlusive deep vein thrombosis involving the left subclavian vein, left  axillary vein and left brachial veins.  *See table(s) above for measurements and observations.  Diagnosing physician: Kathlyn Sacramento MD Electronically signed by Kathlyn Sacramento MD on 10/17/2019 at 12:36:35 PM.    Final     Labs:  CBC: Recent Labs    10/26/19 0345 10/27/19 0558 10/28/19 0518 10/29/19 1016  WBC 8.4 8.1 7.1 9.2  HGB 8.4* 8.2* 7.9* 8.5*  HCT 28.2* 27.8* 27.3* 28.8*  PLT 289 266 285 271    COAGS: Recent Labs    10/28/19 0518 10/28/19 1420 10/28/19 2114 10/29/19 0738 10/29/19 1016  INR  --   --   --   --  1.1  APTT 63* 59* 52* 109*  --     BMP: Recent Labs    12/05/18 0847 12/05/18 0847 10/25/19 1345 10/25/19 2340 10/26/19 0345 10/28/19 0615  NA 141  --  139  --  139 141  K 3.9  --  3.3*  --  3.6 3.6  CL 102  --  98  --  100 105  CO2 31  --  27  --  27 23  GLUCOSE 94  --  124*  --  103* 91  BUN 14  --  16  --  15 11  CALCIUM 9.7  --  10.0  --  9.3 9.3  CREATININE 0.90   < > 0.80 0.73 0.72 0.70  GFRNONAA  --   --  >60 >60 >60 >60  GFRAA  --   --  >60 >60 >60 >60   < > = values in this interval not displayed.    LIVER FUNCTION TESTS: Recent Labs    12/05/18 0847 10/25/19 1345 10/26/19 0345 10/28/19 0615  BILITOT 0.4 0.3 0.5 0.8  AST 14 47* 41 31  ALT 13 74* 61* 49*  ALKPHOS 70 364* 307* 249*  PROT 7.6 8.0 7.0 6.7  ALBUMIN 4.4 3.7 3.3* 3.1*    TUMOR MARKERS: No results for input(s): AFPTM, CEA, CA199, CHROMGRNA in the last 8760 hours.  Assessment and Plan: Pt with hx LUE DVT, left breast mass with assoc adenopathy, bony/liver lesions; request received for image guided liver lesion biopsy; imaging studies were reviewed by Dr. Kathlene Cote.Risks and benefits of procedure was discussed with the patient  including, but not limited to bleeding, infection, damage to adjacent structures or low yield requiring additional tests.  All of the questions were answered and there is agreement to proceed.  Consent signed and in chart. Procedure scheduled  for later today.IV heparin has been stopped.      Thank you for this interesting consult.  I greatly enjoyed meeting Tangee Peddle and look forward to participating in their care.  A copy of this report was sent to the requesting provider on this date.  Electronically Signed: D. Rowe Robert, PA-C 10/29/2019, 1:48 PM   I spent a total of 25 minutes   in face to face in clinical consultation, greater than 50% of which was counseling/coordinating care for image guided liver lesion biopsy

## 2019-10-29 NOTE — Progress Notes (Addendum)
PROGRESS NOTE  Abigail [name] [id number] DOB: 13-Dec-1965 DOA: 10/25/2019 PCP: Hoyt Koch, MD  HPI/Recap of past 24 hours: Patient is a 54 year old female with history of hyperlipidemia, no much medical issues who suffered some minor injury on her left shoulder 3 months ago and had been having left arm pain since then.  Patient continued to have pain that prompted an upper extremity duplex that showed DVT on 1/6.  Patient was initiated on Eliquis.  Patient continued to have worsening pain on her hands, she was unable to lift up her wrist so came to emergency room last night.  In the emergency room, she had obvious swelling of the left upper extremity, hemodynamically stable.  95% on room air.  Hemoglobin 9.9.  Covid negative.  CT angiogram of the chest showed no evidence of PE, groundglass opacities right upper lobe, conglomerate of lymphadenopathy on the left axilla, mediastinal lymphadenopathy sclerotic and lytic lesions throughout the skeleton as well as soft tissue mass on the left breast. Repeat duplex consistent with acute DVT left subclavian, left axillary, left brachial and left radial and left ulnar veins.  No evidence of compartment syndrome, distal arterial supply intact.  10/29/19: Seen and examined.  Left upper extremity still significantly edematous but improving on heparin drip.  She has no swelling on her face or protrusion of veins on her chest.  She will be seen by oncology Dr. Benay Spice at patient's request.   Assessment/Plan: Principal Problem:   Acute deep vein thrombosis (DVT) of axillary vein of left upper extremity ([place]) Active Problems:   Community acquired pneumonia   Radial nerve palsy   Breast mass, left   DVT (deep vein thrombosis) in pregnancy   Hypokalemia  Extensive left upper extremity DVT suspect in the setting of presumed malignancy: Was on Eliquis.  Developed new blood clots on Eliquis. Significant LUE swelling and discomfort.  Continue heparin  drip for now. Continue pain management PRN ` Presumed metastatic breast cancer: Clinical and radiological evidence consistent with metastatic breast cancer.  Significant left upper extremity swelling. Possible biopsy today by interventional radiology.  Highly appreciated. Will be seen by Dr. Benay Spice at patient's request She never had screening mammogram.  She never had screening colonoscopy. Will need to be completed outpatient.  Resolved hypokalemia: Replaced.  Community-acquired pneumonia: Suspected.  Ruled out.  Procalcitonin and lactic acid negative. DC Rocephin.  Continue p.o. azithromycin x3 days for its anti-inflammatory properties.    DVT prophylaxis: Heparin infusion Code Status: Full code Family Communication: None Disposition Plan:  Patient is currently not appropriate for discharge at this time due to ongoing significant left upper extremity pain and swelling. Will plan to dc once oncology has signed off.   Consultants:   Oncology  Procedures:   None     Objective: Vitals:   10/28/19 0637 10/28/19 1754 10/28/19 2002 10/29/19 0627  BP: 92/61 105/69 121/67 107/68  Pulse: 92 100 (!) 101 97  Resp: 16 18  16   Temp: 98.3 F (36.8 C) 98.7 F (37.1 C) 98.3 F (36.8 C) 97.9 F (36.6 C)  TempSrc: Oral Oral Oral Oral  SpO2: 97% 96% 98% 97%  Weight:      Height:       No intake or output data in the 24 hours ending 10/29/19 1022 Filed Weights   10/26/19 0858  Weight: 72.8 kg    Exam:  . General: 54 y.o. year-old female pleasant, well-developed well-nourished no acute distress.  Alert and oriented x3.   Marland Kitchen  Cardiovascular: Regular rate and rhythm no rubs or gallops. Marland Kitchen Respiratory: Clear to auscultation no wheezes no rales.   . Abdomen: Soft normal bowel sounds present.   . Musculoskeletal: Left upper extremity significantly edematous with tenderness on palpation from shoulder to fingers.   Marland Kitchen Psychiatry: Pleasant, mood is appropriate for condition  and setting.  Data Reviewed: CBC: Recent Labs  Lab 10/25/19 1345 10/25/19 2340 10/26/19 0345 10/27/19 0558 10/28/19 0518  WBC 9.1 8.3 8.4 8.1 7.1  NEUTROABS 5.8  --   --   --   --   HGB 9.9* 8.4* 8.4* 8.2* 7.9*  HCT 32.7* 28.8* 28.2* 27.8* 27.3*  MCV 91.6 91.4 91.3 91.4 92.9  PLT 294 287 289 266 [id number]   Basic Metabolic Panel: Recent Labs  Lab 10/25/19 1345 10/25/19 2340 10/26/19 0345 10/28/19 0615  NA 139  --  139 141  K 3.3*  --  3.6 3.6  CL 98  --  100 105  CO2 27  --  27 23  GLUCOSE 124*  --  103* 91  BUN 16  --  15 11  CREATININE 0.80 0.73 0.72 0.70  CALCIUM 10.0  --  9.3 9.3  MG  --   --   --  2.0  PHOS  --   --   --  4.7*   GFR: Estimated Creatinine Clearance: 74.2 mL/min (by C-G formula based on SCr of 0.7 mg/dL). Liver Function Tests: Recent Labs  Lab 10/25/19 1345 10/26/19 0345 10/28/19 0615  AST 47* 41 31  ALT 74* 61* 49*  ALKPHOS 364* 307* 249*  BILITOT 0.3 0.5 0.8  PROT 8.0 7.0 6.7  ALBUMIN 3.7 3.3* 3.1*   No results for input(s): LIPASE, AMYLASE in the last 168 hours. No results for input(s): AMMONIA in the last 168 hours. Coagulation Profile: No results for input(s): INR, PROTIME in the last 168 hours. Cardiac Enzymes: No results for input(s): CKTOTAL, CKMB, CKMBINDEX, TROPONINI in the last 168 hours. BNP (last 3 results) No results for input(s): PROBNP in the last 8760 hours. HbA1C: No results for input(s): HGBA1C in the last 72 hours. CBG: No results for input(s): GLUCAP in the last 168 hours. Lipid Profile: No results for input(s): CHOL, HDL, LDLCALC, TRIG, CHOLHDL, LDLDIRECT in the last 72 hours. Thyroid Function Tests: No results for input(s): TSH, T4TOTAL, FREET4, T3FREE, THYROIDAB in the last 72 hours. Anemia Panel: No results for input(s): VITAMINB12, FOLATE, FERRITIN, TIBC, IRON, RETICCTPCT in the last 72 hours. Urine analysis:    Component Value Date/Time   COLORURINE LT. YELLOW 08/04/2011 0805   APPEARANCEUR Cloudy  08/04/2011 0805   LABSPEC 1.020 08/04/2011 0805   PHURINE 6.0 08/04/2011 0805   GLUCOSEU NEGATIVE 08/04/2011 0805   HGBUR LARGE 08/04/2011 0805   BILIRUBINUR NEGATIVE 08/04/2011 0805   KETONESUR NEGATIVE 08/04/2011 0805   UROBILINOGEN 0.2 08/04/2011 0805   NITRITE NEGATIVE 08/04/2011 0805   LEUKOCYTESUR NEGATIVE 08/04/2011 0805   Sepsis Labs: @LABRCNTIP (procalcitonin:4,lacticidven:4)  ) Recent Results (from the past 240 hour(s))  SARS CORONAVIRUS 2 (TAT 6-24 HRS) Nasopharyngeal Nasopharyngeal Swab     Status: None   Collection Time: 10/25/19  8:48 PM   Specimen: Nasopharyngeal Swab  Result Value Ref Range Status   SARS Coronavirus 2 NEGATIVE NEGATIVE Final    Comment: (NOTE) SARS-CoV-2 target nucleic acids are NOT DETECTED. The SARS-CoV-2 RNA is generally detectable in upper and lower respiratory specimens during the acute phase of infection. Negative results do not preclude SARS-CoV-2 infection, do not rule out  co-infections with other pathogens, and should not be used as the sole basis for treatment or other patient management decisions. Negative results must be combined with clinical observations, patient history, and epidemiological information. The expected result is Negative. Fact Sheet for Patients: SugarRoll.be Fact Sheet for Healthcare Providers: https://www.woods-mathews.com/ This test is not yet approved or cleared by the [place] FDA and  has been authorized for detection and/or diagnosis of SARS-CoV-2 by FDA under an Emergency Use Authorization (EUA). This EUA will remain  in effect (meaning this test can be used) for the duration of the COVID-19 declaration under Section 56 4(b)(1) of the Act, 21 U.S.C. section 360bbb-3(b)(1), unless the authorization is terminated or revoked sooner. Performed at [facility], [place] [address], [place], [place] [address]       Studies: No results found.  Scheduled  Meds: . azithromycin  500 mg Oral QHS    Continuous Infusions: . sodium chloride 50 mL/hr at 10/28/19 2019  . cefTRIAXone (ROCEPHIN)  IV 1 g (10/28/19 2020)  . heparin       LOS: 4 days     Kayleen Memos, MD [facility] Pager [phone]  If 7PM-7AM, please contact night-coverage [email or web address] Password [facility] 10/29/2019, 10:22 AM

## 2019-10-29 NOTE — Plan of Care (Signed)

## 2019-10-29 NOTE — Sedation Documentation (Signed)
Ultrasound used in the IR suite. [place] machine changed out. Dr. Kathlene Cote reviewing previous scans.  Patient is comfortable, no distress.

## 2019-10-29 NOTE — Progress Notes (Signed)
Occupational Therapy Treatment Patient Details Name: Abigail [name] MRN: [id number] DOB: 1966/08/12 Today's Date: 10/29/2019    History of present illness Pt is a 54 year old woman with h/o HLD admitted 10/25/19 with worsening pain, weakness and edema of L UE. + for L UE DVT an suspect metastatic breast cancer.   OT comments  Pt performed gentle AAROM wrist and finger extension with OT.  Pt will benefit from a wrist support splint to protect L wrist.  Pt with very limited AROM L wrist as well as pt does have significant edema.  Educated on positoning and elevation of LUE.  RN aware.  MD aware. Pt waiting for liver biopsy this afternoon.  OT did place order per Dr [place] Crane for L wrist support splint.  Will follow up with ortho tech regarding best option for pt  Follow Up Recommendations  Outpatient OT    Equipment Recommendations  None recommended by OT    Recommendations for Other Services      Precautions / Restrictions Precautions Precaution Comments: L wrist drop.  INR not therapetic       Mobility Bed Mobility Overal bed mobility: Independent                Transfers Overall transfer level: Modified independent                    Balance Overall balance assessment: Modified Independent                                         ADL either performed or assessed with clinical judgement        Vision Baseline Vision/History: No visual deficits            Cognition Arousal/Alertness: Awake/alert Behavior During Therapy: WFL for tasks assessed/performed Overall Cognitive Status: Within Functional Limits for tasks assessed                                                     Pertinent Vitals/ Pain       Pain Assessment: No/denies pain Faces Pain Scale: Hurts a little bit Pain Location: L UE Pain Descriptors / Indicators: Throbbing Pain Intervention(s): Monitored during session;Repositioned  Home Living  Family/patient expects to be discharged to:: Private residence Living Arrangements: Children(43 year old daughter) Available Help at Discharge: Family;Available 24 hours/day Type of Home: House       Home Layout: Multi-level Alternate Level Stairs-Number of Steps: flight   Bathroom Shower/Tub: Occupational psychologist: Standard     Home Equipment: None          Prior Functioning/Environment Level of Independence: Independent        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        Progress Toward Goals  OT Goals(current goals can now be found in the care plan section)  Progress towards OT goals: Progressing toward goals     Plan Discharge plan remains appropriate    Co-evaluation                 AM-PAC OT "6 Clicks" Daily Activity     Outcome Measure   Help from another person eating meals?: A Little  Help from another person taking care of personal grooming?: A Lot Help from another person toileting, which includes using toliet, bedpan, or urinal?: None Help from another person bathing (including washing, rinsing, drying)?: A Lot Help from another person to put on and taking off regular upper body clothing?: A Little Help from another person to put on and taking off regular lower body clothing?: A Little 6 Click Score: 17    End of Session    OT Visit Diagnosis: Muscle weakness (generalized) (M62.81)   Activity Tolerance     Patient Left in chair;with call bell/phone within reach   Nurse Communication          Time: 1305-1330 OT Time Calculation (min): 25 min  Charges: OT General Charges $OT Visit: 1 Visit OT Treatments $Therapeutic Activity: 23-37 mins  Kari Baars, [place] [phone] Office- [phone]      Mahamud Metts, Edwena Felty D 10/29/2019, 2:29 PM

## 2019-10-30 ENCOUNTER — Inpatient Hospital Stay: Payer: Managed Care, Other (non HMO) | Admitting: Hematology

## 2019-10-30 LAB — CBC WITH DIFFERENTIAL/PLATELET
Abs Immature Granulocytes: 0.54 10*3/uL — ABNORMAL HIGH (ref 0.00–0.07)
Basophils Absolute: 0.1 10*3/uL (ref 0.0–0.1)
Basophils Relative: 1 %
Eosinophils Absolute: 0.5 10*3/uL (ref 0.0–0.5)
Eosinophils Relative: 6 %
HCT: 25.5 % — ABNORMAL LOW (ref 36.0–46.0)
Hemoglobin: 7.4 g/dL — ABNORMAL LOW (ref 12.0–15.0)
Immature Granulocytes: 7 %
Lymphocytes Relative: 21 %
Lymphs Abs: 1.6 10*3/uL (ref 0.7–4.0)
MCH: 26.9 pg (ref 26.0–34.0)
MCHC: 29 g/dL — ABNORMAL LOW (ref 30.0–36.0)
MCV: 92.7 fL (ref 80.0–100.0)
Monocytes Absolute: 1 10*3/uL (ref 0.1–1.0)
Monocytes Relative: 13 %
Neutro Abs: 4 10*3/uL (ref 1.7–7.7)
Neutrophils Relative %: 52 %
Platelets: 230 10*3/uL (ref 150–400)
RBC: 2.75 MIL/uL — ABNORMAL LOW (ref 3.87–5.11)
RDW: 15 % (ref 11.5–15.5)
WBC: 7.6 10*3/uL (ref 4.0–10.5)
nRBC: 7.2 % — ABNORMAL HIGH (ref 0.0–0.2)

## 2019-10-30 LAB — COMPREHENSIVE METABOLIC PANEL
ALT: 51 U/L — ABNORMAL HIGH (ref 0–44)
AST: 40 U/L (ref 15–41)
Albumin: 2.8 g/dL — ABNORMAL LOW (ref 3.5–5.0)
Alkaline Phosphatase: 242 U/L — ABNORMAL HIGH (ref 38–126)
Anion gap: 7 (ref 5–15)
BUN: 11 mg/dL (ref 6–20)
CO2: 26 mmol/L (ref 22–32)
Calcium: 9.1 mg/dL (ref 8.9–10.3)
Chloride: 106 mmol/L (ref 98–111)
Creatinine, Ser: 0.64 mg/dL (ref 0.44–1.00)
GFR calc Af Amer: >60 mL/min (ref >60)
GFR calc non Af Amer: >60 mL/min (ref >60)
Glucose, Bld: 100 mg/dL — ABNORMAL HIGH (ref 70–99)
Potassium: 3.7 mmol/L (ref 3.5–5.1)
Sodium: 139 mmol/L (ref 135–145)
Total Bilirubin: 0.5 mg/dL (ref 0.3–1.2)
Total Protein: 6.2 g/dL — ABNORMAL LOW (ref 6.5–8.1)

## 2019-10-30 LAB — CBC
HCT: 25 % — ABNORMAL LOW (ref 36.0–46.0)
Hemoglobin: 7.4 g/dL — ABNORMAL LOW (ref 12.0–15.0)
MCH: 27.3 pg (ref 26.0–34.0)
MCHC: 29.6 g/dL — ABNORMAL LOW (ref 30.0–36.0)
MCV: 92.3 fL (ref 80.0–100.0)
Platelets: 228 10*3/uL (ref 150–400)
RBC: 2.71 MIL/uL — ABNORMAL LOW (ref 3.87–5.11)
RDW: 15 % (ref 11.5–15.5)
WBC: 8 10*3/uL (ref 4.0–10.5)
nRBC: 6.4 % — ABNORMAL HIGH (ref 0.0–0.2)

## 2019-10-30 LAB — APTT: aPTT: 66 seconds — ABNORMAL HIGH (ref 24–36)

## 2019-10-30 LAB — HEPARIN LEVEL (UNFRACTIONATED)
Heparin Unfractionated: 0.55 IU/mL (ref 0.30–0.70)
Heparin Unfractionated: 0.55 IU/mL (ref 0.30–0.70)

## 2019-10-30 NOTE — Progress Notes (Signed)
Occupational Therapy Treatment Patient Details Name: Abigail [name] MRN: [id number] DOB: 1966/04/10 Today's Date: 10/30/2019    History of present illness Pt is a 54 year old woman with h/o HLD admitted 10/25/19 with worsening pain, weakness and edema of L UE. + for L UE DVT an suspect metastatic breast cancer.   OT comments  Pt reports edema is decreasing. Still tight especially at MCPs.  Wrist is not tight but she has muscle weakness; only able to extend to neutral, even in gravity eliminated plane.  Performed AAROM, stretch and edema management activities  Follow Up Recommendations  Outpatient OT    Equipment Recommendations  None recommended by OT    Recommendations for Other Services      Precautions / Restrictions Precautions Precaution Comments: L wrist drop       Mobility Bed Mobility                  Transfers                      Balance                                           ADL either performed or assessed with clinical judgement   ADL                                               Vision       Perception     Praxis      Cognition Arousal/Alertness: Awake/alert Behavior During Therapy: WFL for tasks assessed/performed Overall Cognitive Status: Within Functional Limits for tasks assessed                                          Exercises Exercises: Other exercises Other Exercises Other Exercises: aarom ff. abd, horizontal abd LUE Other Exercises: positioning, retrograde massage for edema Other Exercises: encouraged frequent movement esp hand, slowly and with stretch.  Pt can only bring L wrist to neutral.  She is working on tip pinch and opposition to 3rd digit.  Encouraged stretch of MCPs as tolerated   Shoulder Instructions       General Comments will provide wash mitt to encourage increased use of LUE    Pertinent Vitals/ Pain       Pain Assessment: No/denies  pain  Home Living                                          Prior Functioning/Environment              Frequency  Min 2X/week        Progress Toward Goals  OT Goals(current goals can now be found in the care plan section)  Progress towards OT goals: Progressing toward goals     Plan      Co-evaluation                 AM-PAC OT "6 Clicks" Daily Activity     Outcome Measure   Help from another person eating  meals?: A Little Help from another person taking care of personal grooming?: A Little Help from another person toileting, which includes using toliet, bedpan, or urinal?: None Help from another person bathing (including washing, rinsing, drying)?: A Lot Help from another person to put on and taking off regular upper body clothing?: A Little Help from another person to put on and taking off regular lower body clothing?: A Little 6 Click Score: 18    End of Session    OT Visit Diagnosis: Muscle weakness (generalized) (M62.81)   Activity Tolerance Patient tolerated treatment well   Patient Left in chair;with call bell/phone within reach   Nurse Communication          Time: [id number] OT Time Calculation (min): 20 min  Charges: OT General Charges $OT Visit: 1 Visit OT Treatments $Therapeutic Activity: 8-22 mins  [place], OTR/L Acute Rehabilitation Services 10/30/2019   [place] 10/30/2019, 12:37 PM

## 2019-10-30 NOTE — Progress Notes (Addendum)
ANTICOAGULATION CONSULT NOTE - Follow Up Consult  Pharmacy Consult for Heparin Indication: Acute UE DVT  Allergies  Allergen Reactions  . Shellfish Allergy Itching and Swelling  . Sulfamethoxazole     REACTION: swelling    Patient Measurements: Height: 5\' 1"  (154.9 cm) Weight: 160 lb 9.6 oz (72.8 kg) IBW/kg (Calculated) : 47.8 Heparin Dosing Weight: 64 kg  Vital Signs: Temp: 98.5 F (36.9 C) (01/19 0342) Temp Source: Oral (01/19 0342) BP: 101/71 (01/19 0342) Pulse Rate: 103 (01/19 0342)  Labs: Recent Labs    10/28/19 0518 10/28/19 0518 10/28/19 0615 10/28/19 1420 10/28/19 2114 10/29/19 0738 10/29/19 1016 10/30/19 0555  HGB 7.9*   < >  --   --   --   --  8.5* 7.4*  7.4*  HCT 27.3*  --   --   --   --   --  28.8* 25.5*  25.0*  PLT 285  --   --   --   --   --  271 230  228  APTT 63*  --   --    < > 52* 109*  --  66*  LABPROT  --   --   --   --   --   --  14.4  --   INR  --   --   --   --   --   --  1.1  --   HEPARINUNFRC 0.87*  --   --    < > 0.84* 1.07*  --  0.55  CREATININE  --   --  0.70  --   --   --   --  0.64   < > = values in this interval not displayed.    Estimated Creatinine Clearance: 74.2 mL/min (by C-G formula based on SCr of 0.64 mg/dL).   Medications:   Assessment: Patient on heparin infusion for history of VTE. Pt recently started on apixaban PTA for DVT diagnosed on 10/17/19. Pt presented to ED on 1/14 with left arm swelling/pain. UE doppler on 1/14 showed acute left upper extremity DVT. Anticoagulation converted to heparin infusion while inpatient.   Today, 10/30/19  HL = 0.55, aPTT = 66 seconds correlating within therapeutic range on heparin infusion of 1300 units/hr  Confirmed with RN that heparin infusing at correct rate. No signs/symptoms of bleeding.  CBC: Hgb (7.4) - low and decreased. No bleeding noted. Plt - WNL and stable  Goal of Therapy:  Heparin level 0.3-0.7 units/ml aPTT 66-102 seconds Monitor platelets by anticoagulation  protocol: Yes   Plan:   Continue heparin infusion at 1300 units/hr  Check confirmatory HL in 6 hours  Monitor using HL now that HL and aPTT correlate  CBC daily  Monitor for signs/symptoms of bleeding  Follow along for eventual transition to PO or subQ anticoagulation  Lenis Noon, PharmD 10/30/2019,8:53 AM  Addendum - Afternoon Follow Up:  Assessment:  Confirmatory HL = 0.55 remains therapeutic on heparin infusion of 1300 units/hr  Confirmed with RN that heparin infusing at correct rate. No signs/symptoms of bleeding.   Plan:  Continue heparin infusion at current rate of 1300 units/hr  Check HL and CBC with AM labs tomorrow  Lenis Noon, PharmD 10/30/19 12:58 PM

## 2019-10-30 NOTE — Progress Notes (Addendum)
HEMATOLOGY-ONCOLOGY PROGRESS NOTE  SUBJECTIVE: Still reports ongoing left arm pain and edema but thinks overall improving.  She had a liver biopsy performed yesterday which she tolerated well.  She has no other complaints this morning.  PHYSICAL EXAMINATION:  Vitals:   10/29/19 2334 10/30/19 0342  BP: 96/62 101/71  Pulse: (!) 110 (!) 103  Resp:    Temp: 98.2 F (36.8 C) 98.5 F (36.9 C)  SpO2: 95% 95%   Filed Weights   10/26/19 0858  Weight: 160 lb 9.6 oz (72.8 kg)    Intake/Output from previous day: 01/18 0701 - 01/19 0700 In: 1560.6 [P.O.:355; I.V.:1205.6] Out: -   GENERAL:alert, no distress and comfortable EYES: normal, Conjunctiva are pink and non-injected, sclera clear OROPHARYNX:no exudate, no erythema and lips, buccal mucosa, and tongue normal  LYMPH: Palpable right supraclavicular lymph node LUNGS: Diminished breath sounds left base Breast exam: Right breast with no palpable masses, left breast with peau d'orange skin changes, palpable mass in the outer aspect of the left breast HEART: regular rate & rhythm and no murmurs and no lower extremity edema, no right upper extremity edema, left upper extremity edematous and tight ABDOMEN:abdomen soft, non-tender and normal bowel sounds Musculoskeletal:no cyanosis of digits and no clubbing  NEURO: alert & oriented x 3 with fluent speech, no focal motor/sensory deficits  LABORATORY DATA:  I have reviewed the data as listed CMP Latest Ref Rng & Units 10/30/2019 10/28/2019 10/26/2019  Glucose 70 - 99 mg/dL 100(H) 91 103(H)  BUN 6 - 20 mg/dL '11 11 15  ' Creatinine 0.44 - 1.00 mg/dL 0.64 0.70 0.72  Sodium 135 - 145 mmol/L 139 141 139  Potassium 3.5 - 5.1 mmol/L 3.7 3.6 3.6  Chloride 98 - 111 mmol/L 106 105 100  CO2 22 - 32 mmol/L '26 23 27  ' Calcium 8.9 - 10.3 mg/dL 9.1 9.3 9.3  Total Protein 6.5 - 8.1 g/dL 6.2(L) 6.7 7.0  Total Bilirubin 0.3 - 1.2 mg/dL 0.5 0.8 0.5  Alkaline Phos 38 - 126 U/L 242(H) 249(H) 307(H)  AST 15 - 41  U/L 40 31 41  ALT 0 - 44 U/L 51(H) 49(H) 61(H)    Lab Results  Component Value Date   WBC 8.0 10/30/2019   WBC 7.6 10/30/2019   HGB 7.4 (L) 10/30/2019   HGB 7.4 (L) 10/30/2019   HCT 25.0 (L) 10/30/2019   HCT 25.5 (L) 10/30/2019   MCV 92.3 10/30/2019   MCV 92.7 10/30/2019   PLT 228 10/30/2019   PLT 230 10/30/2019   NEUTROABS 4.0 10/30/2019    DG Chest 2 View  Result Date: 10/25/2019 CLINICAL DATA:  Left upper extremity region pain and edema EXAM: CHEST - 2 VIEW COMPARISON:  None. FINDINGS: There is atelectatic change in the left base with small left pleural effusion. There is subtle increased opacity in the right upper lobe which may represent earliest changes of pneumonia. Lungs elsewhere clear. Heart size and pulmonary vascularity are normal. No adenopathy. No bone lesions. No pneumothorax. There is soft tissue swelling in the visualized left upper extremity. IMPRESSION: 1.  Small left pleural effusion with left base atelectasis. 2. Suspect earliest changes of pneumonia right upper lobe. Question atypical organism pneumonia. 3.  Cardiac silhouette normal. 4.  No adenopathy. 5.  Soft tissue swelling of visualized left upper extremity. Electronically Signed   By: Lowella Grip III M.D.   On: 10/25/2019 14:17   CT Angio Chest PE W/Cm &/Or Wo Cm  Result Date: 10/25/2019 CLINICAL DATA:  Swelling  and pain in left upper extremity with blood clot. Left upper back pain. EXAM: CT ANGIOGRAPHY CHEST WITH CONTRAST TECHNIQUE: Multidetector CT imaging of the chest was performed using the standard protocol during bolus administration of intravenous contrast. Multiplanar CT image reconstructions and MIPs were obtained to evaluate the vascular anatomy. CONTRAST:  61m OMNIPAQUE IOHEXOL 350 MG/ML SOLN COMPARISON:  None. FINDINGS: Cardiovascular: No filling defects in the pulmonary arteries to suggest pulmonary emboli. The given history of a left axillary vein thrombus cannot be visualized on this study.  Heart is normal size. Aorta is normal caliber. Mediastinum/Nodes: There is abnormal soft tissue in the left axilla measuring 4.1 x 2.3 cm on image 30. This is presumably conglomerate nodal mass. Borderline sized mediastinal lymph nodes. Prevascular lymph node measures 11 mm. Enlarged right paratracheal lymph node measures 15 mm. Lungs/Pleura: Moderate-sized left pleural effusion. Atelectasis in the left lower lobe. Ground-glass airspace opacity in the posterior right upper lobe could reflect early infiltrate. Scattered small nodules within the lungs. 3 mm nodule in the right upper lobe on image 41. 3 mm nodule in the left upper lobe on image 47. Clustered nodules seen posteriorly in the superior segment of the right lower lobe. Upper Abdomen: Imaging into the upper abdomen shows no acute findings. Musculoskeletal: Rounded masslike density seen in the left breast measuring 4.2 x 2.9 cm. Recommend correlation with mammography. Mixed lytic and sclerotic lesions diffusely throughout the visualized spine as well as turn Um and manubrium, left scapula, bilateral humeral heads and multiple bilateral ribs compatible with osseous metastatic disease. Review of the MIP images confirms the above findings. IMPRESSION: No evidence of pulmonary embolus. Rounded soft tissue mass in the left breast. Cannot exclude breast cancer given the below findings. Recommend correlation with mammography. Numerous left axillary lymph nodes with conglomerate nodal mass. There is abnormal appearance of the left latissimus Dorsey and muscles surrounding the left scapula concerning for mass/tumor involvement. Mild mediastinal adenopathy. Diffuse sclerotic and lytic lesions throughout the visualized skeleton compatible with osseous metastatic disease. Ground-glass airspace opacity in the superior segment of the right lower lobe with clustered nodular densities. Similar airspace disease posteriorly in the right upper lobe. Cannot exclude areas of  early pneumonia. Moderate left pleural effusion. Compressive atelectasis in the left lower lobe. These results were called by telephone at the time of interpretation on 10/25/2019 at 7:19 pm to provider MARGAUX VENTER , who verbally acknowledged these results. Electronically Signed   By: KRolm BaptiseM.D.   On: 10/25/2019 19:19   CT ABDOMEN PELVIS W CONTRAST  Result Date: 10/26/2019 CLINICAL DATA:  Followup chest CT demonstrating metastatic disease and left breast mass EXAM: CT ABDOMEN AND PELVIS WITH CONTRAST TECHNIQUE: Multidetector CT imaging of the abdomen and pelvis was performed using the standard protocol following bolus administration of intravenous contrast. CONTRAST:  1068mOMNIPAQUE IOHEXOL 300 MG/ML  SOLN COMPARISON:  Chest CT from yesterday. FINDINGS: Lower chest: 4 cm enhancing left breast mass with extensive lymphadenopathy involving the left axilla and tumor involving the left chest wall including the latissimus dorsi and serratus anterior muscles. Left pleural effusion with overlying atelectasis and pulmonary nodules suspicious for pulmonary metastatic disease. Hepatobiliary: Large ill-defined lesion in segment 4 B of the liver worrisome for a metastatic focus. I do not see any other definite liver lesions. The gallbladder is unremarkable. No common bile duct dilatation. The portal and hepatic veins are patent. Pancreas: No mass, inflammation or ductal dilatation. Spleen: Normal size.  No focal lesions. Adrenals/Urinary Tract: The  adrenal glands and kidneys are unremarkable. The bladder is normal. Stomach/Bowel: The stomach, duodenum, small bowel and colon are unremarkable. No acute inflammatory changes, mass lesions or obstructive findings. The terminal ileum is normal. The appendix is normal. Moderate stool noted in the mildly distended rectum. Vascular/Lymphatic: The aorta and branch vessels are patent. The major venous structures are patent. No mesenteric or retroperitoneal mass or  adenopathy. No pelvic adenopathy. Reproductive: Mildly enlarged fibroid uterus noted. The ovaries appear normal. Other: No free pelvic fluid collections. No inguinal mass or adenopathy. Musculoskeletal: Diffuse osseous metastatic disease with mixed lytic and sclerotic lesions throughout the spine, ribs and pelvis and both hips. No pathologic fracture and no spinal canal compromise. IMPRESSION: 1. Ill-defined 3.5 cm lesion in segment 4B of the liver, likely a metastatic focus. 2. No other abdominal/pelvic metastatic disease identified. 3. Diffuse osseous metastatic disease with mixed lytic and sclerotic process. No pathologic fracture or spinal canal compromise. 4. Left pleural effusion and small metastatic pulmonary nodules. 5. Extensive left breast and chest wall disease as better seen on recent chest CT. Electronically Signed   By: Marijo Sanes M.D.   On: 10/26/2019 17:01   IR US Guide Bx Asp/Drain  Result Date: 10/29/2019 CLINICAL DATA:  Left-sided breast mass, left axillary lymphadenopathy, chest wall mass and mass within the left lobe of the liver. The patient presents for liver biopsy. EXAM: ULTRASOUND GUIDED CORE BIOPSY OF LIVER MEDICATIONS: 4.0 mg IV Versed; 100 mcg IV Fentanyl Total Moderate Sedation Time: 33 The patient's level of consciousness and physiologic status were continuously monitored during the procedure by Radiology nursing. PROCEDURE: The procedure, risks, benefits, and alternatives were explained to the patient. Questions regarding the procedure were encouraged and answered. The patient understands and consents to the procedure. A time out was performed prior to initiating the procedure. Ultrasound was performed to localize the liver and a lesion in the left lobe. The abdominal wall was prepped with chlorhexidine in a sterile fashion, and a sterile drape was applied covering the operative field. A sterile gown and sterile gloves were used for the procedure. Local anesthesia was provided  with 1% Lidocaine. Under ultrasound guidance, a 17 gauge needle was advanced into the left lobe of the liver. Three separate coaxial 18 gauge core biopsy samples were obtained. Samples were submitted in formalin. Gel-Foam pledgets were advanced through the outer needle as the needle was retracted and removed. COMPLICATIONS: None. FINDINGS: Relatively hypoechoic mass within the left lobe of the liver is ill-defined but localized by ultrasound and corresponds to the CT abnormality seen previously. This region measures approximately 5 cm in diameter by ultrasound. Solid tissue was obtained. IMPRESSION: Ultrasound-guided core biopsy performed of a lesion within the left lobe of the liver measuring approximately 5 cm in diameter by ultrasound. Electronically Signed   By: Aletta Edouard M.D.   On: 10/29/2019 17:20   UE VENOUS DUPLEX ([facility] & [facility] 7 am - 7 pm)  Result Date: 10/26/2019 UPPER VENOUS STUDY  Indications: Swelling Anticoagulation: Eliquis. Limitations: Body habitus, poor ultrasound/tissue interface and patient pain tolerance. Comparison Study: 10/17/2019-age indeterminate DVT left upper extremity Performing Technologist: Oliver Hum RVT  Examination Guidelines: A complete evaluation includes B-mode imaging, spectral Doppler, color Doppler, and power Doppler as needed of all accessible portions of each vessel. Bilateral testing is considered an integral part of a complete examination. Limited examinations for reoccurring indications may be performed as noted.  Right Findings: +----------+------------+---------+-----------+----------+-------+ RIGHT     CompressiblePhasicitySpontaneousPropertiesSummary +----------+------------+---------+-----------+----------+-------+ Subclavian  Full       Yes       Yes                      +----------+------------+---------+-----------+----------+-------+  Left Findings: +----------+------------+---------+-----------+----------+--------------+ LEFT       CompressiblePhasicitySpontaneousProperties   Summary     +----------+------------+---------+-----------+----------+--------------+ IJV           Full       Yes       Yes                             +----------+------------+---------+-----------+----------+--------------+ Subclavian    None       No        No                   Acute      +----------+------------+---------+-----------+----------+--------------+ Axillary      None       No        No                   Acute      +----------+------------+---------+-----------+----------+--------------+ Brachial      None                 No                   Acute      +----------+------------+---------+-----------+----------+--------------+ Radial        None                                      Acute      +----------+------------+---------+-----------+----------+--------------+ Ulnar         None                                      Acute      +----------+------------+---------+-----------+----------+--------------+ Cephalic      Full                                                 +----------+------------+---------+-----------+----------+--------------+ Basilic                                             Not visualized +----------+------------+---------+-----------+----------+--------------+  Summary:  Right: No evidence of thrombosis in the subclavian.  Left: No evidence of superficial vein thrombosis in the upper extremity. Findings consistent with acute deep vein thrombosis involving the left subclavian vein, left axillary vein, left brachial veins, left radial veins and left ulnar veins. Unable to visualize basilic vein.  *See table(s) above for measurements and observations.  Diagnosing physician: Monica Martinez MD Electronically signed by Monica Martinez MD on 10/26/2019 at 3:53:02 PM.    Final    VAS [place] UPPER EXTREMITY VENOUS DUPLEX  Result Date: 10/17/2019 UPPER VENOUS STUDY  Indications:  Edema, and intermittent left arm pain and swelling since end of 06/2019. Patient denies any trauma, SOB or chest pain. Risk Factors: None identified Patient had difficulty with keeping arm bent or straight during exam  due to pain. Anticoagulation: None. Comparison Study: None Performing Technologist: Alecia Mackin RVT, RDCS (AE), RDMS  Examination Guidelines: A complete evaluation includes B-mode imaging, spectral Doppler, color Doppler, and power Doppler as needed of all accessible portions of each vessel. Bilateral testing is considered an integral part of a complete examination. Limited examinations for reoccurring indications may be performed as noted.  Left Findings: +----------+------------+---------+-----------+---------------+----------------+ LEFT      CompressiblePhasicitySpontaneous  Properties       Summary      +----------+------------+---------+-----------+---------------+----------------+ IJV           Full       Yes       Yes                                    +----------+------------+---------+-----------+---------------+----------------+ Subclavian               No        No                     vein not well                                                            seen proximal to                                                             mid chest.    +----------+------------+---------+-----------+---------------+----------------+ Axillary    Partial      No        No          rigid           Age                                                   w/compression  Indeterminate   +----------+------------+---------+-----------+---------------+----------------+ Brachial    Partial      No        No         dilated          Age                                                                  Indeterminate   +----------+------------+---------+-----------+---------------+----------------+ Radial        Full       Yes       Yes                                     +----------+------------+---------+-----------+---------------+----------------+ Ulnar         Full  Yes       Yes                                    +----------+------------+---------+-----------+---------------+----------------+ Cephalic      Full       Yes       Yes                                    +----------+------------+---------+-----------+---------------+----------------+ Basilic       Full       Yes       Yes                                    +----------+------------+---------+-----------+---------------+----------------+ Collaterals present along course of brachial veins. The medial paired brachial vein appears almost occluded from axiallry down to antecubital fossa. Findings reported to Dr. Netta Cedars at 11:45 am.  Summary:  Left: No evidence of superficial vein thrombosis in the upper extremity. Findings consistent with age indeterminate non-occlusive deep vein thrombosis involving the left subclavian vein, left axillary vein and left brachial veins.  *See table(s) above for measurements and observations.  Diagnosing physician: Kathlyn Sacramento MD Electronically signed by Kathlyn Sacramento MD on 10/17/2019 at 12:36:35 PM.    Final     ASSESSMENT AND PLAN: 1.  Left breast mass with axillary and mediastinal adenopathy, liver lesion, and sclerotic and lytic bone lesions concerning for metastatic breast cancer 2.  Left upper extremity DVT 3.  Mild anemia  [name] appears stable.  She is currently undergoing work-up for suspected metastatic breast cancer.  Liver biopsy was obtained on 10/29/2019 and results are pending.  She remains on a heparin drip for her left upper extremity DVT.  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  Recommendations: 1.  We will follow up on pathology and discuss results once available.  We briefly discussed with the patient that we need to confirm the diagnosis and that we will also send the tissue for ER/PR and  HER-2 testing.  Treatment options will be discussed once we have all this information. 2.  We will obtain a CA 27.29 with her a.m. labs for baseline. 3.  Continue heparin drip for now.  Recommend transitioning to Lovenox 1.5 mg/kg daily upon discharge.  We can further discuss transitioning her back to Eliquis at a later date as an outpatient.   LOS: 5 days   Mikey Bussing, DNP, AGPCNP-BC, AOCNP 10/30/19 [name] was interviewed and examined.  I reviewed the CT images.  I was asked to see her for management of an apparent diagnosis of metastatic breast cancer.  The pathology from the 10/29/2019 liver biopsy is pending.  [name] understands no therapy will be curative if she is confirmed to have metastatic breast cancer.  We will recommend systemic therapy based on the final pathology results including the ER/PR and HER-2 status.  She is currently symptomatic with left upper extremity swelling related to a DVT.  I agree with continuing heparin anticoagulation and converting to Lovenox at discharge.  She can be switched back to Eliquis as the left upper extremity swelling improved.  The anemia is most likely related to metastatic breast cancer involving the bone marrow.  She will likely require transfusion support with the initiation  of systemic therapy. Outpatient follow-up will be scheduled at the Cancer center.

## 2019-10-30 NOTE — Progress Notes (Signed)
PROGRESS NOTE  Abigail [name] [id number] DOB: 09/02/1966 DOA: 10/25/2019 PCP: Hoyt Koch, MD  HPI/Recap of past 24 hours: Patient is a 54 year old female with history of hyperlipidemia, no much medical issues who suffered some minor injury on her left shoulder 3 months ago and had been having left arm pain since then.  Patient continued to have pain that prompted an upper extremity duplex that showed DVT on 1/6.  Patient was initiated on Eliquis.  Patient continued to have worsening pain on her hands, she was unable to lift up her wrist so came to emergency room last night.  In the emergency room, she had obvious swelling of the left upper extremity, hemodynamically stable.  95% on room air.  Hemoglobin 9.9.  Covid negative.  CT angiogram of the chest showed no evidence of PE, groundglass opacities right upper lobe, conglomerate of lymphadenopathy on the left axilla, mediastinal lymphadenopathy sclerotic and lytic lesions throughout the skeleton as well as soft tissue mass on the left breast. Repeat duplex consistent with acute DVT left subclavian, left axillary, left brachial and left radial and left ulnar veins.  No evidence of compartment syndrome, distal arterial supply intact.  10/30/19: Seen and examined.  Reports persistent discomfort in her left upper extremity.  On heparin drip.  Oncology following, appreciate assistance.    Assessment/Plan: Principal Problem:   Acute deep vein thrombosis (DVT) of axillary vein of left upper extremity (HCC) Active Problems:   Community acquired pneumonia   Radial nerve palsy   Breast mass, left   DVT (deep vein thrombosis) in pregnancy   Hypokalemia  Extensive left upper extremity DVT suspect in the setting of presumed malignancy: Was on Eliquis.  Developed new blood clots on Eliquis. Significant LUE swelling and discomfort.  Continue heparin drip for now. Continue pain management PRN.  We will transition to full dose Lovenox prior to  discharge as recommended by oncology.  Presumed metastatic breast cancer: Clinical and radiological evidence consistent with metastatic breast cancer with liver and bony lesions.  Significant left upper extremity swelling. Post biopsy by interventional radiology on 10/29/2019. Dr. Benay Spice following.  Highly appreciate assistance. She never had screening mammogram.  She never had screening colonoscopy. Will need to be completed outpatient.  Resolved hypokalemia: Replaced.  Community-acquired pneumonia: Suspected.  Ruled out.  Procalcitonin and lactic acid negative. Completed Rocephin and azithromycin.    DVT prophylaxis: Heparin infusion Code Status: Full code Family Communication: None Disposition Plan:  Patient is currently not appropriate for discharge at this time due to ongoing significant left upper extremity pain and swelling. Will plan to dc within the next 24-48 H.   Consultants:   Oncology  Procedures:   None     Objective: Vitals:   10/29/19 1938 10/29/19 2334 10/30/19 0342 10/30/19 1344  BP: 103/67 96/62 101/71 117/85  Pulse: (!) 116 (!) 110 (!) 103 (!) 106  Resp: 20   17  Temp: 98.7 F (37.1 C) 98.2 F (36.8 C) 98.5 F (36.9 C) 99 F (37.2 C)  TempSrc: Oral Oral Oral Oral  SpO2: 95% 95% 95% 96%  Weight:      Height:        Intake/Output Summary (Last 24 hours) at 10/30/2019 1545 Last data filed at 10/30/2019 0536 Gross per 24 hour  Intake 1560.56 ml  Output --  Net 1560.56 ml   Filed Weights   10/26/19 0858  Weight: 72.8 kg    Exam:  . General: 54 y.o. year-old female pleasant well-developed well-nourished  in no acute distress.  Alert oriented x3.   . Cardiovascular: Regular rate and rhythm no rubs or gallops.  No JVD or thyromegaly noted.   Marland Kitchen Respiratory: Clear to auscultation no wheezes or rales.  Good inspiratory effort. . Abdomen: Soft nontender normal bowel sounds present.   . Musculoskeletal: Improving left upper extremity  significantly edematous with tenderness on palpation from shoulder to fingers.   Marland Kitchen Psychiatry: Pleasant mood is appropriate for condition and setting.  Data Reviewed: CBC: Recent Labs  Lab 10/25/19 1345 10/25/19 2340 10/26/19 0345 10/27/19 0558 10/28/19 0518 10/29/19 1016 10/30/19 0555  WBC 9.1   < > 8.4 8.1 7.1 9.2 7.6  8.0  NEUTROABS 5.8  --   --   --   --  4.5 4.0  HGB 9.9*   < > 8.4* 8.2* 7.9* 8.5* 7.4*  7.4*  HCT 32.7*   < > 28.2* 27.8* 27.3* 28.8* 25.5*  25.0*  MCV 91.6   < > 91.3 91.4 92.9 92.6 92.7  92.3  PLT 294   < > 289 266 285 271 230  228   < > = values in this interval not displayed.   Basic Metabolic Panel: Recent Labs  Lab 10/25/19 1345 10/25/19 2340 10/26/19 0345 10/28/19 0615 10/30/19 0555  NA 139  --  139 141 139  K 3.3*  --  3.6 3.6 3.7  CL 98  --  100 105 106  CO2 27  --  27 23 26   GLUCOSE 124*  --  103* 91 100*  BUN 16  --  15 11 11   CREATININE 0.80 0.73 0.72 0.70 0.64  CALCIUM 10.0  --  9.3 9.3 9.1  MG  --   --   --  2.0  --   PHOS  --   --   --  4.7*  --    GFR: Estimated Creatinine Clearance: 74.2 mL/min (by C-G formula based on SCr of 0.64 mg/dL). Liver Function Tests: Recent Labs  Lab 10/25/19 1345 10/26/19 0345 10/28/19 0615 10/30/19 0555  AST 47* 41 31 40  ALT 74* 61* 49* 51*  ALKPHOS 364* 307* 249* 242*  BILITOT 0.3 0.5 0.8 0.5  PROT 8.0 7.0 6.7 6.2*  ALBUMIN 3.7 3.3* 3.1* 2.8*   No results for input(s): LIPASE, AMYLASE in the last 168 hours. No results for input(s): AMMONIA in the last 168 hours. Coagulation Profile: Recent Labs  Lab 10/29/19 1016  INR 1.1   Cardiac Enzymes: No results for input(s): CKTOTAL, CKMB, CKMBINDEX, TROPONINI in the last 168 hours. BNP (last 3 results) No results for input(s): PROBNP in the last 8760 hours. HbA1C: No results for input(s): HGBA1C in the last 72 hours. CBG: No results for input(s): GLUCAP in the last 168 hours. Lipid Profile: No results for input(s): CHOL, HDL,  LDLCALC, TRIG, CHOLHDL, LDLDIRECT in the last 72 hours. Thyroid Function Tests: No results for input(s): TSH, T4TOTAL, FREET4, T3FREE, THYROIDAB in the last 72 hours. Anemia Panel: No results for input(s): VITAMINB12, FOLATE, FERRITIN, TIBC, IRON, RETICCTPCT in the last 72 hours. Urine analysis:    Component Value Date/Time   COLORURINE LT. YELLOW 08/04/2011 0805   APPEARANCEUR Cloudy 08/04/2011 0805   LABSPEC 1.020 08/04/2011 0805   PHURINE 6.0 08/04/2011 0805   GLUCOSEU NEGATIVE 08/04/2011 0805   HGBUR LARGE 08/04/2011 0805   BILIRUBINUR NEGATIVE 08/04/2011 0805   KETONESUR NEGATIVE 08/04/2011 0805   UROBILINOGEN 0.2 08/04/2011 0805   NITRITE NEGATIVE 08/04/2011 0805   LEUKOCYTESUR NEGATIVE 08/04/2011  0805   Sepsis Labs: @LABRCNTIP (procalcitonin:4,lacticidven:4)  ) Recent Results (from the past 240 hour(s))  SARS CORONAVIRUS 2 (TAT 6-24 HRS) Nasopharyngeal Nasopharyngeal Swab     Status: None   Collection Time: 10/25/19  8:48 PM   Specimen: Nasopharyngeal Swab  Result Value Ref Range Status   SARS Coronavirus 2 NEGATIVE NEGATIVE Final    Comment: (NOTE) SARS-CoV-2 target nucleic acids are NOT DETECTED. The SARS-CoV-2 RNA is generally detectable in upper and lower respiratory specimens during the acute phase of infection. Negative results do not preclude SARS-CoV-2 infection, do not rule out co-infections with other pathogens, and should not be used as the sole basis for treatment or other patient management decisions. Negative results must be combined with clinical observations, patient history, and epidemiological information. The expected result is Negative. Fact Sheet for Patients: SugarRoll.be Fact Sheet for Healthcare Providers: https://www.woods-mathews.com/ This test is not yet approved or cleared by the [place] FDA and  has been authorized for detection and/or diagnosis of SARS-CoV-2 by FDA under an Emergency Use  Authorization (EUA). This EUA will remain  in effect (meaning this test can be used) for the duration of the COVID-19 declaration under Section 56 4(b)(1) of the Act, 21 U.S.C. section 360bbb-3(b)(1), unless the authorization is terminated or revoked sooner. Performed at [facility], [place] [address] [place], [place] [address]       Studies: IR US Guide Bx Asp/Drain  Result Date: 10/29/2019 CLINICAL DATA:  Left-sided breast mass, left axillary lymphadenopathy, chest wall mass and mass within the left lobe of the liver. The patient presents for liver biopsy. EXAM: ULTRASOUND GUIDED CORE BIOPSY OF LIVER MEDICATIONS: 4.0 mg IV Versed; 100 mcg IV Fentanyl Total Moderate Sedation Time: 33 The patient's level of consciousness and physiologic status were continuously monitored during the procedure by Radiology nursing. PROCEDURE: The procedure, risks, benefits, and alternatives were explained to the patient. Questions regarding the procedure were encouraged and answered. The patient understands and consents to the procedure. A time out was performed prior to initiating the procedure. Ultrasound was performed to localize the liver and a lesion in the left lobe. The abdominal wall was prepped with chlorhexidine in a sterile fashion, and a sterile drape was applied covering the operative field. A sterile gown and sterile gloves were used for the procedure. Local anesthesia was provided with 1% Lidocaine. Under ultrasound guidance, a 17 gauge needle was advanced into the left lobe of the liver. Three separate coaxial 18 gauge core biopsy samples were obtained. Samples were submitted in formalin. Gel-Foam pledgets were advanced through the outer needle as the needle was retracted and removed. COMPLICATIONS: None. FINDINGS: Relatively hypoechoic mass within the left lobe of the liver is ill-defined but localized by ultrasound and corresponds to the CT abnormality seen previously. This region measures  approximately 5 cm in diameter by ultrasound. Solid tissue was obtained. IMPRESSION: Ultrasound-guided core biopsy performed of a lesion within the left lobe of the liver measuring approximately 5 cm in diameter by ultrasound. Electronically Signed   By: Aletta Edouard M.D.   On: 10/29/2019 17:20    Scheduled Meds:   Continuous Infusions: . heparin 1,300 Units/hr (10/30/19 0536)     LOS: 5 days     Kayleen Memos, MD [facility] Pager [phone]  If 7PM-7AM, please contact night-coverage [email or web address] Password [facility] 10/30/2019, 3:45 PM

## 2019-10-31 ENCOUNTER — Telehealth: Payer: Self-pay | Admitting: Oncology

## 2019-10-31 LAB — PREPARE RBC (CROSSMATCH)

## 2019-10-31 LAB — CBC
HCT: 25.9 % — ABNORMAL LOW (ref 36.0–46.0)
Hemoglobin: 7.6 g/dL — ABNORMAL LOW (ref 12.0–15.0)
MCH: 27.4 pg (ref 26.0–34.0)
MCHC: 29.3 g/dL — ABNORMAL LOW (ref 30.0–36.0)
MCV: 93.5 fL (ref 80.0–100.0)
Platelets: 235 10*3/uL (ref 150–400)
RBC: 2.77 MIL/uL — ABNORMAL LOW (ref 3.87–5.11)
RDW: 15.1 % (ref 11.5–15.5)
WBC: 8.1 10*3/uL (ref 4.0–10.5)
nRBC: 5.8 % — ABNORMAL HIGH (ref 0.0–0.2)

## 2019-10-31 LAB — HEPARIN LEVEL (UNFRACTIONATED): Heparin Unfractionated: 0.59 IU/mL (ref 0.30–0.70)

## 2019-10-31 LAB — ABO/RH: ABO/RH(D): B POS

## 2019-10-31 MED ORDER — ENOXAPARIN SODIUM 80 MG/0.8ML ~~LOC~~ SOLN: 75.0000 mg | Freq: Two times a day (BID) | SUBCUTANEOUS | 1 refills | Status: DC

## 2019-10-31 MED ORDER — ENOXAPARIN SODIUM 80 MG/0.8ML ~~LOC~~ SOLN
75.0000 mg | Freq: Two times a day (BID) | SUBCUTANEOUS | Status: DC
  Administered 2019-10-31: 11:00:00 75 mg via SUBCUTANEOUS
  Filled 2019-10-31: qty 0.8

## 2019-10-31 MED ORDER — HYDROCODONE-ACETAMINOPHEN 5-325 MG PO TABS: 1.0000 | ORAL_TABLET | Freq: Two times a day (BID) | ORAL | 0 refills | Status: DC | PRN

## 2019-10-31 MED ORDER — SODIUM CHLORIDE 0.9% IV SOLUTION: Freq: Once | INTRAVENOUS | Status: DC

## 2019-10-31 MED ORDER — ENOXAPARIN SODIUM 80 MG/0.8ML ~~LOC~~ SOLN: 75.0000 mg | Freq: Two times a day (BID) | SUBCUTANEOUS | 0 refills | Status: DC

## 2019-10-31 NOTE — Progress Notes (Signed)
Occupational Therapy Treatment Patient Details Name: Shondel Schaak MRN: [id number] DOB: Mar 22, 1966 Today's Date: 10/31/2019    History of present illness Pt is a 54 year old woman with h/o HLD admitted 10/25/19 with worsening pain, weakness and edema of L UE. + for L UE DVT an suspect metastatic breast cancer.   OT comments  Applied L wrist cock up splint, performed strengthening/stretching and encouraged use of LUE throughout day into bil UE activities and using it to pick up small objects. Worked on positioning for edema management and retrograde massage  Follow Up Recommendations  (HHOT if other services, if not OP OT)    Equipment Recommendations  None recommended by OT    Recommendations for Other Services      Precautions / Restrictions Precautions Precaution Comments: L wrist drop Restrictions Weight Bearing Restrictions: No       Mobility Bed Mobility                  Transfers                      Balance                                           ADL either performed or assessed with clinical judgement   ADL                                         General ADL Comments: issued wash mitt yesterday for LUE     Vision       Perception     Praxis      Cognition Arousal/Alertness: Awake/alert Behavior During Therapy: WFL for tasks assessed/performed Overall Cognitive Status: Within Functional Limits for tasks assessed                                          Exercises Other Exercises Other Exercises: AA shoulder as tolerated, decreased ROM throughout. Also decreased elbow extension Other Exercises: positioning, retrograde massage for edema Other Exercises: Pt performed stretch and ROM for fingers, wrist, tends to move too quickly.  Got 10 degrees wrist extension today in gravity eliminated plane   Shoulder Instructions       General Comments decided on wrist cock up splint so that  pt can perform strengthening and stretch throughout day.  Provided extra strapping, in case existing built in straps are too tight at any point with edema.      Pertinent Vitals/ Pain       Pain Assessment: No/denies pain(intermittent throbbing)  Home Living                                          Prior Functioning/Environment              Frequency  Min 2X/week        Progress Toward Goals  OT Goals(current goals can now be found in the care plan section)        Plan      Co-evaluation  AM-PAC OT "6 Clicks" Daily Activity     Outcome Measure   Help from another person eating meals?: A Little Help from another person taking care of personal grooming?: A Little Help from another person toileting, which includes using toliet, bedpan, or urinal?: None Help from another person bathing (including washing, rinsing, drying)?: A Little Help from another person to put on and taking off regular upper body clothing?: A Little Help from another person to put on and taking off regular lower body clothing?: A Little 6 Click Score: 19    End of Session    OT Visit Diagnosis: Muscle weakness (generalized) (M62.81)   Activity Tolerance Patient tolerated treatment well   Patient Left in chair;with call bell/phone within reach   Nurse Communication          Time: [id number] OT Time Calculation (min): 31 min  Charges: OT General Charges $OT Visit: 1 Visit OT Treatments $Therapeutic Activity: 23-37 mins  Anne Sebring S, OTR/L Acute Rehabilitation Services 10/31/2019   [place] 10/31/2019, 9:16 AM

## 2019-10-31 NOTE — Discharge Summary (Signed)
Discharge Summary  Abigail [name] [id number] DOB: September 16, 1966  PCP: Hoyt Koch, MD  Admit date: 10/25/2019 Discharge date: 10/31/2019  Time spent: 35 minutes  Recommendations for Outpatient Follow-up:  1. Please follow-up with oncology Dr. Donneta Romberg 2. Please follow-up with your primary care provider 3. Please take your medications as prescribed 4. Please continue occupational therapy  Discharge Diagnoses:  Active Hospital Problems   Diagnosis Date Noted  . Acute deep vein thrombosis (DVT) of axillary vein of left upper extremity ([place]) 10/19/2019  . Community acquired pneumonia 10/25/2019  . 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 Breast mass, left 10/25/2019  . DVT (deep vein thrombosis) in pregnancy 10/25/2019  . Hypokalemia 10/25/2019    Resolved Hospital Problems  No resolved problems to display.    Discharge Condition: Stable  Diet recommendation: Resume previous diet.  Vitals:   10/31/19 0911 10/31/19 0945  BP: 116/70 109/72  Pulse: 98 (!) 104  Resp: 16 16  Temp: 98.2 F (36.8 C) 98.3 F (36.8 C)  SpO2: 100% 100%    History of present illness:  Patient is a 54 year old female with history of hyperlipidemia who suffered a minor injury on her left shoulder 3 months ago and had been having left arm pain since then. Patient continued to have pain that prompted an upper extremity duplex that showed acute DVT on 10/17/19. Patient was initiated on Eliquis. Continued to have worsening pain, was unable to lift up her wrist so came to the ED for further evaluation. In the emergency room, she had obvious swelling of her left upper extremity.  Hemoglobin 9.9. Covid negative. CT angiogram of the chest showed no evidence of PE, groundglass opacities right upper lobe, conglomerate of lymphadenopathy on the left axilla, mediastinal lymphadenopathy sclerotic and lytic lesions throughout the skeleton as well as soft tissue mass on the left breast. Repeat LUE duplex ultrasound  consistent with extensive acute DVT left subclavian, left axillary, left brachial and left radial and left ulnar veins. No evidence of compartment syndrome, distal arterial supply intact.  She was started on heparin drip.  High suspicion for malignancy therefore oncology was consulted.  Followed by Dr. Benay Spice.  Biopsy of left lobe liver was done on 10/29/19 by interventional radiology.  10/31/19:  Seen and examined.  Persistent but improved left upper extremity swelling with tenderness.  Switched to full dose Lovenox SQ BID.   Hospital Course:  Principal Problem:   Acute deep vein thrombosis (DVT) of axillary vein of left upper extremity (HCC) Active Problems:   Community acquired pneumonia   Radial nerve palsy   Breast mass, left   DVT (deep vein thrombosis) in pregnancy   Hypokalemia  Extensive left upper extremity DVT suspect in the setting of presumed malignancy: Was on Eliquis. Developed new blood clots on Eliquis. Significant LUE swelling and discomfort.  Continue heparin drip for now. Continue pain management PRN.  We will transition to full dose Lovenox prior to discharge as recommended by oncology.  Presumed metastatic breast cancer: Clinical and radiological evidence consistent with metastatic breast cancer with liver and bony lesions.  Significant left upper extremity swelling. Post biopsy by interventional radiology on 10/29/2019. Dr. Benay Spice following.  Highly appreciate assistance. She never had screening mammogram. She never had screening colonoscopy. Will need to be completed outpatient.  Anemia of chronic disease with acute blood loss No overt signs of bleeding Hg 7.6, suspected to be related to suspected metastatic breast cancer Will transfuse 1U PRBC for support prior to discharge  Resolved hypokalemia:  Replaced.  Potassium 3.7.  Community-acquired pneumonia: Suspected.  Ruled out. Procalcitonin and lactic acid negative. Completed course of Rocephin and  azithromycin.   Code Status:Full code    Consultants:  Oncology  Procedures:  Biopsy left lobe liver on 10/29/2019 by interventional radiology.     Discharge Exam: BP 109/72 (BP Location: Left Arm)   Pulse (!) 104   Temp 98.3 F (36.8 C) (Oral)   Resp 16   Ht 5\' 1"  (1.549 m)   Wt 72.8 kg   SpO2 100%   BMI 30.35 kg/m  . General: 54 y.o. year-old female well developed well nourished in no acute distress.  Alert and oriented x3. . Cardiovascular: Regular rate and rhythm with no rubs or gallops.  No thyromegaly or JVD noted.   Marland Kitchen Respiratory: Clear to auscultation with no wheezes or rales. Good inspiratory effort. . Abdomen: Soft nontender nondistended with normal bowel sounds x4 quadrants. . Musculoskeletal: Left upper extremity severely edematous with reduced mobility. Marland Kitchen Psychiatry: Mood is appropriate for condition and setting  Discharge Instructions You were cared for by a hospitalist during your hospital stay. If you have any questions about your discharge medications or the care you received while you were in the hospital after you are discharged, you can call the unit and asked to speak with the hospitalist on call if the hospitalist that took care of you is not available. Once you are discharged, your primary care physician will handle any further medical issues. Please note that NO REFILLS for any discharge medications will be authorized once you are discharged, as it is imperative that you return to your primary care physician (or establish a relationship with a primary care physician if you do not have one) for your aftercare needs so that they can reassess your need for medications and monitor your lab values.   Allergies as of 10/31/2019      Reactions   Shellfish Allergy Itching, Swelling   Sulfamethoxazole    REACTION: swelling      Medication List    STOP taking these medications   acetaminophen 500 MG tablet Commonly known as: TYLENOL     apixaban 5 MG Tabs tablet Commonly known as: Eliquis   gabapentin 300 MG capsule Commonly known as: NEURONTIN     TAKE these medications   diclofenac 75 MG EC tablet Commonly known as: VOLTAREN Take 75 mg by mouth 2 (two) times daily.   enoxaparin 80 MG/0.8ML injection Commonly known as: LOVENOX Inject 0.75 mLs (75 mg total) into the skin every 12 (twelve) hours.   HYDROcodone-acetaminophen 5-325 MG tablet Commonly known as: NORCO/VICODIN Take 1 tablet by mouth 2 (two) times daily as needed for up to 5 days for moderate pain or severe pain. What changed:   how much to take  when to take this  reasons to take this   methocarbamol 500 MG tablet Commonly known as: ROBAXIN Take 500 mg by mouth 4 (four) times daily.      Allergies  Allergen Reactions  . Shellfish Allergy Itching and Swelling  . Sulfamethoxazole     REACTION: swelling   Follow-up Information    Hoyt Koch, MD. Call in 1 day(s).   Specialty: Internal Medicine Why: Please call for a post hospital follow up appointment. Contact information: [place] [address] [phone]        Ladell Pier, MD. Call in 1 day(s).   Specialty: Oncology Why: Please call for a post hospital follow  up appointment. Contact information: [place] [address] [phone]            The results of significant diagnostics from this hospitalization (including imaging, microbiology, ancillary and laboratory) are listed below for reference.    Significant Diagnostic Studies: DG Chest 2 View  Result Date: 10/25/2019 CLINICAL DATA:  Left upper extremity region pain and edema EXAM: CHEST - 2 VIEW COMPARISON:  None. FINDINGS: There is atelectatic change in the left base with small left pleural effusion. There is subtle increased opacity in the right upper lobe which may represent earliest changes of pneumonia. Lungs elsewhere clear. Heart size and pulmonary  vascularity are normal. No adenopathy. No bone lesions. No pneumothorax. There is soft tissue swelling in the visualized left upper extremity. IMPRESSION: 1.  Small left pleural effusion with left base atelectasis. 2. Suspect earliest changes of pneumonia right upper lobe. Question atypical organism pneumonia. 3.  Cardiac silhouette normal. 4.  No adenopathy. 5.  Soft tissue swelling of visualized left upper extremity. Electronically Signed   By: Lowella Grip III M.D.   On: 10/25/2019 14:17   CT Angio Chest PE W/Cm &/Or Wo Cm  Result Date: 10/25/2019 CLINICAL DATA:  Swelling and pain in left upper extremity with blood clot. Left upper back pain. EXAM: CT ANGIOGRAPHY CHEST WITH CONTRAST TECHNIQUE: Multidetector CT imaging of the chest was performed using the standard protocol during bolus administration of intravenous contrast. Multiplanar CT image reconstructions and MIPs were obtained to evaluate the vascular anatomy. CONTRAST:  9mL OMNIPAQUE IOHEXOL 350 MG/ML SOLN COMPARISON:  None. FINDINGS: Cardiovascular: No filling defects in the pulmonary arteries to suggest pulmonary emboli. The given history of a left axillary vein thrombus cannot be visualized on this study. Heart is normal size. Aorta is normal caliber. Mediastinum/Nodes: There is abnormal soft tissue in the left axilla measuring 4.1 x 2.3 cm on image 30. This is presumably conglomerate nodal mass. Borderline sized mediastinal lymph nodes. Prevascular lymph node measures 11 mm. Enlarged right paratracheal lymph node measures 15 mm. Lungs/Pleura: Moderate-sized left pleural effusion. Atelectasis in the left lower lobe. Ground-glass airspace opacity in the posterior right upper lobe could reflect early infiltrate. Scattered small nodules within the lungs. 3 mm nodule in the right upper lobe on image 41. 3 mm nodule in the left upper lobe on image 47. Clustered nodules seen posteriorly in the superior segment of the right lower lobe. Upper Abdomen:  Imaging into the upper abdomen shows no acute findings. Musculoskeletal: Rounded masslike density seen in the left breast measuring 4.2 x 2.9 cm. Recommend correlation with mammography. Mixed lytic and sclerotic lesions diffusely throughout the visualized spine as well as turn Um and manubrium, left scapula, bilateral humeral heads and multiple bilateral ribs compatible with osseous metastatic disease. Review of the MIP images confirms the above findings. IMPRESSION: No evidence of pulmonary embolus. Rounded soft tissue mass in the left breast. Cannot exclude breast cancer given the below findings. Recommend correlation with mammography. Numerous left axillary lymph nodes with conglomerate nodal mass. There is abnormal appearance of the left latissimus Dorsey and muscles surrounding the left scapula concerning for mass/tumor involvement. Mild mediastinal adenopathy. Diffuse sclerotic and lytic lesions throughout the visualized skeleton compatible with osseous metastatic disease. Ground-glass airspace opacity in the superior segment of the right lower lobe with clustered nodular densities. Similar airspace disease posteriorly in the right upper lobe. Cannot exclude areas of early pneumonia. Moderate left pleural effusion. Compressive atelectasis in the left lower lobe. These  results were called by telephone at the time of interpretation on 10/25/2019 at 7:19 pm to provider MARGAUX VENTER , who verbally acknowledged these results. Electronically Signed   By: Rolm Baptise M.D.   On: 10/25/2019 19:19   CT ABDOMEN PELVIS W CONTRAST  Result Date: 10/26/2019 CLINICAL DATA:  Followup chest CT demonstrating metastatic disease and left breast mass EXAM: CT ABDOMEN AND PELVIS WITH CONTRAST TECHNIQUE: Multidetector CT imaging of the abdomen and pelvis was performed using the standard protocol following bolus administration of intravenous contrast. CONTRAST:  135mL OMNIPAQUE IOHEXOL 300 MG/ML  SOLN COMPARISON:  Chest CT from  yesterday. FINDINGS: Lower chest: 4 cm enhancing left breast mass with extensive lymphadenopathy involving the left axilla and tumor involving the left chest wall including the latissimus dorsi and serratus anterior muscles. Left pleural effusion with overlying atelectasis and pulmonary nodules suspicious for pulmonary metastatic disease. Hepatobiliary: Large ill-defined lesion in segment 4 B of the liver worrisome for a metastatic focus. I do not see any other definite liver lesions. The gallbladder is unremarkable. No common bile duct dilatation. The portal and hepatic veins are patent. Pancreas: No mass, inflammation or ductal dilatation. Spleen: Normal size.  No focal lesions. Adrenals/Urinary Tract: The adrenal glands and kidneys are unremarkable. The bladder is normal. Stomach/Bowel: The stomach, duodenum, small bowel and colon are unremarkable. No acute inflammatory changes, mass lesions or obstructive findings. The terminal ileum is normal. The appendix is normal. Moderate stool noted in the mildly distended rectum. Vascular/Lymphatic: The aorta and branch vessels are patent. The major venous structures are patent. No mesenteric or retroperitoneal mass or adenopathy. No pelvic adenopathy. Reproductive: Mildly enlarged fibroid uterus noted. The ovaries appear normal. Other: No free pelvic fluid collections. No inguinal mass or adenopathy. Musculoskeletal: Diffuse osseous metastatic disease with mixed lytic and sclerotic lesions throughout the spine, ribs and pelvis and both hips. No pathologic fracture and no spinal canal compromise. IMPRESSION: 1. Ill-defined 3.5 cm lesion in segment 4B of the liver, likely a metastatic focus. 2. No other abdominal/pelvic metastatic disease identified. 3. Diffuse osseous metastatic disease with mixed lytic and sclerotic process. No pathologic fracture or spinal canal compromise. 4. Left pleural effusion and small metastatic pulmonary nodules. 5. Extensive left breast and  chest wall disease as better seen on recent chest CT. Electronically Signed   By: Marijo Sanes M.D.   On: 10/26/2019 17:01   IR US Guide Bx Asp/Drain  Result Date: 10/29/2019 CLINICAL DATA:  Left-sided breast mass, left axillary lymphadenopathy, chest wall mass and mass within the left lobe of the liver. The patient presents for liver biopsy. EXAM: ULTRASOUND GUIDED CORE BIOPSY OF LIVER MEDICATIONS: 4.0 mg IV Versed; 100 mcg IV Fentanyl Total Moderate Sedation Time: 33 The patient's level of consciousness and physiologic status were continuously monitored during the procedure by Radiology nursing. PROCEDURE: The procedure, risks, benefits, and alternatives were explained to the patient. Questions regarding the procedure were encouraged and answered. The patient understands and consents to the procedure. A time out was performed prior to initiating the procedure. Ultrasound was performed to localize the liver and a lesion in the left lobe. The abdominal wall was prepped with chlorhexidine in a sterile fashion, and a sterile drape was applied covering the operative field. A sterile gown and sterile gloves were used for the procedure. Local anesthesia was provided with 1% Lidocaine. Under ultrasound guidance, a 17 gauge needle was advanced into the left lobe of the liver. Three separate coaxial 18 gauge core biopsy samples  were obtained. Samples were submitted in formalin. Gel-Foam pledgets were advanced through the outer needle as the needle was retracted and removed. COMPLICATIONS: None. FINDINGS: Relatively hypoechoic mass within the left lobe of the liver is ill-defined but localized by ultrasound and corresponds to the CT abnormality seen previously. This region measures approximately 5 cm in diameter by ultrasound. Solid tissue was obtained. IMPRESSION: Ultrasound-guided core biopsy performed of a lesion within the left lobe of the liver measuring approximately 5 cm in diameter by ultrasound. Electronically  Signed   By: Aletta Edouard M.D.   On: 10/29/2019 17:20   UE VENOUS DUPLEX ([facility] & [facility] 7 am - 7 pm)  Result Date: 10/26/2019 UPPER VENOUS STUDY  Indications: Swelling Anticoagulation: Eliquis. Limitations: Body habitus, poor ultrasound/tissue interface and patient pain tolerance. Comparison Study: 10/17/2019-age indeterminate DVT left upper extremity Performing Technologist: Oliver Hum RVT  Examination Guidelines: A complete evaluation includes B-mode imaging, spectral Doppler, color Doppler, and power Doppler as needed of all accessible portions of each vessel. Bilateral testing is considered an integral part of a complete examination. Limited examinations for reoccurring indications may be performed as noted.  Right Findings: +----------+------------+---------+-----------+----------+-------+ RIGHT     CompressiblePhasicitySpontaneousPropertiesSummary +----------+------------+---------+-----------+----------+-------+ Subclavian    Full       Yes       Yes                      +----------+------------+---------+-----------+----------+-------+  Left Findings: +----------+------------+---------+-----------+----------+--------------+ LEFT      CompressiblePhasicitySpontaneousProperties   Summary     +----------+------------+---------+-----------+----------+--------------+ IJV           Full       Yes       Yes                             +----------+------------+---------+-----------+----------+--------------+ Subclavian    None       No        No                   Acute      +----------+------------+---------+-----------+----------+--------------+ Axillary      None       No        No                   Acute      +----------+------------+---------+-----------+----------+--------------+ Brachial      None                 No                   Acute      +----------+------------+---------+-----------+----------+--------------+ Radial        None                                       Acute      +----------+------------+---------+-----------+----------+--------------+ Ulnar         None                                      Acute      +----------+------------+---------+-----------+----------+--------------+ Cephalic      Full                                                 +----------+------------+---------+-----------+----------+--------------+  Basilic                                             Not visualized +----------+------------+---------+-----------+----------+--------------+  Summary:  Right: No evidence of thrombosis in the subclavian.  Left: No evidence of superficial vein thrombosis in the upper extremity. Findings consistent with acute deep vein thrombosis involving the left subclavian vein, left axillary vein, left brachial veins, left radial veins and left ulnar veins. Unable to visualize basilic vein.  *See table(s) above for measurements and observations.  Diagnosing physician: Monica Martinez MD Electronically signed by Monica Martinez MD on 10/26/2019 at 3:53:02 PM.    Final    VAS [place] UPPER EXTREMITY VENOUS DUPLEX  Result Date: 10/17/2019 UPPER VENOUS STUDY  Indications: Edema, and intermittent left arm pain and swelling since end of 06/2019. Patient denies any trauma, SOB or chest pain. Risk Factors: None identified Patient had difficulty with keeping arm bent or straight during exam due to pain. Anticoagulation: None. Comparison Study: None Performing Technologist: Alecia Mackin RVT, RDCS (AE), RDMS  Examination Guidelines: A complete evaluation includes B-mode imaging, spectral Doppler, color Doppler, and power Doppler as needed of all accessible portions of each vessel. Bilateral testing is considered an integral part of a complete examination. Limited examinations for reoccurring indications may be performed as noted.  Left Findings: +----------+------------+---------+-----------+---------------+----------------+ LEFT       CompressiblePhasicitySpontaneous  Properties       Summary      +----------+------------+---------+-----------+---------------+----------------+ IJV           Full       Yes       Yes                                    +----------+------------+---------+-----------+---------------+----------------+ Subclavian               No        No                     vein not well                                                            seen proximal to                                                             mid chest.    +----------+------------+---------+-----------+---------------+----------------+ Axillary    Partial      No        No          rigid           Age                                                   w/compression  Indeterminate   +----------+------------+---------+-----------+---------------+----------------+ Brachial    Partial      No        No         dilated          Age                                                                  Indeterminate   +----------+------------+---------+-----------+---------------+----------------+ Radial        Full       Yes       Yes                                    +----------+------------+---------+-----------+---------------+----------------+ Ulnar         Full       Yes       Yes                                    +----------+------------+---------+-----------+---------------+----------------+ Cephalic      Full       Yes       Yes                                    +----------+------------+---------+-----------+---------------+----------------+ Basilic       Full       Yes       Yes                                    +----------+------------+---------+-----------+---------------+----------------+ Collaterals present along course of brachial veins. The medial paired brachial vein appears almost occluded from axiallry down to antecubital fossa. Findings reported to Dr.  Netta Cedars at 11:45 am.  Summary:  Left: No evidence of superficial vein thrombosis in the upper extremity. Findings consistent with age indeterminate non-occlusive deep vein thrombosis involving the left subclavian vein, left axillary vein and left brachial veins.  *See table(s) above for measurements and observations.  Diagnosing physician: Kathlyn Sacramento MD Electronically signed by Kathlyn Sacramento MD on 10/17/2019 at 12:36:35 PM.    Final     Microbiology: Recent Results (from the past 240 hour(s))  SARS CORONAVIRUS 2 (TAT 6-24 HRS) Nasopharyngeal Nasopharyngeal Swab     Status: None   Collection Time: 10/25/19  8:48 PM   Specimen: Nasopharyngeal Swab  Result Value Ref Range Status   SARS Coronavirus 2 NEGATIVE NEGATIVE Final    Comment: (NOTE) SARS-CoV-2 target nucleic acids are NOT DETECTED. The SARS-CoV-2 RNA is generally detectable in upper and lower respiratory specimens during the acute phase of infection. Negative results do not preclude SARS-CoV-2 infection, do not rule out co-infections with other pathogens, and should not be used as the sole basis for treatment or other patient management decisions. Negative results must be combined with clinical observations, patient history, and epidemiological information. The expected result is Negative. Fact Sheet for Patients: SugarRoll.be Fact Sheet for Healthcare Providers: https://www.woods-mathews.com/ This test is not yet approved or cleared by the [place] FDA and  has been authorized for detection and/or diagnosis of SARS-CoV-2 by FDA under an Emergency Use Authorization (EUA). This EUA will remain  in effect (meaning this test can be used) for the duration of the COVID-19 declaration under Section 56 4(b)(1) of the Act, 21 U.S.C. section 360bbb-3(b)(1), unless the authorization is terminated or revoked sooner. Performed at [facility], [place] [address], [place],  [place] [address]      Labs: Basic Metabolic Panel: Recent Labs  Lab 10/25/19 1345 10/25/19 2340 10/26/19 0345 10/28/19 0615 10/30/19 0555  NA 139  --  139 141 139  K 3.3*  --  3.6 3.6 3.7  CL 98  --  100 105 106  CO2 27  --  27 23 26   GLUCOSE 124*  --  103* 91 100*  BUN 16  --  15 11 11   CREATININE 0.80 0.73 0.72 0.70 0.64  CALCIUM 10.0  --  9.3 9.3 9.1  MG  --   --   --  2.0  --   PHOS  --   --   --  4.7*  --    Liver Function Tests: Recent Labs  Lab 10/25/19 1345 10/26/19 0345 10/28/19 0615 10/30/19 0555  AST 47* 41 31 40  ALT 74* 61* 49* 51*  ALKPHOS 364* 307* 249* 242*  BILITOT 0.3 0.5 0.8 0.5  PROT 8.0 7.0 6.7 6.2*  ALBUMIN 3.7 3.3* 3.1* 2.8*   No results for input(s): LIPASE, AMYLASE in the last 168 hours. No results for input(s): AMMONIA in the last 168 hours. CBC: Recent Labs  Lab 10/25/19 1345 10/25/19 2340 10/27/19 0558 10/28/19 0518 10/29/19 1016 10/30/19 0555 10/31/19 0402  WBC 9.1   < > 8.1 7.1 9.2 7.6  8.0 8.1  NEUTROABS 5.8  --   --   --  4.5 4.0  --   HGB 9.9*   < > 8.2* 7.9* 8.5* 7.4*  7.4* 7.6*  HCT 32.7*   < > 27.8* 27.3* 28.8* 25.5*  25.0* 25.9*  MCV 91.6   < > 91.4 92.9 92.6 92.7  92.3 93.5  PLT 294   < > 266 285 271 230  228 235   < > = values in this interval not displayed.   Cardiac Enzymes: No results for input(s): CKTOTAL, CKMB, CKMBINDEX, TROPONINI in the last 168 hours. BNP: BNP (last 3 results) No results for input(s): BNP in the last 8760 hours.  ProBNP (last 3 results) No results for input(s): PROBNP in the last 8760 hours.  CBG: No results for input(s): GLUCAP in the last 168 hours.     Signed:  Kayleen Memos, MD [facility] 10/31/2019, 12:29 PM

## 2019-10-31 NOTE — Care Plan (Signed)
Patien't dc'd today. I provided teaching regarding proper injection/administration of RX Lovenox. Patient verbalized understanding of teaching. Patient is AOX4

## 2019-10-31 NOTE — TOC Initial Note (Signed)
Transition of Care [facility]) - Initial/Assessment Note    Patient Details  Name: Abigail [name] MRN: [id number] Date of Birth: Nov 22, 1965  [facility] CM/SW Contact:    Muzamil Harker, Marjie Skiff, RN Phone Number: 10/31/2019, 12:29 PM  Clinical Narrative:                 Patient to learn to give self Lovenox injections here in hospital and states she has someone that lives with her that can help her as well. Alvis Lemmings can provide HHPT/OT/Aide  Expected Discharge Plan: [place] Barriers to Discharge: No Barriers Identified   Patient Goals and CMS Choice     Choice offered to / list presented to : Patient  Expected Discharge Plan and Services Expected Discharge Plan: [place]   Discharge Planning Services: CM Consult Post Acute Care Choice: [place] arrangements for the past 2 months: Single Family Home Expected Discharge Date: 10/31/19                         HH Arranged: PT, OT, Nurse's Aide HH Agency: [place] [facility] Agency Contacted: 10/31/19 Time HH Agency Contacted: [id number] Representative spoke with at [place]: Tommi Rumps  Prior Living Arrangements/Services Living arrangements for the past 2 months: Single Family Home Lives with:: Other (Comment) Patient language and need for interpreter reviewed:: Yes Do you feel safe going back to the place where you live?: Yes      Need for Family Participation in Patient Care: Yes (Comment) Care giver support system in place?: Yes (comment)      Activities of Daily Living Home Assistive Devices/Equipment: None ADL Screening (condition at time of admission) Patient's cognitive ability adequate to safely complete daily activities?: Yes Is the patient deaf or have difficulty hearing?: No Does the patient have difficulty seeing, even when wearing glasses/contacts?: No Does the patient have difficulty concentrating, remembering, or making decisions?: No Patient able to  express need for assistance with ADLs?: Yes Does the patient have difficulty dressing or bathing?: No Independently performs ADLs?: Yes (appropriate for developmental age) Does the patient have difficulty walking or climbing stairs?: No Weakness of Legs: None Weakness of Arms/Hands: Left(left wrist)  Permission Sought/Granted   Permission granted to share information with : Yes, Verbal Permission Granted     Permission granted to share info w AGENCY: [facility]        Emotional Assessment       Orientation: : Oriented to Self, Oriented to Place, Oriented to  Time, Oriented to Situation      Admission diagnosis:  Pain [R52] DVT (deep vein thrombosis) in pregnancy [O22.30] Left arm swelling [M79.89] Acute deep vein thrombosis (DVT) of left upper extremity, unspecified vein ([place]) [I82.622] Community acquired pneumonia of right lung, unspecified part of lung [J18.9] Patient Active Problem List   Diagnosis Date Noted  . Community acquired pneumonia 10/25/2019  . 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 Breast mass, left 10/25/2019  . DVT (deep vein thrombosis) in pregnancy 10/25/2019  . Hypokalemia 10/25/2019  . Acute deep vein thrombosis (DVT) of axillary vein of left upper extremity ([place]) 10/19/2019  . Left arm pain 09/10/2019  . Routine health maintenance 08/04/2011  . Borderline hyperlipidemia 06/05/2010   PCP:  Hoyt Koch, MD Pharmacy:   [id number] - [place], [address] [place] [address] Phone: [phone] Fax: [phone]  Social Determinants of Health (SDOH) Interventions    Readmission Risk Interventions No flowsheet data found.

## 2019-10-31 NOTE — Progress Notes (Addendum)
   10/31/19 0918  OT Visit Information  Last OT Received On 10/31/19  Assistance Needed +1  History of Present Illness Pt is a 54 year old woman with h/o HLD admitted 10/25/19 with worsening pain, weakness and edema of L UE. + for L UE DVT an suspect metastatic breast cancer.  Precautions  Precaution Comments L wrist drop  Pain Assessment  Pain Assessment No/denies pain  Cognition  Arousal/Alertness Awake/alert  Behavior During Therapy WFL for tasks assessed/performed  Overall Cognitive Status Within Functional Limits for tasks assessed  ADL  Eating/Feeding Independent  General ADL Comments encouraged pt to use L as stabilizer for food, and she managed set up herself.  Talked about compensations for doing hair, as she cannot do this yet, sequence for dressing and bra in front (which she has)    Restrictions  Weight Bearing Restrictions No  General Comments  General comments (skin integrity, edema, etc.) tolerated splint x 1 hour without difficulties. Educated to wear 2 hours on then give herself a break.  Also educated to look at skin and if "indented" wait for this to resolve before reapplying. As splint is one size, cut additional straps to use in place of attached straps if needed.  Provided written HEP for strengthening, stretch, and use of LUE during day, which we reviewed  OT - End of Session  Activity Tolerance Patient tolerated treatment well  Patient left in chair;with call bell/phone within reach  OT Assessment/Plan  OT Visit Diagnosis Muscle weakness (generalized) (M62.81)  OT Frequency (ACUTE ONLY) Min 2X/week  Follow Up Recommendations  (HHOT if other services otherwise OP OT).  Pt states MD is providing [place] services  OT Equipment None recommended by OT  AM-PAC OT "6 Clicks" Daily Activity Outcome Measure (Version 2)  Help from another person eating meals? 3  Help from another person taking care of personal grooming? 3  Help from another person toileting, which includes  using toliet, bedpan, or urinal? 4  Help from another person bathing (including washing, rinsing, drying)? 3  Help from another person to put on and taking off regular upper body clothing? 3  Help from another person to put on and taking off regular lower body clothing? 3  6 Click Score 19  OT Goal Progression  Progress towards OT goals Progressing toward goals  OT Time Calculation  OT Start Time (ACUTE ONLY) 0900  OT Stop Time (ACUTE ONLY) 0909  OT Time Calculation (min) 9 min  OT General Charges  $OT Visit 1 Visit  OT Treatments  $Therapeutic Activity 8-22 mins  Timoty Bourke S, OTR/L Acute Rehabilitation Services 10/31/2019

## 2019-10-31 NOTE — Telephone Encounter (Signed)
Scheduled appt per 1/20 sch message - pt aware of appt date and time   

## 2019-10-31 NOTE — Progress Notes (Signed)
ANTICOAGULATION CONSULT NOTE - Follow Up Consult  Pharmacy Consult for Heparin Indication: Acute UE DVT  Allergies  Allergen Reactions  . Shellfish Allergy Itching and Swelling  . Sulfamethoxazole     REACTION: swelling    Patient Measurements: Height: 5\' 1"  (154.9 cm) Weight: 160 lb 9.6 oz (72.8 kg) IBW/kg (Calculated) : 47.8 Heparin Dosing Weight: 64 kg  Vital Signs: Temp: 98.1 F (36.7 C) (01/20 0551) Temp Source: Oral (01/20 0551) BP: 97/67 (01/20 0551) Pulse Rate: 98 (01/20 0551)  Labs: Recent Labs    10/28/19 0615 10/28/19 1420 10/28/19 2114 10/28/19 2114 10/29/19 0738 10/29/19 0738 10/29/19 1016 10/30/19 0555 10/30/19 1138 10/31/19 0402  HGB  --   --   --   --   --    < > 8.5* 7.4*  7.4*  --  7.6*  HCT  --   --   --   --   --   --  28.8* 25.5*  25.0*  --  25.9*  PLT  --   --   --   --   --   --  271 230  228  --  235  APTT  --    < > 52*  --  109*  --   --  66*  --   --   LABPROT  --   --   --   --   --   --  14.4  --   --   --   INR  --   --   --   --   --   --  1.1  --   --   --   HEPARINUNFRC  --    < > 0.84*   < > 1.07*   < >  --  0.55 0.55 0.59  CREATININE 0.70  --   --   --   --   --   --  0.64  --   --    < > = values in this interval not displayed.    Estimated Creatinine Clearance: 74.2 mL/min (by C-G formula based on SCr of 0.64 mg/dL).   Medications:   Assessment: Patient on heparin infusion for history of VTE. Pt recently started on apixaban PTA for DVT diagnosed on 10/17/19. Pt presented to ED on 1/14 with left arm swelling/pain. UE doppler on 1/14 showed acute left upper extremity DVT. Anticoagulation converted to heparin infusion while inpatient.   1/19  HL = 0.55, aPTT = 66 seconds correlating within therapeutic range on heparin infusion of 1300 units/hr  Confirmed with RN that heparin infusing at correct rate. No signs/symptoms of bleeding.  CBC: Hgb (7.4) - low and decreased. No bleeding noted. Plt - WNL and stable Today,  1/20  0402 HL = 0.59 at goal. MD now wants to transition over to Lovenox.  Goal of Therapy:  Heparin level 0.3-0.7 units/ml aPTT 66-102 seconds Monitor platelets by anticoagulation protocol: Yes   Plan:   Stop Heparin drip at 0800 today  At 0900 Give 1st dose of Lovenox 75 mg sq q12h  D/c DHL  CBC daily  Monitor for signs/symptoms of bleeding  Dorrene German, PharmD 10/31/2019,5:57 AM

## 2019-10-31 NOTE — Discharge Instructions (Signed)

## 2019-11-01 LAB — TYPE AND SCREEN
ABO/RH(D): B POS
Antibody Screen: NEGATIVE
Unit division: 0

## 2019-11-01 LAB — BPAM RBC
Blood Product Expiration Date: 202102122359
ISSUE DATE / TIME: 202101200853
Unit Type and Rh: 7300

## 2019-11-01 LAB — CANCER ANTIGEN 27.29: CA 27.29: 49.8 U/mL — ABNORMAL HIGH (ref 0.0–38.6)

## 2019-11-02 ENCOUNTER — Other Ambulatory Visit: Payer: Self-pay | Admitting: *Deleted

## 2019-11-02 ENCOUNTER — Inpatient Hospital Stay: Payer: Managed Care, Other (non HMO) | Attending: Oncology | Admitting: Oncology

## 2019-11-02 ENCOUNTER — Other Ambulatory Visit: Payer: Self-pay | Admitting: Nurse Practitioner

## 2019-11-02 ENCOUNTER — Other Ambulatory Visit: Payer: Self-pay

## 2019-11-02 VITALS — BP 128/89 | HR 118 | Temp 98.3°F | Resp 18 | Ht 61.0 in | Wt 163.9 lb

## 2019-11-02 DIAGNOSIS — D649 Anemia, unspecified: Secondary | ICD-10-CM | POA: Insufficient documentation

## 2019-11-02 DIAGNOSIS — C50919 Malignant neoplasm of unspecified site of unspecified female breast: Secondary | ICD-10-CM | POA: Insufficient documentation

## 2019-11-02 DIAGNOSIS — C50412 Malignant neoplasm of upper-outer quadrant of left female breast: Secondary | ICD-10-CM

## 2019-11-02 DIAGNOSIS — Z171 Estrogen receptor negative status [ER-]: Secondary | ICD-10-CM | POA: Insufficient documentation

## 2019-11-02 DIAGNOSIS — C787 Secondary malignant neoplasm of liver and intrahepatic bile duct: Secondary | ICD-10-CM | POA: Diagnosis not present

## 2019-11-02 DIAGNOSIS — J90 Pleural effusion, not elsewhere classified: Secondary | ICD-10-CM | POA: Diagnosis not present

## 2019-11-02 DIAGNOSIS — R0609 Other forms of dyspnea: Secondary | ICD-10-CM | POA: Insufficient documentation

## 2019-11-02 DIAGNOSIS — C7989 Secondary malignant neoplasm of other specified sites: Secondary | ICD-10-CM | POA: Insufficient documentation

## 2019-11-02 DIAGNOSIS — I82622 Acute embolism and thrombosis of deep veins of left upper extremity: Secondary | ICD-10-CM | POA: Insufficient documentation

## 2019-11-02 DIAGNOSIS — C7951 Secondary malignant neoplasm of bone: Secondary | ICD-10-CM | POA: Insufficient documentation

## 2019-11-02 DIAGNOSIS — N6320 Unspecified lump in the left breast, unspecified quadrant: Secondary | ICD-10-CM

## 2019-11-02 DIAGNOSIS — Z7189 Other specified counseling: Secondary | ICD-10-CM | POA: Diagnosis not present

## 2019-11-02 DIAGNOSIS — M545 Low back pain: Secondary | ICD-10-CM | POA: Diagnosis not present

## 2019-11-02 DIAGNOSIS — C50912 Malignant neoplasm of unspecified site of left female breast: Secondary | ICD-10-CM | POA: Diagnosis not present

## 2019-11-02 LAB — SURGICAL PATHOLOGY

## 2019-11-02 MED ORDER — HYDROCODONE-ACETAMINOPHEN 5-325 MG PO TABS: 1.0000 | ORAL_TABLET | Freq: Three times a day (TID) | ORAL | 0 refills | Status: DC | PRN

## 2019-11-02 MED ORDER — DEXAMETHASONE 2 MG PO TABS: 10.0000 mg | ORAL_TABLET | ORAL | 0 refills | Status: DC

## 2019-11-02 MED ORDER — LIDOCAINE-PRILOCAINE 2.5-2.5 % EX CREA: 1.0000 "application " | TOPICAL_CREAM | CUTANEOUS | 5 refills | Status: DC

## 2019-11-02 MED ORDER — ONDANSETRON HCL 8 MG PO TABS: 8.0000 mg | ORAL_TABLET | Freq: Three times a day (TID) | ORAL | 1 refills | Status: DC | PRN

## 2019-11-02 MED ORDER — PROCHLORPERAZINE MALEATE 10 MG PO TABS: 10.0000 mg | ORAL_TABLET | Freq: Four times a day (QID) | ORAL | 1 refills | Status: DC | PRN

## 2019-11-02 NOTE — Progress Notes (Signed)
Scheduled port for 1/27 at 2:30 pm w/arrival to [facility] radiology at 1 pm. NPO after 0800 and last Lovenox dose will be on 11/05/19 pm per IR instructions. Will need a driver. Does not require repeat COVID test. Patient notified before leaving office. Scripts for Zofran, Decadron, EMLA and Compazine sent. Notified managed care of need for PA-tx planned for 11/08/19. Notified [facility] she will be in infusion on 1/28 and requested they meet with her.

## 2019-11-02 NOTE — Progress Notes (Signed)
START OFF PATHWAY REGIMEN - Breast   [id number]:Carboplatin + Paclitaxel (5/200) q21d:   A cycle is every 21 days:     Paclitaxel      Carboplatin   **Always confirm dose/schedule in your pharmacy ordering system**  Patient Characteristics: Distant Metastases or Locoregional Recurrent Disease - Unresected or Locally Advanced Unresectable Disease Progressing after Neoadjuvant and Local Therapies, HER2 Negative/Unknown/Equivocal, ER Negative/Unknown, Chemotherapy, First Line, ER Negative,  PD-L1 Expression Negative/Unknown Therapeutic Status: Distant Metastases BRCA Mutation Status: Awaiting Test Results ER Status: Negative (-) HER2 Status: Negative (-) PR Status: Negative (-) Line of Therapy: First Line PD-L1 Expression Status: Awaiting Test Results Intent of Therapy: Non-Curative / Palliative Intent, Discussed with Patient

## 2019-11-02 NOTE — Progress Notes (Signed)
[facility] OFFICE PROGRESS NOTE   Diagnosis: Breast cancer  INTERVAL HISTORY:   Abigail [name] was discharged from the hospital on 10/31/2019.  She continues to have swelling in the left arm.  She has mild exertional dyspnea.  She has pain in the lower back, relieved with hydrocodone.  [name] is taking twice daily.  Objective:  Vital signs in last 24 hours:  Blood pressure 128/89, pulse (!) 118, temperature 98.3 F (36.8 C), temperature source Temporal, resp. rate 18, height '5\' 1"'  (1.549 m), weight 163 lb 14.4 oz (74.3 kg), SpO2 98 %.    Lymphatics: Less than 1 cm right supraclavicular node Resp: Decreased breath sounds at the left lower posterior chest, no respiratory distress Cardio: Regular rate and rhythm GI: No hepatomegaly Vascular: No leg edema, edema throughout the left arm and hand Breast: Firm mass at the left breast with edema and skin thickening of the left breast, less than 1 nodule in the medial left axilla      Lab Results:  Lab Results  Component Value Date   WBC 8.1 10/31/2019   HGB 7.6 (L) 10/31/2019   HCT 25.9 (L) 10/31/2019   MCV 93.5 10/31/2019   PLT 235 10/31/2019   NEUTROABS 4.0 10/30/2019    CMP  Lab Results  Component Value Date   NA 139 10/30/2019   K 3.7 10/30/2019   CL 106 10/30/2019   CO2 26 10/30/2019   GLUCOSE 100 (H) 10/30/2019   BUN 11 10/30/2019   CREATININE 0.64 10/30/2019   CALCIUM 9.1 10/30/2019   PROT 6.2 (L) 10/30/2019   ALBUMIN 2.8 (L) 10/30/2019   AST 40 10/30/2019   ALT 51 (H) 10/30/2019   ALKPHOS 242 (H) 10/30/2019   BILITOT 0.5 10/30/2019   GFRNONAA >60 10/30/2019   GFRAA >60 10/30/2019    No results found for: CEA1  Lab Results  Component Value Date   INR 1.1 10/29/2019    Imaging:  IR US Guide Bx Asp/Drain  Result Date: 10/29/2019 CLINICAL DATA:  Left-sided breast mass, left axillary lymphadenopathy, chest wall mass and mass within the left lobe of the liver. The patient presents  for liver biopsy. EXAM: ULTRASOUND GUIDED CORE BIOPSY OF LIVER MEDICATIONS: 4.0 mg IV Versed; 100 mcg IV Fentanyl Total Moderate Sedation Time: 33 The patient's level of consciousness and physiologic status were continuously monitored during the procedure by Radiology nursing. PROCEDURE: The procedure, risks, benefits, and alternatives were explained to the patient. Questions regarding the procedure were encouraged and answered. The patient understands and consents to the procedure. A time out was performed prior to initiating the procedure. Ultrasound was performed to localize the liver and a lesion in the left lobe. The abdominal wall was prepped with chlorhexidine in a sterile fashion, and a sterile drape was applied covering the operative field. A sterile gown and sterile gloves were used for the procedure. Local anesthesia was provided with 1% Lidocaine. Under ultrasound guidance, a 17 gauge needle was advanced into the left lobe of the liver. Three separate coaxial 18 gauge core biopsy samples were obtained. Samples were submitted in formalin. Gel-Foam pledgets were advanced through the outer needle as the needle was retracted and removed. COMPLICATIONS: None. FINDINGS: Relatively hypoechoic mass within the left lobe of the liver is ill-defined but localized by ultrasound and corresponds to the CT abnormality seen previously. This region measures approximately 5 cm in diameter by ultrasound. Solid tissue was obtained. IMPRESSION: Ultrasound-guided core biopsy performed of a lesion within the  left lobe of the liver measuring approximately 5 cm in diameter by ultrasound. Electronically Signed   By: Aletta Edouard M.D.   On: 10/29/2019 17:20    Medications: I have reviewed the patient's current medications.   Assessment/Plan: 1.  Metastatic breast cancer  CT chest 10/24/2018-left breast mass left axillary lymph nodes, abnormal appearance of the left concerning for tumor involvement, mediastinal  adenopathy, diffuse sclerotic and lytic lesions, left pleural effusion  CT abdomen/pelvis 10/25/1998 enhancing left breast mass with left axillary lymphadenopathy and tumor involved the left chest wall the latissimus dorsi segment 4B liver lesion, diffuse lytic/sclerotic lesions  Ultrasound-guided biopsy of left liver lesion 10/29/1999-metastatic carcinoma, cytokeratin and GATA3 positive consistent with metastatic breast cancer.  HER-2 negative, ER negative, PR negative, Ki-67 20% 2.  Left upper extremity DVT-left subclavian, axillary, and brachial vein thrombosis confirmed on  Anticoagulation with apixaban, changed to parenteral hospital admission 10/25/2019 transition to twice daily Lovenox at discharge 3.    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   Left pleural effusion    Disposition: [name] appears stable.  She has been diagnosed with metastatic breast cancer.  She has clinical evidence of inflammatory left-sided breast cancer with a locally advanced left breast mass involving the chest wall, local adenopathy effusion, liver and metastases.  She has been diagnosed with triple negative metastatic breast cancer.  She understands no therapy will be curative.  We discussed treatment options.  I recommend beginning treatment with systemic chemotherapy.  We will submit tumor for PD-L1 and BRCA testing.  We considered treatment with single agent chemotherapy versus therapy.  I favor combined therapy in her case due to the bulk and extent of disease.  We will consider adding a PD1 inhibitor based on the combined positive score.  I recommend treatment with Taxol/carboplatin.  We reviewed potential toxicities associated with this regimen including the chance for nausea/vomiting, alopecia, and hematologic toxicity.  We discussed the allergic reaction, bone pain, and neuropathy associated with Taxol.  We discussed the nausea associated with carboplatin.  We  discussed the potential need for G-CSF support.  She agrees to proceed.  She will attend chemo teaching class.  [name] will be referred for Port-A-Cath placement 11/07/2019.  The plan is to begin chemotherapy in 2021. Betsy Coder, MD  11/02/2019  2:28 PM

## 2019-11-05 ENCOUNTER — Inpatient Hospital Stay: Payer: Managed Care, Other (non HMO)

## 2019-11-05 ENCOUNTER — Other Ambulatory Visit: Payer: Self-pay

## 2019-11-05 DIAGNOSIS — C50912 Malignant neoplasm of unspecified site of left female breast: Secondary | ICD-10-CM | POA: Diagnosis not present

## 2019-11-05 DIAGNOSIS — C50412 Malignant neoplasm of upper-outer quadrant of left female breast: Secondary | ICD-10-CM

## 2019-11-05 LAB — CMP (CANCER CENTER ONLY)
ALT: 61 U/L — ABNORMAL HIGH (ref 0–44)
AST: 44 U/L — ABNORMAL HIGH (ref 15–41)
Albumin: 3.1 g/dL — ABNORMAL LOW (ref 3.5–5.0)
Alkaline Phosphatase: 250 U/L — ABNORMAL HIGH (ref 38–126)
Anion gap: 12 (ref 5–15)
BUN: 15 mg/dL (ref 6–20)
CO2: 30 mmol/L (ref 22–32)
Calcium: 9.5 mg/dL (ref 8.9–10.3)
Chloride: 102 mmol/L (ref 98–111)
Creatinine: 0.8 mg/dL (ref 0.44–1.00)
GFR, Est AFR Am: >60 mL/min (ref >60)
GFR, Estimated: >60 mL/min (ref >60)
Glucose, Bld: 108 mg/dL — ABNORMAL HIGH (ref 70–99)
Potassium: 3.7 mmol/L (ref 3.5–5.1)
Sodium: 144 mmol/L (ref 135–145)
Total Bilirubin: 0.3 mg/dL (ref 0.3–1.2)
Total Protein: 7.2 g/dL (ref 6.5–8.1)

## 2019-11-05 LAB — CBC WITH DIFFERENTIAL (CANCER CENTER ONLY)
Abs Immature Granulocytes: 0.4 10*3/uL — ABNORMAL HIGH (ref 0.00–0.07)
Basophils Absolute: 0 10*3/uL (ref 0.0–0.1)
Basophils Relative: 1 %
Eosinophils Absolute: 0.4 10*3/uL (ref 0.0–0.5)
Eosinophils Relative: 5 %
HCT: 33.1 % — ABNORMAL LOW (ref 36.0–46.0)
Hemoglobin: 9.9 g/dL — ABNORMAL LOW (ref 12.0–15.0)
Immature Granulocytes: 5 %
Lymphocytes Relative: 26 %
Lymphs Abs: 2 10*3/uL (ref 0.7–4.0)
MCH: 27.7 pg (ref 26.0–34.0)
MCHC: 29.9 g/dL — ABNORMAL LOW (ref 30.0–36.0)
MCV: 92.5 fL (ref 80.0–100.0)
Monocytes Absolute: 1.1 10*3/uL — ABNORMAL HIGH (ref 0.1–1.0)
Monocytes Relative: 14 %
Neutro Abs: 3.9 10*3/uL (ref 1.7–7.7)
Neutrophils Relative %: 49 %
Platelet Count: 223 10*3/uL (ref 150–400)
RBC: 3.58 MIL/uL — ABNORMAL LOW (ref 3.87–5.11)
RDW: 15.5 % (ref 11.5–15.5)
WBC Count: 7.9 10*3/uL (ref 4.0–10.5)
nRBC: 8.3 % — ABNORMAL HIGH (ref 0.0–0.2)

## 2019-11-06 ENCOUNTER — Other Ambulatory Visit: Payer: Self-pay | Admitting: Radiology

## 2019-11-07 ENCOUNTER — Ambulatory Visit (HOSPITAL_COMMUNITY)
Admission: RE | Admit: 2019-11-07 | Discharge: 2019-11-07 | Disposition: A | Discharge status: Home / Self Care | Payer: Managed Care, Other (non HMO) | Source: Ambulatory Visit | Attending: Oncology | Admitting: Oncology

## 2019-11-07 ENCOUNTER — Other Ambulatory Visit: Payer: Self-pay | Admitting: Oncology

## 2019-11-07 ENCOUNTER — Encounter (HOSPITAL_COMMUNITY): Payer: Self-pay

## 2019-11-07 ENCOUNTER — Telehealth: Payer: Self-pay | Admitting: *Deleted

## 2019-11-07 ENCOUNTER — Encounter: Payer: Self-pay | Admitting: Pharmacist

## 2019-11-07 ENCOUNTER — Encounter: Payer: Self-pay | Admitting: Oncology

## 2019-11-07 ENCOUNTER — Other Ambulatory Visit: Payer: Self-pay | Admitting: Genetic Counselor

## 2019-11-07 ENCOUNTER — Other Ambulatory Visit: Payer: Self-pay

## 2019-11-07 ENCOUNTER — Other Ambulatory Visit: Payer: Self-pay | Admitting: *Deleted

## 2019-11-07 ENCOUNTER — Inpatient Hospital Stay: Payer: Managed Care, Other (non HMO)

## 2019-11-07 DIAGNOSIS — Z79899 Other long term (current) drug therapy: Secondary | ICD-10-CM | POA: Insufficient documentation

## 2019-11-07 DIAGNOSIS — C50412 Malignant neoplasm of upper-outer quadrant of left female breast: Secondary | ICD-10-CM | POA: Insufficient documentation

## 2019-11-07 DIAGNOSIS — Z171 Estrogen receptor negative status [ER-]: Secondary | ICD-10-CM | POA: Insufficient documentation

## 2019-11-07 DIAGNOSIS — I82622 Acute embolism and thrombosis of deep veins of left upper extremity: Secondary | ICD-10-CM | POA: Diagnosis not present

## 2019-11-07 DIAGNOSIS — Z882 Allergy status to sulfonamides status: Secondary | ICD-10-CM | POA: Insufficient documentation

## 2019-11-07 DIAGNOSIS — E785 Hyperlipidemia, unspecified: Secondary | ICD-10-CM | POA: Insufficient documentation

## 2019-11-07 HISTORY — PX: IR IMAGING GUIDED PORT INSERTION: IMG5740

## 2019-11-07 LAB — CBC WITH DIFFERENTIAL/PLATELET
Abs Immature Granulocytes: 0.48 10*3/uL — ABNORMAL HIGH (ref 0.00–0.07)
Basophils Absolute: 0.1 10*3/uL (ref 0.0–0.1)
Basophils Relative: 1 %
Eosinophils Absolute: 0.4 10*3/uL (ref 0.0–0.5)
Eosinophils Relative: 5 %
HCT: 34.5 % — ABNORMAL LOW (ref 36.0–46.0)
Hemoglobin: 10.3 g/dL — ABNORMAL LOW (ref 12.0–15.0)
Immature Granulocytes: 5 %
Lymphocytes Relative: 23 %
Lymphs Abs: 2.1 10*3/uL (ref 0.7–4.0)
MCH: 27.8 pg (ref 26.0–34.0)
MCHC: 29.9 g/dL — ABNORMAL LOW (ref 30.0–36.0)
MCV: 93.2 fL (ref 80.0–100.0)
Monocytes Absolute: 1.5 10*3/uL — ABNORMAL HIGH (ref 0.1–1.0)
Monocytes Relative: 17 %
Neutro Abs: 4.4 10*3/uL (ref 1.7–7.7)
Neutrophils Relative %: 49 %
Platelets: 238 10*3/uL (ref 150–400)
RBC: 3.7 MIL/uL — ABNORMAL LOW (ref 3.87–5.11)
RDW: 15.8 % — ABNORMAL HIGH (ref 11.5–15.5)
WBC: 8.9 10*3/uL (ref 4.0–10.5)
nRBC: 9.6 % — ABNORMAL HIGH (ref 0.0–0.2)

## 2019-11-07 IMAGING — XA IR IMAGING GUIDED PORT INSERTION
2 series · 2 of 2 positions shown · non-contrast
Comparison: none

INDICATION: 53-year-old female referred for port placement

[Series 1: (id) · 1 of 1 slices shown]
[im 1/1]
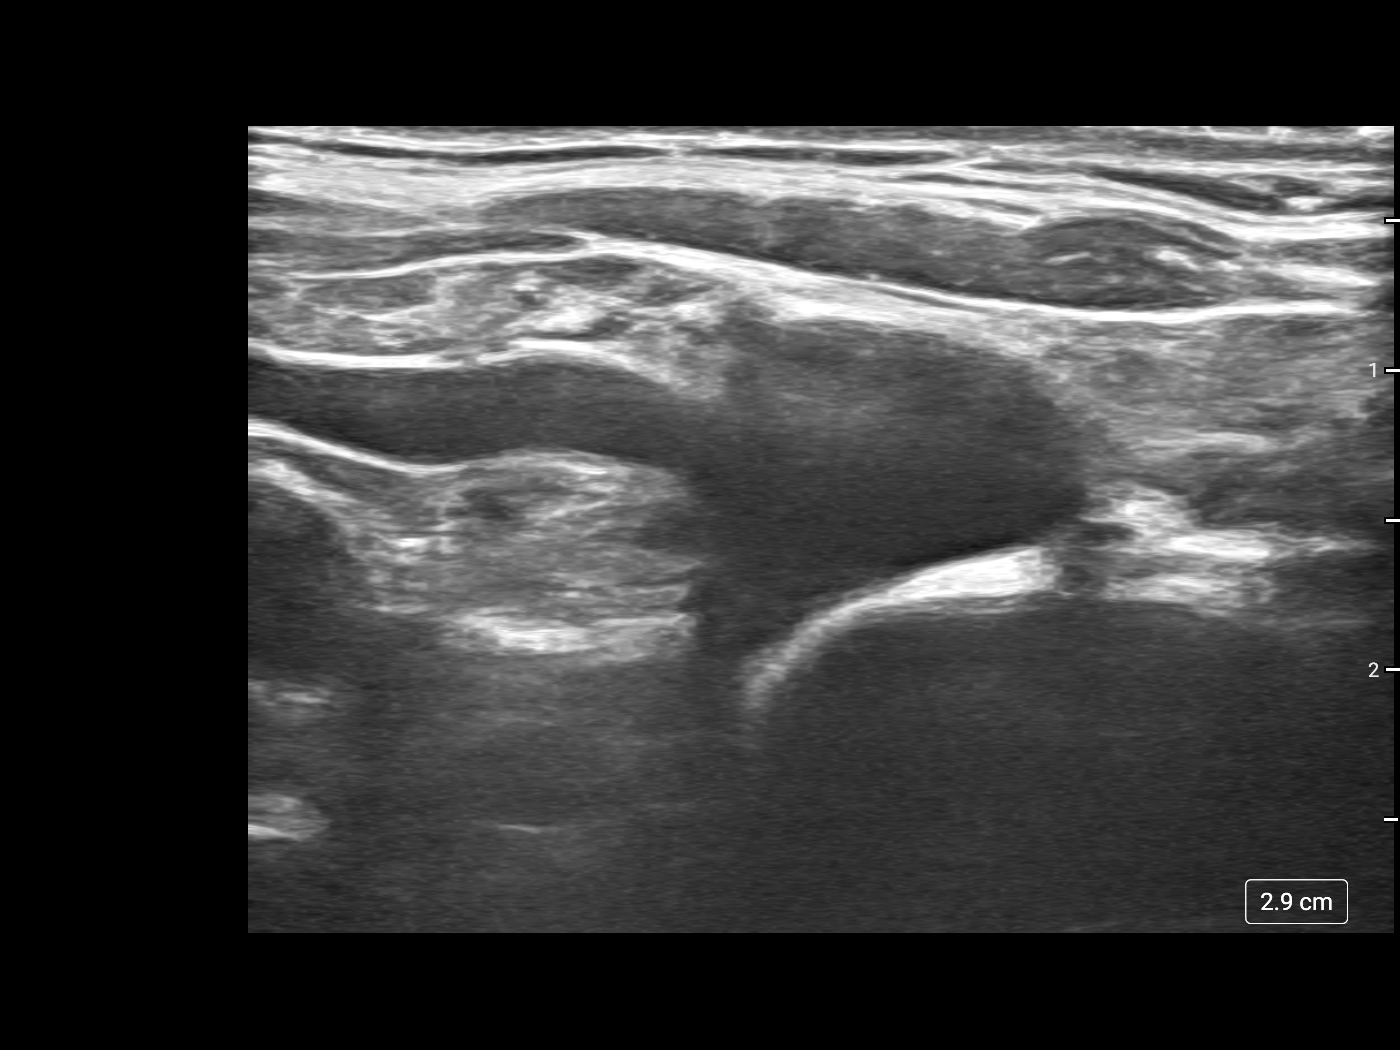

[Series 300: line placements · 1 of 1 slices shown]
[im 1/1]
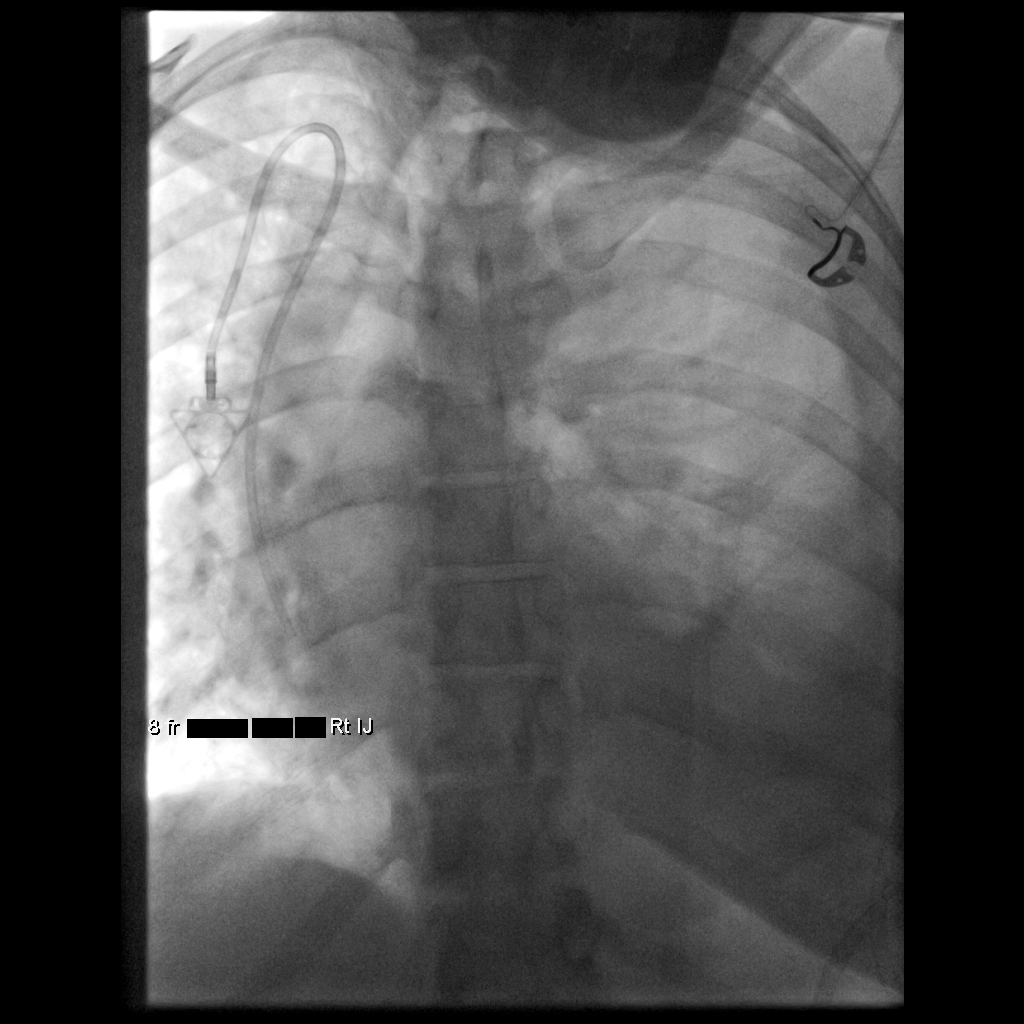

[2 of 2 positions shown; findings below may reference images not displayed]

EXAM:
IMAGE GUIDED PORT CATHETER PLACEMENT

MEDICATIONS:
2 g Ancef; The antibiotic was administered within an appropriate
time interval prior to skin puncture.

ANESTHESIA/SEDATION:
Moderate (conscious) sedation was employed during this procedure. A
total of Versed 2.0 mg and Fentanyl 200 mcg was administered
intravenously.

Moderate Sedation Time: 17 minutes. The patient's level of
consciousness and vital signs were monitored continuously by
radiology nursing throughout the procedure under my direct
supervision.

FLUOROSCOPY TIME:  Fluoroscopy Time: 1 minutes 0 seconds (1 mGy).

COMPLICATIONS:
None

PROCEDURE:
The procedure, risks, benefits, and alternatives were explained to
the patient. Questions regarding the procedure were encouraged and
answered. The patient understands and consents to the procedure.

Ultrasound survey was performed with images stored and sent to PACs.

The right neck and chest was prepped with chlorhexidine, and draped
in the usual sterile fashion using maximum barrier technique (cap
and mask, sterile gown, sterile gloves, large sterile sheet, hand
hygiene and cutaneous antiseptic). Antibiotic prophylaxis was
provided with 2.0g Ancef administered IV one hour prior to skin
incision. Local anesthesia was attained by infiltration with 1%
lidocaine without epinephrine.

Ultrasound demonstrated patency of the right internal jugular vein,
and this was documented with an image. Under real-time ultrasound
guidance, this vein was accessed with a 21 gauge micropuncture
needle and image documentation was performed. A small dermatotomy
was made at the access site with an 11 scalpel. A 0.018" wire was
advanced into the SVC and used to estimate the length of the
internal catheter. The access needle exchanged for a 4F
micropuncture vascular sheath. The 0.018" wire was then removed and
a 0.035" wire advanced into the IVC.



The venous access site was then serially dilated and a peel away
vascular sheath placed over the wire. The wire was removed and the
port catheter advanced into position under fluoroscopic guidance.
The catheter tip is positioned in the cavoatrial junction. This was
documented with a spot image. The portacatheter was then tested and
found to flush and aspirate well. The port was flushed with saline
followed by 100 units/mL heparinized saline.

The pocket was then closed in two layers using first subdermal
inverted interrupted absorbable sutures followed by a running
subcuticular suture. The epidermis was then sealed with Dermabond.
The dermatotomy at the venous access site was also seal with
Dermabond.

Patient tolerated the procedure well and remained hemodynamically
stable throughout.

No complications encountered and no significant blood loss
encountered
IMPRESSION: Status post right IJ port catheter placement.

## 2019-11-07 MED ORDER — SODIUM CHLORIDE 0.9 % IV SOLN: INTRAVENOUS | Status: DC

## 2019-11-07 MED ORDER — FENTANYL CITRATE (PF) 100 MCG/2ML IJ SOLN
INTRAMUSCULAR | Status: AC | PRN
  Administered 2019-11-07 (×2): 50 ug via INTRAVENOUS

## 2019-11-07 MED ORDER — FENTANYL CITRATE (PF) 100 MCG/2ML IJ SOLN
INTRAMUSCULAR | Status: AC
  Filled 2019-11-07: qty 2

## 2019-11-07 MED ORDER — CEFAZOLIN SODIUM-DEXTROSE 2-4 GM/100ML-% IV SOLN: 2.0000 g | INTRAVENOUS | Status: AC

## 2019-11-07 MED ORDER — MIDAZOLAM HCL 2 MG/2ML IJ SOLN
INTRAMUSCULAR | Status: AC
  Filled 2019-11-07: qty 2

## 2019-11-07 MED ORDER — HEPARIN SOD (PORK) LOCK FLUSH 100 UNIT/ML IV SOLN
INTRAVENOUS | Status: AC
  Filled 2019-11-07: qty 5

## 2019-11-07 MED ORDER — HEPARIN SOD (PORK) LOCK FLUSH 100 UNIT/ML IV SOLN
INTRAVENOUS | Status: AC | PRN
  Administered 2019-11-07: 500 [IU] via INTRAVENOUS

## 2019-11-07 MED ORDER — CEFAZOLIN SODIUM-DEXTROSE 2-4 GM/100ML-% IV SOLN
INTRAVENOUS | Status: AC
  Administered 2019-11-07: 2 g via INTRAVENOUS
  Filled 2019-11-07: qty 100

## 2019-11-07 MED ORDER — MIDAZOLAM HCL 2 MG/2ML IJ SOLN
INTRAMUSCULAR | Status: AC | PRN
  Administered 2019-11-07 (×2): 1 mg via INTRAVENOUS

## 2019-11-07 MED ORDER — LIDOCAINE HCL 1 % IJ SOLN
INTRAMUSCULAR | Status: AC
  Filled 2019-11-07: qty 20

## 2019-11-07 NOTE — Procedures (Signed)
Interventional Radiology Procedure Note  Procedure: Placement of a right IJ approach single lumen PowerPort.  Tip is positioned at the superior cavoatrial junction and catheter is ready for immediate use.  Complications: None Recommendations:  - Ok to shower tomorrow - Do not submerge for 7 days - Routine line care   Signed,  Olivya Sobol S. Aniston Christman, DO   

## 2019-11-07 NOTE — Progress Notes (Signed)
Per Dr. Benay Spice request, email to [facility] Pathology department requesting PD1 testing as soon as possible on liver case from 10/29/2019 with #[id number]. Left message w/Karen Florene Glen in genetics to assist with ordering the BRCA testing from peripheral blood correctly for 11/09/19

## 2019-11-07 NOTE — Discharge Instructions (Signed)
Moderate Conscious Sedation, Adult, Care After These instructions provide you with information about caring for yourself after your procedure. Your health care provider may also give you more specific instructions. Your treatment has been planned according to current medical practices, but problems sometimes occur. Call your health care provider if you have any problems or questions after your procedure. What can I expect after the procedure? After your procedure, it is common:  To feel sleepy for several hours.  To feel clumsy and have poor balance for several hours.  To have poor judgment for several hours.  To vomit if you eat too soon. Follow these instructions at home: For at least 24 hours after the procedure:   Do not: ? Participate in activities where you could fall or become injured. ? Drive. ? Use heavy machinery. ? Drink alcohol. ? Take sleeping pills or medicines that cause drowsiness. ? Make important decisions or sign legal documents. ? Take care of children on your own.  Rest. Eating and drinking  Follow the diet recommended by your health care provider.  If you vomit: ? Drink water, juice, or soup when you can drink without vomiting. ? Make sure you have little or no nausea before eating solid foods. General instructions  Have a responsible adult stay with you until you are awake and alert.  Take over-the-counter and prescription medicines only as told by your health care provider.  If you smoke, do not smoke without supervision.  Keep all follow-up visits as told by your health care provider. This is important. Contact a health care provider if:  You keep feeling nauseous or you keep vomiting.  You feel light-headed.  You develop a rash.  You have a fever. Get help right away if:  You have trouble breathing. This information is not intended to replace advice given to you by your health care provider. Make sure you discuss any questions you have  with your health care provider. Document Revised: 09/09/2017 Document Reviewed: 01/17/2016 Elsevier Patient Education  2020 Elsevier Inc. Implanted Port Insertion, Care After This sheet gives you information about how to care for yourself after your procedure. Your health care provider may also give you more specific instructions. If you have problems or questions, contact your health care provider. What can I expect after the procedure? After the procedure, it is common to have:  Discomfort at the port insertion site.  Bruising on the skin over the port. This should improve over 3-4 days. Follow these instructions at home: Port care  After your port is placed, you will get a manufacturer's information card. The card has information about your port. Keep this card with you at all times.  Take care of the port as told by your health care provider. Ask your health care provider if you or a family member can get training for taking care of the port at home. A home health care nurse may also take care of the port.  Make sure to remember what type of port you have. Incision care      Follow instructions from your health care provider about how to take care of your port insertion site. Make sure you: ? Wash your hands with soap and water before and after you change your bandage (dressing). If soap and water are not available, use hand sanitizer. ? Change your dressing as told by your health care provider. ? Leave stitches (sutures), skin glue, or adhesive strips in place. These skin closures may need to stay   in place for 2 weeks or longer. If adhesive strip edges start to loosen and curl up, you may trim the loose edges. Do not remove adhesive strips completely unless your health care provider tells you to do that.  Check your port insertion site every day for signs of infection. Check for: ? Redness, swelling, or pain. ? Fluid or blood. ? Warmth. ? Pus or a bad smell. Activity  Return  to your normal activities as told by your health care provider. Ask your health care provider what activities are safe for you.  Do not lift anything that is heavier than 10 lb (4.5 kg), or the limit that you are told, until your health care provider says that it is safe. General instructions  Take over-the-counter and prescription medicines only as told by your health care provider.  Do not take baths, swim, or use a hot tub until your health care provider approves. Ask your health care provider if you may take showers. You may only be allowed to take sponge baths.  Do not drive for 24 hours if you were given a sedative during your procedure.  Wear a medical alert bracelet in case of an emergency. This will tell any health care providers that you have a port.  Keep all follow-up visits as told by your health care provider. This is important. Contact a health care provider if:  You cannot flush your port with saline as directed, or you cannot draw blood from the port.  You have a fever or chills.  You have redness, swelling, or pain around your port insertion site.  You have fluid or blood coming from your port insertion site.  Your port insertion site feels warm to the touch.  You have pus or a bad smell coming from the port insertion site. Get help right away if:  You have chest pain or shortness of breath.  You have bleeding from your port that you cannot control. Summary  Take care of the port as told by your health care provider. Keep the manufacturer's information card with you at all times.  Change your dressing as told by your health care provider.  Contact a health care provider if you have a fever or chills or if you have redness, swelling, or pain around your port insertion site.  Keep all follow-up visits as told by your health care provider. This information is not intended to replace advice given to you by your health care provider. Make sure you discuss any  questions you have with your health care provider. Document Revised: 04/25/2018 Document Reviewed: 04/25/2018 Elsevier Patient Education  2020 Elsevier Inc.  

## 2019-11-07 NOTE — Progress Notes (Signed)
Called pt to introduce myself as her Arboriculturist and to discuss copay assistance.  I left a msg requesting she return my call at her earliest convenience.

## 2019-11-07 NOTE — H&P (Signed)
Referring Physician(s): Ladell Pier  Supervising Physician: Corrie Mckusick  Patient Status:  [facility]  Chief Complaint:  "I'm here for a port a cath"  Subjective: Patient familiar to IR service from left liver lesion biopsy on 10/29/2019. She has a history of newly diagnosed metastatic left breast carcinoma. She also has a left upper extremity DVT, currently on Lovenox. She presents today for Port-A-Cath placement for chemotherapy. She currently denies fever, headache, chest pain, dyspnea, cough, abdominal pain, nausea, vomiting or bleeding. She does have left arm swelling/pain and some low back pain.  Past Medical History:  Diagnosis Date  . Allergic rhinitis   . Hyperlipidemia    LDL 150's '11   Past Surgical History:  Procedure Laterality Date  . CESAREAN SECTION    . IR US GUIDE BX ASP/DRAIN  10/29/2019     Allergies: Shellfish allergy and Sulfamethoxazole  Medications: Prior to Admission medications   Medication Sig Start Date End Date Taking? Authorizing Provider  enoxaparin (LOVENOX) 80 MG/0.8ML injection Inject 0.75 mLs (75 mg total) into the skin every 12 (twelve) hours. 10/31/19 11/30/19 Yes Kayleen Memos, DO  HYDROcodone-acetaminophen (NORCO/VICODIN) 5-325 MG tablet Take 1 tablet by mouth every 8 (eight) hours as needed for moderate pain or severe pain. 11/02/19  Yes Owens Shark, NP  lidocaine-prilocaine (EMLA) cream Apply 1 application topically as directed. Apply to port 1 hour prior to stick and cover with plastic wrap 11/02/19  Yes Ladell Pier, MD  dexamethasone (DECADRON) 2 MG tablet Take 5 tablets (10 mg total) by mouth as directed. Take 5 tablets at bedtime night before first chemo and repeat at 6 am day of 1st chemo 11/02/19   Ladell Pier, MD  diclofenac (VOLTAREN) 75 MG EC tablet Take 75 mg by mouth 2 (two) times daily. 10/25/19   [provider]  methocarbamol (ROBAXIN) 500 MG tablet Take 500 mg by mouth 4 (four) times daily. 10/18/19    [provider]  ondansetron (ZOFRAN) 8 MG tablet Take 1 tablet (8 mg total) by mouth every 8 (eight) hours as needed for nausea or vomiting. 11/02/19   Ladell Pier, MD  prochlorperazine (COMPAZINE) 10 MG tablet Take 1 tablet (10 mg total) by mouth every 6 (six) hours as needed for nausea. 11/02/19   Ladell Pier, MD     Vital Signs: Blood pressure 109/71, temp 98.7, heart rate 119, respirations 15, O2 sat 97% room air   Physical Exam awake, alert. Chest clear to auscultation bilaterally. Heart with tachycardic but regular rhythm. Abdomen soft, positive bowel sounds, nontender. No LE edema. LUE edema noted(known DVT)  Imaging: No results found.  Labs:  CBC: Recent Labs    10/30/19 0555 10/31/19 0402 11/05/19 1341 11/07/19 1320  WBC 7.6  8.0 8.1 7.9 8.9  HGB 7.4*  7.4* 7.6* 9.9* 10.3*  HCT 25.5*  25.0* 25.9* 33.1* 34.5*  PLT 230  228 235 223 238    COAGS: Recent Labs    10/28/19 1420 10/28/19 2114 10/29/19 0738 10/29/19 1016 10/30/19 0555  INR  --   --   --  1.1  --   APTT 59* 52* 109*  --  66*    BMP: Recent Labs    10/26/19 0345 10/28/19 0615 10/30/19 0555 11/05/19 1341  NA 139 141 139 144  K 3.6 3.6 3.7 3.7  CL 100 105 106 102  CO2 27 23 26 30   GLUCOSE 103* 91 100* 108*  BUN 15 11 11  15  CALCIUM 9.3 9.3 9.1 9.5  CREATININE 0.72 0.70 0.64 0.80  GFRNONAA >60 >60 >60 >60  GFRAA >60 >60 >60 >60    LIVER FUNCTION TESTS: Recent Labs    10/26/19 0345 10/28/19 0615 10/30/19 0555 11/05/19 1341  BILITOT 0.5 0.8 0.5 0.3  AST 41 31 40 44*  ALT 61* 49* 51* 61*  ALKPHOS 307* 249* 242* 250*  PROT 7.0 6.7 6.2* 7.2  ALBUMIN 3.3* 3.1* 2.8* 3.1*    Assessment and Plan: Pt with history of newly diagnosed metastatic left breast carcinoma. She also has a left upper extremity DVT, currently on Lovenox. She presents today for Port-A-Cath placement for chemotherapy.Risks and benefits of image guided port-a-catheter placement was discussed with  the patient including, but not limited to bleeding, infection, pneumothorax, or fibrin sheath development and need for additional procedures.  All of the patient's questions were answered, patient is agreeable to proceed. Consent signed and in chart.     Electronically Signed: D. Rowe Robert, PA-C 11/07/2019, 2:05 PM   I spent a total of 25 minutes at the the patient's bedside AND on the patient's hospital floor or unit, greater than 50% of which was counseling/coordinating care for Port-A-Cath placement

## 2019-11-07 NOTE — Telephone Encounter (Signed)
Left message with contact information to discuss navigation resources.

## 2019-11-08 ENCOUNTER — Telehealth: Payer: Self-pay

## 2019-11-08 ENCOUNTER — Inpatient Hospital Stay (HOSPITAL_BASED_OUTPATIENT_CLINIC_OR_DEPARTMENT_OTHER): Payer: Managed Care, Other (non HMO) | Admitting: Nurse Practitioner

## 2019-11-08 ENCOUNTER — Ambulatory Visit (HOSPITAL_BASED_OUTPATIENT_CLINIC_OR_DEPARTMENT_OTHER)
Admission: RE | Admit: 2019-11-08 | Discharge: 2019-11-08 | Disposition: A | Discharge status: Home / Self Care | Payer: Managed Care, Other (non HMO) | Source: Ambulatory Visit | Attending: Nurse Practitioner | Admitting: Nurse Practitioner

## 2019-11-08 ENCOUNTER — Ambulatory Visit (HOSPITAL_COMMUNITY)
Admission: RE | Admit: 2019-11-08 | Discharge: 2019-11-08 | Disposition: A | Discharge status: Home / Self Care | Payer: Managed Care, Other (non HMO) | Source: Ambulatory Visit | Attending: Oncology | Admitting: Oncology

## 2019-11-08 ENCOUNTER — Other Ambulatory Visit: Payer: Self-pay

## 2019-11-08 ENCOUNTER — Encounter: Payer: Self-pay | Admitting: Nurse Practitioner

## 2019-11-08 VITALS — BP 114/72 | HR 125 | Temp 98.6°F | Resp 17 | Ht 61.0 in | Wt 160.0 lb

## 2019-11-08 DIAGNOSIS — I82622 Acute embolism and thrombosis of deep veins of left upper extremity: Secondary | ICD-10-CM | POA: Insufficient documentation

## 2019-11-08 DIAGNOSIS — Z171 Estrogen receptor negative status [ER-]: Secondary | ICD-10-CM | POA: Diagnosis not present

## 2019-11-08 DIAGNOSIS — C50412 Malignant neoplasm of upper-outer quadrant of left female breast: Secondary | ICD-10-CM | POA: Diagnosis not present

## 2019-11-08 LAB — ECHOCARDIOGRAM COMPLETE
Height: 61 in
Weight: 2560 oz

## 2019-11-08 IMAGING — DX DG CHEST 2V
2 series · 3 of 3 positions shown · non-contrast
Comparison: CT [DATE].  Chest x-ray [DATE].

CLINICAL DATA: Breast cancer.  Follow-up pleural effusion.

EXAM:
CHEST - 2 VIEW

[chest pa]
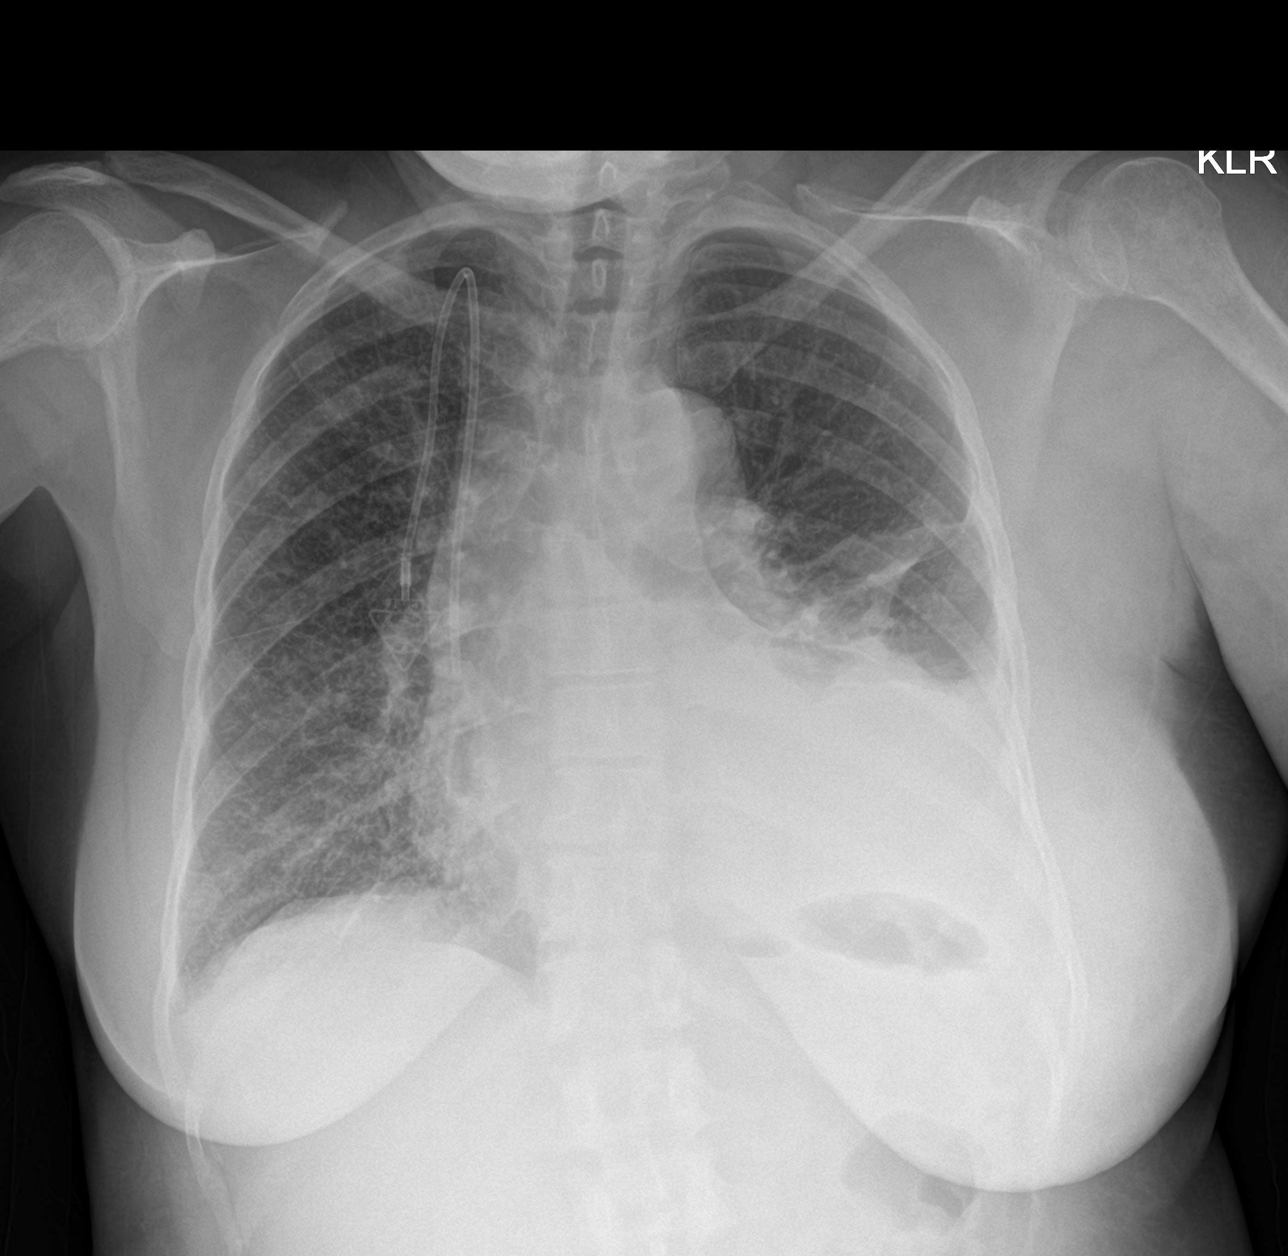

[Series 2: chest lat · 0.14mm/px · 2 of 2 slices shown]
[im 1/2]
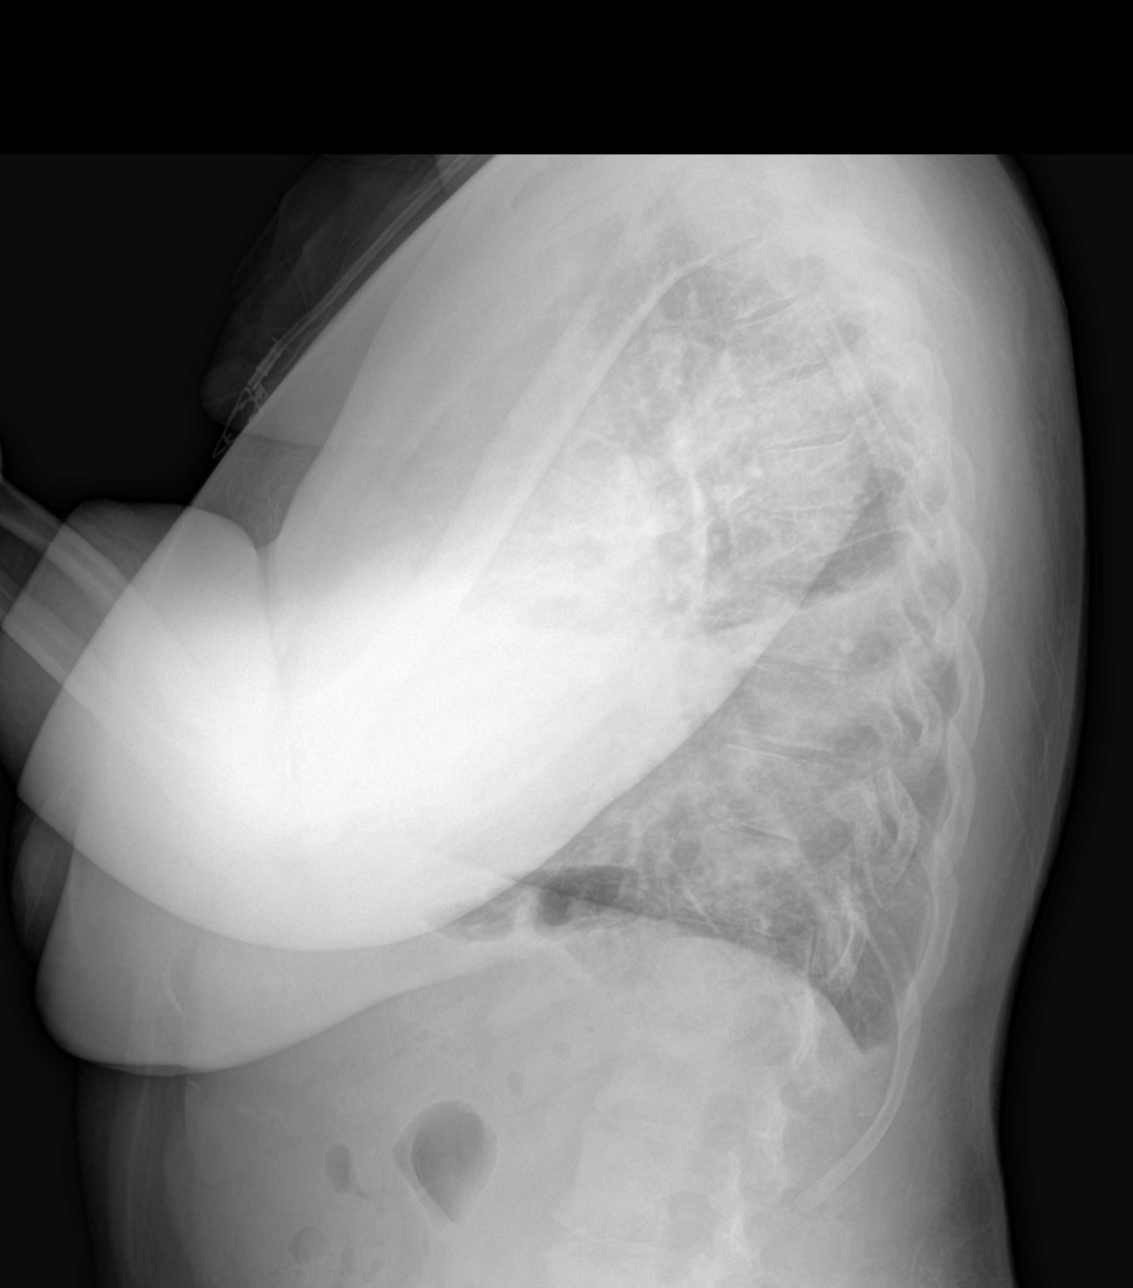
[im 2/2]
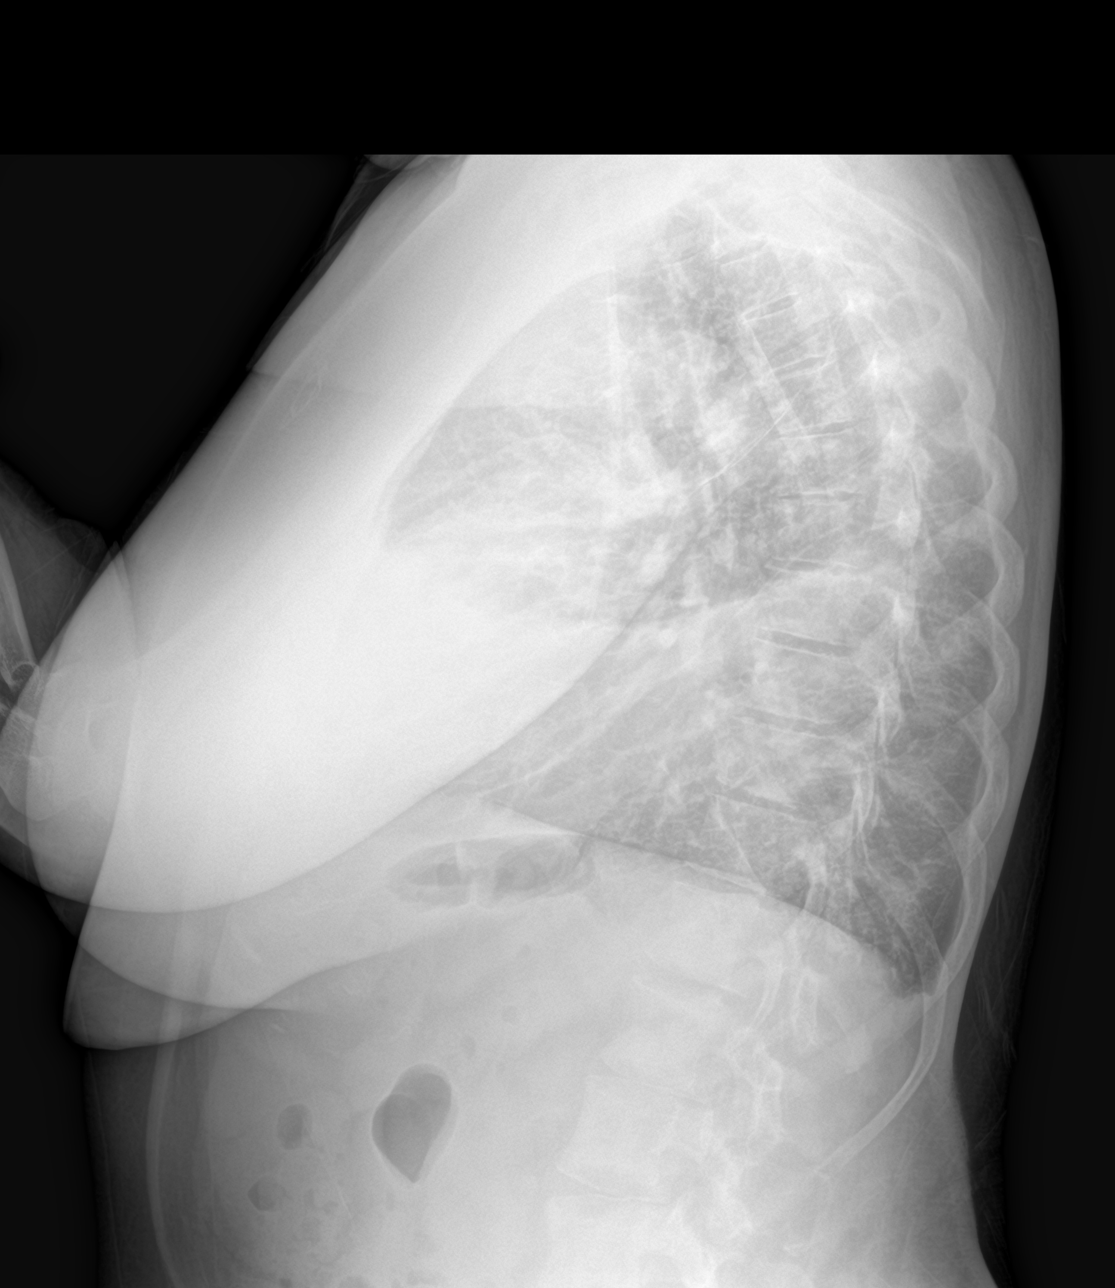

[3 of 3 positions shown; findings below may reference images not displayed]

FINDINGS: PowerPort catheter noted with lead tip over superior vena cava.
Cardiomegaly. Bilateral interstitial prominence. CHF could present
this fashion as could pneumonitis and/or interstitial tumor spread.
Atelectatic changes left mid and lower lung. Increased left-sided
pleural effusion. No pneumothorax. Metastatic bony lesions best
identified by prior CT.
IMPRESSION: 1.  PowerPort catheter noted with tip over superior vena cava.

2. Cardiomegaly. Bilateral interstitial prominence. CHF could
present in this fashion. Pneumonitis and or interstitial tumor
spread could also present in this fashion.

3.  Atelectatic changes left mid and lower lung.

4.  Increased left-sided pleural effusion.

5.  Metastatic bony lesions best identified by prior CT.

## 2019-11-08 MED ORDER — ENOXAPARIN SODIUM 120 MG/0.8ML ~~LOC~~ SOLN: 1.5000 mg/kg | SUBCUTANEOUS | 2 refills | Status: DC

## 2019-11-08 NOTE — Telephone Encounter (Signed)
TC again to echo lab [phone] to get echo rescheduled for Today 09/07/20 @3 :00 pm and [facility]. Patient made aware of appointment and location

## 2019-11-08 NOTE — Progress Notes (Signed)
Echocardiogram 2D Echocardiogram has been performed.  Oneal Deputy Jaeleah Smyser 11/08/2019, 4:19 PM

## 2019-11-08 NOTE — Telephone Encounter (Signed)
Per Ned Card NP called and scheduled patient for Thoracentesis Monday 11/12/19 @10 , she is scheduled for Covid test this Saturday 11/10/19 @ 10:15am, and she is getting her Chest Xray today 11/08/19. Patient aware of all appointment dates, times, and locations.

## 2019-11-08 NOTE — Telephone Encounter (Signed)
Per Ned Card NP called patient and let her know that her heart looked enlarged and there was some fluid around her lung. I let her know that Lattie Haw wanted me to schedule her to get a 2D echo. I schedule her appointment for tomorrow 11/09/19 @10 :00am here at [facility]. Patient verbalized understanding. Also let her know to check in at the admitting desk in the main hospital entrance.

## 2019-11-08 NOTE — Progress Notes (Addendum)
[facility] OFF[facility] PROGRESS NOTE   Diagnosis: Breast cancer  INTERVAL HISTORY:   Abigail [name] returns as scheduled.  Left arm continues to be swollen and painful.  Back pain is "not too bad".  She has hydrocodone for as needed use.  Bowels are moving.  She notes she is more "winded".  No fever or cough.  She is "tired".  Objective:  Vital signs in last 24 hours:  Blood pressure 114/72, pulse (!) 125, temperature 98.6 F (37 C), temperature source Temporal, resp. rate 17, height 5' 1" (1.549 m), weight 160 lb (72.6 kg), SpO2 99 %.    Resp: Breath sounds diminished left lower lung field.  No respiratory distress. Cardio: Regular, tachycardic. GI: Abdomen soft and nontender.  No hepatomegaly. Vascular: No leg edema.  Entire left arm is edematous. Breast: Left breast is edematous with skin thickening. Port-A-Cath right chest, covered with a bandage.  Lab Results:  Lab Results  Component Value Date   WBC 8.9 11/07/2019   HGB 10.3 (L) 11/07/2019   HCT 34.5 (L) 11/07/2019   MCV 93.2 11/07/2019   PLT 238 11/07/2019   NEUTROABS 4.4 11/07/2019    Imaging:  IR IMAGING GUIDED PORT INSERTION  Result Date: 11/07/2019 INDICATION: 54 year old female referred for port placement EXAM: IMAGE GUIDED PORT CATHETER PLACEMENT MEDICATIONS: 2 g Ancef; The antibiotic was administered within an appropriate time interval prior to skin puncture. ANESTHESIA/SEDATION: Moderate (conscious) sedation was employed during this procedure. A total of Versed 2.0 mg and Fentanyl 200 mcg was administered intravenously. Moderate Sedation Time: 17 minutes. The patient's level of consciousness and vital signs were monitored continuously by radiology nursing throughout the procedure under my direct supervision. FLUOROSCOPY TIME:  Fluoroscopy Time: 1 minutes 0 seconds (1 mGy). COMPLICATIONS: None PROCEDURE: The procedure, risks, benefits, and alternatives were explained to the patient. Questions regarding  the procedure were encouraged and answered. The patient understands and consents to the procedure. Ultrasound survey was performed with images stored and sent to PACs. The right neck and chest was prepped with chlorhexidine, and draped in the usual sterile fashion using maximum barrier technique (cap and mask, sterile gown, sterile gloves, large sterile sheet, hand hygiene and cutaneous antiseptic). Antibiotic prophylaxis was provided with 2.0g Ancef administered IV one hour prior to skin incision. Local anesthesia was attained by infiltration with 1% lidocaine without epinephrine. Ultrasound demonstrated patency of the right internal jugular vein, and this was documented with an image. Under real-time ultrasound guidance, this vein was accessed with a 21 gauge micropuncture needle and image documentation was performed. A small dermatotomy was made at the access site with an 11 scalpel. A 0.018" wire was advanced into the SVC and used to estimate the length of the internal catheter. The access needle exchanged for a 89F micropuncture vascular sheath. The 0.018" wire was then removed and a 0.035" wire advanced into the IVC. An appropriate location for the subcutaneous reservoir was selected below the clavicle and an incision was made through the skin and underlying soft tissues. The subcutaneous tissues were then dissected using a combination of blunt and sharp surgical technique and a pocket was formed. A single lumen power injectable portacatheter was then tunneled through the subcutaneous tissues from the pocket to the dermatotomy and the port reservoir placed within the subcutaneous pocket. The venous access site was then serially dilated and a peel away vascular sheath placed over the wire. The wire was removed and the port catheter advanced into position under fluoroscopic guidance. The  catheter tip is positioned in the cavoatrial junction. This was documented with a spot image. The portacatheter was then tested  and found to flush and aspirate well. The port was flushed with saline followed by 100 units/mL heparinized saline. The pocket was then closed in two layers using first subdermal inverted interrupted absorbable sutures followed by a running subcuticular suture. The epidermis was then sealed with Dermabond. The dermatotomy at the venous access site was also seal with Dermabond. Patient tolerated the procedure well and remained hemodynamically stable throughout. No complications encountered and no significant blood loss encountered IMPRESSION: Status post right IJ port catheter placement. Signed, Dulcy Fanny. Dellia Nims, RPVI [facility] Electronically Signed   By: Corrie Mckusick D.O.   On: 11/07/2019 15:11    Medications: I have reviewed the patient's current medications.  Assessment/Plan: 1.  Metastatic breast cancer  CT chest 10/24/2018-left breast mass left axillary lymph nodes, abnormal appearance of the left concerning for tumor involvement, mediastinal adenopathy, diffuse sclerotic and lytic lesions, left pleural effusion  CT abdomen/pelvis 10/25/1998 enhancing left breast mass with left axillary lymphadenopathy and tumor involved the left chest wall the latissimus dorsi segment 4B liver lesion, diffuse lytic/sclerotic lesions  Ultrasound-guided biopsy of left liver lesion 10/29/1999-metastatic carcinoma, cytokeratin and GATA3 positive consistent with metastatic breast cancer.  HER-2 negative, ER negative, PR negative, Ki-67 20% 2. Left upper extremity DVT-left subclavian, axillary, and brachial vein thrombosis confirmed on  Anticoagulation with apixaban, changed to parenteral hospital admission 10/25/2019 transition to twice daily Lovenox at discharge; transition to once daily 11/08/2019. 3.   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   Left pleural effusion 5.   Port-A-Cath placement 11/07/2019,  interventional radiology   Disposition: Abigail [name] appears unchanged.  The PD1 result is pending.  Dr. Benay Spice recommends waiting for this result prior to proceeding with treatment.  If the combined positive score indicates she is a good candidate for immunotherapy Dr. Gearldine Shown recommendation is to begin treatment with Abraxane/pembrolizumab.  We reviewed potential toxicities associated with Abraxane including bone marrow toxicity, nausea, hair loss, neuropathy, allergic reaction.  We reviewed potential toxicities associated with pembrolizumab including rash, diarrhea, endocrinopathies, hepatitis, pneumonitis, autonomic neuropathy.  She agrees with this plan.  She has a left pleural effusion.  She is more short of breath.  We are referring her for a chest x-ray today with the plan for a thoracentesis if the effusion is felt to be significant.  She is on Lovenox for a left upper extremity DVT.  She is currently taking Lovenox twice daily.  We will transition to once daily dosing.  She will return for lab, follow-up, treatment on 11/14/2019.  She will contact the office in the interim with any problems.  Patient seen with Dr. Benay Spice.    Ned Card ANP/GNP-BC   11/08/2019  9:34 AM This was a shared visit with Ned Card.  Abigail [name] has been diagnosed with triple negative metastatic breast cancer.  She is scheduled to begin treatment with Taxol/carboplatin tomorrow.  We discussed other systemic treatment options including treatment with a single agent taxane in addition to a PD1 inhibitor.  Patients with a positive PD1 score are eligible for immunotherapy in the first line setting.  She is symptomatic with left arm swelling and dyspnea.  The dyspnea is most likely related to the left pleural effusion.  We discussed potential toxicities associated with Abraxane and PD1 inhibitors.  The initial recommendation for Taxol/carboplatin was  to obtain a rapid cytoreduction of the metastatic tumor  burden while waiting on PD1 and BRCA testing.  Her clinical status appears stable.  She would like to consider proceeding with Abraxane/pembrolizumab if the PD1 testing will be available by next week. We referred her for a follow-up chest x-ray today.  We will decide on proceeding with Taxol/carboplatin depending on the chest x-ray result and the timing of the PD1 testing.  [name], MD

## 2019-11-09 ENCOUNTER — Other Ambulatory Visit: Payer: Self-pay | Admitting: Nurse Practitioner

## 2019-11-09 ENCOUNTER — Ambulatory Visit (HOSPITAL_COMMUNITY)
Admission: RE | Admit: 2019-11-09 | Discharge: 2019-11-09 | Disposition: A | Discharge status: Home / Self Care | Payer: Managed Care, Other (non HMO) | Source: Ambulatory Visit | Attending: Nurse Practitioner | Admitting: Nurse Practitioner

## 2019-11-09 ENCOUNTER — Other Ambulatory Visit: Payer: Self-pay

## 2019-11-09 ENCOUNTER — Other Ambulatory Visit (HOSPITAL_COMMUNITY): Payer: Managed Care, Other (non HMO)

## 2019-11-09 ENCOUNTER — Inpatient Hospital Stay (HOSPITAL_BASED_OUTPATIENT_CLINIC_OR_DEPARTMENT_OTHER): Payer: Managed Care, Other (non HMO) | Admitting: Medical

## 2019-11-09 ENCOUNTER — Inpatient Hospital Stay: Payer: Managed Care, Other (non HMO)

## 2019-11-09 ENCOUNTER — Telehealth: Payer: Self-pay | Admitting: *Deleted

## 2019-11-09 ENCOUNTER — Other Ambulatory Visit: Payer: Self-pay | Admitting: Infectious Diseases

## 2019-11-09 ENCOUNTER — Telehealth: Payer: Self-pay | Admitting: Oncology

## 2019-11-09 VITALS — BP 106/80 | HR 113 | Temp 98.2°F | Resp 20 | Ht 61.0 in | Wt 162.3 lb

## 2019-11-09 DIAGNOSIS — J90 Pleural effusion, not elsewhere classified: Secondary | ICD-10-CM | POA: Diagnosis not present

## 2019-11-09 DIAGNOSIS — C50412 Malignant neoplasm of upper-outer quadrant of left female breast: Secondary | ICD-10-CM | POA: Diagnosis not present

## 2019-11-09 DIAGNOSIS — R0602 Shortness of breath: Secondary | ICD-10-CM

## 2019-11-09 DIAGNOSIS — Z171 Estrogen receptor negative status [ER-]: Secondary | ICD-10-CM | POA: Insufficient documentation

## 2019-11-09 HISTORY — PX: IR THORACENTESIS ASP PLEURAL SPACE W/IMG GUIDE: IMG5380

## 2019-11-09 LAB — RESPIRATORY PANEL BY RT PCR (FLU A&B, COVID)
Influenza A by PCR: NEGATIVE
Influenza B by PCR: NEGATIVE
SARS Coronavirus 2 by RT PCR: NEGATIVE

## 2019-11-09 IMAGING — US IR THORACENTESIS ASP PLEURAL SPACE W/IMG GUIDE
1 series · 3 of 3 positions shown · non-contrast
Comparison: none

INDICATION: Patient with history of breast cancer, dyspnea, and left pleural
effusion. Request is made for therapeutic left thoracentesis.

[Series 1: (id) · 3 of 3 slices shown]
[im 1/3]
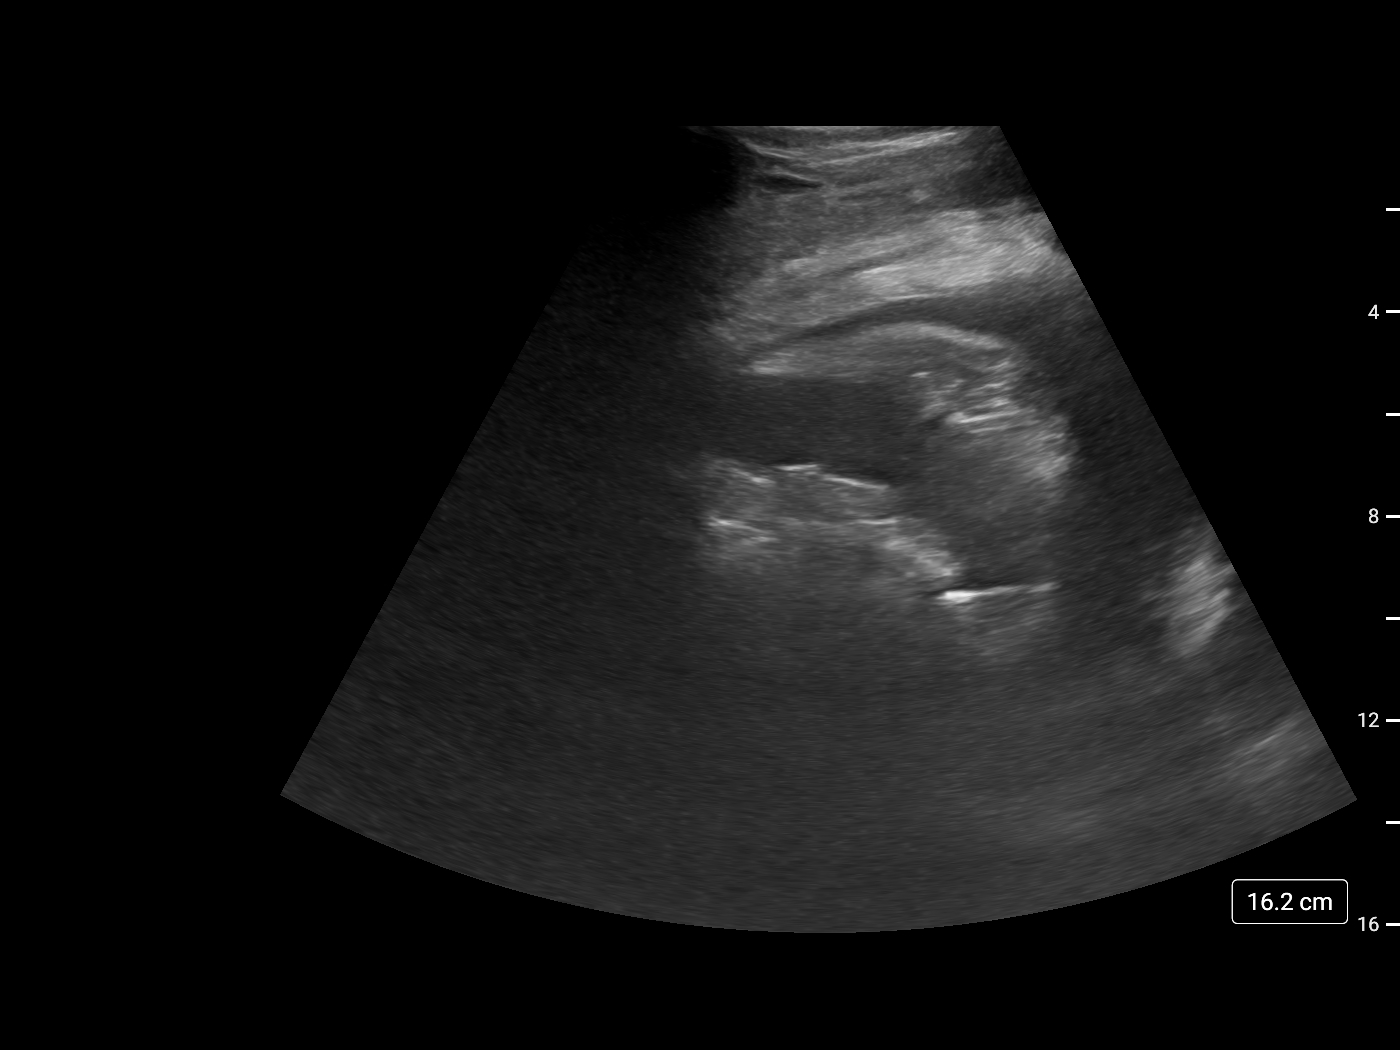
[im 2/3]
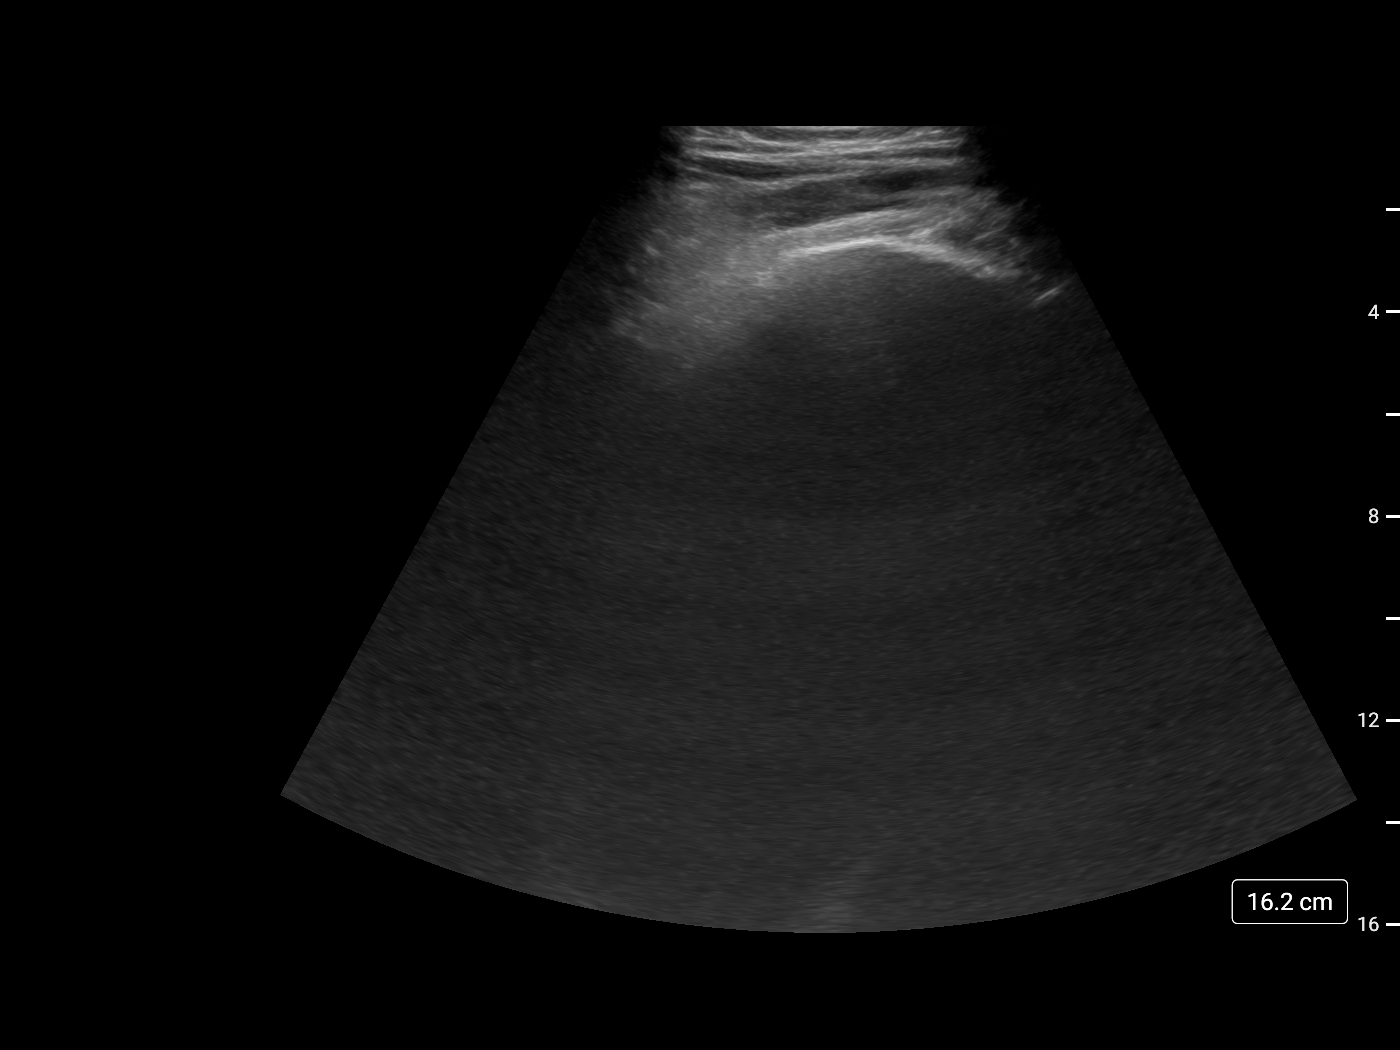
[im 3/3]
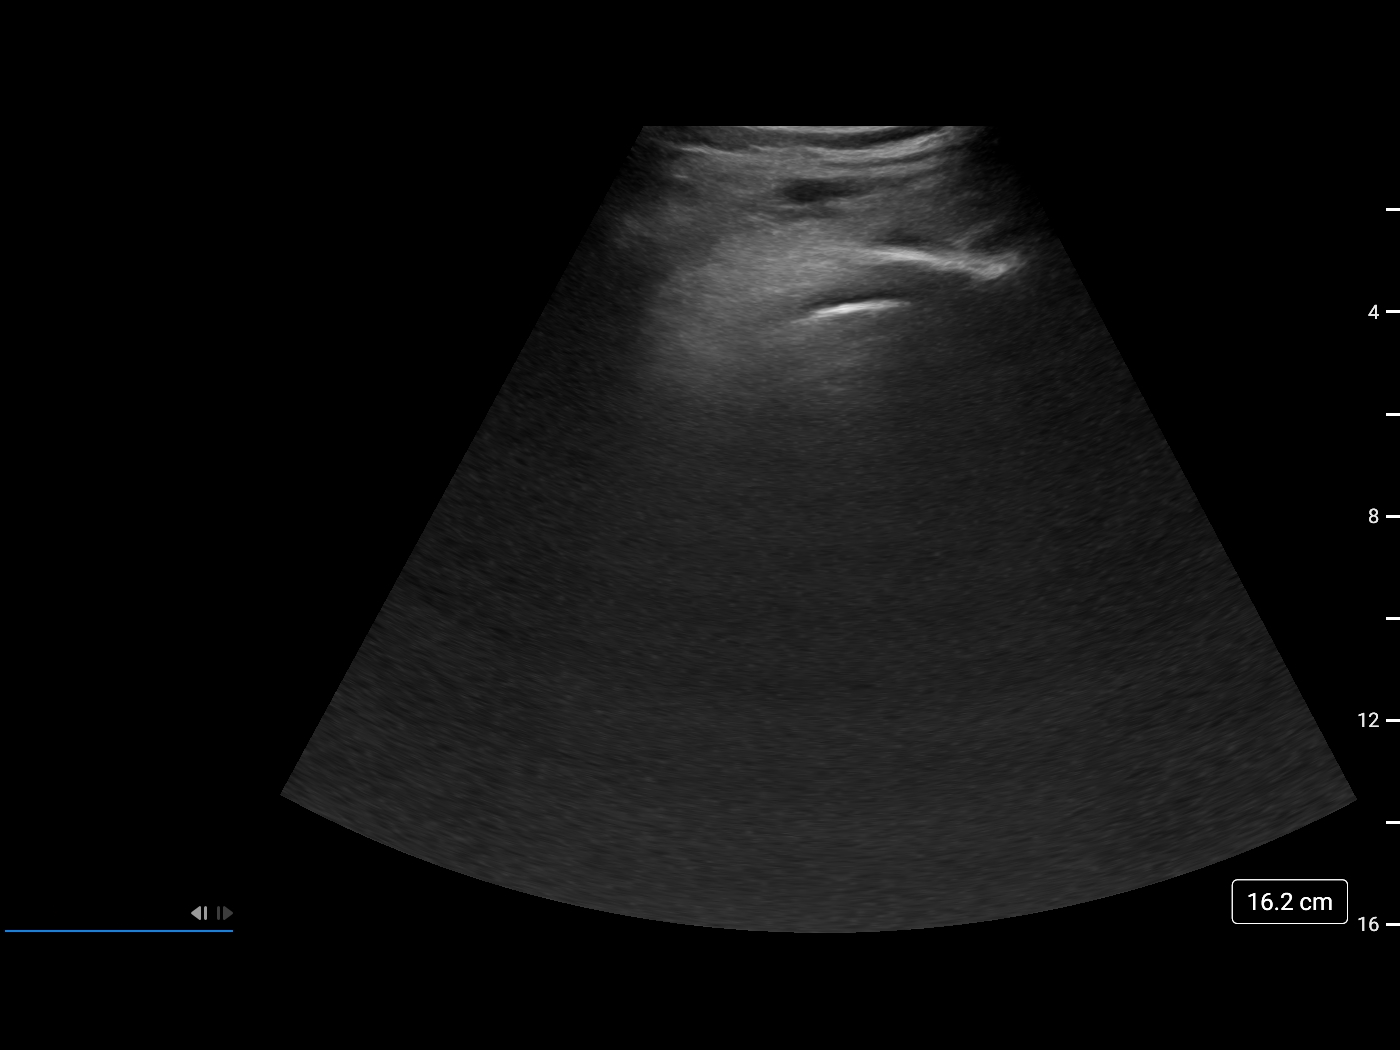

[3 of 3 positions shown; findings below may reference images not displayed]

EXAM:
ULTRASOUND GUIDED THERAPEUTIC LEFT THORACENTESIS

MEDICATIONS:
8 mL 1% lidocaine

COMPLICATIONS:
None immediate.

PROCEDURE:
An ultrasound guided thoracentesis was thoroughly discussed with the
patient and questions answered. The benefits, risks, alternatives
and complications were also discussed. The patient understands and
wishes to proceed with the procedure. Written consent was obtained.

Ultrasound was performed to localize and mark an adequate pocket of
fluid in the left chest. The area was then prepped and draped in the
normal sterile fashion. 1% Lidocaine was used for local anesthesia.
Under ultrasound guidance a 6 Fr Safe-T-Centesis catheter was
introduced. Thoracentesis was performed. The catheter was removed
and a dressing applied.
FINDINGS: A total of approximately 700 mL of hazy gold fluid was removed.
IMPRESSION: Successful ultrasound guided left thoracentesis yielding 700 mL of
pleural fluid.

## 2019-11-09 IMAGING — CR DG CHEST 1V
1 series · 1 of 1 positions shown · non-contrast
Comparison: [DATE]

CLINICAL DATA: Status post thoracentesis on the left.

EXAM:
CHEST  1 VIEW

[chest pa]
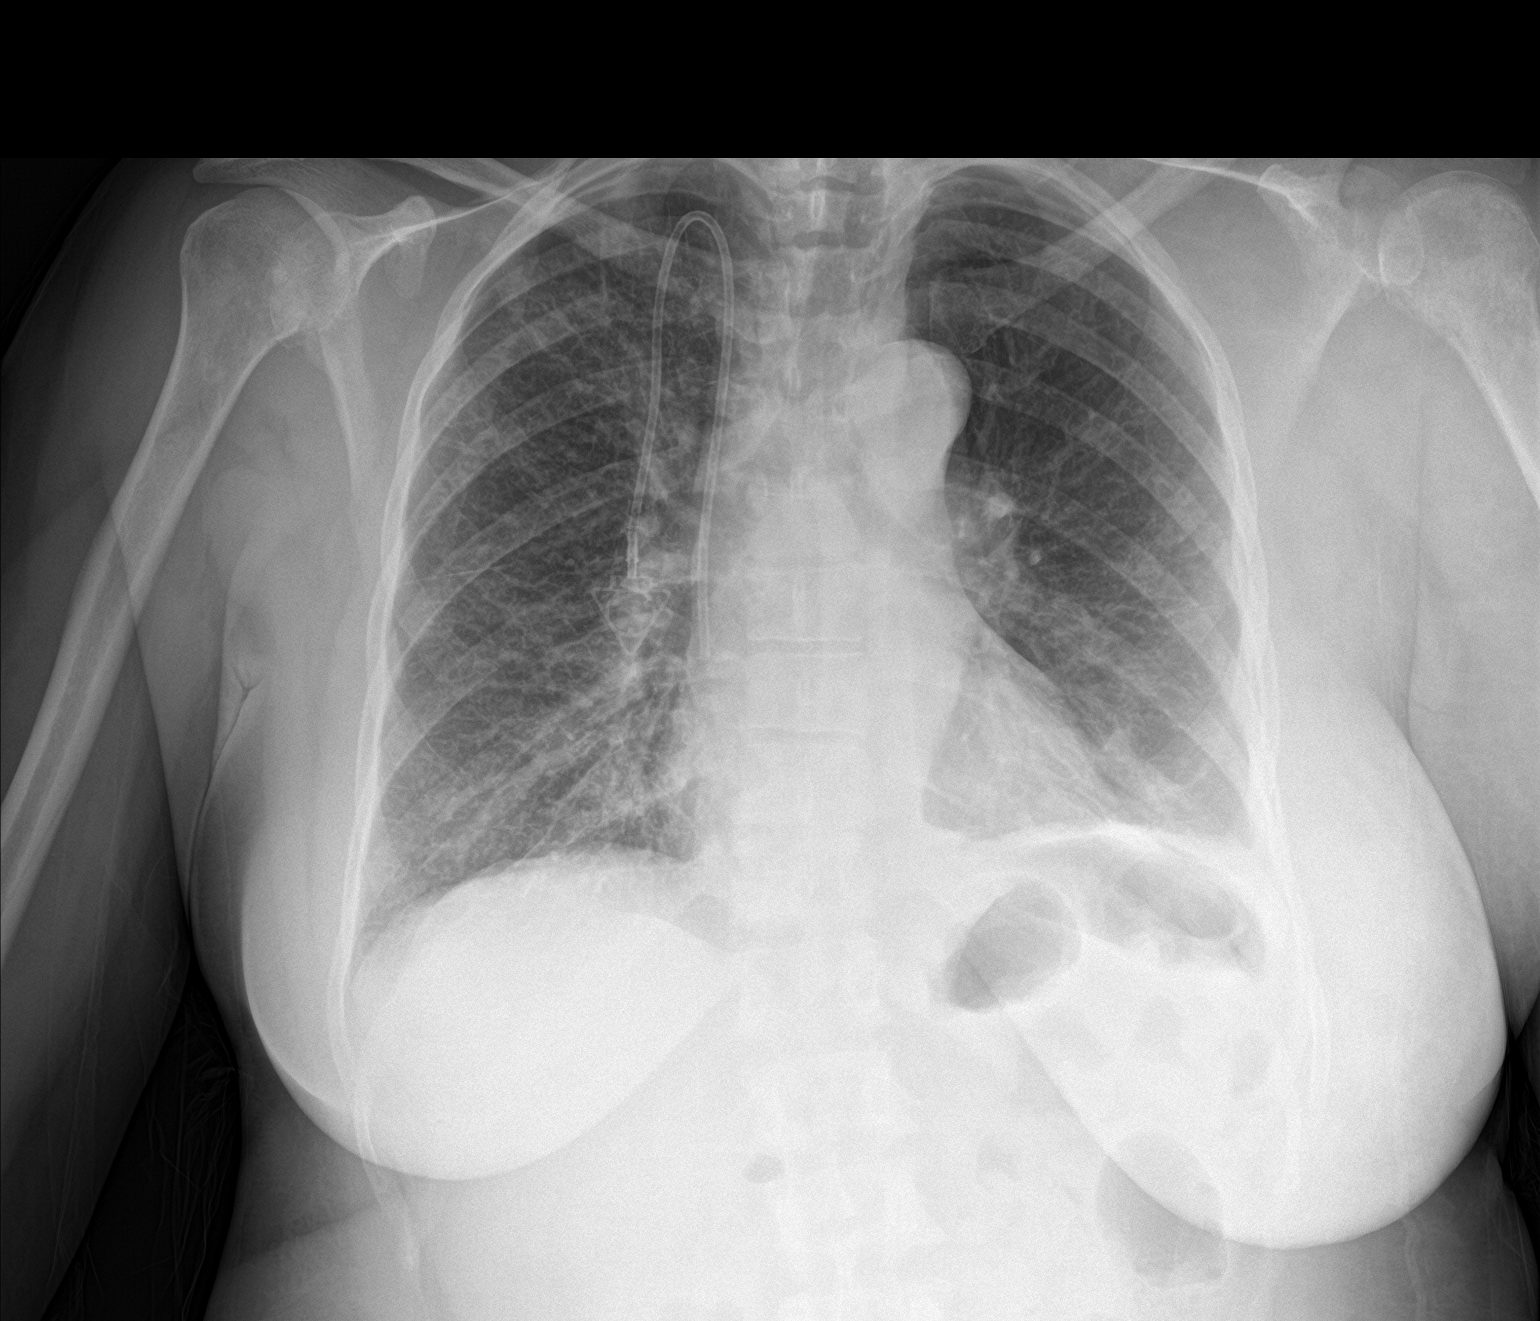

[1 of 1 positions shown; findings below may reference images not displayed]

FINDINGS: No pneumothorax after left thoracentesis. The left pleural effusion
is much smaller in the interval. No other interval changes.
IMPRESSION: The left pleural effusion is smaller in the interval after
thoracentesis. No pneumothorax. No other change.

## 2019-11-09 MED ORDER — LIDOCAINE HCL 1 % IJ SOLN
INTRAMUSCULAR | Status: AC
  Filled 2019-11-09: qty 20

## 2019-11-09 MED ORDER — LIDOCAINE HCL (PF) 1 % IJ SOLN
INTRAMUSCULAR | Status: DC | PRN
  Administered 2019-11-09: 10 mL

## 2019-11-09 NOTE — Procedures (Signed)
PROCEDURE SUMMARY:  Successful image-guided left thoracentesis. Yielded 700 milliliters of hazy gold fluid. Patient tolerated procedure well. No immediate complications. EBL < 5 mL.  Specimen was not sent for labs. CXR ordered.  Please see imaging section of Epic for full dictation.   Earley Abide PA-C 11/09/2019 3:35 PM

## 2019-11-09 NOTE — Progress Notes (Signed)
This patient was seen for COVID-19 sample collection prior to a planned thoracentesis today.  Sandi Mealy, MHS, PA-C Physician Assistant

## 2019-11-09 NOTE — Telephone Encounter (Signed)
Scheduled per los. Called and spoke with patient. Confirmed appts, patient aware of gap between MD and infusion

## 2019-11-09 NOTE — Patient Instructions (Signed)

## 2019-11-09 NOTE — Progress Notes (Signed)
Pt seen by PA Lucianne Lei, no RN assessment performed at this time.  PA Lucianne Lei aware.  Pt here for Covid-19 testing.  Pt aware that she will be contacted regarding her Covid-19 test results by either MD Sherrill's office or PA Van's office (for her upcoming procedure).

## 2019-11-09 NOTE — Telephone Encounter (Signed)
Left VM that testing returned negative and to proceed w/US thoracentesis as scheduled at [facility].

## 2019-11-09 NOTE — Telephone Encounter (Signed)
Dr. Benay Spice had spoken w/patient on 1/28 after CXR that shows increase in pleural effusion. Requests to have rapid COVID test and move thoracentesis to today due to her dyspnea. Dr. Benay Spice spoke with Dr. Johnnye Sima and it was arranged for her to come to [facility] at 11:00 for swab to be collected by PA and sent to [facility] for rapid test. Thoracentesis was rescheduled for [facility] today at 3 pm. [facility] did not have opening. Patient notified and will be told after results noted to confirm procedure will be performed today.

## 2019-11-10 ENCOUNTER — Inpatient Hospital Stay (HOSPITAL_COMMUNITY): Admission: RE | Admit: 2019-11-10 | Payer: Managed Care, Other (non HMO) | Source: Ambulatory Visit

## 2019-11-11 ENCOUNTER — Other Ambulatory Visit: Payer: Self-pay | Admitting: Oncology

## 2019-11-11 NOTE — Progress Notes (Signed)
DISCONTINUE OFF PATHWAY REGIMEN - Breast   [id number]:Carboplatin + Paclitaxel (5/200) q21d:   A cycle is every 21 days:     Paclitaxel      Carboplatin   **Always confirm dose/schedule in your pharmacy ordering system**  REASON: Other Reason PRIOR TREATMENT: Off Pathway: Carboplatin + Paclitaxel (5/200) q21d TREATMENT RESPONSE: Unable to Evaluate  START ON PATHWAY REGIMEN - Breast     A cycle is every 28 days:     Atezolizumab      Nab-paclitaxel (protein bound)   **Always confirm dose/schedule in your pharmacy ordering system**  Patient Characteristics: Distant Metastases or Locoregional Recurrent Disease - Unresected or Locally Advanced Unresectable Disease Progressing after Neoadjuvant and Local Therapies, HER2 Negative/Unknown/Equivocal, ER Negative/Unknown, Chemotherapy, First Line, ER Negative,  PD-L1 Expression Positive Therapeutic Status: Distant Metastases BRCA Mutation Status: Awaiting Test Results ER Status: Negative (-) HER2 Status: Negative (-) PR Status: Negative (-) Line of Therapy: First Line PD-L1 Expression Status: PD-L1 Positive Intent of Therapy: Non-Curative / Palliative Intent, Discussed with Patient

## 2019-11-12 ENCOUNTER — Ambulatory Visit (HOSPITAL_COMMUNITY): Admission: RE | Admit: 2019-11-12 | Payer: Managed Care, Other (non HMO) | Source: Ambulatory Visit

## 2019-11-12 ENCOUNTER — Telehealth: Payer: Self-pay | Admitting: *Deleted

## 2019-11-12 NOTE — Telephone Encounter (Signed)
Patient called to inquire on PD1 result and if she needs to take the home decadron for her treatment. Informed her that the PD1 result should be here by Thursday or Friday at the latest. If no result by her tx on 11/14/19, MD will give single agent Abraxane that day. She does not need the decadron premeds at home for this treatment.

## 2019-11-12 NOTE — Telephone Encounter (Addendum)
Called to report that she has been monitoring her pulse over the weekend and it has ranged ~ 111-114 at rest. Today during phone conversation it is 120. Does not feel as if it is skipping any beats. Breathing much improved after her thoracentesis. She denies any s/s of bleeding. Asking if it is a side effect of Lovenox or what is causing this? Also asking when MD will convert her to once daily dosing on the Lovenox. Per Dr. Benay Spice: Tachycardia is most likely due to cancer and her deconditioning and mild anemia. Daily dose of Lovenox was ordered on 11/08/19. Confirmed w/pharmacy that med is ready for pick up with no PA required. Notified patient of potential cause of tachycardia and that she will again need to waste small amount of Lovenox for her daily dose.

## 2019-11-12 NOTE — Addendum Note (Signed)
Addended by: Tania Ade on: 11/12/2019 02:52 PM   Modules accepted: Orders

## 2019-11-13 ENCOUNTER — Telehealth: Payer: Self-pay | Admitting: *Deleted

## 2019-11-13 ENCOUNTER — Telehealth: Payer: Self-pay | Admitting: Nurse Practitioner

## 2019-11-13 ENCOUNTER — Other Ambulatory Visit: Payer: Self-pay | Admitting: *Deleted

## 2019-11-13 DIAGNOSIS — Z171 Estrogen receptor negative status [ER-]: Secondary | ICD-10-CM

## 2019-11-13 DIAGNOSIS — C50412 Malignant neoplasm of upper-outer quadrant of left female breast: Secondary | ICD-10-CM

## 2019-11-13 DIAGNOSIS — I82A12 Acute embolism and thrombosis of left axillary vein: Secondary | ICD-10-CM

## 2019-11-13 NOTE — Telephone Encounter (Signed)
I left a message about flush 2/3 added

## 2019-11-13 NOTE — Telephone Encounter (Signed)
Called to inquire if she needs to take home dexamethasone tonight and tomorrow with the Abraxane? Also asking if she will receive the immunotherapy tomorrow as well?  Informed her that she does not need the dexamethasone with this new regimen. Will receive Abraxane only if PD1 results are not back yet and can add it to the next treatment when results are obtained.

## 2019-11-14 ENCOUNTER — Inpatient Hospital Stay: Payer: 59

## 2019-11-14 ENCOUNTER — Other Ambulatory Visit: Payer: Self-pay

## 2019-11-14 ENCOUNTER — Encounter: Payer: Self-pay | Admitting: Nurse Practitioner

## 2019-11-14 ENCOUNTER — Inpatient Hospital Stay: Payer: 59 | Attending: Oncology | Admitting: Nurse Practitioner

## 2019-11-14 VITALS — HR 123

## 2019-11-14 VITALS — BP 105/69 | HR 128 | Temp 98.1°F | Resp 17 | Ht 61.0 in | Wt 156.2 lb

## 2019-11-14 DIAGNOSIS — C50412 Malignant neoplasm of upper-outer quadrant of left female breast: Secondary | ICD-10-CM

## 2019-11-14 DIAGNOSIS — Z171 Estrogen receptor negative status [ER-]: Secondary | ICD-10-CM | POA: Diagnosis not present

## 2019-11-14 DIAGNOSIS — Z5111 Encounter for antineoplastic chemotherapy: Secondary | ICD-10-CM | POA: Diagnosis present

## 2019-11-14 DIAGNOSIS — Z95828 Presence of other vascular implants and grafts: Secondary | ICD-10-CM | POA: Insufficient documentation

## 2019-11-14 DIAGNOSIS — C50912 Malignant neoplasm of unspecified site of left female breast: Secondary | ICD-10-CM | POA: Diagnosis present

## 2019-11-14 DIAGNOSIS — Z79899 Other long term (current) drug therapy: Secondary | ICD-10-CM | POA: Diagnosis not present

## 2019-11-14 DIAGNOSIS — I82A12 Acute embolism and thrombosis of left axillary vein: Secondary | ICD-10-CM

## 2019-11-14 DIAGNOSIS — D630 Anemia in neoplastic disease: Secondary | ICD-10-CM | POA: Diagnosis not present

## 2019-11-14 DIAGNOSIS — C7952 Secondary malignant neoplasm of bone marrow: Secondary | ICD-10-CM | POA: Insufficient documentation

## 2019-11-14 LAB — CMP (CANCER CENTER ONLY)
ALT: 54 U/L — ABNORMAL HIGH (ref 0–44)
AST: 37 U/L (ref 15–41)
Albumin: 2.9 g/dL — ABNORMAL LOW (ref 3.5–5.0)
Alkaline Phosphatase: 235 U/L — ABNORMAL HIGH (ref 38–126)
Anion gap: 12 (ref 5–15)
BUN: 18 mg/dL (ref 6–20)
CO2: 28 mmol/L (ref 22–32)
Calcium: 9.6 mg/dL (ref 8.9–10.3)
Chloride: 99 mmol/L (ref 98–111)
Creatinine: 0.78 mg/dL (ref 0.44–1.00)
GFR, Est AFR Am: >60 mL/min (ref >60)
GFR, Estimated: >60 mL/min (ref >60)
Glucose, Bld: 120 mg/dL — ABNORMAL HIGH (ref 70–99)
Potassium: 3.7 mmol/L (ref 3.5–5.1)
Sodium: 139 mmol/L (ref 135–145)
Total Bilirubin: 0.4 mg/dL (ref 0.3–1.2)
Total Protein: 7.2 g/dL (ref 6.5–8.1)

## 2019-11-14 LAB — CBC WITH DIFFERENTIAL (CANCER CENTER ONLY)
Abs Immature Granulocytes: 0.55 10*3/uL — ABNORMAL HIGH (ref 0.00–0.07)
Basophils Absolute: 0.1 10*3/uL (ref 0.0–0.1)
Basophils Relative: 1 %
Eosinophils Absolute: 0.3 10*3/uL (ref 0.0–0.5)
Eosinophils Relative: 3 %
HCT: 31.2 % — ABNORMAL LOW (ref 36.0–46.0)
Hemoglobin: 9.3 g/dL — ABNORMAL LOW (ref 12.0–15.0)
Immature Granulocytes: 6 %
Lymphocytes Relative: 24 %
Lymphs Abs: 2.1 10*3/uL (ref 0.7–4.0)
MCH: 27.3 pg (ref 26.0–34.0)
MCHC: 29.8 g/dL — ABNORMAL LOW (ref 30.0–36.0)
MCV: 91.5 fL (ref 80.0–100.0)
Monocytes Absolute: 1.1 10*3/uL — ABNORMAL HIGH (ref 0.1–1.0)
Monocytes Relative: 12 %
Neutro Abs: 4.7 10*3/uL (ref 1.7–7.7)
Neutrophils Relative %: 54 %
Platelet Count: 187 10*3/uL (ref 150–400)
RBC: 3.41 MIL/uL — ABNORMAL LOW (ref 3.87–5.11)
RDW: 15.9 % — ABNORMAL HIGH (ref 11.5–15.5)
WBC Count: 8.8 10*3/uL (ref 4.0–10.5)
nRBC: 10.3 % — ABNORMAL HIGH (ref 0.0–0.2)

## 2019-11-14 LAB — SAMPLE TO BLOOD BANK

## 2019-11-14 LAB — TSH: TSH: 1.341 u[IU]/mL (ref 0.308–3.960)

## 2019-11-14 LAB — GENETIC SCREENING ORDER

## 2019-11-14 LAB — CYTOLOGY - NON PAP

## 2019-11-14 MED ORDER — PROCHLORPERAZINE MALEATE 10 MG PO TABS
10.0000 mg | ORAL_TABLET | Freq: Once | ORAL | Status: AC
  Administered 2019-11-14: 10 mg via ORAL

## 2019-11-14 MED ORDER — SODIUM CHLORIDE 0.9 % IV SOLN
Freq: Once | INTRAVENOUS | Status: AC
  Filled 2019-11-14: qty 250

## 2019-11-14 MED ORDER — SODIUM CHLORIDE 0.9% FLUSH
10.0000 mL | INTRAVENOUS | Status: DC | PRN
  Administered 2019-11-14: 16:00:00 10 mL
  Filled 2019-11-14: qty 10

## 2019-11-14 MED ORDER — PACLITAXEL PROTEIN-BOUND CHEMO INJECTION 100 MG
100.0000 mg/m2 | Freq: Once | INTRAVENOUS | Status: AC
  Administered 2019-11-14: 175 mg via INTRAVENOUS
  Filled 2019-11-14: qty 35

## 2019-11-14 MED ORDER — PROCHLORPERAZINE MALEATE 10 MG PO TABS
ORAL_TABLET | ORAL | Status: AC
  Filled 2019-11-14: qty 1

## 2019-11-14 MED ORDER — SODIUM CHLORIDE 0.9% FLUSH
10.0000 mL | Freq: Once | INTRAVENOUS | Status: AC
  Administered 2019-11-14: 10 mL
  Filled 2019-11-14: qty 10

## 2019-11-14 MED ORDER — HEPARIN SOD (PORK) LOCK FLUSH 100 UNIT/ML IV SOLN
500.0000 [IU] | Freq: Once | INTRAVENOUS | Status: AC | PRN
  Administered 2019-11-14: 16:00:00 500 [IU]
  Filled 2019-11-14: qty 5

## 2019-11-14 NOTE — Progress Notes (Addendum)
Abigail [name]   Diagnosis: Breast cancer  INTERVAL HISTORY:   Abigail [name] returns as scheduled.  She underwent a thoracentesis 11/09/2019.  She notes she is less short of breath since the procedure.  Arm continues to be swollen and painful.  No bleeding.  She denies nausea/vomiting.  No mouth sores.  No diarrhea.  She has noted the skin at the left upper back feels abnormal.  Objective:  Vital signs in last 24 hours:  Blood pressure 105/69, pulse (!) 128, temperature 98.1 F (36.7 C), temperature source Temporal, resp. rate 17, height '5\' 1"'$  (1.549 m), weight 156 lb 3.2 oz (70.9 kg), SpO2 99 %.    HEENT: No thrush or ulcers. Resp: Breath sounds diminished left lower lung field.  No respiratory distress. Cardio: Regular, tachycardic. GI: Abdomen soft and nontender. Vascular: No leg edema.  Entire left arm is edematous. Neuro: Alert and oriented. Skin: Nodularity left upper back. Port-A-Cath without erythema.  Lab Results:  Lab Results  Component Value Date   WBC 8.8 11/14/2019   HGB 9.3 (L) 11/14/2019   HCT 31.2 (L) 11/14/2019   MCV 91.5 11/14/2019   PLT 187 11/14/2019   NEUTROABS PENDING 11/14/2019    Imaging:  No results found.  Medications: I have reviewed the patient's current medications.  Assessment/Plan: 1.Metastatic breast cancer  CT chest 10/24/2018-left breast mass left axillary lymph nodes, abnormal appearance of the left concerning for tumor involvement, mediastinal adenopathy, diffuse sclerotic and lytic lesions, left pleural effusion  CT abdomen/pelvis 10/25/1998 enhancing left breast mass with left axillary lymphadenopathy and tumor involved the left chest wall the latissimus dorsi segment 4B liver lesion, diffuse lytic/sclerotic lesions  Ultrasound-guided biopsy of left liver lesion 10/29/1999-metastatic carcinoma, cytokeratin andGATA3positive consistent with metastatic breast cancer. HER-2 negative, ER negative, PR  negative, Ki-6720% 2. Left upper extremity DVT-left subclavian, axillary, and brachial vein thrombosis confirmed on  Anticoagulation with apixaban, changed to parenteral hospital admission1/14/2021transition to twice daily Lovenox at discharge; transition to once daily 11/08/2019. 3.Anemia-likely secondary to metastatic breast cancer involving the bone marrow 4.Left pleural effusion-thoracentesis 11/09/2019, cytology positive for malignant cells 5.   Port-A-Cath placement 11/07/2019, interventional radiology 6.   2D echo 11/08/2019-ejection fraction 55 to 60%, no pericardial effusion 7.   Tachycardia-likely multifactorial secondary to cancer burden, anemia, pleural effusion  Disposition: Abigail [name] appears unchanged.  PD-L1 result is pending.  Plan to proceed with Abraxane alone today.  We again reviewed potential toxicities associated with Abraxane including bone marrow toxicity, nausea, diarrhea or constipation, hair loss, neuropathy symptoms.  She agrees to proceed.  If the PD-L1 result indicates she is a good candidate for immunotherapy the plan is to add atezolizumab with her next treatment.  She will return for lab, follow-up, day 8 Abraxane, possibly atezolizumab in 1 week.  She will contact the office in the interim with any problems.  Patient seen with Dr. Benay Spice.    Ned Card ANP/GNP-BC   11/14/2019  11:57 AM  This was a shared visit with Ned Card.  Abigail [name] appears unchanged.  She has persistent tachycardia, likely related to the tumor burden, anemia, and pleural effusion.  We have a low suspicion for pulmonary embolism as she is maintained on anticoagulation therapy and a chest CT was negative for pulmonary embolism several weeks ago.  We are waiting on results of PD1 testing.  She will begin treatment with Abraxane today.  The plan is to add atezolizumab if the PD1 returns positive.  [name],  MD

## 2019-11-14 NOTE — Patient Instructions (Addendum)
[facility] Discharge Instructions for Patients Receiving Chemotherapy  Today you received the following chemotherapy agents Abraxane   To help prevent nausea and vomiting after your treatment, we encourage you to take your nausea medication as directed.    If you develop nausea and vomiting that is not controlled by your nausea medication, call the clinic.   BELOW ARE SYMPTOMS THAT SHOULD BE REPORTED IMMEDIATELY:  *FEVER GREATER THAN 100.5 F  *CHILLS WITH OR WITHOUT FEVER  NAUSEA AND VOMITING THAT IS NOT CONTROLLED WITH YOUR NAUSEA MEDICATION  *UNUSUAL SHORTNESS OF BREATH  *UNUSUAL BRUISING OR BLEEDING  TENDERNESS IN MOUTH AND THROAT WITH OR WITHOUT PRESENCE OF ULCERS  *URINARY PROBLEMS  *BOWEL PROBLEMS  UNUSUAL RASH Items with * indicate a potential emergency and should be followed up as soon as possible.  Feel free to call the clinic should you have any questions or concerns. The clinic phone number is [phone].  Please show the CHEMO ALERT CARD at check-in to the Emergency Department and triage nurse.  Nanoparticle Albumin-Bound Paclitaxel injection What is this medicine? NANOPARTICLE ALBUMIN-BOUND PACLITAXEL (Na no PAHR ti kuhl al BYOO muhn-bound PAK li TAX el) is a chemotherapy drug. It targets fast dividing cells, like cancer cells, and causes these cells to die. This medicine is used to treat advanced breast cancer, lung cancer, and pancreatic cancer. This medicine may be used for other purposes; ask your health care provider or pharmacist if you have questions. COMMON BRAND NAME(S): Abraxane What should I tell my health care provider before I take this medicine? They need to know if you have any of these conditions:  kidney disease  liver disease  low blood counts, like low white cell, platelet, or red cell counts  lung or breathing disease, like asthma  tingling of the fingers or toes, or other nerve disorder  an unusual or allergic  reaction to paclitaxel, albumin, other chemotherapy, other medicines, foods, dyes, or preservatives  pregnant or trying to get pregnant  breast-feeding How should I use this medicine? This drug is given as an infusion into a vein. It is administered in a hospital or clinic by a specially trained health care professional. Talk to your pediatrician regarding the use of this medicine in children. Special care may be needed. Overdosage: If you think you have taken too much of this medicine contact a poison control center or emergency room at once. NOTE: This medicine is only for you. Do not share this medicine with others. What if I miss a dose? It is important not to miss your dose. Call your doctor or health care professional if you are unable to keep an appointment. What may interact with this medicine? This medicine may interact with the following medications:  antiviral medicines for hepatitis, HIV or AIDS  certain antibiotics like erythromycin and clarithromycin  certain medicines for fungal infections like ketoconazole and itraconazole  certain medicines for seizures like carbamazepine, phenobarbital, phenytoin  gemfibrozil  nefazodone  rifampin  St. John's wort This list may not describe all possible interactions. Give your health care provider a list of all the medicines, herbs, non-prescription drugs, or dietary supplements you use. Also tell them if you smoke, drink alcohol, or use illegal drugs. Some items may interact with your medicine. What should I watch for while using this medicine? Your condition will be monitored carefully while you are receiving this medicine. You will need important blood work done while you are taking this medicine. This medicine can cause   serious allergic reactions. If you experience allergic reactions like skin rash, itching or hives, swelling of the face, lips, or tongue, tell your doctor or health care professional right away. In some cases,  you may be given additional medicines to help with side effects. Follow all directions for their use. This drug may make you feel generally unwell. This is not uncommon, as chemotherapy can affect healthy cells as well as cancer cells. Report any side effects. Continue your course of treatment even though you feel ill unless your doctor tells you to stop. Call your doctor or health care professional for advice if you get a fever, chills or sore throat, or other symptoms of a cold or flu. Do not treat yourself. This drug decreases your body's ability to fight infections. Try to avoid being around people who are sick. This medicine may increase your risk to bruise or bleed. Call your doctor or health care professional if you notice any unusual bleeding. Be careful brushing and flossing your teeth or using a toothpick because you may get an infection or bleed more easily. If you have any dental work done, tell your dentist you are receiving this medicine. Avoid taking products that contain aspirin, acetaminophen, ibuprofen, naproxen, or ketoprofen unless instructed by your doctor. These medicines may hide a fever. Do not become pregnant while taking this medicine or for 6 months after stopping it. Women should inform their doctor if they wish to become pregnant or think they might be pregnant. Men should not father a child while taking this medicine or for 3 months after stopping it. There is a potential for serious side effects to an unborn child. Talk to your health care professional or pharmacist for more information. Do not breast-feed an infant while taking this medicine or for 2 weeks after stopping it. This medicine may interfere with the ability to get pregnant or to father a child. You should talk to your doctor or health care professional if you are concerned about your fertility. What side effects may I notice from receiving this medicine? Side effects that you should report to your doctor or  health care professional as soon as possible:  allergic reactions like skin rash, itching or hives, swelling of the face, lips, or tongue  breathing problems  changes in vision  fast, irregular heartbeat  low blood pressure  mouth sores  pain, tingling, numbness in the hands or feet  signs of decreased platelets or bleeding - bruising, pinpoint red spots on the skin, black, tarry stools, blood in the urine  signs of decreased red blood cells - unusually weak or tired, feeling faint or lightheaded, falls  signs of infection - fever or chills, cough, sore throat, pain or difficulty passing urine  signs and symptoms of liver injury like dark yellow or brown urine; general ill feeling or flu-like symptoms; light-colored stools; loss of appetite; nausea; right upper belly pain; unusually weak or tired; yellowing of the eyes or skin  swelling of the ankles, feet, hands  unusually slow heartbeat Side effects that usually do not require medical attention (report to your doctor or health care professional if they continue or are bothersome):  diarrhea  hair loss  loss of appetite  nausea, vomiting  tiredness This list may not describe all possible side effects. Call your doctor for medical advice about side effects. You may report side effects to FDA at 1-800-FDA-1088. Where should I keep my medicine? This drug is given in a hospital or clinic and   will not be stored at home. NOTE: This sheet is a summary. It may not cover all possible information. If you have questions about this medicine, talk to your doctor, pharmacist, or health care provider.  2020 Elsevier/Gold Standard (2017-05-31 13:03:45)  

## 2019-11-14 NOTE — Progress Notes (Signed)
Spoke w/ Lattie Haw and Dr. Benay Spice. Patient will receive abraxane only today. PDL1 studies still pending. Josem Kaufmann is okay for Abraxane per Brandi.   Demetrius Charity, PharmD, [facility] Pharmacist Pharmacy Phone: [phone] 11/14/2019

## 2019-11-14 NOTE — Progress Notes (Signed)
Per Ned Card, NP okay to proceed with elevated HR.

## 2019-11-15 ENCOUNTER — Telehealth: Payer: Self-pay | Admitting: Oncology

## 2019-11-15 ENCOUNTER — Telehealth: Payer: Self-pay | Admitting: *Deleted

## 2019-11-15 NOTE — Telephone Encounter (Signed)
Scheduled per los. Called and spoke with patient. Confirmed appt 

## 2019-11-16 ENCOUNTER — Encounter (HOSPITAL_COMMUNITY): Payer: Self-pay | Admitting: Oncology

## 2019-11-16 ENCOUNTER — Telehealth: Payer: Self-pay | Admitting: *Deleted

## 2019-11-16 NOTE — Telephone Encounter (Signed)
Received fax from [facility] suggesting to get the 110 mg Lovenox dose most accurately, she needs to administer two injections ( 80 mg and 30 mg ). Dr. Benay Spice feels if patient is comfortable with the current 120 mg syringe to waste up to 0.73 to 0.75, she does not need to have two injections. Patient feels very comfortable with how to waste drug in syringe to obtain the ordered dose. Will make no changes at this time. Patient also reports she is doing well at home. Eating well and bowels are moving. Informed her that we have sent message to pathology to f/u on PD1 result.

## 2019-11-18 ENCOUNTER — Other Ambulatory Visit: Payer: Self-pay | Admitting: Oncology

## 2019-11-20 NOTE — Progress Notes (Addendum)
[place]   Telephone:([phone] [phone]   Clinic Follow up Note   Patient Care Team: Hoyt Koch, MD as PCP - General (Internal Medicine) 11/21/2019  CHIEF COMPLAINT: F/u breast cancer   INTERVAL HISTORY: Ms. Mctague returns for f/u and treatment as scheduled. She completed cycle 1 day 1 abraxane on 11/14/19. She did well with treatment. Energy level increased slightly, she is mobile at home. The edema in left arm has "shifted;" rates pain 6 or 7/10 which is stable. Denies worsening swelling. Continues lovenox which she tolerates. Denies bleeding. Denies n/v/c/d, mucositis, neuropathy, rash, fever, chills, worsening cough or chest pain. Intermittent DOE at baseline is stable.    MEDICAL HISTORY:  Past Medical History:  Diagnosis Date  . Allergic rhinitis   . Hyperlipidemia    LDL 150's '11    SURGICAL HISTORY: Past Surgical History:  Procedure Laterality Date  . CESAREAN SECTION    . IR IMAGING GUIDED PORT INSERTION  11/07/2019  . IR THORACENTESIS ASP PLEURAL SPACE W/IMG GUIDE  11/09/2019  . IR US GUIDE BX ASP/DRAIN  10/29/2019    I have reviewed the social history and family history with the patient and they are unchanged from previous note.  ALLERGIES:  is allergic to shellfish allergy and sulfamethoxazole.  MEDICATIONS:  Current Outpatient Medications  Medication Sig Dispense Refill  . diclofenac (VOLTAREN) 75 MG EC tablet Take 75 mg by mouth 2 (two) times daily.    Marland Kitchen enoxaparin (LOVENOX) 120 MG/0.8ML injection Inject 0.73 mLs (110 mg total) into the skin daily. 24 mL 2  . HYDROcodone-acetaminophen (NORCO/VICODIN) 5-325 MG tablet Take 1 tablet by mouth every 8 (eight) hours as needed for moderate pain or severe pain. 50 tablet 0  . lidocaine-prilocaine (EMLA) cream Apply 1 application topically as directed. Apply to port 1 hour prior to stick and cover with plastic wrap 30 g 5  . ondansetron (ZOFRAN) 8 MG tablet Take 1 tablet (8 mg  total) by mouth every 8 (eight) hours as needed for nausea or vomiting. 30 tablet 1  . prochlorperazine (COMPAZINE) 10 MG tablet Take 1 tablet (10 mg total) by mouth every 6 (six) hours as needed for nausea. 60 tablet 1  . methocarbamol (ROBAXIN) 500 MG tablet Take 500 mg by mouth 4 (four) times daily.     No current facility-administered medications for this visit.   Facility-Administered Medications Ordered in Other Visits  Medication Dose Route Frequency Provider Last Rate Last Admin  . 0.9 %  sodium chloride infusion (Manually program via Guardrails IV Fluids)  250 mL Intravenous Once Cira Rue K, NP      . 0.9 %  sodium chloride infusion   Intravenous Once Ladell Pier, MD      . heparin lock flush 100 unit/mL  500 Units Intracatheter Once PRN Ladell Pier, MD      . heparin lock flush 100 unit/mL  250 Units Intracatheter PRN Alla Feeling, NP      . PACLitaxel-protein bound (ABRAXANE) chemo infusion 175 mg  100 mg/m2 (Treatment Plan Recorded) Intravenous Once Ladell Pier, MD      . prochlorperazine (COMPAZINE) tablet 10 mg  10 mg Oral Once Betsy Coder B, MD      . sodium chloride flush (NS) 0.9 % injection 10 mL  10 mL Intracatheter PRN Ladell Pier, MD      . sodium chloride flush (NS) 0.9 % injection 3 mL  3 mL Intracatheter PRN Kalman Shan,  Wilhemina Cash, NP        PHYSICAL EXAMINATION:  Vitals:   11/21/19 0917 11/21/19 0920  BP: 112/75   Pulse: (!) 109 (!) 105  Resp: 18   Temp: 98.1 F (36.7 C)   SpO2: 99%    Filed Weights   11/21/19 0917  Weight: 159 lb 6.4 oz (72.3 kg)    GENERAL:alert, no distress and comfortable SKIN: no obvious rash  EYES: sclera clear NECK: without mass LUNGS: diminished left lung base, otherwise clear throughout with normal breathing effort HEART: tachycardic with regular rhythm, no lower extremity edema ABDOMEN: abdomen soft, non-tender and normal bowel sounds Musculoskeletal: limited strength and ROM in left u NEURO: alert &  oriented x 3 with fluent speech, normal gait  PAC without erythema  Breast exam performed by Dr. Benay Spice reveals stable nodularity tracking the left lateral breast, axilla and left upper back. Breast tissue slightly softer  LABORATORY DATA:  I have reviewed the data as listed CBC Latest Ref Rng & Units 11/21/2019 11/14/2019 11/07/2019  WBC 4.0 - 10.5 K/uL 5.3 8.8 8.9  Hemoglobin 12.0 - 15.0 g/dL 7.5(L) 9.3(L) 10.3(L)  Hematocrit 36.0 - 46.0 % 24.9(L) 31.2(L) 34.5(L)  Platelets 150 - 400 K/uL 206 187 238     CMP Latest Ref Rng & Units 11/21/2019 11/14/2019 11/05/2019  Glucose 70 - 99 mg/dL 105(H) 120(H) 108(H)  BUN 6 - 20 mg/dL '14 18 15  ' Creatinine 0.44 - 1.00 mg/dL 0.69 0.78 0.80  Sodium 135 - 145 mmol/L 140 139 144  Potassium 3.5 - 5.1 mmol/L 3.9 3.7 3.7  Chloride 98 - 111 mmol/L 102 99 102  CO2 22 - 32 mmol/L '28 28 30  ' Calcium 8.9 - 10.3 mg/dL 9.4 9.6 9.5  Total Protein 6.5 - 8.1 g/dL 6.8 7.2 7.2  Total Bilirubin 0.3 - 1.2 mg/dL 0.3 0.4 0.3  Alkaline Phos 38 - 126 U/L 195(H) 235(H) 250(H)  AST 15 - 41 U/L 61(H) 37 44(H)  ALT 0 - 44 U/L 75(H) 54(H) 61(H)      RADIOGRAPHIC STUDIES: I have personally reviewed the radiological images as listed and agreed with the findings in the report. No results found.   ASSESSMENT & PLAN:   Assessment/Plan: 1.Metastatic breast cancer  CT chest 10/25/2019-left breast mass left axillary lymph nodes, abnormal appearance of the left concerning for tumor involvement, mediastinal adenopathy, diffuse sclerotic and lytic lesions, left pleural effusion  CT abdomen/pelvis 10/26/2019 enhancing left breast mass with left axillary lymphadenopathy and tumor involved the left chest wall the latissimus dorsi segment 4B liver lesion, diffuse lytic/sclerotic lesions  Ultrasound-guided biopsy of left liver lesion 10/29/1999-metastatic carcinoma, cytokeratin andGATA3positive consistent with metastatic breast cancer. HER-2 negative, ER negative, PR negative,  Ki-6720%. PD-L1 <1% expression   Initiated systemic chemotherapy weekly abraxane 100 mg/m2 days 1, 8, 15 q28 days   Cycle 1 day 1 on 11/14/19  Cycle 1 day 8 on 11/21/19 (hg 7.5, receive 1 unit RBCs) 2. Left upper extremity DVT-left subclavian, axillary, and brachial vein thrombosis confirmed on  Anticoagulation with apixaban, changed to parenteral hospital admission1/14/2021transition to twice daily Lovenox at discharge; transition to once daily 11/08/2019. 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   2D echo 11/08/2019-ejection fraction 55 to 60%, no pericardial effusion 7.   Tachycardia-likely multifactorial secondary to cancer burden, anemia, pleural effusion  Disposition:  Ms. Strand appears stable. She tolerated cycle 1 day 1 Abraxane  without difficulty. Clinically her left arm edema and breast exam are stable overall. I reviewed her PD-L1 expression <1%, indicating she is not a candidate for immunotherapy with atezolizumab. She will continue single agent Abraxane.   Labs reviewed; LFTs are stable. She has worsening anemia with Hgb 7.5, related to bone marrow involvement and chemotherapy. We reviewed risk/benefit of blood transfusion including potential for fluid overload, liver disease, and allergic reaction. She consented. Will give 1 unit RBC with cycle 1 day 8 Abraxane today. The patient was seen with Dr. Benay Spice; pharmacy and treating RN were notified of the plan.   She will return for follow up with Dr. Benay Spice and C1day 15 Abraxane in 1 week.   No problem-specific Assessment & Plan notes found for this encounter.   Orders Placed This Encounter  Procedures  . Practitioner attestation of consent    I, the ordering practitioner, attest that I have discussed with the patient the  benefits, risks, side effects, alternatives, likelihood of achieving goals and potential problems during recovery for the procedure listed.    Standing Status:   Future    Standing Expiration Date:   11/20/2020    Order Specific Question:   Procedure    Answer:   Blood Product(s)  . Complete patient signature process for consent form    Standing Status:   Future    Standing Expiration Date:   11/20/2020  . Care order/instruction    Transfuse Parameters    Standing Status:   Future    Standing Expiration Date:   11/20/2020  . Type and screen    Standing Status:   Future    Number of Occurrences:   1    Standing Expiration Date:   11/20/2020   All questions were answered. The patient knows to call the clinic with any problems, questions or concerns. No barriers to learning was detected.     Alla Feeling, NP 11/21/19  This was a shared visit with Cira Rue.  Ms. Vasbinder was interviewed and examined.  She tolerated the first treatment with Abraxane well.  She will complete day 8 chemotherapy today.  She will not be a candidate for concurrent immunotherapy since the tumor returned PD1 negative.  We discussed the treatment plan with Ms. Kludt and her family.  She has severe anemia secondary to metastatic breast cancer involving the bones.  She will be transfused with 1 unit of packed red cells today.  [name], MD

## 2019-11-21 ENCOUNTER — Telehealth: Payer: Self-pay | Admitting: Nurse Practitioner

## 2019-11-21 ENCOUNTER — Inpatient Hospital Stay: Payer: 59

## 2019-11-21 ENCOUNTER — Encounter: Payer: Self-pay | Admitting: Nurse Practitioner

## 2019-11-21 ENCOUNTER — Inpatient Hospital Stay (HOSPITAL_BASED_OUTPATIENT_CLINIC_OR_DEPARTMENT_OTHER): Payer: 59 | Admitting: Nurse Practitioner

## 2019-11-21 ENCOUNTER — Other Ambulatory Visit: Payer: Self-pay

## 2019-11-21 ENCOUNTER — Other Ambulatory Visit: Payer: Self-pay | Admitting: *Deleted

## 2019-11-21 VITALS — BP 102/72 | HR 104 | Temp 99.2°F | Resp 18

## 2019-11-21 VITALS — BP 112/75 | HR 105 | Temp 98.1°F | Resp 18 | Ht 61.0 in | Wt 159.4 lb

## 2019-11-21 DIAGNOSIS — C50412 Malignant neoplasm of upper-outer quadrant of left female breast: Secondary | ICD-10-CM

## 2019-11-21 DIAGNOSIS — Z95828 Presence of other vascular implants and grafts: Secondary | ICD-10-CM

## 2019-11-21 DIAGNOSIS — Z171 Estrogen receptor negative status [ER-]: Secondary | ICD-10-CM

## 2019-11-21 DIAGNOSIS — Z5111 Encounter for antineoplastic chemotherapy: Secondary | ICD-10-CM | POA: Diagnosis not present

## 2019-11-21 LAB — CBC WITH DIFFERENTIAL (CANCER CENTER ONLY)
Abs Immature Granulocytes: 0.31 10*3/uL — ABNORMAL HIGH (ref 0.00–0.07)
Basophils Absolute: 0.1 10*3/uL (ref 0.0–0.1)
Basophils Relative: 1 %
Eosinophils Absolute: 0.2 10*3/uL (ref 0.0–0.5)
Eosinophils Relative: 4 %
HCT: 24.9 % — ABNORMAL LOW (ref 36.0–46.0)
Hemoglobin: 7.5 g/dL — ABNORMAL LOW (ref 12.0–15.0)
Immature Granulocytes: 6 %
Lymphocytes Relative: 35 %
Lymphs Abs: 1.8 10*3/uL (ref 0.7–4.0)
MCH: 27.2 pg (ref 26.0–34.0)
MCHC: 30.1 g/dL (ref 30.0–36.0)
MCV: 90.2 fL (ref 80.0–100.0)
Monocytes Absolute: 0.7 10*3/uL (ref 0.1–1.0)
Monocytes Relative: 13 %
Neutro Abs: 2.3 10*3/uL (ref 1.7–7.7)
Neutrophils Relative %: 41 %
Platelet Count: 206 10*3/uL (ref 150–400)
RBC: 2.76 MIL/uL — ABNORMAL LOW (ref 3.87–5.11)
RDW: 16.4 % — ABNORMAL HIGH (ref 11.5–15.5)
WBC Count: 5.3 10*3/uL (ref 4.0–10.5)
nRBC: 26.1 % — ABNORMAL HIGH (ref 0.0–0.2)

## 2019-11-21 LAB — CMP (CANCER CENTER ONLY)
ALT: 75 U/L — ABNORMAL HIGH (ref 0–44)
AST: 61 U/L — ABNORMAL HIGH (ref 15–41)
Albumin: 2.7 g/dL — ABNORMAL LOW (ref 3.5–5.0)
Alkaline Phosphatase: 195 U/L — ABNORMAL HIGH (ref 38–126)
Anion gap: 10 (ref 5–15)
BUN: 14 mg/dL (ref 6–20)
CO2: 28 mmol/L (ref 22–32)
Calcium: 9.4 mg/dL (ref 8.9–10.3)
Chloride: 102 mmol/L (ref 98–111)
Creatinine: 0.69 mg/dL (ref 0.44–1.00)
GFR, Est AFR Am: >60 mL/min (ref >60)
GFR, Estimated: >60 mL/min (ref >60)
Glucose, Bld: 105 mg/dL — ABNORMAL HIGH (ref 70–99)
Potassium: 3.9 mmol/L (ref 3.5–5.1)
Sodium: 140 mmol/L (ref 135–145)
Total Bilirubin: 0.3 mg/dL (ref 0.3–1.2)
Total Protein: 6.8 g/dL (ref 6.5–8.1)

## 2019-11-21 LAB — PREPARE RBC (CROSSMATCH)

## 2019-11-21 LAB — ABO/RH: ABO/RH(D): B POS

## 2019-11-21 MED ORDER — SODIUM CHLORIDE 0.9 % IV SOLN
Freq: Once | INTRAVENOUS | Status: AC
  Filled 2019-11-21: qty 250

## 2019-11-21 MED ORDER — HEPARIN SOD (PORK) LOCK FLUSH 100 UNIT/ML IV SOLN
500.0000 [IU] | Freq: Once | INTRAVENOUS | Status: AC | PRN
  Administered 2019-11-21: 15:00:00 500 [IU]
  Filled 2019-11-21: qty 5

## 2019-11-21 MED ORDER — HEPARIN SOD (PORK) LOCK FLUSH 100 UNIT/ML IV SOLN
250.0000 [IU] | INTRAVENOUS | Status: DC | PRN
  Filled 2019-11-21: qty 5

## 2019-11-21 MED ORDER — SODIUM CHLORIDE 0.9% FLUSH
10.0000 mL | Freq: Once | INTRAVENOUS | Status: AC
  Administered 2019-11-21: 10 mL
  Filled 2019-11-21: qty 10

## 2019-11-21 MED ORDER — PROCHLORPERAZINE MALEATE 10 MG PO TABS
ORAL_TABLET | ORAL | Status: AC
  Filled 2019-11-21: qty 1

## 2019-11-21 MED ORDER — PROCHLORPERAZINE MALEATE 10 MG PO TABS
10.0000 mg | ORAL_TABLET | Freq: Once | ORAL | Status: AC
  Administered 2019-11-21: 10:00:00 10 mg via ORAL

## 2019-11-21 MED ORDER — PACLITAXEL PROTEIN-BOUND CHEMO INJECTION 100 MG
100.0000 mg/m2 | Freq: Once | INTRAVENOUS | Status: AC
  Administered 2019-11-21: 11:00:00 175 mg via INTRAVENOUS
  Filled 2019-11-21: qty 35

## 2019-11-21 MED ORDER — SODIUM CHLORIDE 0.9% FLUSH
10.0000 mL | INTRAVENOUS | Status: DC | PRN
  Administered 2019-11-21: 15:00:00 10 mL
  Filled 2019-11-21: qty 10

## 2019-11-21 MED ORDER — SODIUM CHLORIDE 0.9% FLUSH
3.0000 mL | INTRAVENOUS | Status: DC | PRN
  Filled 2019-11-21: qty 10

## 2019-11-21 MED ORDER — SODIUM CHLORIDE 0.9% IV SOLUTION
250.0000 mL | Freq: Once | INTRAVENOUS | Status: DC
  Filled 2019-11-21: qty 250

## 2019-11-21 NOTE — Patient Instructions (Addendum)
[facility] Discharge Instructions for Patients Receiving Chemotherapy  Today you received the following chemotherapy agents Abraxane To help prevent nausea and vomiting after your treatment, we encourage you to take your nausea medication .   If you develop nausea and vomiting that is not controlled by your nausea medication, call the clinic.   BELOW ARE SYMPTOMS THAT SHOULD BE REPORTED IMMEDIATELY:  *FEVER GREATER THAN 100.5 F  *CHILLS WITH OR WITHOUT FEVER  NAUSEA AND VOMITING THAT IS NOT CONTROLLED WITH YOUR NAUSEA MEDICATION  *UNUSUAL SHORTNESS OF BREATH  *UNUSUAL BRUISING OR BLEEDING  TENDERNESS IN MOUTH AND THROAT WITH OR WITHOUT PRESENCE OF ULCERS  *URINARY PROBLEMS  *BOWEL PROBLEMS  UNUSUAL RASH Items with * indicate a potential emergency and should be followed up as soon as possible.  Feel free to call the clinic should you have any questions or concerns. The clinic phone number is [phone].  Please show the [facility] at check-in to the Emergency Department and triage nurse.   Blood Transfusion, Adult, Care After This sheet gives you information about how to care for yourself after your procedure. Your doctor may also give you more specific instructions. If you have problems or questions, contact your doctor. What can I expect after the procedure? After the procedure, it is common to have:  Bruising and soreness at the IV site.  A fever or chills on the day of the procedure. This may be your body's response to the new blood cells received.  A headache. Follow these instructions at home: Insertion site care      Follow instructions from your doctor about how to take care of your insertion site. This is where an IV tube was put into your vein. Make sure you: ? Wash your hands with soap and water before and after you change your bandage (dressing). If you cannot use soap and water, use hand sanitizer. ? Change your bandage as told  by your doctor.  Check your insertion site every day for signs of infection. Check for: ? Redness, swelling, or pain. ? Bleeding from the site. ? Warmth. ? Pus or a bad smell. General instructions  Take over-the-counter and prescription medicines only as told by your doctor.  Rest as told by your doctor.  Go back to your normal activities as told by your doctor.  Keep all follow-up visits as told by your doctor. This is important. Contact a doctor if:  You have itching or red, swollen areas of skin (hives).  You feel worried or nervous (anxious).  You feel weak after doing your normal activities.  You have redness, swelling, warmth, or pain around the insertion site.  You have blood coming from the insertion site, and the blood does not stop with pressure.  You have pus or a bad smell coming from the insertion site. Get help right away if:  You have signs of a serious reaction. This may be coming from an allergy or the body's defense system (immune system). Signs include: ? Trouble breathing or shortness of breath. ? Swelling of the face or feeling warm (flushed). ? Fever or chills. ? Head, chest, or back pain. ? Dark pee (urine) or blood in the pee. ? Widespread rash. ? Fast heartbeat. ? Feeling dizzy or light-headed. You may receive your blood transfusion in an outpatient setting. If so, you will be told whom to contact to report any reactions. These symptoms may be an emergency. Do not wait to see if the  symptoms will go away. Get medical help right away. Call your local emergency services (911 in the [place] Do not drive yourself to the hospital. Summary  Bruising and soreness at the IV site are common.  Check your insertion site every day for signs of infection.  Rest as told by your doctor. Go back to your normal activities as told by your doctor.  Get help right away if you have signs of a serious reaction. This information is not intended to replace advice  given to you by your health care provider. Make sure you discuss any questions you have with your health care provider. Document Revised: 03/22/2019 Document Reviewed: 03/22/2019 Elsevier Patient Education  [employer].

## 2019-11-21 NOTE — Progress Notes (Signed)
Abraxane only today per Lacie, no atezolizumab. She's also getting blood.   Demetrius Charity, PharmD, [facility] Pharmacist Pharmacy Phone: [phone] 11/21/2019

## 2019-11-21 NOTE — Telephone Encounter (Signed)
Scheduled appt per 2/10 los.  Patient will get a print out at their next scheduled appt.

## 2019-11-22 LAB — TYPE AND SCREEN
ABO/RH(D): B POS
Antibody Screen: NEGATIVE
Unit division: 0

## 2019-11-22 LAB — BPAM RBC
Blood Product Expiration Date: 202103152359
ISSUE DATE / TIME: 202102101155
Unit Type and Rh: 7300

## 2019-11-25 ENCOUNTER — Other Ambulatory Visit: Payer: Self-pay | Admitting: Oncology

## 2019-11-28 ENCOUNTER — Ambulatory Visit (HOSPITAL_COMMUNITY)
Admission: RE | Admit: 2019-11-28 | Discharge: 2019-11-28 | Disposition: A | Discharge status: Home / Self Care | Payer: Managed Care, Other (non HMO) | Source: Ambulatory Visit | Attending: Oncology | Admitting: Oncology

## 2019-11-28 ENCOUNTER — Inpatient Hospital Stay (HOSPITAL_BASED_OUTPATIENT_CLINIC_OR_DEPARTMENT_OTHER): Payer: 59 | Admitting: Medical

## 2019-11-28 ENCOUNTER — Other Ambulatory Visit: Payer: Self-pay | Admitting: Medical

## 2019-11-28 ENCOUNTER — Inpatient Hospital Stay: Payer: 59

## 2019-11-28 ENCOUNTER — Other Ambulatory Visit: Payer: Self-pay

## 2019-11-28 ENCOUNTER — Inpatient Hospital Stay (HOSPITAL_BASED_OUTPATIENT_CLINIC_OR_DEPARTMENT_OTHER): Payer: 59 | Admitting: Oncology

## 2019-11-28 ENCOUNTER — Inpatient Hospital Stay (HOSPITAL_COMMUNITY): Admission: RE | Admit: 2019-11-28 | Payer: Managed Care, Other (non HMO) | Source: Ambulatory Visit

## 2019-11-28 ENCOUNTER — Ambulatory Visit (HOSPITAL_COMMUNITY)
Admission: RE | Admit: 2019-11-28 | Discharge: 2019-11-28 | Disposition: A | Discharge status: Home / Self Care | Payer: Managed Care, Other (non HMO) | Source: Ambulatory Visit | Attending: Radiology | Admitting: Radiology

## 2019-11-28 ENCOUNTER — Other Ambulatory Visit: Payer: Self-pay | Admitting: Oncology

## 2019-11-28 VITALS — BP 114/76 | HR 103 | Temp 98.0°F | Resp 20 | Ht 61.0 in | Wt 163.6 lb

## 2019-11-28 VITALS — HR 99

## 2019-11-28 DIAGNOSIS — R0600 Dyspnea, unspecified: Secondary | ICD-10-CM | POA: Insufficient documentation

## 2019-11-28 DIAGNOSIS — Z171 Estrogen receptor negative status [ER-]: Secondary | ICD-10-CM

## 2019-11-28 DIAGNOSIS — J90 Pleural effusion, not elsewhere classified: Secondary | ICD-10-CM | POA: Diagnosis not present

## 2019-11-28 DIAGNOSIS — Z853 Personal history of malignant neoplasm of breast: Secondary | ICD-10-CM | POA: Diagnosis not present

## 2019-11-28 DIAGNOSIS — Z9889 Other specified postprocedural states: Secondary | ICD-10-CM | POA: Insufficient documentation

## 2019-11-28 DIAGNOSIS — C50412 Malignant neoplasm of upper-outer quadrant of left female breast: Secondary | ICD-10-CM | POA: Diagnosis not present

## 2019-11-28 DIAGNOSIS — Z95828 Presence of other vascular implants and grafts: Secondary | ICD-10-CM

## 2019-11-28 DIAGNOSIS — R0609 Other forms of dyspnea: Secondary | ICD-10-CM

## 2019-11-28 LAB — CBC WITH DIFFERENTIAL (CANCER CENTER ONLY)
Abs Immature Granulocytes: 0.21 10*3/uL — ABNORMAL HIGH (ref 0.00–0.07)
Basophils Absolute: 0 10*3/uL (ref 0.0–0.1)
Basophils Relative: 1 %
Eosinophils Absolute: 0.1 10*3/uL (ref 0.0–0.5)
Eosinophils Relative: 2 %
HCT: 29.4 % — ABNORMAL LOW (ref 36.0–46.0)
Hemoglobin: 9.1 g/dL — ABNORMAL LOW (ref 12.0–15.0)
Immature Granulocytes: 5 %
Lymphocytes Relative: 34 %
Lymphs Abs: 1.6 10*3/uL (ref 0.7–4.0)
MCH: 28.1 pg (ref 26.0–34.0)
MCHC: 31 g/dL (ref 30.0–36.0)
MCV: 90.7 fL (ref 80.0–100.0)
Monocytes Absolute: 0.7 10*3/uL (ref 0.1–1.0)
Monocytes Relative: 16 %
Neutro Abs: 1.9 10*3/uL (ref 1.7–7.7)
Neutrophils Relative %: 42 %
Platelet Count: 213 10*3/uL (ref 150–400)
RBC: 3.24 MIL/uL — ABNORMAL LOW (ref 3.87–5.11)
RDW: 16.5 % — ABNORMAL HIGH (ref 11.5–15.5)
WBC Count: 4.6 10*3/uL (ref 4.0–10.5)
nRBC: 17.1 % — ABNORMAL HIGH (ref 0.0–0.2)

## 2019-11-28 LAB — CMP (CANCER CENTER ONLY)
ALT: 69 U/L — ABNORMAL HIGH (ref 0–44)
AST: 40 U/L (ref 15–41)
Albumin: 2.8 g/dL — ABNORMAL LOW (ref 3.5–5.0)
Alkaline Phosphatase: 260 U/L — ABNORMAL HIGH (ref 38–126)
Anion gap: 9 (ref 5–15)
BUN: 8 mg/dL (ref 6–20)
CO2: 26 mmol/L (ref 22–32)
Calcium: 9.2 mg/dL (ref 8.9–10.3)
Chloride: 107 mmol/L (ref 98–111)
Creatinine: 0.68 mg/dL (ref 0.44–1.00)
GFR, Est AFR Am: >60 mL/min (ref >60)
GFR, Estimated: >60 mL/min (ref >60)
Glucose, Bld: 117 mg/dL — ABNORMAL HIGH (ref 70–99)
Potassium: 3.6 mmol/L (ref 3.5–5.1)
Sodium: 142 mmol/L (ref 135–145)
Total Bilirubin: 0.2 mg/dL — ABNORMAL LOW (ref 0.3–1.2)
Total Protein: 6.6 g/dL (ref 6.5–8.1)

## 2019-11-28 LAB — RESP PANEL BY RT PCR (RSV, FLU A&B, COVID)
Influenza A by PCR: NEGATIVE
Influenza B by PCR: NEGATIVE
Respiratory Syncytial Virus by PCR: NEGATIVE
SARS Coronavirus 2 by RT PCR: NEGATIVE

## 2019-11-28 IMAGING — US US THORACENTESIS ASP PLEURAL SPACE W/IMG GUIDE
1 series · 3 of 3 positions shown · non-contrast
Comparison: none

INDICATION: Patient with history of metastatic breast cancer, dyspnea, recurrent
left pleural effusion. Request made for therapeutic left
thoracentesis.

[Series 1: us thoracentesis asp pleural space w/img guide · 3 of 3 slices shown]
[im 1/3]
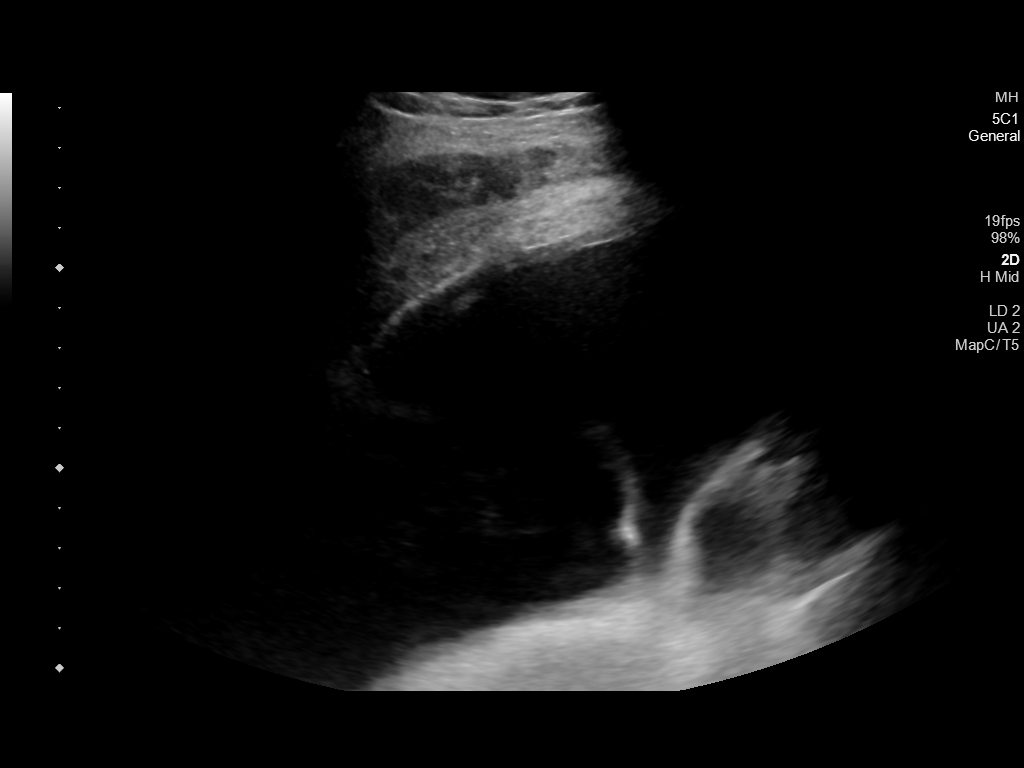
[im 2/3]
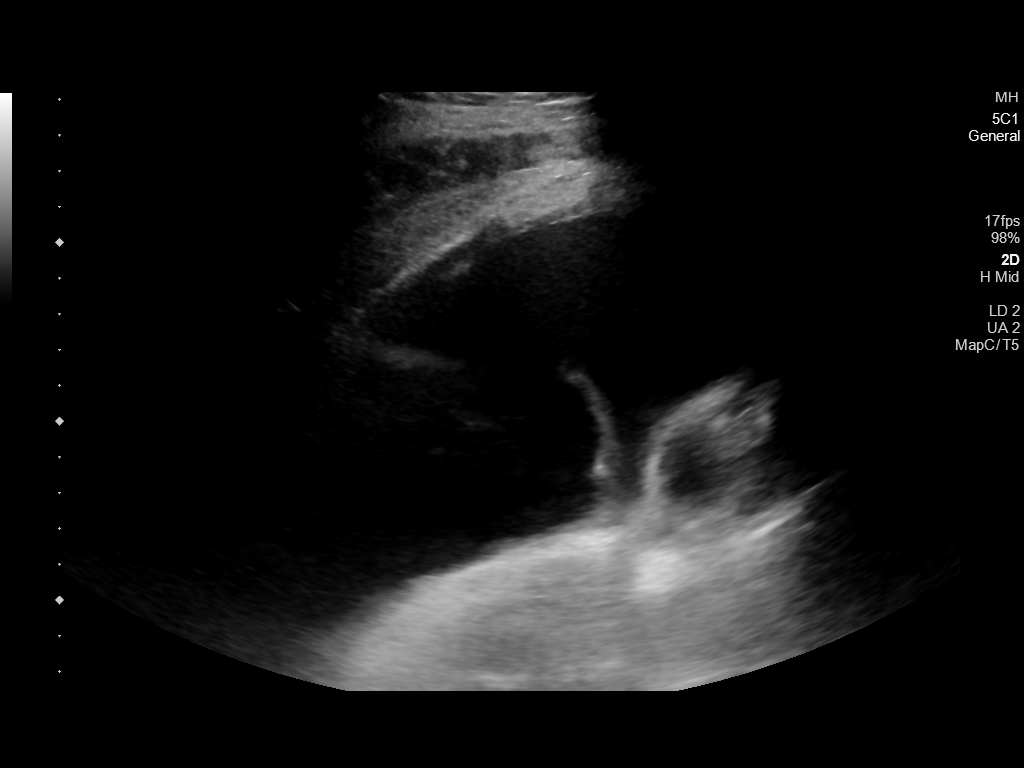
[im 3/3]
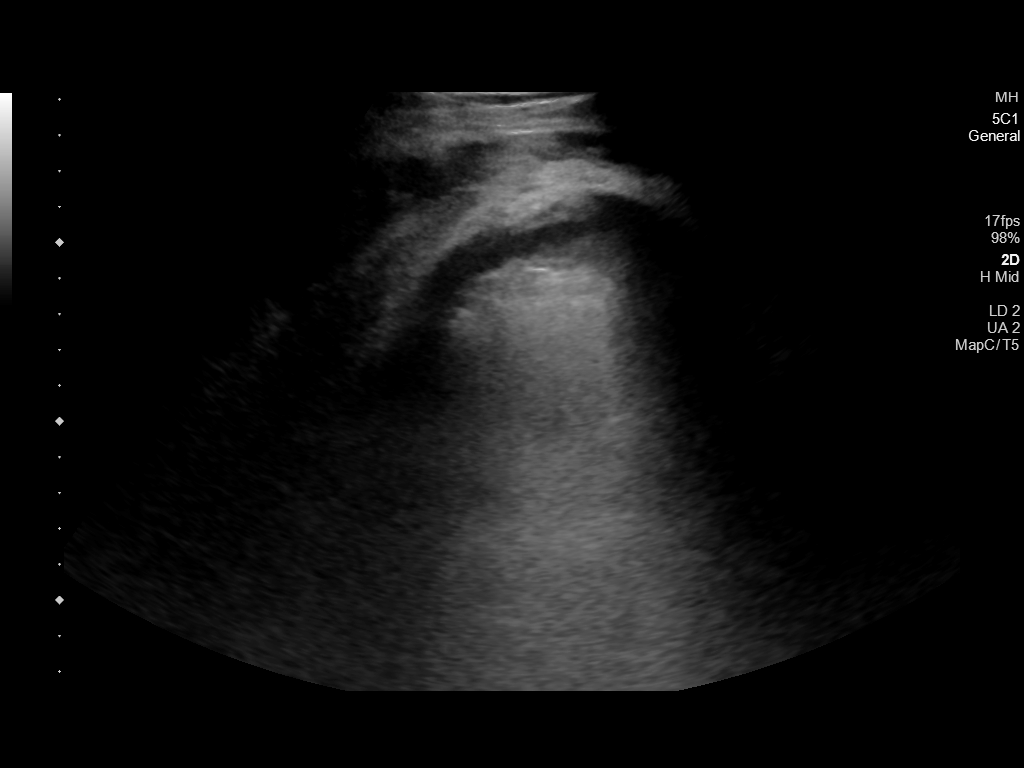

[3 of 3 positions shown; findings below may reference images not displayed]

EXAM:
ULTRASOUND GUIDED THERAPEUTIC LEFT THORACENTESIS

MEDICATIONS:
None

COMPLICATIONS:
None immediate.

PROCEDURE:
An ultrasound guided thoracentesis was thoroughly discussed with the
patient and questions answered. The benefits, risks, alternatives
and complications were also discussed. The patient understands and
wishes to proceed with the procedure. Written consent was obtained.

Ultrasound was performed to localize and mark an adequate pocket of
fluid in the left chest. The area was then prepped and draped in the
normal sterile fashion. 1% Lidocaine was used for local anesthesia.
Under ultrasound guidance a 6 Fr Safe-T-Centesis catheter was
introduced. Thoracentesis was performed. The catheter was removed
and a dressing applied.
FINDINGS: A total of approximately 700 cc of yellow fluid was removed. Due to
patient coughing and chest discomfort only the above amount of fluid
was removed today.
IMPRESSION: Successful ultrasound guided therapeutic left thoracentesis yielding
700 cc of pleural fluid.

## 2019-11-28 IMAGING — DX DG CHEST 1V
1 series · 1 of 1 positions shown · non-contrast
Comparison: PA chest [DATE] and earlier.

CLINICAL DATA: 53-year-old female status post ultrasound-guided
thoracentesis of malignant left pleural effusion. Breast cancer.

EXAM:
CHEST  1 VIEW

[chest pa]
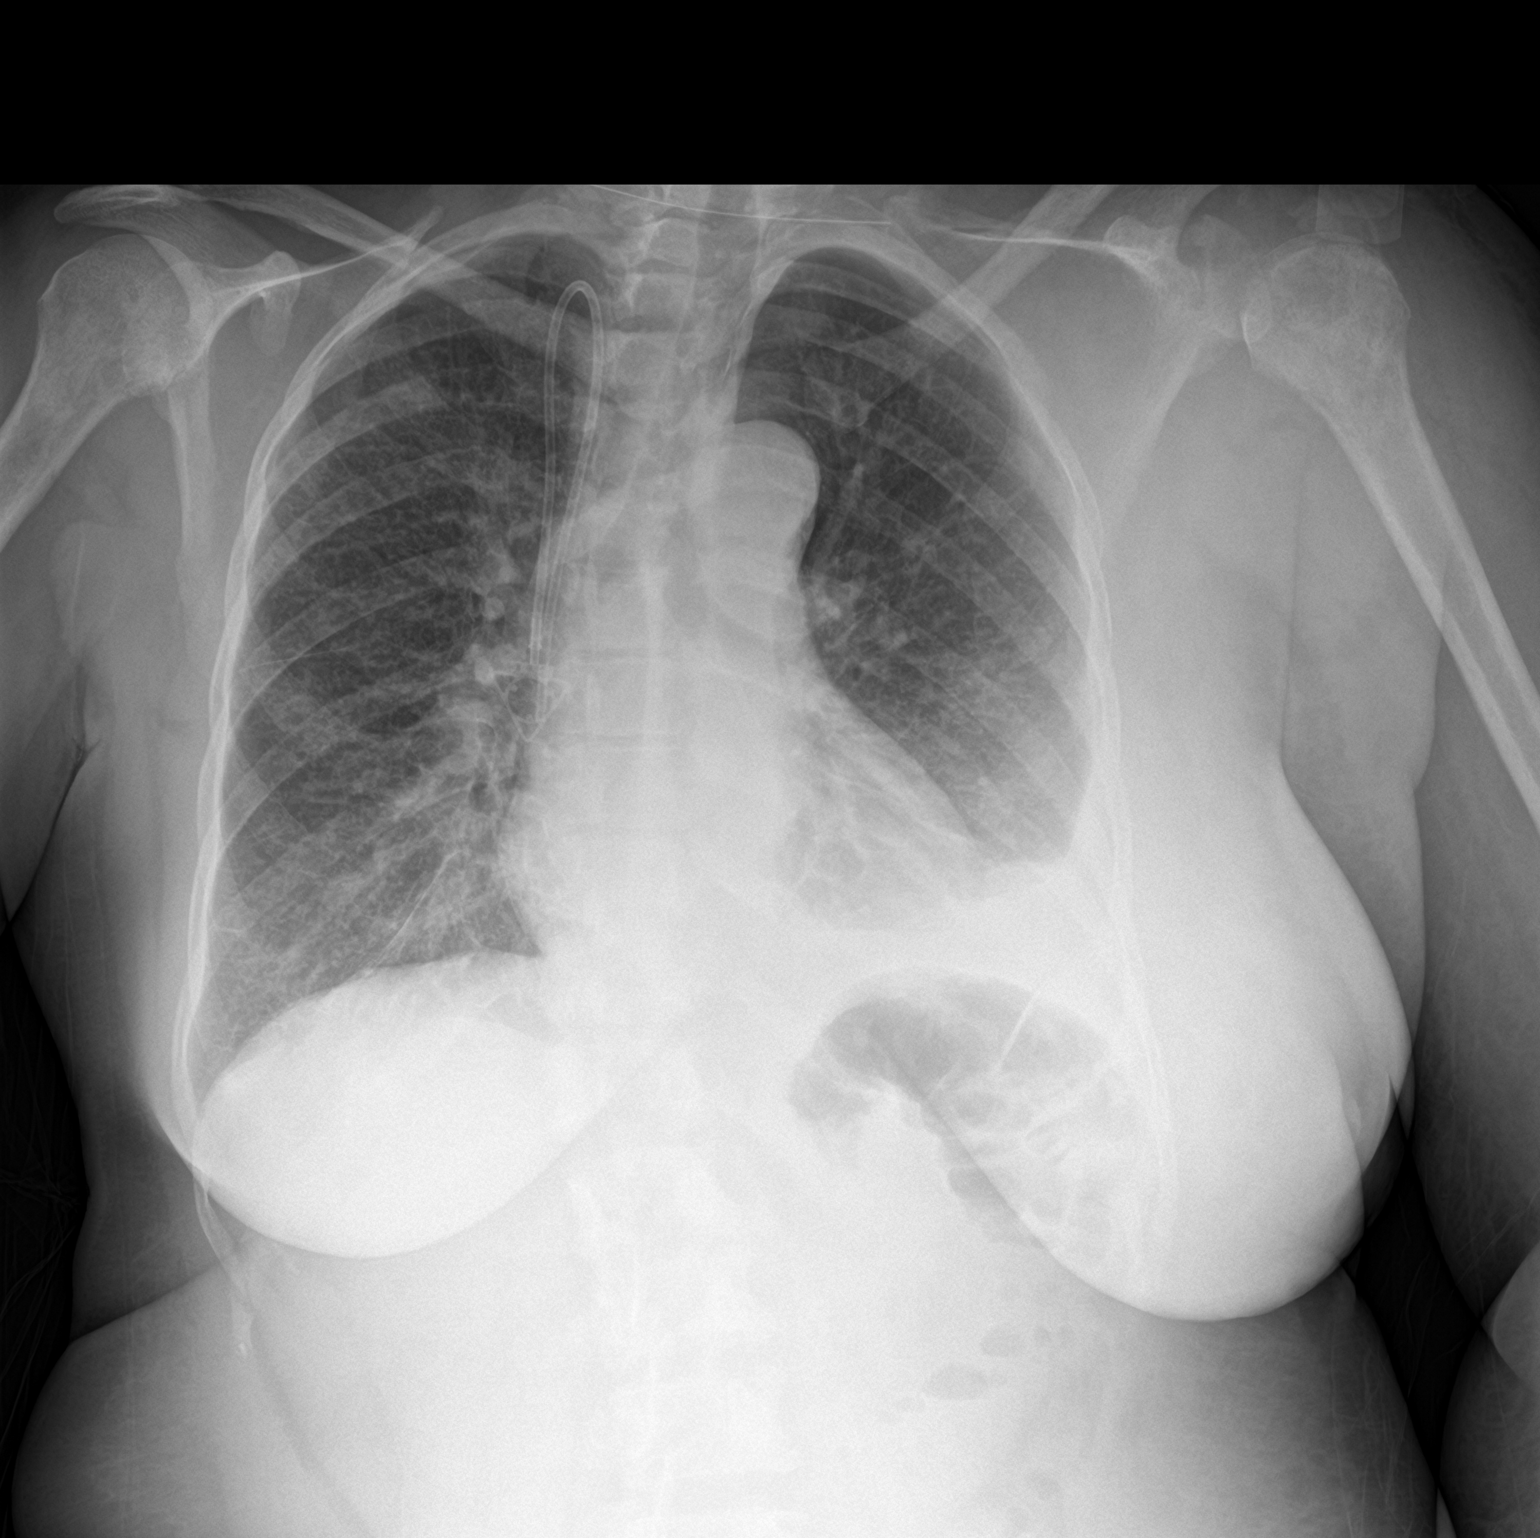

[1 of 1 positions shown; findings below may reference images not displayed]

FINDINGS: PA view at [E4] hours. Small residual left pleural effusion, appears
larger since [REDACTED]. No pneumothorax. Stable mediastinal contours.
Coarse bilateral pulmonary interstitial opacity is stable. Stable
right chest power port. Stable visualized osseous structures.
Negative visible bowel gas pattern.
IMPRESSION: No pneumothorax following left side thoracentesis. Residual small
volume effusion.

## 2019-11-28 MED ORDER — SODIUM CHLORIDE 0.9% FLUSH
10.0000 mL | Freq: Once | INTRAVENOUS | Status: AC
  Administered 2019-11-28: 10 mL
  Filled 2019-11-28: qty 10

## 2019-11-28 MED ORDER — SODIUM CHLORIDE 0.9% FLUSH
10.0000 mL | INTRAVENOUS | Status: DC | PRN
  Administered 2019-11-28: 12:00:00 10 mL
  Filled 2019-11-28: qty 10

## 2019-11-28 MED ORDER — PROCHLORPERAZINE MALEATE 10 MG PO TABS
10.0000 mg | ORAL_TABLET | Freq: Once | ORAL | Status: AC
  Administered 2019-11-28: 10:00:00 10 mg via ORAL

## 2019-11-28 MED ORDER — HEPARIN SOD (PORK) LOCK FLUSH 100 UNIT/ML IV SOLN
500.0000 [IU] | Freq: Once | INTRAVENOUS | Status: AC | PRN
  Administered 2019-11-28: 12:00:00 500 [IU]
  Filled 2019-11-28: qty 5

## 2019-11-28 MED ORDER — PROCHLORPERAZINE MALEATE 10 MG PO TABS
ORAL_TABLET | ORAL | Status: AC
  Filled 2019-11-28: qty 1

## 2019-11-28 MED ORDER — SODIUM CHLORIDE 0.9 % IV SOLN
Freq: Once | INTRAVENOUS | Status: AC
  Filled 2019-11-28: qty 250

## 2019-11-28 MED ORDER — LIDOCAINE HCL 1 % IJ SOLN
INTRAMUSCULAR | Status: AC
  Filled 2019-11-28: qty 20

## 2019-11-28 MED ORDER — PACLITAXEL PROTEIN-BOUND CHEMO INJECTION 100 MG
100.0000 mg/m2 | Freq: Once | INTRAVENOUS | Status: AC
  Administered 2019-11-28: 11:00:00 175 mg via INTRAVENOUS
  Filled 2019-11-28: qty 35

## 2019-11-28 NOTE — Progress Notes (Signed)
The patient was seen for collection of a COVID-19, flu AMB, and RSV swab today.  All of these came back negative.  This was done prior to the patient having a thoracentesis completed today.  Sandi Mealy, MHS, PA-C Physician Assistant

## 2019-11-28 NOTE — Patient Instructions (Signed)
[facility] Discharge Instructions for Patients Receiving Chemotherapy  Today you received the following chemotherapy agents:  Abraxane.  To help prevent nausea and vomiting after your treatment, we encourage you to take your nausea medication as directed.   If you develop nausea and vomiting that is not controlled by your nausea medication, call the clinic.   BELOW ARE SYMPTOMS THAT SHOULD BE REPORTED IMMEDIATELY:  *FEVER GREATER THAN 100.5 F  *CHILLS WITH OR WITHOUT FEVER  NAUSEA AND VOMITING THAT IS NOT CONTROLLED WITH YOUR NAUSEA MEDICATION  *UNUSUAL SHORTNESS OF BREATH  *UNUSUAL BRUISING OR BLEEDING  TENDERNESS IN MOUTH AND THROAT WITH OR WITHOUT PRESENCE OF ULCERS  *URINARY PROBLEMS  *BOWEL PROBLEMS  UNUSUAL RASH Items with * indicate a potential emergency and should be followed up as soon as possible.  Feel free to call the clinic should you have any questions or concerns. The clinic phone number is [phone].  Please show the CHEMO ALERT CARD at check-in to the Emergency Department and triage nurse.   

## 2019-11-28 NOTE — Procedures (Addendum)
Ultrasound-guided therapeutic left thoracentesis performed yielding 700 cc of yellow fluid. No immediate complications. Follow-up chest x-ray pending. EBL < 2 cc. Due to pt coughing/chest discomfort only the above amount of fluid was removed today.

## 2019-11-28 NOTE — Patient Instructions (Signed)

## 2019-11-28 NOTE — Progress Notes (Signed)
[name] OFF[facility] PROGRESS NOTE   Diagnosis: Breast cancer  INTERVAL HISTORY:   A[facility]ail [name] completed another treatment with Abraxane on 11/21/2019.  She reports mild nausea relieved with Compazine.  The left upper arm is less swollen, but the hand remains swollen.  She has increased exertional dyspnea.  The symptoms.  Objective:  Vital signs in last 24 hours:  Blood pressure 114/76, pulse (!) 103, temperature 98 F (36.7 C), temperature source Temporal, resp. rate 20, height '5\' 1"'  (1.549 m), weight 163 lb 9.6 oz (74.2 kg), SpO2 100 %.   Resp: Decreased breath sounds with inspiratory rales at the left lower posterior chest, dullness at the left lower chest Cardio: Regular rate and rhythm GI: No hepatomegaly Vascular: No leg edema, edema throughout the left arm and hand Breast: Edematous firm left breast.  Skin thickening with nodularity extending into the left axilla left upper back.  Nodular lesions at the lateral aspect of the pectoral muscle  Portacath/PICC-without erythema  Lab Results:  Lab Results  Component Value Date   WBC 4.6 11/28/2019   HGB 9.1 (L) 11/28/2019   HCT 29.4 (L) 11/28/2019   MCV 90.7 11/28/2019   PLT 213 11/28/2019   NEUTROABS 1.9 11/28/2019    CMP  Lab Results  Component Value Date   NA 142 11/28/2019   K 3.6 11/28/2019   CL 107 11/28/2019   CO2 26 11/28/2019   GLUCOSE 117 (H) 11/28/2019   BUN 8 11/28/2019   CREATININE 0.68 11/28/2019   CALCIUM 9.2 11/28/2019   PROT 6.6 11/28/2019   ALBUMIN 2.8 (L) 11/28/2019   AST 40 11/28/2019   ALT 69 (H) 11/28/2019   ALKPHOS 260 (H) 11/28/2019   BILITOT 0.2 (L) 11/28/2019   GFRNONAA >60 11/28/2019   GFRAA >60 11/28/2019    1.Metastatic breast cancer  CT chest 10/25/2019-left breast mass left axillary lymph nodes, abnormal appearance of the left concerning for tumor involvement, mediastinal adenopathy, diffuse sclerotic and lytic lesions, left pleural effusion  CT abdomen/pelvis  10/26/2019 enhancing left breast mass with left axillary lymphadenopathy and tumor involved the left chest wall the latissimus dorsi segment 4B liver lesion, diffuse lytic/sclerotic lesions  Ultrasound-guided biopsy of left liver lesion 10/29/1999-metastatic carcinoma, cytokeratin andGATA3positive consistent with metastatic breast cancer. HER-2 negative, ER negative, PR negative, Ki-6720%. PD-L1 <1% expression   [employer] hereditary panel negative  Initiated systemic chemotherapy weekly abraxane 100 mg/m2 days 1, 8, 15 q28 days   Cycle 1 day 1 on 11/14/19  Cycle 1 day 8 on 11/21/19 (hg 7.5, receive 1 unit RBCs)  Cycle 1 day 15 on 11/28/2019 2. Left upper extremity DVT-left subclavian, axillary, and brachial vein thrombosis confirmed on  Anticoagulation with apixaban, changed to parenteral hospital admission1/14/2021transition to twice daily Lovenox at discharge; transition to once daily 11/08/2019. 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   2D echo 11/08/2019-ejection fraction 55 to 60%, no pericardial effusion 7.   Tachycardia-likely multifactorial secondary to cancer burden, anemia, pleural effusion-improved  Medications: I have reviewed the patient's current medications.    Disposition: A[facility]ail [name] has completed 2 treatments with Abraxane.  She has tolerated the chemotherapy well.  Her overall status is unchanged.  The left breast appears slightly less edematous today.  She continues to have exertional dyspnea, likely related to the left pleural effusion.  She will be referred for another therapeutic thoracentesis today.  We will refer her  for placement of a Pleurx if she continues to require frequent thoracentesis procedures.  I encouraged her to elevate the left arm.  She will complete  day 15 chemotherapy today.  Abigail [name] will return for an office visit and chemotherapy in 2 weeks.  Genetic testing returned negative.  Betsy Coder, MD  11/28/2019  1:15 PM

## 2019-11-29 ENCOUNTER — Telehealth: Payer: Self-pay | Admitting: *Deleted

## 2019-11-29 NOTE — Telephone Encounter (Signed)
Informed patient that Dr. Benay Spice saw the procedure report from her thoracentesis yesterday. Needing to stop procedure due to chest discomfort or coughing is not uncommon. Pleura gets irritated. She was thankful for the explaination. Also reports her employer is faxing out of work forms for her. Gave her direct fax # in case forms are not received in a timely manner via HIM fax #.

## 2019-12-04 ENCOUNTER — Other Ambulatory Visit: Payer: Self-pay | Admitting: Nurse Practitioner

## 2019-12-04 DIAGNOSIS — N6320 Unspecified lump in the left breast, unspecified quadrant: Secondary | ICD-10-CM

## 2019-12-04 MED ORDER — HYDROCODONE-ACETAMINOPHEN 5-325 MG PO TABS: 1.0000 | ORAL_TABLET | Freq: Three times a day (TID) | ORAL | 0 refills | Status: DC | PRN

## 2019-12-07 ENCOUNTER — Telehealth: Payer: Self-pay | Admitting: *Deleted

## 2019-12-07 NOTE — Telephone Encounter (Signed)
Called to report a dry cough and asking what she can take. Instructed her to take OTC cough suppressant such as Delsym, Robitussin or Mucinex.

## 2019-12-08 ENCOUNTER — Other Ambulatory Visit: Payer: Self-pay | Admitting: Oncology

## 2019-12-11 ENCOUNTER — Ambulatory Visit (HOSPITAL_COMMUNITY)
Admission: RE | Admit: 2019-12-11 | Discharge: 2019-12-11 | Disposition: A | Discharge status: Home / Self Care | Payer: 59 | Source: Ambulatory Visit | Attending: Nurse Practitioner | Admitting: Nurse Practitioner

## 2019-12-11 ENCOUNTER — Inpatient Hospital Stay: Payer: 59 | Admitting: Medical

## 2019-12-11 ENCOUNTER — Encounter: Payer: Self-pay | Admitting: Nurse Practitioner

## 2019-12-11 ENCOUNTER — Other Ambulatory Visit: Payer: Self-pay

## 2019-12-11 ENCOUNTER — Other Ambulatory Visit: Payer: Self-pay | Admitting: Medical

## 2019-12-11 ENCOUNTER — Ambulatory Visit (HOSPITAL_COMMUNITY)
Admission: RE | Admit: 2019-12-11 | Discharge: 2019-12-11 | Disposition: A | Discharge status: Home / Self Care | Payer: 59 | Source: Ambulatory Visit | Attending: Physician Assistant | Admitting: Physician Assistant

## 2019-12-11 ENCOUNTER — Other Ambulatory Visit: Payer: Self-pay | Admitting: Nurse Practitioner

## 2019-12-11 ENCOUNTER — Telehealth: Payer: Self-pay | Admitting: *Deleted

## 2019-12-11 ENCOUNTER — Ambulatory Visit (HOSPITAL_COMMUNITY): Payer: 59

## 2019-12-11 ENCOUNTER — Inpatient Hospital Stay: Payer: 59 | Attending: Oncology | Admitting: Nurse Practitioner

## 2019-12-11 VITALS — BP 112/68 | HR 108 | Temp 98.5°F | Resp 18 | Wt 169.0 lb

## 2019-12-11 DIAGNOSIS — R05 Cough: Secondary | ICD-10-CM | POA: Insufficient documentation

## 2019-12-11 DIAGNOSIS — R0602 Shortness of breath: Secondary | ICD-10-CM

## 2019-12-11 DIAGNOSIS — C50912 Malignant neoplasm of unspecified site of left female breast: Secondary | ICD-10-CM | POA: Insufficient documentation

## 2019-12-11 DIAGNOSIS — Z171 Estrogen receptor negative status [ER-]: Secondary | ICD-10-CM

## 2019-12-11 DIAGNOSIS — J90 Pleural effusion, not elsewhere classified: Secondary | ICD-10-CM

## 2019-12-11 DIAGNOSIS — Z20822 Contact with and (suspected) exposure to covid-19: Secondary | ICD-10-CM | POA: Diagnosis not present

## 2019-12-11 DIAGNOSIS — R110 Nausea: Secondary | ICD-10-CM | POA: Insufficient documentation

## 2019-12-11 DIAGNOSIS — D701 Agranulocytosis secondary to cancer chemotherapy: Secondary | ICD-10-CM | POA: Insufficient documentation

## 2019-12-11 DIAGNOSIS — D649 Anemia, unspecified: Secondary | ICD-10-CM | POA: Insufficient documentation

## 2019-12-11 DIAGNOSIS — C801 Malignant (primary) neoplasm, unspecified: Secondary | ICD-10-CM | POA: Diagnosis not present

## 2019-12-11 DIAGNOSIS — R918 Other nonspecific abnormal finding of lung field: Secondary | ICD-10-CM | POA: Diagnosis not present

## 2019-12-11 DIAGNOSIS — C7951 Secondary malignant neoplasm of bone: Secondary | ICD-10-CM | POA: Insufficient documentation

## 2019-12-11 DIAGNOSIS — Z5189 Encounter for other specified aftercare: Secondary | ICD-10-CM | POA: Insufficient documentation

## 2019-12-11 DIAGNOSIS — Z5111 Encounter for antineoplastic chemotherapy: Secondary | ICD-10-CM | POA: Insufficient documentation

## 2019-12-11 DIAGNOSIS — C7952 Secondary malignant neoplasm of bone marrow: Secondary | ICD-10-CM | POA: Insufficient documentation

## 2019-12-11 DIAGNOSIS — R0902 Hypoxemia: Secondary | ICD-10-CM | POA: Insufficient documentation

## 2019-12-11 DIAGNOSIS — C50412 Malignant neoplasm of upper-outer quadrant of left female breast: Secondary | ICD-10-CM

## 2019-12-11 LAB — RESP PANEL BY RT PCR (RSV, FLU A&B, COVID)
Influenza A by PCR: NEGATIVE
Influenza B by PCR: NEGATIVE
Respiratory Syncytial Virus by PCR: NEGATIVE
SARS Coronavirus 2 by RT PCR: NEGATIVE

## 2019-12-11 IMAGING — DX DG CHEST 1V
1 series · 1 of 1 positions shown · non-contrast
Comparison: Chest radiographs [DATE] and earlier.

CLINICAL DATA: 53-year-old female status post ultrasound-guided
left side thoracentesis this afternoon. Metastatic breast cancer.

EXAM:
CHEST  1 VIEW

[chest pa]
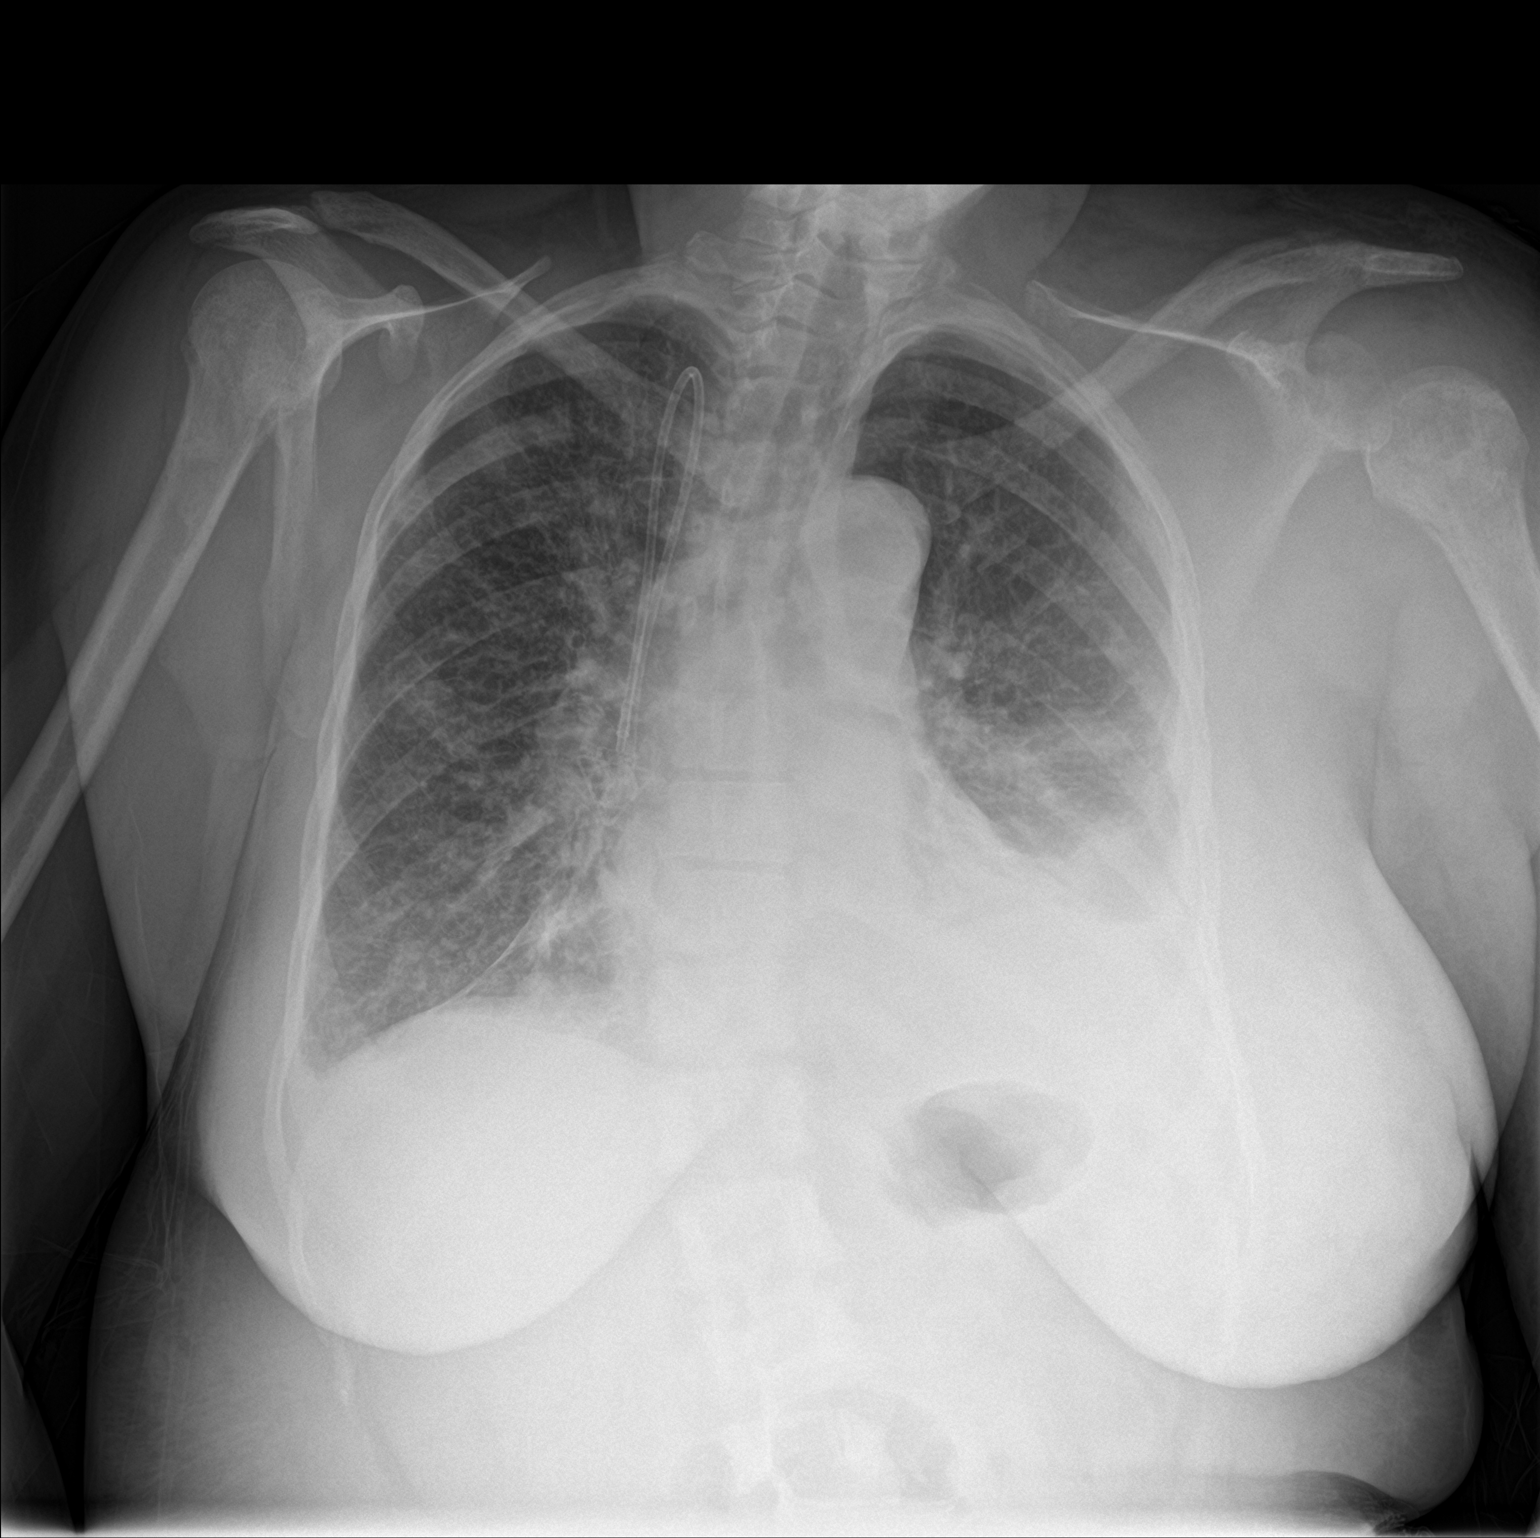

[1 of 1 positions shown; findings below may reference images not displayed]

FINDINGS: PA view at [6V] hours. No pneumothorax. Residual left side pleural
effusion similar to that last month. Small or trace right pleural
effusion also now suspected. Stable right chest power port. Stable
cardiac size and mediastinal contours. Stable nonspecific bilateral
pulmonary reticulonodular opacity.

Abnormal bone mineralization. The left glenohumeral joint has become
dislocated since [REDACTED]. Negative visible bowel gas pattern.
IMPRESSION: 1. No pneumothorax following Left side thoracentesis. Small left
greater than right pleural effusions.
2. Osseous metastatic disease with dislocated left glenohumeral
joint since [DATE]. Stable nonspecific pulmonary interstitial opacity.

## 2019-12-11 IMAGING — US US THORACENTESIS ASP PLEURAL SPACE W/IMG GUIDE
1 series · 6 of 6 positions shown · non-contrast
Comparison: none

INDICATION: Patient with history of metastatic breast cancer, recurrent
symptomatic left malignant pleural effusion. Request to IR for
therapeutic left thoracentesis.

[Series 1: us thoracentesis asp pleural space w/img guide · 6 of 6 slices shown]
[im 1/6]
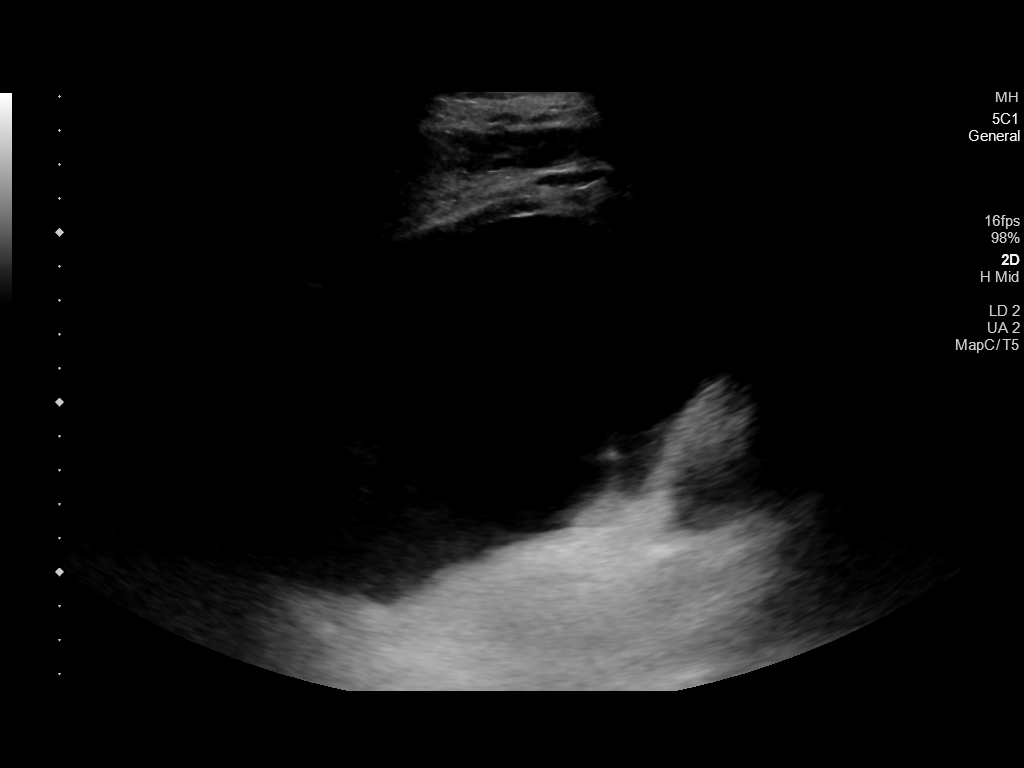
[im 2/6]
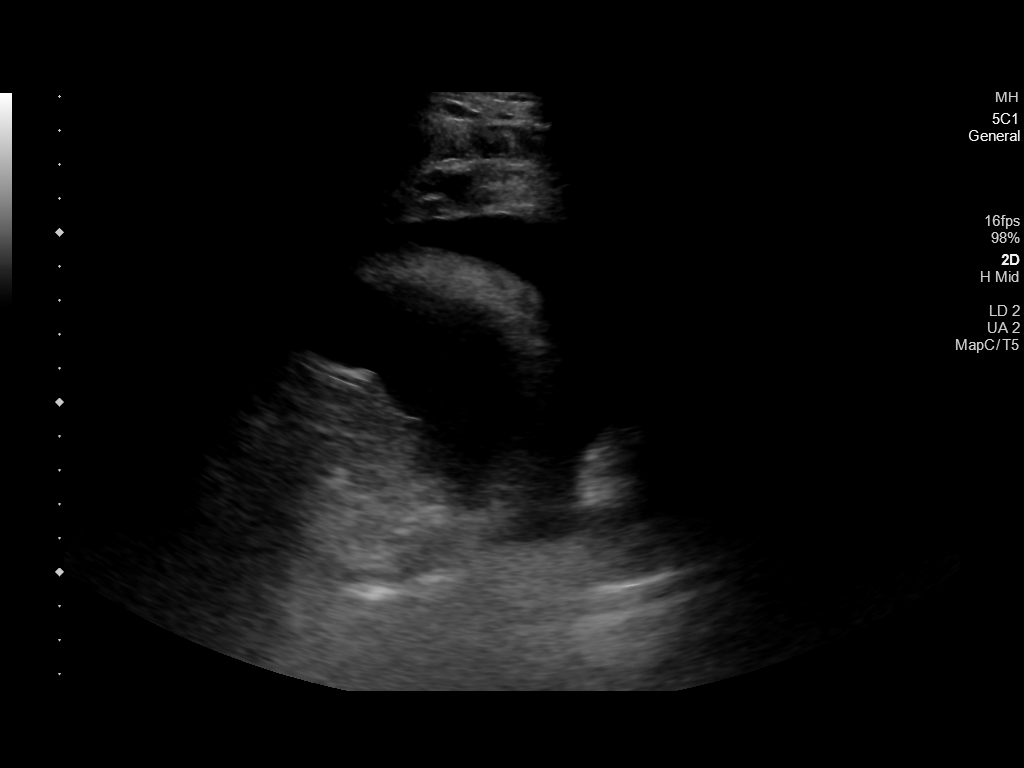
[im 3/6]
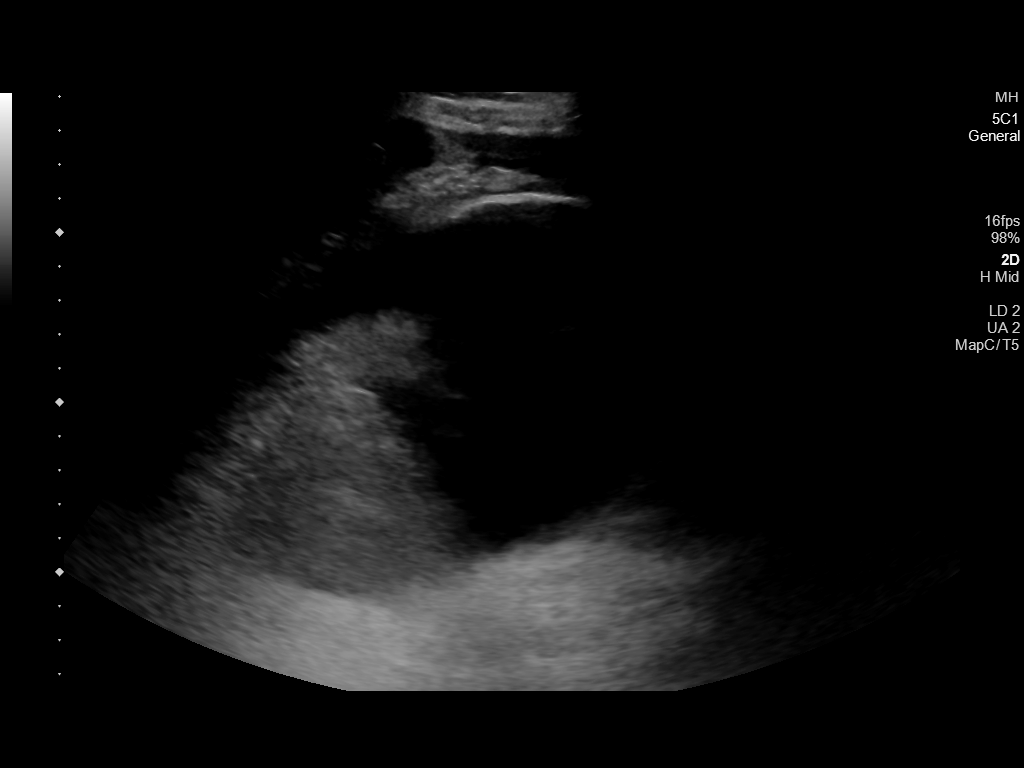
[im 4/6]
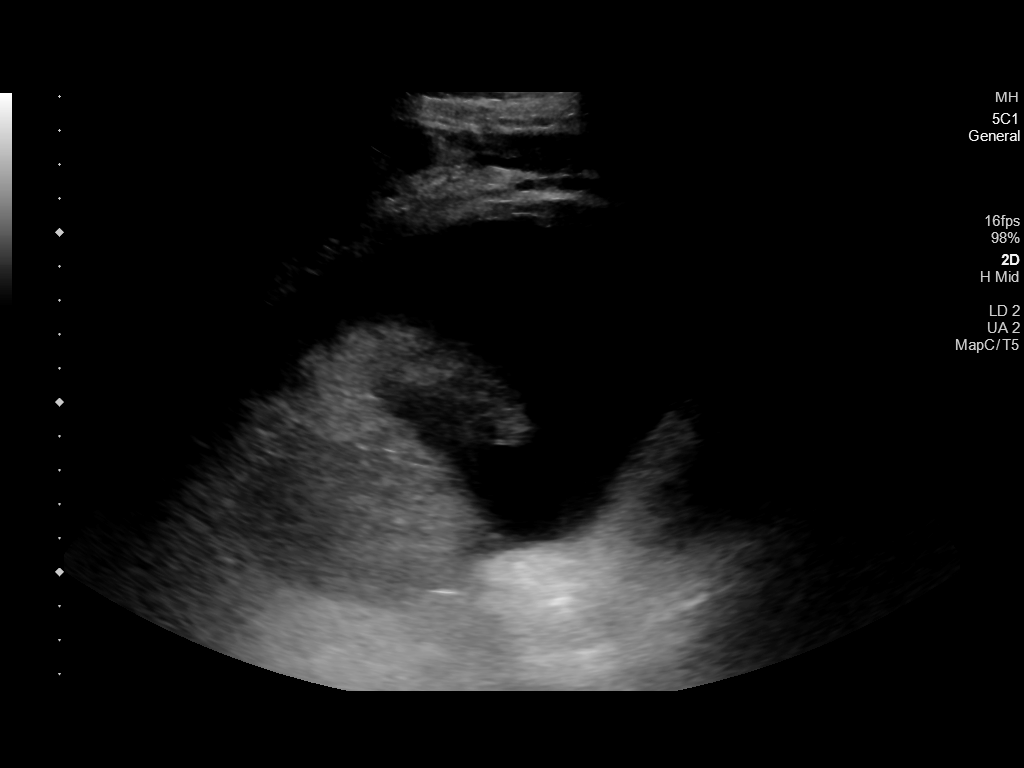
[im 5/6]
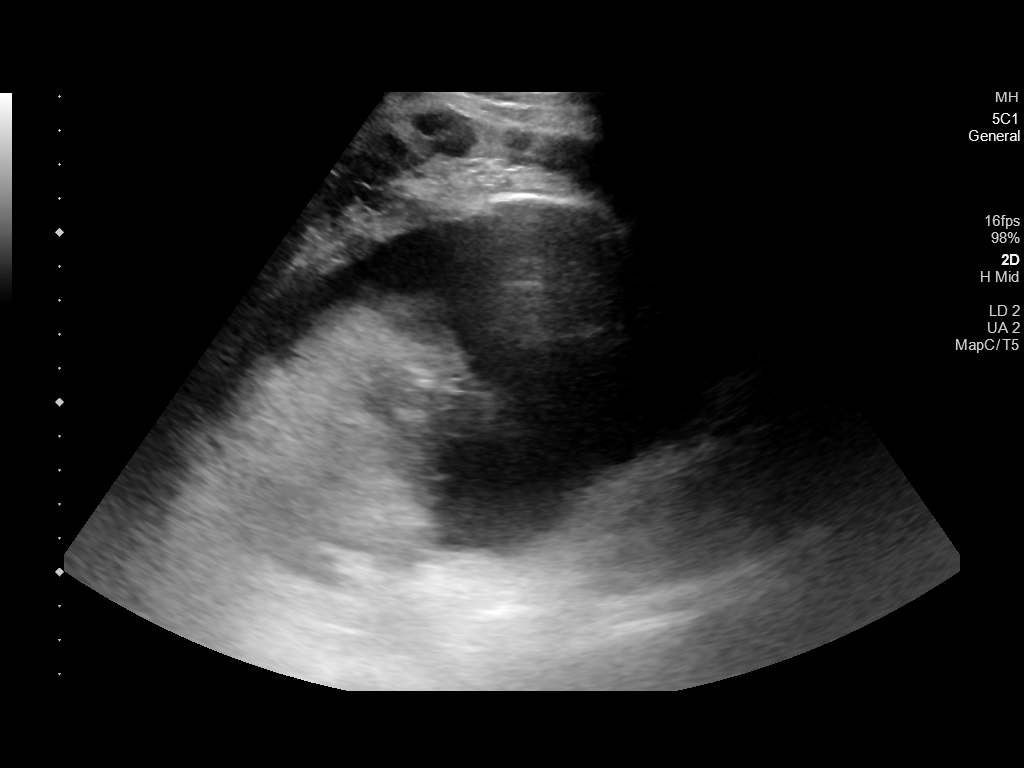
[im 6/6]
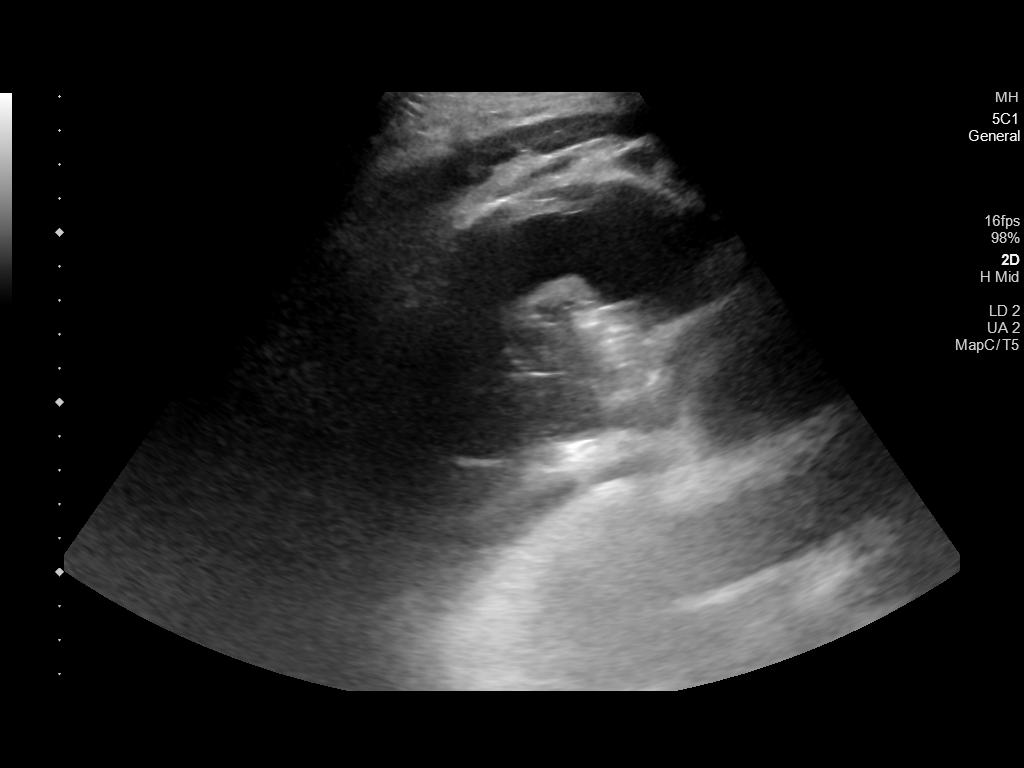

[6 of 6 positions shown; findings below may reference images not displayed]

EXAM:
ULTRASOUND GUIDED LEFT THORACENTESIS

MEDICATIONS:
10 mL 1% lidocaine

COMPLICATIONS:
None immediate.

PROCEDURE:
An ultrasound guided thoracentesis was thoroughly discussed with the
patient and questions answered. The benefits, risks, alternatives
and complications were also discussed. The patient understands and
wishes to proceed with the procedure. Written consent was obtained.

Ultrasound was performed to localize and mark an adequate pocket of
fluid in the left chest. The area was then prepped and draped in the
normal sterile fashion. 1% Lidocaine was used for local anesthesia.
Under ultrasound guidance a 6 Fr Safe-T-Centesis catheter was
introduced. Thoracentesis was performed. The catheter was removed
and a dressing applied.
FINDINGS: A total of approximately 750 mL of clear yellow fluid was removed.
IMPRESSION: Successful ultrasound guided left thoracentesis yielding 750 mL of
pleural fluid.

Read by LITZY

## 2019-12-11 MED ORDER — OXYCODONE-ACETAMINOPHEN 5-325 MG PO TABS
ORAL_TABLET | ORAL | Status: AC
  Filled 2019-12-11: qty 1

## 2019-12-11 MED ORDER — LIDOCAINE HCL 1 % IJ SOLN
INTRAMUSCULAR | Status: AC
  Filled 2019-12-11: qty 20

## 2019-12-11 MED ORDER — LORAZEPAM 1 MG PO TABS
ORAL_TABLET | ORAL | Status: AC
  Filled 2019-12-11: qty 1

## 2019-12-11 MED ORDER — LORAZEPAM 1 MG PO TABS
0.5000 mg | ORAL_TABLET | Freq: Once | ORAL | Status: AC
  Administered 2019-12-11: 0.5 mg via ORAL

## 2019-12-11 MED ORDER — OXYCODONE-ACETAMINOPHEN 5-325 MG PO TABS
0.5000 | ORAL_TABLET | Freq: Once | ORAL | Status: AC
  Administered 2019-12-11: 0.5 via ORAL

## 2019-12-11 NOTE — Progress Notes (Addendum)
[place]   Telephone:([phone] Fax:(336) [phone]   Clinic Follow up Note   Patient Care Team: Hoyt Koch, MD as PCP - General (Internal Medicine) 12/11/2019  CHIEF COMPLAINT: increased dyspnea   INTERVAL HISTORY: [name] presents for symptom management visit. She completed cycle 1 day 15 Abraxane on 11/28/19, COVID19, flu, and RSV were negative that day. She underwent US thoracentesis with 700 cc of yellow fluid aspirated. Pleural fluid showed malignant cells on 11/09/19.   Today, she is reporting worsening dyspnea on minimal exertion. This has worsened gradually since last week. She called 2/26 reporting a dry cough, was told to try OTC cough suppressant. Symptoms did not improve over the weekend. She has severe dyspnea, can't walk more than a few steps without getting out of breath. She checks O2 at home, resting 97-98%. Her cough is productive with yellow sputum. Denies fever, chills, chest pain. She has never required supplemental oxygen.   Otherwise, she did well with chemo, mild nausea without vomiting. No constipation or diarrhea. She remained able to eat and drink. Energy level improved overall. She thinks the chemo is helping.    MEDICAL HISTORY:  Past Medical History:  Diagnosis Date  . Allergic rhinitis   . Hyperlipidemia    LDL 150's '11    SURGICAL HISTORY: Past Surgical History:  Procedure Laterality Date  . CESAREAN SECTION    . IR IMAGING GUIDED PORT INSERTION  11/07/2019  . IR THORACENTESIS ASP PLEURAL SPACE W/IMG GUIDE  11/09/2019  . IR US GUIDE BX ASP/DRAIN  10/29/2019    I have reviewed the social history and family history with the patient and they are unchanged from previous note.  ALLERGIES:  is allergic to shellfish allergy and sulfamethoxazole.  MEDICATIONS:  Current Outpatient Medications  Medication Sig Dispense Refill  . diclofenac (VOLTAREN) 75 MG EC tablet Take 75 mg by mouth 2 (two) times daily.    Marland Kitchen enoxaparin  (LOVENOX) 120 MG/0.8ML injection Inject 0.73 mLs (110 mg total) into the skin daily. 24 mL 2  . HYDROcodone-acetaminophen (NORCO/VICODIN) 5-325 MG tablet Take 1 tablet by mouth every 8 (eight) hours as needed for moderate pain or severe pain. 50 tablet 0  . lidocaine-prilocaine (EMLA) cream Apply 1 application topically as directed. Apply to port 1 hour prior to stick and cover with plastic wrap 30 g 5  . methocarbamol (ROBAXIN) 500 MG tablet Take 500 mg by mouth 4 (four) times daily.    . ondansetron (ZOFRAN) 8 MG tablet Take 1 tablet (8 mg total) by mouth every 8 (eight) hours as needed for nausea or vomiting. 30 tablet 1  . prochlorperazine (COMPAZINE) 10 MG tablet Take 1 tablet (10 mg total) by mouth every 6 (six) hours as needed for nausea. 60 tablet 1   No current facility-administered medications for this visit.   Facility-Administered Medications Ordered in Other Visits  Medication Dose Route Frequency Provider Last Rate Last Admin  . lidocaine (XYLOCAINE) 1 % (with pres) injection             PHYSICAL EXAMINATION:  Vitals:   12/11/19 1110 12/11/19 1200  BP: 121/75   Pulse: (!) 116 (!) 127  Resp: 20   Temp: 98.5 F (36.9 C)   SpO2: 95% (!) 86%   Filed Weights   12/11/19 1110  Weight: 169 lb (76.7 kg)    GENERAL:alert, no distress and comfortable EYES: sclera clear SKIN: subcutaneous nodules noted to breast, axilla, and left back with skin thickening  LUNGS: absent breast sounds in left lower lobe. Mildly increased work of breathing during conversation  HEART: tachycardic, regular rhythm.  MSK: left extremity edema is stable. Trace lower leg edema bilaterally  NEURO: alert & oriented x 3 with fluent speech Breast: left breast softer with persistent edema   PAC without erythema   LABORATORY DATA:  I have reviewed the data as listed CBC Latest Ref Rng & Units 11/28/2019 11/21/2019 11/14/2019  WBC 4.0 - 10.5 K/uL 4.6 5.3 8.8  Hemoglobin 12.0 - 15.0 g/dL 9.1(L) 7.5(L)  9.3(L)  Hematocrit 36.0 - 46.0 % 29.4(L) 24.9(L) 31.2(L)  Platelets 150 - 400 K/uL 213 206 187     CMP Latest Ref Rng & Units 11/28/2019 11/21/2019 11/14/2019  Glucose 70 - 99 mg/dL 117(H) 105(H) 120(H)  BUN 6 - 20 mg/dL '8 14 18  ' Creatinine 0.44 - 1.00 mg/dL 0.68 0.69 0.78  Sodium 135 - 145 mmol/L 142 140 139  Potassium 3.5 - 5.1 mmol/L 3.6 3.9 3.7  Chloride 98 - 111 mmol/L 107 102 99  CO2 22 - 32 mmol/L '26 28 28  ' Calcium 8.9 - 10.3 mg/dL 9.2 9.4 9.6  Total Protein 6.5 - 8.1 g/dL 6.6 6.8 7.2  Total Bilirubin 0.3 - 1.2 mg/dL 0.2(L) 0.3 0.4  Alkaline Phos 38 - 126 U/L 260(H) 195(H) 235(H)  AST 15 - 41 U/L 40 61(H) 37  ALT 0 - 44 U/L 69(H) 75(H) 54(H)      RADIOGRAPHIC STUDIES: I have personally reviewed the radiological images as listed and agreed with the findings in the report. No results found.   ASSESSMENT & PLAN:   1.Metastatic breast cancer  CT chest 10/25/2019-left breast mass left axillary lymph nodes, abnormal appearance of the left concerning for tumor involvement, mediastinal adenopathy, diffuse sclerotic and lytic lesions, left pleural effusion  CT abdomen/pelvis 10/26/2019 enhancing left breast mass with left axillary lymphadenopathy and tumor involved the left chest wall the latissimus dorsi segment 4B liver lesion, diffuse lytic/sclerotic lesions  Ultrasound-guided biopsy of left liver lesion 10/29/1999-metastatic carcinoma, cytokeratin andGATA3positive consistent with metastatic breast cancer. HER-2 negative, ER negative, PR negative, Ki-6720%. PD-L1 <1% expression   Initiated systemic chemotherapy weekly abraxane 100 mg/m2 days 1, 8, 15 q28 days   Cycle 1 day 1 on 11/14/19  Cycle 1 day 8 on 11/21/19 (hg 7.5, receive 1 unit RBCs)  Cycle 1 day 15 12/08/2019 2. Left upper extremity DVT-left subclavian, axillary, and brachial vein thrombosis confirmed on  Anticoagulation with apixaban, changed to parenteral hospital admission1/14/2021transition to twice daily  Lovenox at discharge; transition to once daily 11/08/2019. 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 She has progressive exertional dyspnea and hypoxia. Physical exam is concerning for recurrent pleural effusion. We are arranging chest xray and therapeutic thoracentesis today. COVID19/flu/RSV panel is negative today. She will receive 0.5 mg po ativan x1 in clinic due to her anxiety with the procedure. She will return to [place] after to re-evaluate her respiratory status after thoracentesis.   She will be referred to IR to discuss pleurX catheter placement.   From a breast cancer standpoint, her left breast appears mildly softer, but the subcutaneous nodularity appears unchanged. She will likely need more intensive chemotherapy. She will return as  previously scheduled for office visit and treatment tomorrow 12/12/19.   The patient was seen with Dr. Benay Spice today.   No problem-specific Assessment & Plan notes found for this encounter.   Orders Placed This Encounter  Procedures  . US Thoracentesis Asp Pleural space w/IMG guide    Standing Status:   Future    Number of Occurrences:   1    Standing Expiration Date:   12/10/2020    Order Specific Question:   Are labs required for specimen collection?    Answer:   No    Order Specific Question:   Reason for Exam (SYMPTOM  OR DIAGNOSIS REQUIRED)    Answer:   malignant effusion, dyspnea    Order Specific Question:   Preferred imaging location?    Answer:   [facility]  . IR [facility] PLEURAL DRAIN  W/[facility] CATH W/IMG GUIDE    Standing Status:   Future    Standing Expiration Date:   02/09/2021    Order Specific Question:   Reason for Exam (SYMPTOM  OR DIAGNOSIS REQUIRED)    Answer:   recurrent left malignant effusion, metastatic breast cancer    Order Specific Question:   Is the patient pregnant?    Answer:   No    Order Specific Question:   Preferred Imaging Location?    Answer:   [facility]   All questions were answered. The patient knows to call the clinic with any problems, questions or concerns. No barriers to learning were detected.     Alla Feeling, NP 12/11/19  This was a shared visit with Cira Rue.  Ms. Boulais was interviewed and examined.  She has completed 3 treatments with Abraxane.  Her clinical status has not improved and there is persistent fullness of the left breast, skin thickening over the left chest wall, and left chest/upper back skin nodules.  There is a persistent left pleural effusion.  I recommend changing systemic therapy.  I discussed the case with several colleagues.  The plan is to begin [facility] chemotherapy. She felt better after the thoracentesis today.  She will return for an office visit and chemotherapy tomorrow.  [name], MD

## 2019-12-11 NOTE — Progress Notes (Signed)
This patient was seen for COVID-19 screening prior to a thoracentesis today.  A specimen was collected and was sent to the lab for COVID-19, influenza A and B, and RSV which were all returned negative.  Sandi Mealy, MHS, PA-C Physician Assistant

## 2019-12-11 NOTE — Telephone Encounter (Signed)
Reports significant increase in shortness of breath over the weekend. Takes few steps and needs to sit down to recover (takes ~ 3 minutes to recover). Also feels short of breath w/conversation. Denies fever. Some cough w/clear to yellow sputum production. Per Dr. Benay Spice: CXR today and come to office at 11:30.

## 2019-12-11 NOTE — Procedures (Signed)
PROCEDURE SUMMARY:  Successful image-guided left sided thoracentesis. Yielded 750 milliliters of clear yellow fluid. Patient tolerated procedure well. EBL: Zero No immediate complications.  Specimen was not sent for labs. Post procedure CXR shows no pneumothorax.  Please see imaging section of Epic for full dictation.  Joaquim Nam PA-C 12/11/2019 3:08 PM

## 2019-12-12 ENCOUNTER — Inpatient Hospital Stay (HOSPITAL_BASED_OUTPATIENT_CLINIC_OR_DEPARTMENT_OTHER): Payer: 59 | Admitting: Oncology

## 2019-12-12 ENCOUNTER — Inpatient Hospital Stay: Payer: 59

## 2019-12-12 ENCOUNTER — Other Ambulatory Visit: Payer: Self-pay

## 2019-12-12 ENCOUNTER — Telehealth: Payer: Self-pay | Admitting: *Deleted

## 2019-12-12 VITALS — HR 105

## 2019-12-12 VITALS — BP 113/74 | HR 113 | Temp 97.8°F | Resp 18 | Ht 61.0 in | Wt 169.1 lb

## 2019-12-12 DIAGNOSIS — Z171 Estrogen receptor negative status [ER-]: Secondary | ICD-10-CM

## 2019-12-12 DIAGNOSIS — Z5189 Encounter for other specified aftercare: Secondary | ICD-10-CM | POA: Diagnosis not present

## 2019-12-12 DIAGNOSIS — C50412 Malignant neoplasm of upper-outer quadrant of left female breast: Secondary | ICD-10-CM

## 2019-12-12 DIAGNOSIS — D649 Anemia, unspecified: Secondary | ICD-10-CM | POA: Diagnosis not present

## 2019-12-12 DIAGNOSIS — R05 Cough: Secondary | ICD-10-CM | POA: Diagnosis not present

## 2019-12-12 DIAGNOSIS — D701 Agranulocytosis secondary to cancer chemotherapy: Secondary | ICD-10-CM | POA: Diagnosis not present

## 2019-12-12 DIAGNOSIS — Z5111 Encounter for antineoplastic chemotherapy: Secondary | ICD-10-CM | POA: Diagnosis not present

## 2019-12-12 DIAGNOSIS — Z20822 Contact with and (suspected) exposure to covid-19: Secondary | ICD-10-CM | POA: Diagnosis not present

## 2019-12-12 DIAGNOSIS — R0902 Hypoxemia: Secondary | ICD-10-CM | POA: Diagnosis not present

## 2019-12-12 DIAGNOSIS — R110 Nausea: Secondary | ICD-10-CM | POA: Diagnosis not present

## 2019-12-12 DIAGNOSIS — C50912 Malignant neoplasm of unspecified site of left female breast: Secondary | ICD-10-CM | POA: Diagnosis present

## 2019-12-12 DIAGNOSIS — C7952 Secondary malignant neoplasm of bone marrow: Secondary | ICD-10-CM | POA: Diagnosis not present

## 2019-12-12 DIAGNOSIS — J90 Pleural effusion, not elsewhere classified: Secondary | ICD-10-CM | POA: Diagnosis not present

## 2019-12-12 DIAGNOSIS — Z95828 Presence of other vascular implants and grafts: Secondary | ICD-10-CM

## 2019-12-12 LAB — CMP (CANCER CENTER ONLY)
ALT: 31 U/L (ref 0–44)
AST: 23 U/L (ref 15–41)
Albumin: 2.8 g/dL — ABNORMAL LOW (ref 3.5–5.0)
Alkaline Phosphatase: 347 U/L — ABNORMAL HIGH (ref 38–126)
Anion gap: 10 (ref 5–15)
BUN: 8 mg/dL (ref 6–20)
CO2: 28 mmol/L (ref 22–32)
Calcium: 8.9 mg/dL (ref 8.9–10.3)
Chloride: 102 mmol/L (ref 98–111)
Creatinine: 0.66 mg/dL (ref 0.44–1.00)
GFR, Est AFR Am: >60 mL/min (ref >60)
GFR, Estimated: >60 mL/min (ref >60)
Glucose, Bld: 105 mg/dL — ABNORMAL HIGH (ref 70–99)
Potassium: 3.2 mmol/L — ABNORMAL LOW (ref 3.5–5.1)
Sodium: 140 mmol/L (ref 135–145)
Total Bilirubin: 0.4 mg/dL (ref 0.3–1.2)
Total Protein: 6.4 g/dL — ABNORMAL LOW (ref 6.5–8.1)

## 2019-12-12 LAB — CBC WITH DIFFERENTIAL (CANCER CENTER ONLY)
Abs Immature Granulocytes: 0.37 10*3/uL — ABNORMAL HIGH (ref 0.00–0.07)
Basophils Absolute: 0.1 10*3/uL (ref 0.0–0.1)
Basophils Relative: 1 %
Eosinophils Absolute: 0.1 10*3/uL (ref 0.0–0.5)
Eosinophils Relative: 2 %
HCT: 25.8 % — ABNORMAL LOW (ref 36.0–46.0)
Hemoglobin: 7.9 g/dL — ABNORMAL LOW (ref 12.0–15.0)
Immature Granulocytes: 5 %
Lymphocytes Relative: 25 %
Lymphs Abs: 1.8 10*3/uL (ref 0.7–4.0)
MCH: 28.3 pg (ref 26.0–34.0)
MCHC: 30.6 g/dL (ref 30.0–36.0)
MCV: 92.5 fL (ref 80.0–100.0)
Monocytes Absolute: 1.2 10*3/uL — ABNORMAL HIGH (ref 0.1–1.0)
Monocytes Relative: 16 %
Neutro Abs: 3.7 10*3/uL (ref 1.7–7.7)
Neutrophils Relative %: 51 %
Platelet Count: 267 10*3/uL (ref 150–400)
RBC: 2.79 MIL/uL — ABNORMAL LOW (ref 3.87–5.11)
RDW: 18 % — ABNORMAL HIGH (ref 11.5–15.5)
WBC Count: 7.2 10*3/uL (ref 4.0–10.5)
nRBC: 15.8 % — ABNORMAL HIGH (ref 0.0–0.2)

## 2019-12-12 MED ORDER — SODIUM CHLORIDE 0.9 % IV SOLN
600.0000 mg/m2 | Freq: Once | INTRAVENOUS | Status: AC
  Administered 2019-12-12: 1100 mg via INTRAVENOUS
  Filled 2019-12-12: qty 55

## 2019-12-12 MED ORDER — PALONOSETRON HCL INJECTION 0.25 MG/5ML
INTRAVENOUS | Status: AC
  Filled 2019-12-12: qty 5

## 2019-12-12 MED ORDER — SODIUM CHLORIDE 0.9% FLUSH
10.0000 mL | INTRAVENOUS | Status: DC | PRN
  Administered 2019-12-12: 13:00:00 10 mL
  Filled 2019-12-12: qty 10

## 2019-12-12 MED ORDER — SODIUM CHLORIDE 0.9 % IV SOLN
Freq: Once | INTRAVENOUS | Status: AC
  Filled 2019-12-12: qty 250

## 2019-12-12 MED ORDER — POTASSIUM CHLORIDE CRYS ER 20 MEQ PO TBCR: 20.0000 meq | EXTENDED_RELEASE_TABLET | Freq: Every day | ORAL | 1 refills | Status: DC

## 2019-12-12 MED ORDER — DEXAMETHASONE SODIUM PHOSPHATE 10 MG/ML IJ SOLN
INTRAMUSCULAR | Status: AC
  Filled 2019-12-12: qty 1

## 2019-12-12 MED ORDER — HEPARIN SOD (PORK) LOCK FLUSH 100 UNIT/ML IV SOLN
500.0000 [IU] | Freq: Once | INTRAVENOUS | Status: AC | PRN
  Administered 2019-12-12: 500 [IU]
  Filled 2019-12-12: qty 5

## 2019-12-12 MED ORDER — DOXORUBICIN HCL CHEMO IV INJECTION 2 MG/ML
60.0000 mg/m2 | Freq: Once | INTRAVENOUS | Status: AC
  Administered 2019-12-12: 110 mg via INTRAVENOUS
  Filled 2019-12-12: qty 55

## 2019-12-12 MED ORDER — OXYCODONE-ACETAMINOPHEN 5-325 MG PO TABS
ORAL_TABLET | ORAL | Status: AC
  Filled 2019-12-12: qty 1

## 2019-12-12 MED ORDER — DEXAMETHASONE SODIUM PHOSPHATE 10 MG/ML IJ SOLN
10.0000 mg | Freq: Once | INTRAMUSCULAR | Status: AC
  Administered 2019-12-12: 10:00:00 10 mg via INTRAVENOUS

## 2019-12-12 MED ORDER — OXYCODONE-ACETAMINOPHEN 5-325 MG PO TABS
1.0000 | ORAL_TABLET | Freq: Once | ORAL | Status: AC
  Administered 2019-12-12: 10:00:00 1 via ORAL

## 2019-12-12 MED ORDER — SODIUM CHLORIDE 0.9% FLUSH
10.0000 mL | Freq: Once | INTRAVENOUS | Status: AC
  Administered 2019-12-12: 10 mL
  Filled 2019-12-12: qty 10

## 2019-12-12 MED ORDER — PALONOSETRON HCL INJECTION 0.25 MG/5ML
0.2500 mg | Freq: Once | INTRAVENOUS | Status: AC
  Administered 2019-12-12: 0.25 mg via INTRAVENOUS

## 2019-12-12 MED ORDER — SODIUM CHLORIDE 0.9 % IV SOLN: 10.0000 mg | Freq: Once | INTRAVENOUS | Status: DC

## 2019-12-12 MED ORDER — SODIUM CHLORIDE 0.9 % IV SOLN
150.0000 mg | Freq: Once | INTRAVENOUS | Status: AC
  Administered 2019-12-12: 150 mg via INTRAVENOUS
  Filled 2019-12-12: qty 150

## 2019-12-12 NOTE — Patient Instructions (Signed)

## 2019-12-12 NOTE — Progress Notes (Signed)
Per Dr. Benay Spice: OK to treat today with Hgb 7.9. Will return next week for lab/blood bank sample and transfusion. OK to treat with pulse rate > 100.

## 2019-12-12 NOTE — Progress Notes (Signed)
[facility] [name] OFFICE PROGRESS NOTE   Diagnosis: Breast cancer  INTERVAL HISTORY:   Abigail [name] returns as scheduled.  She underwent a therapeutic thoracentesis yesterday.  Dyspnea improved.  She reports pain at the thoracentesis site.  The pain was not relieved with 2.5 mg of oxycodone yesterday afternoon.  No new complaint today.  Objective:  Vital signs in last 24 hours:  Blood pressure 113/74, pulse (!) 113, temperature 97.8 F (36.6 C), temperature source Temporal, resp. rate 18, height '5\' 1"'  (1.549 m), weight 169 lb 1.6 oz (76.7 kg), SpO2 98 %.     Lymphatics: Firm 1 cm right scalene and 1 cm left supraclavicular nodes Resp: Decreased breath sounds throughout the left chest with end inspiratory rales Cardio: Regular rate and rhythm GI: No hepatomegaly Vascular: Trace pitting edema at the lower leg bilaterally, edema throughout the left arm and hand  Skin: Skin thickening at the left upper anterior chest and back with multiple erythematous nodules at the left pectoral fold and in the left axilla   Portacath/PICC-without erythema  Lab Results:  Lab Results  Component Value Date   WBC 7.2 12/12/2019   HGB 7.9 (L) 12/12/2019   HCT 25.8 (L) 12/12/2019   MCV 92.5 12/12/2019   PLT 267 12/12/2019   NEUTROABS 3.7 12/12/2019    CMP  Lab Results  Component Value Date   NA 140 12/12/2019   K 3.2 (L) 12/12/2019   CL 102 12/12/2019   CO2 28 12/12/2019   GLUCOSE 105 (H) 12/12/2019   BUN 8 12/12/2019   CREATININE 0.66 12/12/2019   CALCIUM 8.9 12/12/2019   PROT 6.4 (L) 12/12/2019   ALBUMIN 2.8 (L) 12/12/2019   AST 23 12/12/2019   ALT 31 12/12/2019   ALKPHOS 347 (H) 12/12/2019   BILITOT 0.4 12/12/2019   GFRNONAA >60 12/12/2019   GFRAA >60 12/12/2019     Imaging:  DG Chest 1 View  Result Date: 12/11/2019 CLINICAL DATA:  54 year old female status post ultrasound-guided left side thoracentesis this afternoon. Metastatic breast cancer. EXAM: CHEST  1  VIEW COMPARISON:  Chest radiographs 11/28/2019 and earlier. FINDINGS: PA view at 1318 hours. No pneumothorax. Residual left side pleural effusion similar to that last month. Small or trace right pleural effusion also now suspected. Stable right chest power port. Stable cardiac size and mediastinal contours. Stable nonspecific bilateral pulmonary reticulonodular opacity. Abnormal bone mineralization. The left glenohumeral joint has become dislocated since January. Negative visible bowel gas pattern. IMPRESSION: 1. No pneumothorax following Left side thoracentesis. Small left greater than right pleural effusions. 2. Osseous metastatic disease with dislocated left glenohumeral joint since January. 3. Stable nonspecific pulmonary interstitial opacity. Electronically Signed   By: Genevie Ann M.D.   On: 12/11/2019 15:28   US Thoracentesis Asp Pleural space w/IMG guide  Result Date: 12/11/2019 INDICATION: Patient with history of metastatic breast cancer, recurrent symptomatic left malignant pleural effusion. Request to IR for therapeutic left thoracentesis. EXAM: ULTRASOUND GUIDED LEFT THORACENTESIS MEDICATIONS: 10 mL 1% lidocaine COMPLICATIONS: None immediate. PROCEDURE: An ultrasound guided thoracentesis was thoroughly discussed with the patient and questions answered. The benefits, risks, alternatives and complications were also discussed. The patient understands and wishes to proceed with the procedure. Written consent was obtained. Ultrasound was performed to localize and mark an adequate pocket of fluid in the left chest. The area was then prepped and draped in the normal sterile fashion. 1% Lidocaine was used for local anesthesia. Under ultrasound guidance a 6 Fr Safe-T-Centesis catheter was introduced. Thoracentesis  was performed. The catheter was removed and a dressing applied. FINDINGS: A total of approximately 750 mL of clear yellow fluid was removed. IMPRESSION: Successful ultrasound guided left thoracentesis  yielding 750 mL of pleural fluid. Read by Candiss Norse, PA-C Electronically Signed   By: Markus Daft M.D.   On: 12/11/2019 16:16    Medications: I have reviewed the patient's current medications.   Assessment/Plan: 1. Metastatic breast cancer  CT chest 10/25/2019-left breast mass left axillary lymph nodes, abnormal appearance of the left concerning for tumor involvement, mediastinal adenopathy, diffuse sclerotic and lytic lesions, left pleural effusion  CT abdomen/pelvis 10/26/2019 enhancing left breast mass with left axillary lymphadenopathy and tumor involved the left chest wall the latissimus dorsi segment 4B liver lesion, diffuse lytic/sclerotic lesions  Ultrasound-guided biopsy of left liver lesion 10/29/1999-metastatic carcinoma, cytokeratin andGATA3positive consistent with metastatic breast cancer. HER-2 negative, ER negative, PR negative, Ki-6720%. PD-L1 <1% expression   [employer] hereditary panel negative  Initiated systemic chemotherapy weekly abraxane 100 mg/m2 days 1, 8, 15 q28 days   Cycle 1 day 1 on 11/14/19  Cycle 1 day 8 on 11/21/19 (hg 7.5, receive 1 unit RBCs)  Cycle 1 day 15 on 11/28/2019  Cycle 1 Adriamycin/Cytoxan 12/12/2019 2. Left upper extremity DVT-left subclavian, axillary, and brachial vein thrombosis confirmed on  Anticoagulation with apixaban, changed to parenteral hospital admission1/14/2021transition to twice daily Lovenox at discharge; transition to once daily 11/08/2019. 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   2D echo 11/08/2019-ejection fraction 55 to 60%, no pericardial effusion 7.   Tachycardia-likely multifactorial secondary to cancer burden, anemia, pleural effusion-improved    Disposition: Abigail [name] has metastatic breast cancer.  She has  completed 3 treatments with Abraxane.  She has not experienced significant clinical improvement.  There is persistent nodularity at the left chest wall, persistent left arm edema, and she is undergoing repeat therapeutic thoracentesis procedures.  I recommend changing to Adriamycin/Cytoxan.  We reviewed potential toxicities associated with the Adriamycin/Cytoxan regimen including the chance for nausea/vomiting, mucositis, alopecia, and hematologic toxicity.  We discussed the cardiac toxicity and vesicant property of doxorubicin.  She agrees to proceed.  She will receive G-CSF support.  We reviewed the bone pain, rash, and splenic rupture associated with G-CSF.  Abigail [name] has anemia secondary to chemotherapy and metastatic breast cancer involving the bones.  She will return for an office visit and CBC in 1 week.  We will provide transfusion support as needed.  Betsy Coder, MD  12/12/2019  9:37 AM

## 2019-12-12 NOTE — Patient Instructions (Signed)
[facility] Discharge Instructions for Patients Receiving Chemotherapy  Today you received the following chemotherapy agents Adriamycin & Cytoxan  To help prevent nausea and vomiting after your treatment, we encourage you to take your nausea medication as prescribed.   If you develop nausea and vomiting that is not controlled by your nausea medication, call the clinic.   BELOW ARE SYMPTOMS THAT SHOULD BE REPORTED IMMEDIATELY:  *FEVER GREATER THAN 100.5 F  *CHILLS WITH OR WITHOUT FEVER  NAUSEA AND VOMITING THAT IS NOT CONTROLLED WITH YOUR NAUSEA MEDICATION  *UNUSUAL SHORTNESS OF BREATH  *UNUSUAL BRUISING OR BLEEDING  TENDERNESS IN MOUTH AND THROAT WITH OR WITHOUT PRESENCE OF ULCERS  *URINARY PROBLEMS  *BOWEL PROBLEMS  UNUSUAL RASH Items with * indicate a potential emergency and should be followed up as soon as possible.  Feel free to call the clinic should you have any questions or concerns. The clinic phone number is [phone].  Please show the [facility] at check-in to the Emergency Department and triage nurse.  Doxorubicin injection What is this medicine? DOXORUBICIN (dox oh ROO bi sin) is a chemotherapy drug. It is used to treat many kinds of cancer like leukemia, lymphoma, neuroblastoma, sarcoma, and Wilms' tumor. It is also used to treat bladder cancer, breast cancer, lung cancer, ovarian cancer, stomach cancer, and thyroid cancer. This medicine may be used for other purposes; ask your health care provider or pharmacist if you have questions. COMMON BRAND NAME(S): Adriamycin, Adriamycin PFS, Adriamycin RDF, Rubex What should I tell my health care provider before I take this medicine? They need to know if you have any of these conditions:  heart disease  history of low blood counts caused by a medicine  liver disease  recent or ongoing radiation therapy  an unusual or allergic reaction to doxorubicin, other chemotherapy agents, other  medicines, foods, dyes, or preservatives  pregnant or trying to get pregnant  breast-feeding How should I use this medicine? This drug is given as an infusion into a vein. It is administered in a hospital or clinic by a specially trained health care professional. If you have pain, swelling, burning or any unusual feeling around the site of your injection, tell your health care professional right away. Talk to your pediatrician regarding the use of this medicine in children. Special care may be needed. Overdosage: If you think you have taken too much of this medicine contact a poison control center or emergency room at once. NOTE: This medicine is only for you. Do not share this medicine with others. What if I miss a dose? It is important not to miss your dose. Call your doctor or health care professional if you are unable to keep an appointment. What may interact with this medicine? This medicine may interact with the following medications:  6-mercaptopurine  paclitaxel  phenytoin  St. John's Wort  trastuzumab  verapamil This list may not describe all possible interactions. Give your health care provider a list of all the medicines, herbs, non-prescription drugs, or dietary supplements you use. Also tell them if you smoke, drink alcohol, or use illegal drugs. Some items may interact with your medicine. What should I watch for while using this medicine? This drug may make you feel generally unwell. This is not uncommon, as chemotherapy can affect healthy cells as well as cancer cells. Report any side effects. Continue your course of treatment even though you feel ill unless your doctor tells you to stop. There is a maximum amount  this medicine you should receive throughout your life. The amount depends on the medical condition being treated and your overall health. Your doctor will watch how much of this medicine you receive in your lifetime. Tell your doctor if you have taken this  medicine before. You may need blood work done while you are taking this medicine. Your urine may turn red for a few days after your dose. This is not blood. If your urine is dark or brown, call your doctor. In some cases, you may be given additional medicines to help with side effects. Follow all directions for their use. Call your doctor or health care professional for advice if you get a fever, chills or sore throat, or other symptoms of a cold or flu. Do not treat yourself. This drug decreases your body's ability to fight infections. Try to avoid being around people who are sick. This medicine may increase your risk to bruise or bleed. Call your doctor or health care professional if you notice any unusual bleeding. Talk to your doctor about your risk of cancer. You may be more at risk for certain types of cancers if you take this medicine. Do not become pregnant while taking this medicine or for 6 months after stopping it. Women should inform their doctor if they wish to become pregnant or think they might be pregnant. Men should not father a child while taking this medicine and for 6 months after stopping it. There is a potential for serious side effects to an unborn child. Talk to your health care professional or pharmacist for more information. Do not breast-feed an infant while taking this medicine. This medicine has caused ovarian failure in some women and reduced sperm counts in some men This medicine may interfere with the ability to have a child. Talk with your doctor or health care professional if you are concerned about your fertility. This medicine may cause a decrease in Co-Enzyme Q-10. You should make sure that you get enough Co-Enzyme Q-10 while you are taking this medicine. Discuss the foods you eat and the vitamins you take with your health care professional. What side effects may I notice from receiving this medicine? Side effects that you should report to your doctor or health care  professional as soon as possible:  allergic reactions like skin rash, itching or hives, swelling of the face, lips, or tongue  breathing problems  chest pain  fast or irregular heartbeat  low blood counts - this medicine may decrease the number of white blood cells, red blood cells and platelets. You may be at increased risk for infections and bleeding.  pain, redness, or irritation at site where injected  signs of infection - fever or chills, cough, sore throat, pain or difficulty passing urine  signs of decreased platelets or bleeding - bruising, pinpoint red spots on the skin, black, tarry stools, blood in the urine  swelling of the ankles, feet, hands  tiredness  weakness Side effects that usually do not require medical attention (report to your doctor or health care professional if they continue or are bothersome):  diarrhea  hair loss  mouth sores  nail discoloration or damage  nausea  red colored urine  vomiting This list may not describe all possible side effects. Call your doctor for medical advice about side effects. You may report side effects to FDA at 1-800-FDA-1088. Where should I keep my medicine? This drug is given in a hospital or clinic and will not be stored at home. NOTE:   This sheet is a summary. It may not cover all possible information. If you have questions about this medicine, talk to your doctor, pharmacist, or health care provider.  2020 Elsevier/Gold Standard (2017-05-11 11:01:26)  Cyclophosphamide Injection What is this medicine? CYCLOPHOSPHAMIDE (sye kloe FOSS fa mide) is a chemotherapy drug. It slows the growth of cancer cells. This medicine is used to treat many types of cancer like lymphoma, myeloma, leukemia, breast cancer, and ovarian cancer, to name a few. This medicine may be used for other purposes; ask your health care provider or pharmacist if you have questions. COMMON BRAND NAME(S): Cytoxan, Neosar What should I tell my health  care provider before I take this medicine? They need to know if you have any of these conditions:  heart disease  history of irregular heartbeat  infection  kidney disease  liver disease  low blood counts, like white cells, platelets, or red blood cells  on hemodialysis  recent or ongoing radiation therapy  scarring or thickening of the lungs  trouble passing urine  an unusual or allergic reaction to cyclophosphamide, other medicines, foods, dyes, or preservatives  pregnant or trying to get pregnant  breast-feeding How should I use this medicine? This drug is usually given as an injection into a vein or muscle or by infusion into a vein. It is administered in a hospital or clinic by a specially trained health care professional. Talk to your pediatrician regarding the use of this medicine in children. Special care may be needed. Overdosage: If you think you have taken too much of this medicine contact a poison control center or emergency room at once. NOTE: This medicine is only for you. Do not share this medicine with others. What if I miss a dose? It is important not to miss your dose. Call your doctor or health care professional if you are unable to keep an appointment. What may interact with this medicine?  amphotericin B  azathioprine  certain antivirals for HIV or hepatitis  certain medicines for blood pressure, heart disease, irregular heart beat  certain medicines that treat or prevent blood clots like warfarin  certain other medicines for cancer  cyclosporine  etanercept  indomethacin  medicines that relax muscles for surgery  medicines to increase blood counts  metronidazole This list may not describe all possible interactions. Give your health care provider a list of all the medicines, herbs, non-prescription drugs, or dietary supplements you use. Also tell them if you smoke, drink alcohol, or use illegal drugs. Some items may interact with your  medicine. What should I watch for while using this medicine? Your condition will be monitored carefully while you are receiving this medicine. You may need blood work done while you are taking this medicine. Drink water or other fluids as directed. Urinate often, even at night. Some products may contain alcohol. Ask your health care professional if this medicine contains alcohol. Be sure to tell all health care professionals you are taking this medicine. Certain medicines, like metronidazole and disulfiram, can cause an unpleasant reaction when taken with alcohol. The reaction includes flushing, headache, nausea, vomiting, sweating, and increased thirst. The reaction can last from 30 minutes to several hours. Do not become pregnant while taking this medicine or for 1 year after stopping it. Women should inform their health care professional if they wish to become pregnant or think they might be pregnant. Men should not father a child while taking this medicine and for 4 months after stopping it. There is potential   potential for serious side effects to an unborn child. Talk to your health care professional for more information. Do not breast-feed an infant while taking this medicine or for 1 week after stopping it. This medicine has caused ovarian failure in some women. This medicine may make it more difficult to get pregnant. Talk to your health care professional if you are concerned about your fertility. This medicine has caused decreased sperm counts in some men. This may make it more difficult to father a child. Talk to your health care professional if you are concerned about your fertility. Call your health care professional for advice if you get a fever, chills, or sore throat, or other symptoms of a cold or flu. Do not treat yourself. This medicine decreases your body's ability to fight infections. Try to avoid being around people who are sick. Avoid taking medicines that contain aspirin, acetaminophen,  ibuprofen, naproxen, or ketoprofen unless instructed by your health care professional. These medicines may hide a fever. Talk to your health care professional about your risk of cancer. You may be more at risk for certain types of cancer if you take this medicine. If you are going to need surgery or other procedure, tell your health care professional that you are using this medicine. Be careful brushing or flossing your teeth or using a toothpick because you may get an infection or bleed more easily. If you have any dental work done, tell your dentist you are receiving this medicine. What side effects may I notice from receiving this medicine? Side effects that you should report to your doctor or health care professional as soon as possible:  allergic reactions like skin rash, itching or hives, swelling of the face, lips, or tongue  breathing problems  nausea, vomiting  signs and symptoms of bleeding such as bloody or black, tarry stools; red or dark brown urine; spitting up blood or brown material that looks like coffee grounds; red spots on the skin; unusual bruising or bleeding from the eyes, gums, or nose  signs and symptoms of heart failure like fast, irregular heartbeat, sudden weight gain; swelling of the ankles, feet, hands  signs and symptoms of infection like fever; chills; cough; sore throat; pain or trouble passing urine  signs and symptoms of kidney injury like trouble passing urine or change in the amount of urine  signs and symptoms of liver injury like dark yellow or brown urine; general ill feeling or flu-like symptoms; light-colored stools; loss of appetite; nausea; right upper belly pain; unusually weak or tired; yellowing of the eyes or skin Side effects that usually do not require medical attention (report to your doctor or health care professional if they continue or are bothersome):  confusion  decreased hearing  diarrhea  facial flushing  hair  loss  headache  loss of appetite  missed menstrual periods  signs and symptoms of low red blood cells or anemia such as unusually weak or tired; feeling faint or lightheaded; falls  skin discoloration This list may not describe all possible side effects. Call your doctor for medical advice about side effects. You may report side effects to FDA at 1-800-FDA-1088. Where should I keep my medicine? This drug is given in a hospital or clinic and will not be stored at home. NOTE: This sheet is a summary. It may not cover all possible information. If you have questions about this medicine, talk to your doctor, pharmacist, or health care provider.  2020 Elsevier/Gold Standard (2019-07-02 09:53:29)  COVID-19 Vaccine  Information can be found at: ShippingScam.co.uk For questions related to vaccine distribution or appointments, please email vaccine@[email or web address] [email or web address] or call [phone].

## 2019-12-12 NOTE — Telephone Encounter (Signed)
Voice mail from Abigail [name] at [place] requesting doses of chemotherapy treatments. Call back and reference claim [id number]. Dates of tx 2/3, 2/10, 2/17 and received Abraxane 175 mg Date of tx 12/12/19-Adriamycin 110 mg + Cytoxan 1100 mg Attempted to return call, but caller did not leave extension and was not able to speak with a customer service person--transferred over to several automated messages without success.

## 2019-12-12 NOTE — Progress Notes (Signed)
DISCONTINUE ON PATHWAY REGIMEN - Breast     A cycle is every 28 days:     Atezolizumab      Nab-paclitaxel (protein bound)   **Always confirm dose/schedule in your pharmacy ordering system**  REASON: Disease Progression PRIOR TREATMENT: [id number]: Atezolizumab 840 mg D1, 15 + Nab-Paclitaxel (Abraxane) 100 mg/m2 D1, 8, 15 q28 Days TREATMENT RESPONSE: Progressive Disease (PD)  START OFF PATHWAY REGIMEN - Breast   [id number]:Doxorubicin + Cyclophosphamide (AC):   A cycle is every 21 days:     Doxorubicin      Cyclophosphamide   **Always confirm dose/schedule in your pharmacy ordering system**  Patient Characteristics: Distant Metastases or Locoregional Recurrent Disease - Unresected or Locally Advanced Unresectable Disease Progressing after Neoadjuvant and Local Therapies, HER2 Negative/Unknown/Equivocal, ER Negative/Unknown, Chemotherapy, Second Line Therapeutic Status: Distant Metastases BRCA Mutation Status: Absent ER Status: Negative (-) HER2 Status: Negative (-) PR Status: Negative (-) Line of Therapy: Second Line Intent of Therapy: Non-Curative / Palliative Intent, Discussed with Patient

## 2019-12-12 NOTE — Telephone Encounter (Signed)
-----   Message from Ladell Pier, MD sent at 12/12/2019  1:57 PM EST ----- Please call patient, potassium is low, start potassium chloride 20 mEq daily

## 2019-12-12 NOTE — Telephone Encounter (Signed)
Called patient with low K+ and she agrees to pick up script for potassium chloride 20 meq daily. Also informed her that IR called to inquire when to schedule the pleurx drain and Dr. Benay Spice said to hold off for now--if she has a response to the [facility] treatment, it may not be necessary. She is agreeable to the wait for now plan.

## 2019-12-13 NOTE — Progress Notes (Signed)
Pharmacist Chemotherapy Monitoring - Initial Assessment    Anticipated start date: 12/12/2019   Regimen:  . Are orders appropriate based on the patient's diagnosis, regimen, and cycle? Yes . Does the plan date match the patient's scheduled date? Yes . Is the sequencing of drugs appropriate? Yes . Are the premedications appropriate for the patient's regimen? Yes . Prior Authorization for treatment is: Approved o If applicable, is the correct biosimilar selected based on the patient's insurance? Yes   Organ Function and Labs: Marland Kitchen Are dose adjustments needed based on the patient's renal function, hepatic function, or hematologic function? No . Are appropriate labs ordered prior to the start of patient's treatment? Yes . Other organ system assessment, if indicated: anthracycline: ECHO  Dose Assessment: . Are the drug doses appropriate? Yes . Are the following correct: o Drug concentrations Yes o IV fluid compatible with drug Yes o Administration routes Yes o Timing of therapy Yes . If applicable, have lifetime cumulative doses been properly documented and assessed? yes Lifetime Dose Tracking  . Doxorubicin: 60.44 mg/m2 (110 mg) = 13.43 % of the maximum lifetime dose of 450 mg/m2  o   Toxicity Monitoring/Prevention: . The patient has the following take home antiemetics prescribed: Ondansetron and Prochlorperazine . Medication allergies and previous infusion related reactions, if applicable, have been reviewed and addressed. Yes . The patient's current medication list has been assessed for drug-drug interactions with their chemotherapy regimen. no significant drug-drug interactions were identified on review.  Order Review: . Are the treatment plan orders signed? Yes . Is the patient scheduled to see a provider prior to their treatment? Yes  I verify that I have reviewed each item in the above checklist and answered each question accordingly.  Norwood Levo [facility] 12/13/2019 9:04 AM

## 2019-12-14 ENCOUNTER — Other Ambulatory Visit: Payer: Self-pay

## 2019-12-14 ENCOUNTER — Inpatient Hospital Stay: Payer: 59

## 2019-12-14 VITALS — BP 121/72 | HR 114 | Temp 98.2°F | Resp 16

## 2019-12-14 DIAGNOSIS — C50412 Malignant neoplasm of upper-outer quadrant of left female breast: Secondary | ICD-10-CM

## 2019-12-14 DIAGNOSIS — Z5111 Encounter for antineoplastic chemotherapy: Secondary | ICD-10-CM | POA: Diagnosis not present

## 2019-12-14 MED ORDER — PEGFILGRASTIM-CBQV 6 MG/0.6ML ~~LOC~~ SOSY
PREFILLED_SYRINGE | SUBCUTANEOUS | Status: AC
  Filled 2019-12-14: qty 0.6

## 2019-12-14 MED ORDER — PEGFILGRASTIM-CBQV 6 MG/0.6ML ~~LOC~~ SOSY
6.0000 mg | PREFILLED_SYRINGE | Freq: Once | SUBCUTANEOUS | Status: AC
  Administered 2019-12-14: 12:00:00 6 mg via SUBCUTANEOUS

## 2019-12-14 NOTE — Patient Instructions (Signed)

## 2019-12-19 ENCOUNTER — Inpatient Hospital Stay: Payer: 59

## 2019-12-19 ENCOUNTER — Emergency Department (HOSPITAL_COMMUNITY): Payer: 59

## 2019-12-19 ENCOUNTER — Inpatient Hospital Stay (HOSPITAL_COMMUNITY)
Admission: EM | Admit: 2019-12-19 | Discharge: 2019-12-24 | DRG: 597 | Disposition: A | Discharge status: Home Health | Payer: 59 | Source: Ambulatory Visit | Attending: Internal Medicine | Admitting: Internal Medicine

## 2019-12-19 ENCOUNTER — Encounter: Payer: Self-pay | Admitting: *Deleted

## 2019-12-19 ENCOUNTER — Encounter: Payer: Self-pay | Admitting: Nurse Practitioner

## 2019-12-19 ENCOUNTER — Telehealth: Payer: Self-pay

## 2019-12-19 ENCOUNTER — Inpatient Hospital Stay: Payer: 59 | Admitting: Nurse Practitioner

## 2019-12-19 ENCOUNTER — Other Ambulatory Visit: Payer: Self-pay

## 2019-12-19 VITALS — BP 121/75 | HR 102 | Temp 97.2°F | Resp 17 | Ht 61.0 in | Wt 166.5 lb

## 2019-12-19 DIAGNOSIS — Z6831 Body mass index (BMI) 31.0-31.9, adult: Secondary | ICD-10-CM

## 2019-12-19 DIAGNOSIS — T451X5A Adverse effect of antineoplastic and immunosuppressive drugs, initial encounter: Secondary | ICD-10-CM | POA: Diagnosis present

## 2019-12-19 DIAGNOSIS — Z171 Estrogen receptor negative status [ER-]: Secondary | ICD-10-CM | POA: Diagnosis not present

## 2019-12-19 DIAGNOSIS — J9601 Acute respiratory failure with hypoxia: Secondary | ICD-10-CM | POA: Diagnosis present

## 2019-12-19 DIAGNOSIS — D701 Agranulocytosis secondary to cancer chemotherapy: Secondary | ICD-10-CM | POA: Diagnosis present

## 2019-12-19 DIAGNOSIS — J181 Lobar pneumonia, unspecified organism: Secondary | ICD-10-CM | POA: Diagnosis not present

## 2019-12-19 DIAGNOSIS — E669 Obesity, unspecified: Secondary | ICD-10-CM | POA: Diagnosis present

## 2019-12-19 DIAGNOSIS — E785 Hyperlipidemia, unspecified: Secondary | ICD-10-CM | POA: Diagnosis present

## 2019-12-19 DIAGNOSIS — Z95828 Presence of other vascular implants and grafts: Secondary | ICD-10-CM

## 2019-12-19 DIAGNOSIS — J90 Pleural effusion, not elsewhere classified: Secondary | ICD-10-CM | POA: Diagnosis not present

## 2019-12-19 DIAGNOSIS — C50919 Malignant neoplasm of unspecified site of unspecified female breast: Secondary | ICD-10-CM | POA: Diagnosis present

## 2019-12-19 DIAGNOSIS — D61810 Antineoplastic chemotherapy induced pancytopenia: Secondary | ICD-10-CM | POA: Diagnosis present

## 2019-12-19 DIAGNOSIS — C50412 Malignant neoplasm of upper-outer quadrant of left female breast: Secondary | ICD-10-CM

## 2019-12-19 DIAGNOSIS — C7952 Secondary malignant neoplasm of bone marrow: Secondary | ICD-10-CM | POA: Diagnosis present

## 2019-12-19 DIAGNOSIS — C50912 Malignant neoplasm of unspecified site of left female breast: Secondary | ICD-10-CM | POA: Diagnosis present

## 2019-12-19 DIAGNOSIS — R0602 Shortness of breath: Secondary | ICD-10-CM | POA: Diagnosis present

## 2019-12-19 DIAGNOSIS — I890 Lymphedema, not elsewhere classified: Secondary | ICD-10-CM | POA: Diagnosis present

## 2019-12-19 DIAGNOSIS — J189 Pneumonia, unspecified organism: Secondary | ICD-10-CM

## 2019-12-19 DIAGNOSIS — E876 Hypokalemia: Secondary | ICD-10-CM | POA: Diagnosis not present

## 2019-12-19 DIAGNOSIS — Z86718 Personal history of other venous thrombosis and embolism: Secondary | ICD-10-CM | POA: Diagnosis not present

## 2019-12-19 DIAGNOSIS — J910 Malignant pleural effusion: Secondary | ICD-10-CM | POA: Diagnosis present

## 2019-12-19 DIAGNOSIS — Z20822 Contact with and (suspected) exposure to covid-19: Secondary | ICD-10-CM | POA: Diagnosis present

## 2019-12-19 DIAGNOSIS — Y95 Nosocomial condition: Secondary | ICD-10-CM | POA: Diagnosis present

## 2019-12-19 LAB — CBC WITH DIFFERENTIAL (CANCER CENTER ONLY)
Abs Immature Granulocytes: 0 10*3/uL (ref 0.00–0.07)
Basophils Absolute: 0 10*3/uL (ref 0.0–0.1)
Basophils Relative: 0 %
Eosinophils Absolute: 0 10*3/uL (ref 0.0–0.5)
Eosinophils Relative: 6 %
HCT: 24.4 % — ABNORMAL LOW (ref 36.0–46.0)
Hemoglobin: 7.2 g/dL — ABNORMAL LOW (ref 12.0–15.0)
Immature Granulocytes: 0 %
Lymphocytes Relative: 81 %
Lymphs Abs: 0.5 10*3/uL — ABNORMAL LOW (ref 0.7–4.0)
MCH: 27 pg (ref 26.0–34.0)
MCHC: 29.5 g/dL — ABNORMAL LOW (ref 30.0–36.0)
MCV: 91.4 fL (ref 80.0–100.0)
Monocytes Absolute: 0 10*3/uL — ABNORMAL LOW (ref 0.1–1.0)
Monocytes Relative: 5 %
Neutro Abs: 0.1 10*3/uL — CL (ref 1.7–7.7)
Neutrophils Relative %: 8 %
Platelet Count: 119 10*3/uL — ABNORMAL LOW (ref 150–400)
RBC: 2.67 MIL/uL — ABNORMAL LOW (ref 3.87–5.11)
RDW: 16.9 % — ABNORMAL HIGH (ref 11.5–15.5)
WBC Count: 0.6 10*3/uL — CL (ref 4.0–10.5)
nRBC: 0 % (ref 0.0–0.2)

## 2019-12-19 LAB — BASIC METABOLIC PANEL
Anion gap: 12 (ref 5–15)
BUN: 9 mg/dL (ref 6–20)
CO2: 24 mmol/L (ref 22–32)
Calcium: 8.6 mg/dL — ABNORMAL LOW (ref 8.9–10.3)
Chloride: 104 mmol/L (ref 98–111)
Creatinine, Ser: 0.36 mg/dL — ABNORMAL LOW (ref 0.44–1.00)
GFR calc Af Amer: >60 mL/min (ref >60)
GFR calc non Af Amer: >60 mL/min (ref >60)
Glucose, Bld: 92 mg/dL (ref 70–99)
Potassium: 3.9 mmol/L (ref 3.5–5.1)
Sodium: 140 mmol/L (ref 135–145)

## 2019-12-19 LAB — LACTIC ACID, PLASMA: Lactic Acid, Venous: 1.1 mmol/L (ref 0.5–1.9)

## 2019-12-19 LAB — CBC
HCT: 33.5 % — ABNORMAL LOW (ref 36.0–46.0)
Hemoglobin: 10.3 g/dL — ABNORMAL LOW (ref 12.0–15.0)
MCH: 28 pg (ref 26.0–34.0)
MCHC: 30.7 g/dL (ref 30.0–36.0)
MCV: 91 fL (ref 80.0–100.0)
Platelets: 135 10*3/uL — ABNORMAL LOW (ref 150–400)
RBC: 3.68 MIL/uL — ABNORMAL LOW (ref 3.87–5.11)
RDW: 16.3 % — ABNORMAL HIGH (ref 11.5–15.5)
WBC: 0.7 10*3/uL — CL (ref 4.0–10.5)
nRBC: 0 % (ref 0.0–0.2)

## 2019-12-19 LAB — SAMPLE TO BLOOD BANK

## 2019-12-19 LAB — PREPARE RBC (CROSSMATCH)

## 2019-12-19 IMAGING — CR DG CHEST 2V
3 series · 3 of 3 positions shown · non-contrast
Comparison: [REDACTED] [87]

CLINICAL DATA: Dyspnea.

EXAM:
CHEST - 2 VIEW

[w chest pa]
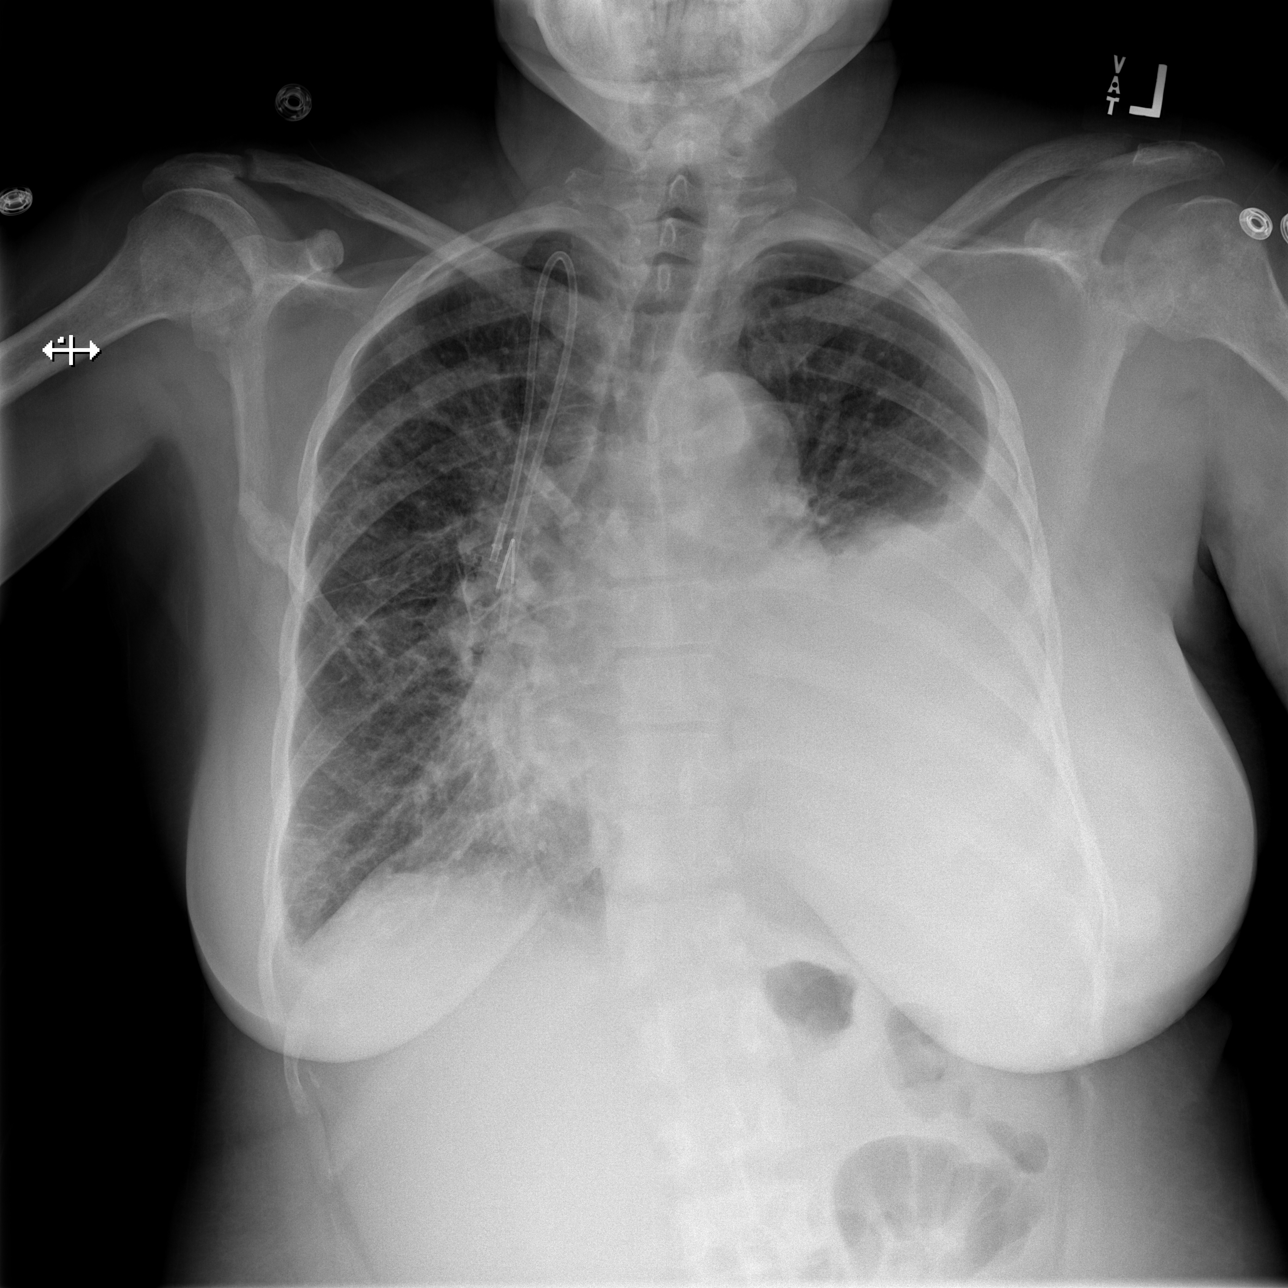

[w chest lat (1 of 2)]
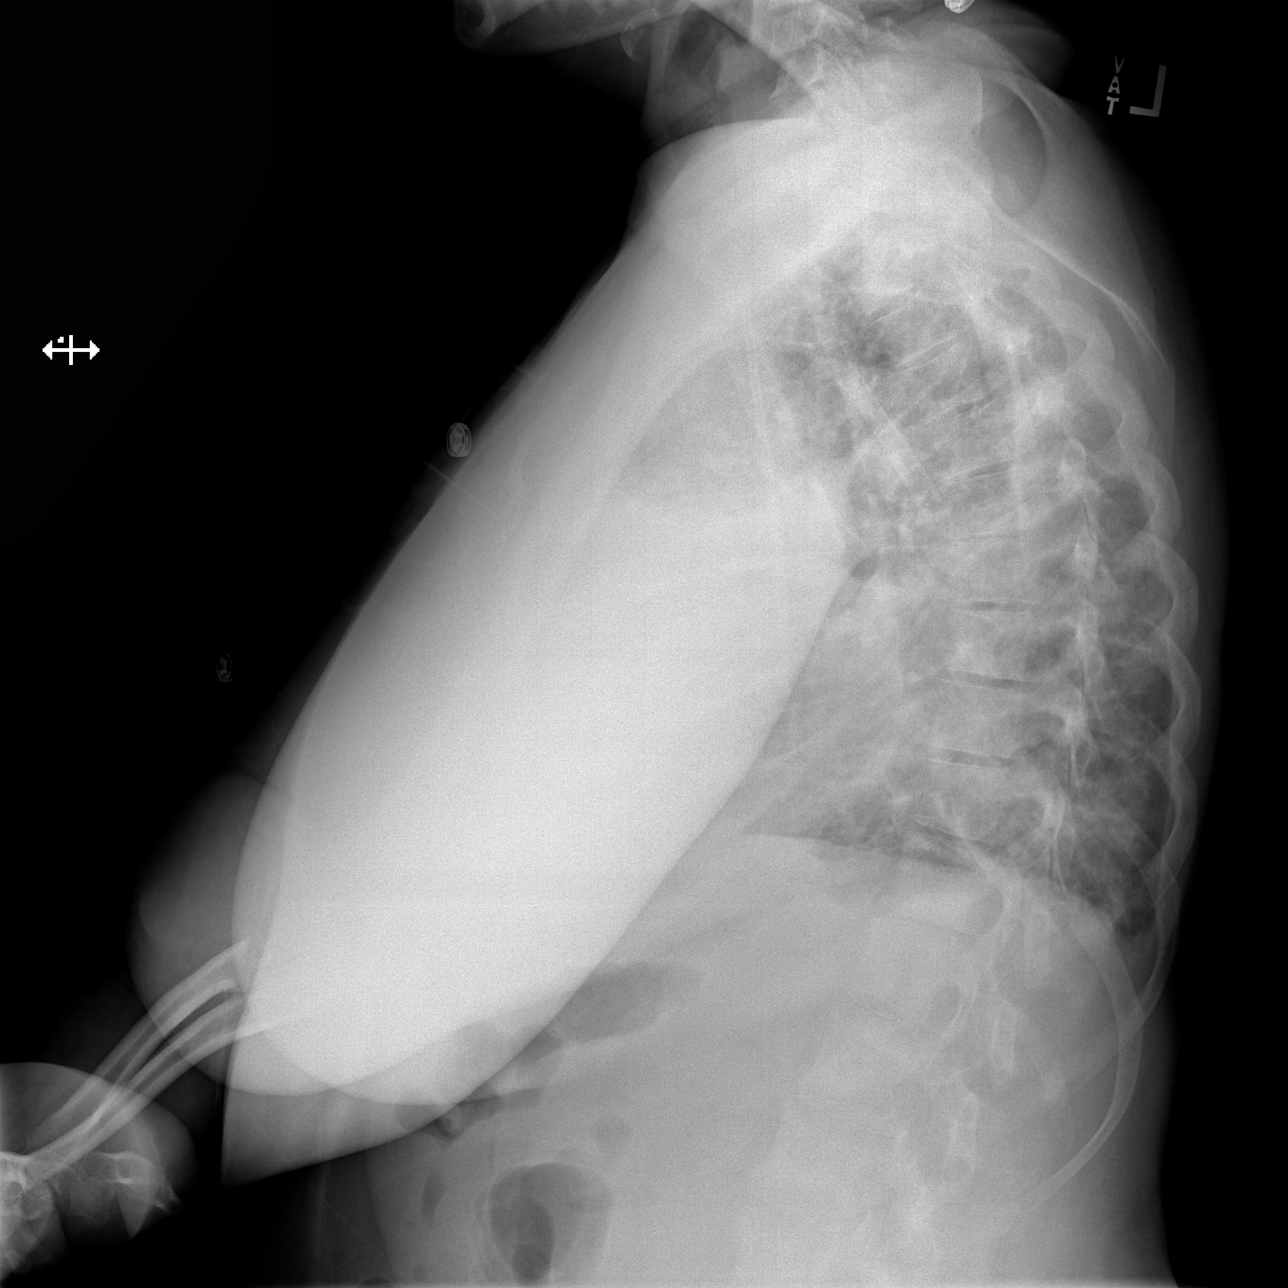

[w chest lat (2 of 2)]
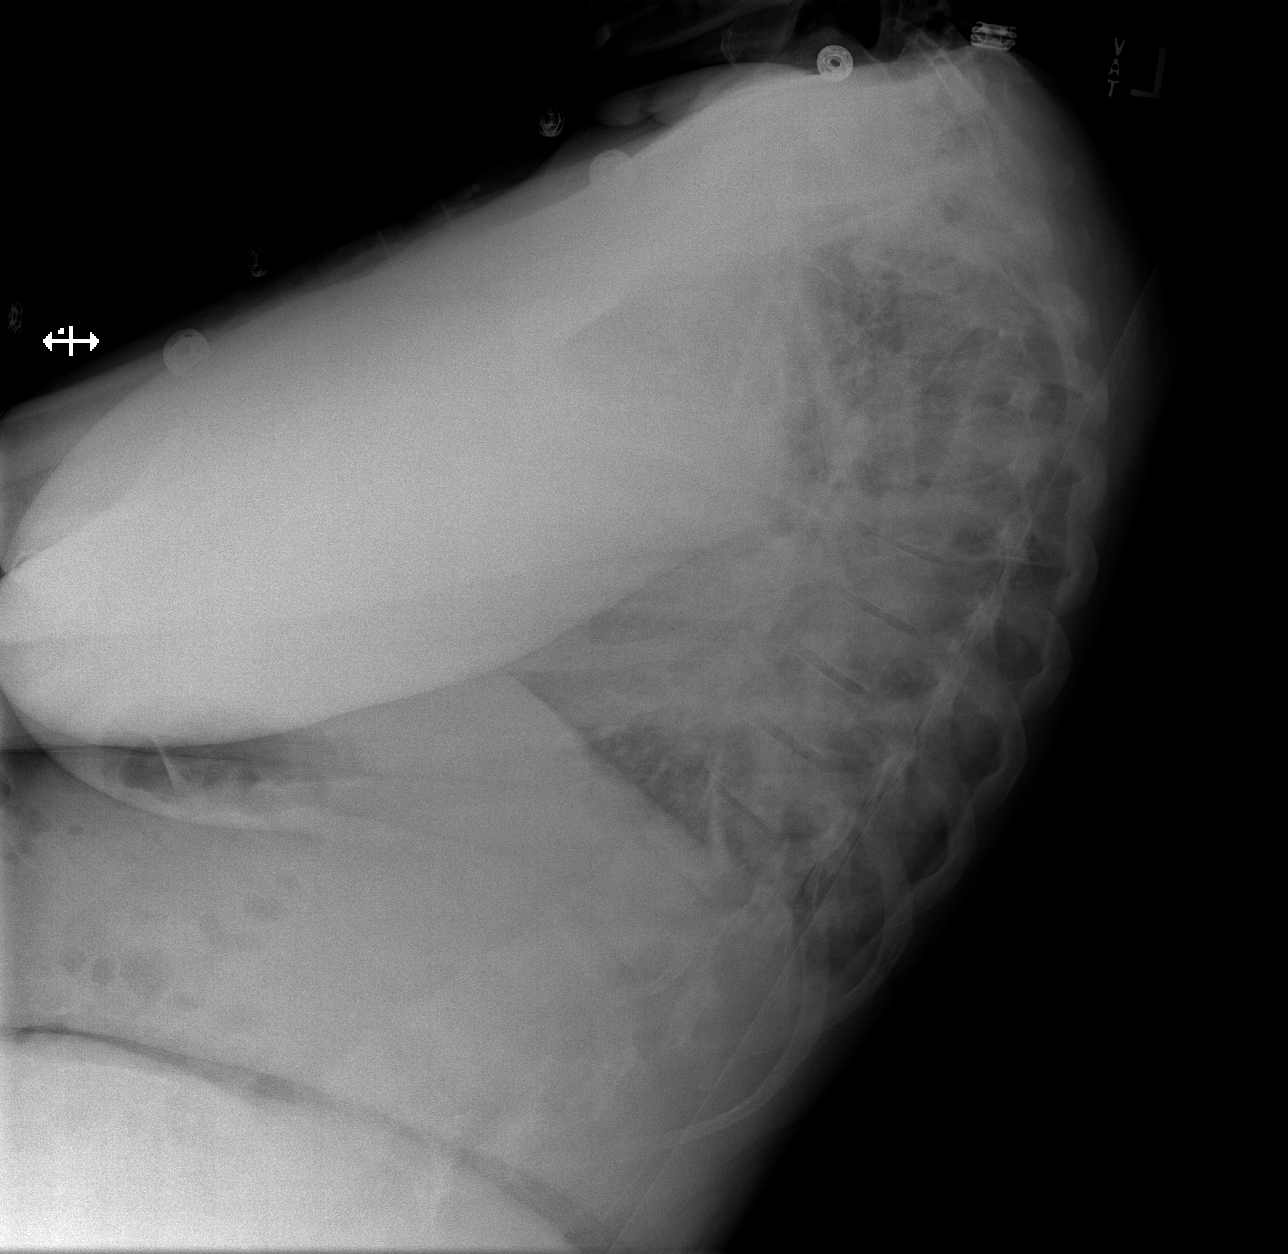

[3 of 3 positions shown; findings below may reference images not displayed]

FINDINGS: Redemonstration of a right internal jugular central vascular
catheter port, its tip projecting on the superior vena cavoatrial
junction. Similar lung inflation and subtle interstitial edema with
worsened pleural fluid and dense consolidation on the left that now
obscures the left heart border and continues to obscure the left
hemidiaphragm. Spinal dextrocurvature, mild skeletal degenerative
change, and aortic knob calcified atherosclerosis are
redemonstrated.
IMPRESSION: Interval worsened left pleural effusion and consolidation, possibly
malignant or bland pleural effusion with compressive atelectasis
versus a left pneumonia.

Stable central catheter port placement and similar persistent
interstitial edema.

Aortic calcified atherosclerosis.

## 2019-12-19 IMAGING — CT CT ANGIO CHEST
2 of 6 series · 18 of 46 positions shown · IV contrast (omnipaque)
Comparison: Post thoracentesis chest radiograph [DATE].
Two-view chest radiographs earlier today. CTA chest [DATE].

CLINICAL DATA: 53-year-old female with increased shortness of
breath. Metastatic breast cancer. Recent ultrasound-guided left side
thoracentesis.

EXAM:
CT ANGIOGRAPHY CHEST WITH CONTRAST
TECHNIQUE: Multidetector CT imaging of the chest was performed using the
standard protocol during bolus administration of intravenous
contrast. Multiplanar CT image reconstructions and MIPs were
obtained to evaluate the vascular anatomy.
CONTRAST:  100mL OMNIPAQUE IOHEXOL 350 MG/ML SOLN

[Series 6: thins · axial · 0.59mm/px · z∈[+1350,+1605]mm · 16 of 281 slices shown]
[im 13/281  lung]
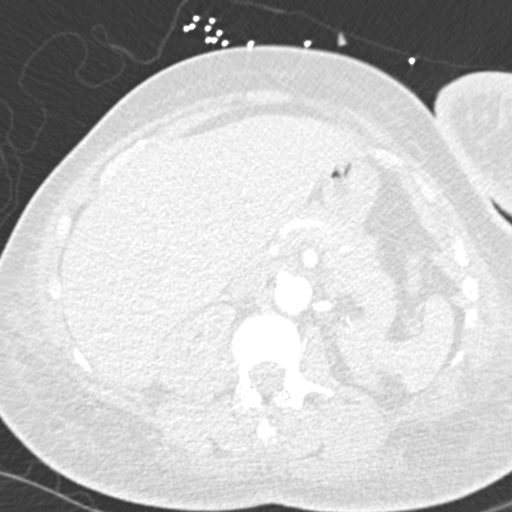
[im 37/281  soft-tissue]
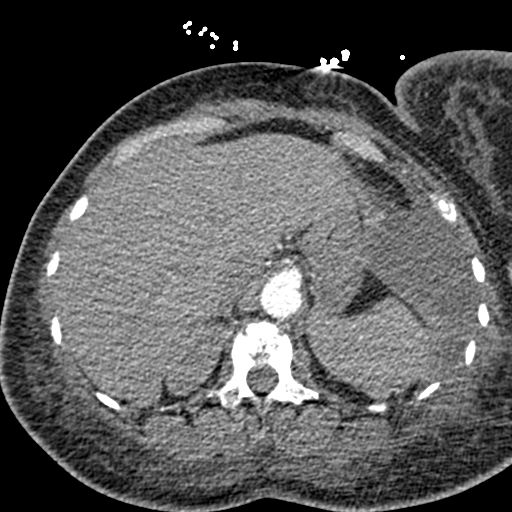
[im 49/281  lung]
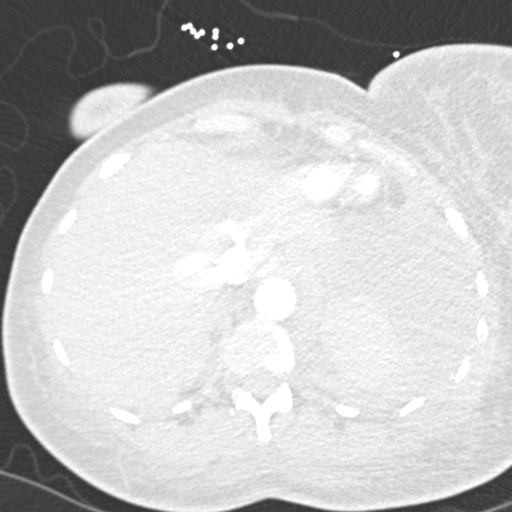
[im 61/281  soft-tissue]
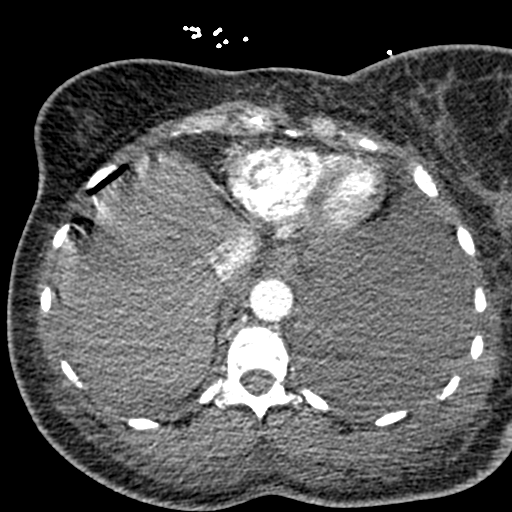
[im 86/281  lung]
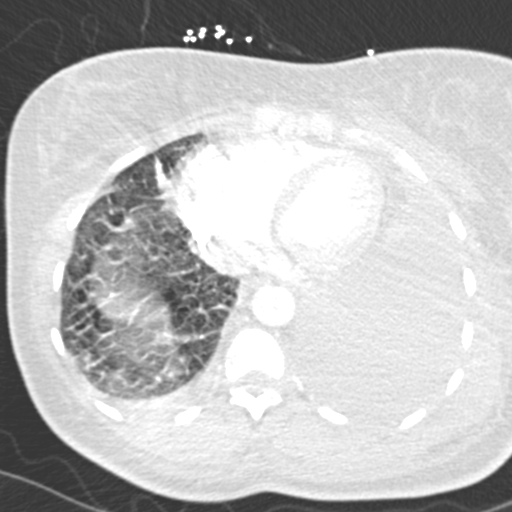
[im 98/281  soft-tissue]
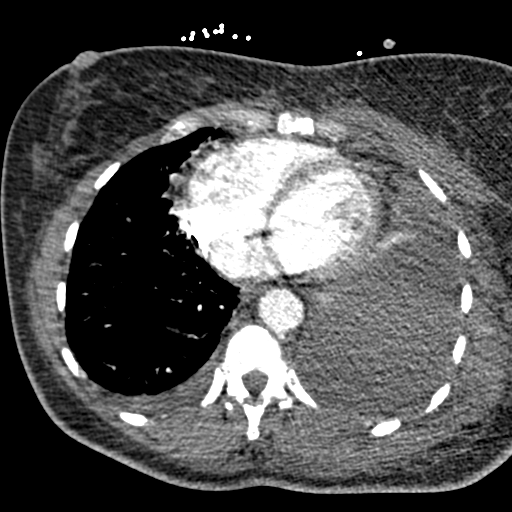
[im 110/281  lung]
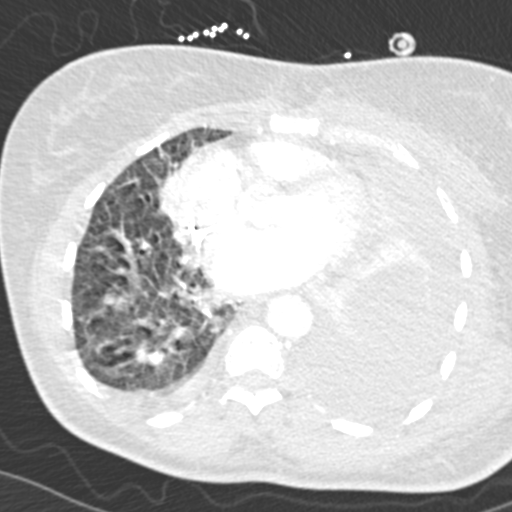
[im 134/281  soft-tissue]
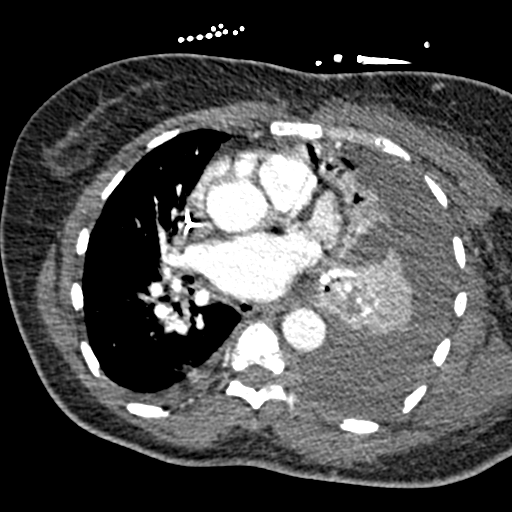
[im 147/281  lung]
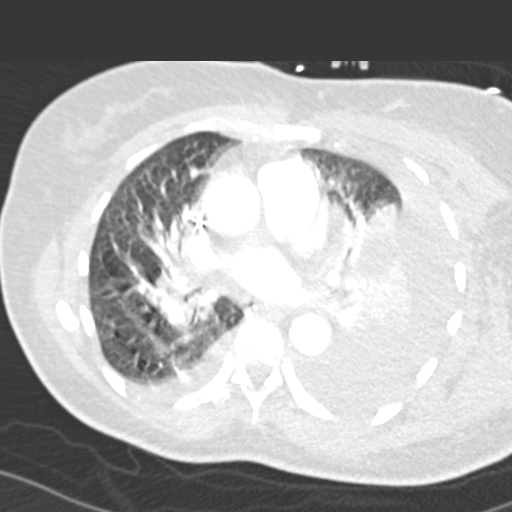
[im 171/281  soft-tissue]
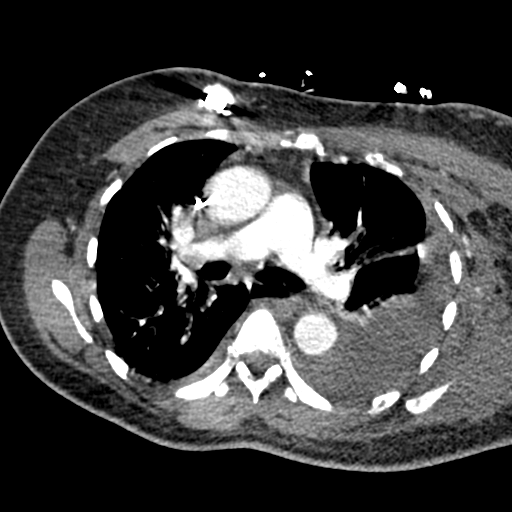
[im 183/281  lung]
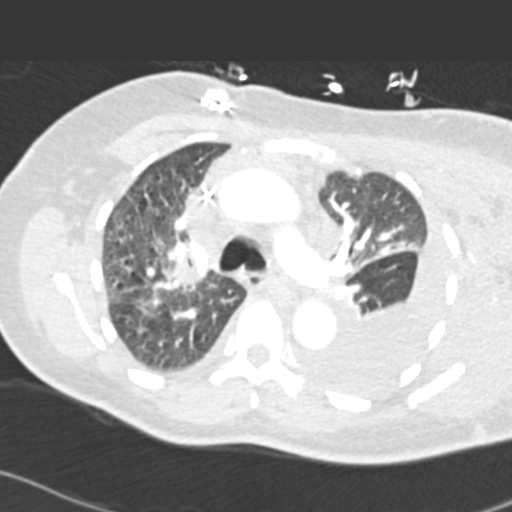
[im 195/281  soft-tissue]
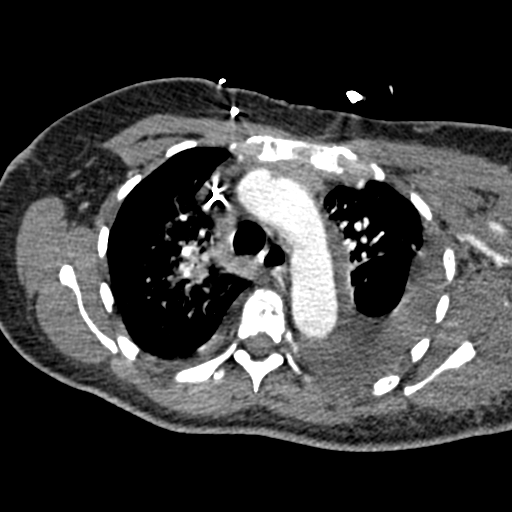
[im 220/281  lung]
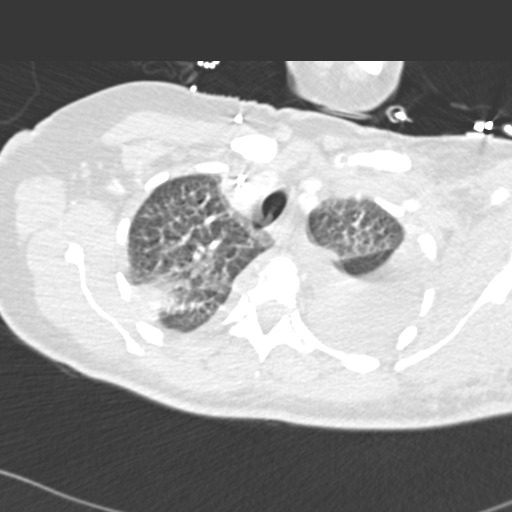
[im 232/281  soft-tissue]
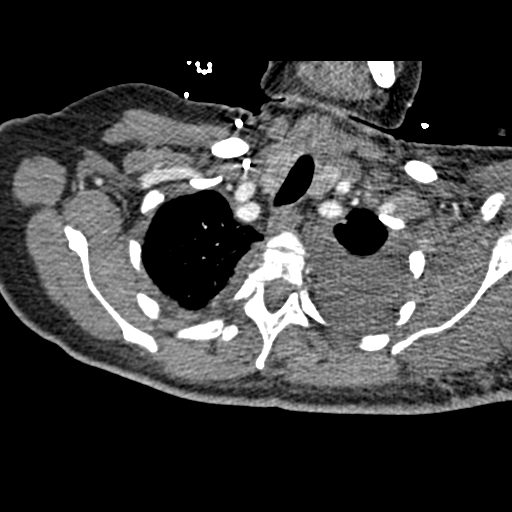
[im 244/281  lung]
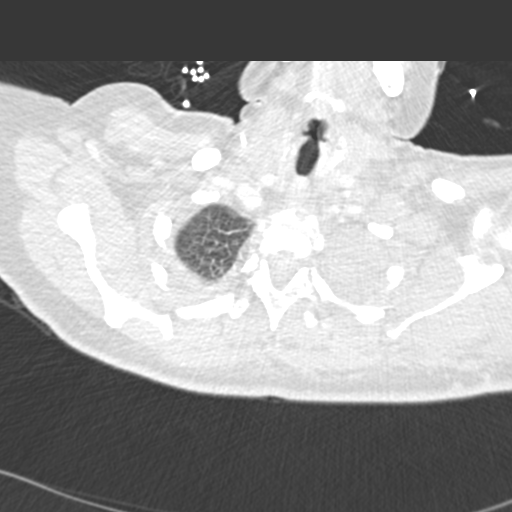
[im 268/281  soft-tissue]
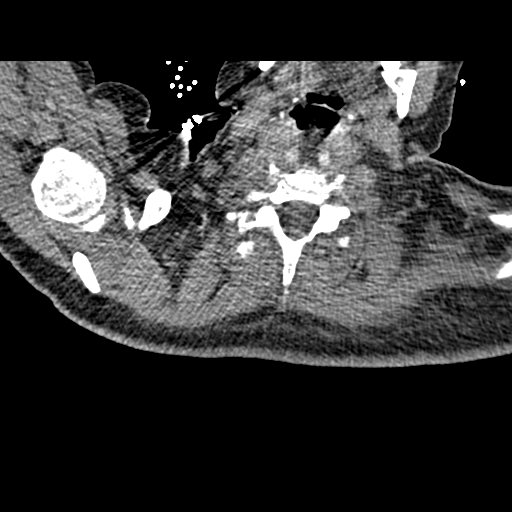

[Series 8: coronal mpr · coronal · 0.55mm/px · 2 of 76 slices shown]
[im 26/76  soft-tissue]
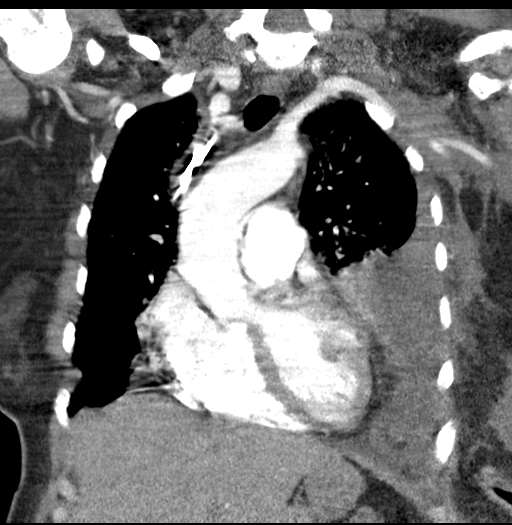
[im 51/76  soft-tissue]
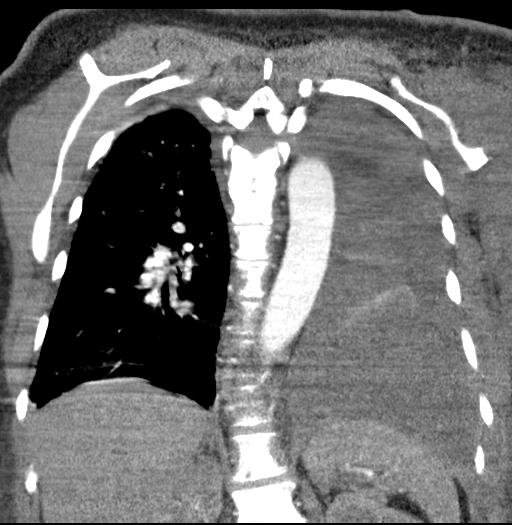

[18 of 46 positions shown; findings below may reference images not displayed]

FINDINGS: Cardiovascular: Good contrast bolus timing in the pulmonary arterial
tree. Intermittent respiratory motion. No central or left lung
pulmonary embolus. Enhancing compressed left lower lung. Distal
right lung branches are obscured by motion, but no convincing
pulmonary artery filling defect is identified.

Stable and normal cardiac size. No pericardial effusion. Negative
visible aorta. Right IJ approach Port-A-Cath now in place.

Mediastinum/Nodes: No mediastinal or hilar lymphadenopathy
identified.

Lungs/Pleura: Large layering and sub pulmonic left pleural effusion
with simple fluid density has significantly increased since the
thoracentesis on [DATE]. But no areas of left pleural nodularity
or mass are identified.

There is a new trace right pleural effusion.

There is increasing peribronchial and confluent right upper lung
opacity. Patchy opacity was present here in [REDACTED]. This has an
inflammatory appearance. Major airways are patent aside from
atelectatic changes and respiratory motion. There is mild diffuse
pulmonary septal thickening.

Upper Abdomen: Negative visible liver, spleen, pancreas, adrenal
glands, kidneys and bowel in the upper abdomen.

Musculoskeletal: Diffuse osseous metastatic disease in the visible
skeleton. Osteolysis and pathologic fracture of the posterior left
1st rib has increased since [REDACTED].

Review of the MIP images confirms the above findings.
IMPRESSION: 1. No acute pulmonary embolus identified. Distal right lung
pulmonary branches are degraded by motion.

2. Large layering and subpulmonic left pleural effusion with
significant increase since the thoracentesis on [DATE]. Left
lung compressive atelectasis. No left pleural metastasis identified.

3. Increased dependent right upper lung opacity since [REDACTED] which
appears inflammatory. Consider pneumonia, aspiration.

4. Mild pulmonary interstitial edema suspected. Trace new right
pleural effusion.

5. Diffuse osseous metastatic disease with some progression since
[REDACTED], including increased lysis and pathologic fracture of the
posterior left 1st rib.

## 2019-12-19 MED ORDER — CIPROFLOXACIN HCL 500 MG PO TABS: 500.0000 mg | ORAL_TABLET | Freq: Two times a day (BID) | ORAL | 0 refills | Status: DC

## 2019-12-19 MED ORDER — SODIUM CHLORIDE 0.9% FLUSH
10.0000 mL | Freq: Once | INTRAVENOUS | Status: AC
  Administered 2019-12-19: 09:00:00 10 mL
  Filled 2019-12-19: qty 10

## 2019-12-19 MED ORDER — ACETAMINOPHEN 325 MG PO TABS
650.0000 mg | ORAL_TABLET | Freq: Four times a day (QID) | ORAL | Status: DC | PRN
  Administered 2019-12-20 – 2019-12-21 (×2): 650 mg via ORAL
  Filled 2019-12-19 (×2): qty 2

## 2019-12-19 MED ORDER — VANCOMYCIN HCL 1500 MG/300ML IV SOLN
1500.0000 mg | Freq: Once | INTRAVENOUS | Status: AC
  Administered 2019-12-19: 1500 mg via INTRAVENOUS
  Filled 2019-12-19: qty 300

## 2019-12-19 MED ORDER — IOHEXOL 350 MG/ML SOLN
100.0000 mL | Freq: Once | INTRAVENOUS | Status: AC | PRN
  Administered 2019-12-19: 100 mL via INTRAVENOUS

## 2019-12-19 MED ORDER — ACETAMINOPHEN 650 MG RE SUPP: 650.0000 mg | Freq: Four times a day (QID) | RECTAL | Status: DC | PRN

## 2019-12-19 MED ORDER — SODIUM CHLORIDE 0.9% FLUSH: 3.0000 mL | Freq: Once | INTRAVENOUS | Status: DC

## 2019-12-19 MED ORDER — ONDANSETRON HCL 4 MG PO TABS: 4.0000 mg | ORAL_TABLET | Freq: Four times a day (QID) | ORAL | Status: DC | PRN

## 2019-12-19 MED ORDER — HEPARIN SOD (PORK) LOCK FLUSH 100 UNIT/ML IV SOLN
500.0000 [IU] | Freq: Every day | INTRAVENOUS | Status: AC | PRN
  Administered 2019-12-19: 500 [IU]
  Filled 2019-12-19: qty 5

## 2019-12-19 MED ORDER — SODIUM CHLORIDE (PF) 0.9 % IJ SOLN
INTRAMUSCULAR | Status: AC
  Filled 2019-12-19: qty 50

## 2019-12-19 MED ORDER — SODIUM CHLORIDE 0.9% FLUSH
10.0000 mL | INTRAVENOUS | Status: AC | PRN
  Administered 2019-12-19: 10 mL
  Filled 2019-12-19: qty 10

## 2019-12-19 MED ORDER — SODIUM CHLORIDE 0.9 % IV SOLN
2.0000 g | Freq: Three times a day (TID) | INTRAVENOUS | Status: DC
  Administered 2019-12-20 – 2019-12-24 (×13): 2 g via INTRAVENOUS
  Filled 2019-12-19 (×15): qty 2

## 2019-12-19 MED ORDER — SODIUM CHLORIDE 0.9 % IV SOLN
2.0000 g | Freq: Once | INTRAVENOUS | Status: AC
  Administered 2019-12-19: 2 g via INTRAVENOUS
  Filled 2019-12-19: qty 2

## 2019-12-19 MED ORDER — ONDANSETRON HCL 4 MG/2ML IJ SOLN
4.0000 mg | Freq: Four times a day (QID) | INTRAMUSCULAR | Status: DC | PRN
  Administered 2019-12-23: 4 mg via INTRAVENOUS
  Filled 2019-12-19: qty 2

## 2019-12-19 MED ORDER — SODIUM CHLORIDE 0.9% IV SOLUTION
250.0000 mL | Freq: Once | INTRAVENOUS | Status: AC
  Administered 2019-12-19: 250 mL via INTRAVENOUS
  Filled 2019-12-19: qty 250

## 2019-12-19 MED ORDER — HEPARIN SODIUM (PORCINE) 5000 UNIT/ML IJ SOLN
5000.0000 [IU] | Freq: Three times a day (TID) | INTRAMUSCULAR | Status: DC
  Filled 2019-12-19 (×2): qty 1

## 2019-12-19 NOTE — Progress Notes (Signed)
1355 Patient c/o slight nausea and difficulty breathing. Blood stopped and vitals obtained. Notified Ned Card, NP of current condition. VSS. Supplemental oxygen applied for comfort at 2L per minute. Patient re-evaluated by Ned Card, NP in infusion. Blood restarted at 1413 after being paused x 15+ minutes per NP request. No current s/s or c/o distress noted.

## 2019-12-19 NOTE — Progress Notes (Signed)
CRITICAL VALUE STICKER  CRITICAL VALUE: WBC 0.6  RECEIVER (on-site recipient of call): Manuela Schwartz, [place] NOTIFIED: 12/19/19 @ 0920  MESSENGER (representative from lab): Jasmin, LPN MD NOTIFIED: Ned Card, NP  TIME OF NOTIFICATION:0921  RESPONSE: Already had udenyca after tx. Being seen in office today

## 2019-12-19 NOTE — H&P (Signed)
History and Physical   Abigail [name] [id number] DOB: Jun 14, 1966 DOA: 12/19/2019  Referring MD/NP/PA: Dr. Bobby Rumpf  PCP: Hoyt Koch, MD   Outpatient Specialists: Dr. Donneta Romberg  Patient coming from: Home  Chief Complaint: Weakness and shortness of breath  HPI: Abigail [name] is a 54 y.o. female with medical history significant of metastatic breast cancer status post previous leftt-sided thoracentesis for pleural effusion, hyperlipidemia, allergic rhinitis, who presented here from the cancer center where she was seen today.  Patient found to have worsening shortness of breath and cough.  She was hypoxic and was found to be neutropenic with a total white count of 0.6.  ANC of 0.1.  Patient was also found to have worsening right-sided pleural effusion.  She was initiated on antibiotics and is being admitted to the hospital for treatment.  She had her last chemotherapy only last week.  Patient has low-grade temperature and feels weak.  She has left arm swelling which is chronic but not completely gone.  She is being admitted to the hospital for continued treatment.  Some cough but nonproductive.  Generalized weakness with any little activity...  ED Course: Temperature 97.2 blood pressure 145/93 pulse 105 respirate 24 and oxygen sat 85% on room air.  100% on 2 L.  White count is 0.7 hemoglobin 10.3 and platelets 135.  Chemistry is otherwise within normal.  Is negative lactic acid 1.1.  CT angiogram of the chest showed no PE but left pleural effusion with significant increase since thoracentesis about 8 days ago with left lung compressive atelectasis.  There was increased dependent right upper lung opacity which appears also inflammatory.  Review of Systems: As per HPI otherwise 10 point review of systems negative.    Past Medical History:  Diagnosis Date  . Allergic rhinitis   . Hyperlipidemia    LDL 150's '11    Past Surgical History:  Procedure Laterality Date  . CESAREAN  SECTION    . IR IMAGING GUIDED PORT INSERTION  11/07/2019  . IR THORACENTESIS ASP PLEURAL SPACE W/IMG GUIDE  11/09/2019  . IR US GUIDE BX ASP/DRAIN  10/29/2019     reports that she has never smoked. She has never used smokeless tobacco. She reports current alcohol use. She reports that she does not use drugs.  Allergies  Allergen Reactions  . Shellfish Allergy Itching and Swelling  . Sulfamethoxazole     REACTION: swelling    Family History  Problem Relation Age of Onset  . Diabetes Mother   . Hypertension Mother   . Hypertension Father   . Hypertension Sister   . Heart disease Sister        MI - no serious damage to heart  . Cancer Neg Hx      Prior to Admission medications   Medication Sig Start Date End Date Taking? Authorizing Provider  enoxaparin (LOVENOX) 120 MG/0.8ML injection Inject 0.73 mLs (110 mg total) into the skin daily. 11/08/19  Yes Owens Shark, NP  guaifenesin (ROBITUSSIN) 100 MG/5ML syrup Take 200 mg by mouth 3 (three) times daily as needed for cough.   Yes [provider]  HYDROcodone-acetaminophen (NORCO/VICODIN) 5-325 MG tablet Take 1 tablet by mouth every 8 (eight) hours as needed for moderate pain or severe pain. 12/04/19  Yes Owens Shark, NP  lidocaine-prilocaine (EMLA) cream Apply 1 application topically as directed. Apply to port 1 hour prior to stick and cover with plastic wrap 11/02/19  Yes Ladell Pier, MD  ondansetron [facility]) 8  MG tablet Take 1 tablet (8 mg total) by mouth every 8 (eight) hours as needed for nausea or vomiting. 11/02/19  Yes Ladell Pier, MD  potassium chloride SA (KLOR-CON) 20 MEQ tablet Take 1 tablet (20 mEq total) by mouth daily. 12/12/19  Yes Ladell Pier, MD  ciprofloxacin (CIPRO) 500 MG tablet Take 1 tablet (500 mg total) by mouth 2 (two) times daily for 7 days. 12/19/19 12/26/19  Owens Shark, NP  methocarbamol (ROBAXIN) 500 MG tablet Take 500 mg by mouth 4 (four) times daily. 10/18/19   [provider]  prochlorperazine (COMPAZINE) 10 MG tablet Take 1 tablet (10 mg total) by mouth every 6 (six) hours as needed for nausea. Patient not taking: Reported on 12/19/2019 11/02/19   Ladell Pier, MD    Physical Exam: Vitals:   12/19/19 1810 12/19/19 1815 12/19/19 2045 12/19/19 2134  BP: (!) 141/103 (!) 143/90 113/82 133/90  Pulse: (!) 105 95 92 93  Resp: (!) 23 (!) 24 19 (!) 21  Temp:      TempSrc:      SpO2: 97% 100% 100% 100%      Constitutional: Acutely ill looking Vitals:   12/19/19 1810 12/19/19 1815 12/19/19 2045 12/19/19 2134  BP: (!) 141/103 (!) 143/90 113/82 133/90  Pulse: (!) 105 95 92 93  Resp: (!) 23 (!) 24 19 (!) 21  Temp:      TempSrc:      SpO2: 97% 100% 100% 100%   Eyes: PERRL, lids and conjunctivae normal ENMT: Mucous membranes are moist. Posterior pharynx clear of any exudate or lesions.Normal dentition.  Neck: normal, supple, no masses, no thyromegaly Respiratory: Decreased air entry left more than right, increased crackles, no significant wheeze, no accessory muscle use.  Cardiovascular: Sinus tachycardia, no murmurs / rubs / gallops. No extremity edema. 2+ pedal pulses. No carotid bruits.  Abdomen: no tenderness, no masses palpated. No hepatosplenomegaly. Bowel sounds positive.  Musculoskeletal: no clubbing / cyanosis. No joint deformity upper and lower extremities. Good ROM, no contractures.  Left upper extremity swollen and warm skin: no rashes, lesions, ulcers. No induration Neurologic: CN 2-12 grossly intact. Sensation intact, DTR normal. Strength 5/5 in all 4.  Psychiatric: Normal judgment and insight. Alert and oriented x 3. Normal mood.     Labs on Admission: I have personally reviewed following labs and imaging studies  CBC: Recent Labs  Lab 12/19/19 0851 12/19/19 1702  WBC 0.6* 0.7*  NEUTROABS 0.1*  --   HGB 7.2* 10.3*  HCT 24.4* 33.5*  MCV 91.4 91.0  PLT 119* [id number]*   Basic Metabolic Panel: Recent Labs  Lab 12/19/19 1702  NA 140    K 3.9  CL 104  CO2 24  GLUCOSE 92  BUN 9  CREATININE 0.36*  CALCIUM 8.6*   GFR: Estimated Creatinine Clearance: 75.6 mL/min (A) (by C-G formula based on SCr of 0.36 mg/dL (L)). Liver Function Tests: No results for input(s): AST, ALT, ALKPHOS, BILITOT, PROT, ALBUMIN in the last 168 hours. No results for input(s): LIPASE, AMYLASE in the last 168 hours. No results for input(s): AMMONIA in the last 168 hours. Coagulation Profile: No results for input(s): INR, PROTIME in the last 168 hours. Cardiac Enzymes: No results for input(s): CKTOTAL, CKMB, CKMBINDEX, TROPONINI in the last 168 hours. BNP (last 3 results) No results for input(s): PROBNP in the last 8760 hours. HbA1C: No results for input(s): HGBA1C in the last 72 hours. CBG: No results for input(s): GLUCAP in the  last 168 hours. Lipid Profile: No results for input(s): CHOL, HDL, LDLCALC, TRIG, CHOLHDL, LDLDIRECT in the last 72 hours. Thyroid Function Tests: No results for input(s): TSH, T4TOTAL, FREET4, T3FREE, THYROIDAB in the last 72 hours. Anemia Panel: No results for input(s): VITAMINB12, FOLATE, FERRITIN, TIBC, IRON, RETICCTPCT in the last 72 hours. Urine analysis:    Component Value Date/Time   COLORURINE LT. YELLOW 08/04/2011 0805   APPEARANCEUR Cloudy 08/04/2011 0805   LABSPEC 1.020 08/04/2011 0805   PHURINE 6.0 08/04/2011 0805   GLUCOSEU NEGATIVE 08/04/2011 0805   HGBUR LARGE 08/04/2011 0805   BILIRUBINUR NEGATIVE 08/04/2011 0805   KETONESUR NEGATIVE 08/04/2011 0805   UROBILINOGEN 0.2 08/04/2011 0805   NITRITE NEGATIVE 08/04/2011 0805   LEUKOCYTESUR NEGATIVE 08/04/2011 0805   Sepsis Labs: @LABRCNTIP (procalcitonin:4,lacticidven:4) ) Recent Results (from the past 240 hour(s))  Resp Panel by RT PCR (RSV, Flu A&B, Covid) -     Status: None   Collection Time: 12/11/19 12:15 PM  Result Value Ref Range Status   SARS Coronavirus 2 by RT PCR NEGATIVE NEGATIVE Final    Comment: (NOTE) SARS-CoV-2 target nucleic  acids are NOT DETECTED. The SARS-CoV-2 RNA is generally detectable in upper respiratoy specimens during the acute phase of infection. The lowest concentration of SARS-CoV-2 viral copies this assay can detect is 131 copies/mL. A negative result does not preclude SARS-Cov-2 infection and should not be used as the sole basis for treatment or other patient management decisions. A negative result may occur with  improper specimen collection/handling, submission of specimen other than nasopharyngeal swab, presence of viral mutation(s) within the areas targeted by this assay, and inadequate number of viral copies (<131 copies/mL). A negative result must be combined with clinical observations, patient history, and epidemiological information. The expected result is Negative. Fact Sheet for Patients:  PinkCheek.be Fact Sheet for Healthcare Providers:  GravelBags.it This test is not yet ap proved or cleared by the [place] FDA and  has been authorized for detection and/or diagnosis of SARS-CoV-2 by FDA under an Emergency Use Authorization (EUA). This EUA will remain  in effect (meaning this test can be used) for the duration of the COVID-19 declaration under Section 564(b)(1) of the Act, 21 U.S.C. section 360bbb-3(b)(1), unless the authorization is terminated or revoked sooner.    Influenza A by PCR NEGATIVE NEGATIVE Final   Influenza B by PCR NEGATIVE NEGATIVE Final    Comment: (NOTE) The Xpert Xpress SARS-CoV-2/FLU/RSV assay is intended as an aid in  the diagnosis of influenza from Nasopharyngeal swab specimens and  should not be used as a sole basis for treatment. Nasal washings and  aspirates are unacceptable for Xpert Xpress SARS-CoV-2/FLU/RSV  testing. Fact Sheet for Patients: PinkCheek.be Fact Sheet for Healthcare Providers: GravelBags.it This test is not yet  approved or cleared by the [place] FDA and  has been authorized for detection and/or diagnosis of SARS-CoV-2 by  FDA under an Emergency Use Authorization (EUA). This EUA will remain  in effect (meaning this test can be used) for the duration of the  Covid-19 declaration under Section 564(b)(1) of the Act, 21  U.S.C. section 360bbb-3(b)(1), unless the authorization is  terminated or revoked.    Respiratory Syncytial Virus by PCR NEGATIVE NEGATIVE Final    Comment: (NOTE) Fact Sheet for Patients: PinkCheek.be Fact Sheet for Healthcare Providers: GravelBags.it This test is not yet approved or cleared by the [place] FDA and  has been authorized for detection and/or diagnosis of SARS-CoV-2 by  FDA under an Emergency  Use Authorization (EUA). This EUA will remain  in effect (meaning this test can be used) for the duration of the  COVID-19 declaration under Section 564(b)(1) of the Act, 21 U.S.C.  section 360bbb-3(b)(1), unless the authorization is terminated or  revoked. Performed at [facility], [place] [address] [place], [place] [address]      Radiological Exams on Admission: DG Chest 2 View  Result Date: 12/19/2019 CLINICAL DATA:  Dyspnea. EXAM: CHEST - 2 VIEW COMPARISON:  March 2nd 2021 FINDINGS: Redemonstration of a right internal jugular central vascular catheter port, its tip projecting on the superior vena cavoatrial junction. Similar lung inflation and subtle interstitial edema with worsened pleural fluid and dense consolidation on the left that now obscures the left heart border and continues to obscure the left hemidiaphragm. Spinal dextrocurvature, mild skeletal degenerative change, and aortic knob calcified atherosclerosis are redemonstrated. IMPRESSION: Interval worsened left pleural effusion and consolidation, possibly malignant or bland pleural effusion with compressive atelectasis versus a  left pneumonia. Stable central catheter port placement and similar persistent interstitial edema. Aortic calcified atherosclerosis. Electronically Signed   By: Revonda Humphrey   On: 12/19/2019 17:52   CT Angio Chest PE W/Cm &/Or Wo Cm  Result Date: 12/19/2019 CLINICAL DATA:  54 year old female with increased shortness of breath. Metastatic breast cancer. Recent ultrasound-guided left side thoracentesis. EXAM: CT ANGIOGRAPHY CHEST WITH CONTRAST TECHNIQUE: Multidetector CT imaging of the chest was performed using the standard protocol during bolus administration of intravenous contrast. Multiplanar CT image reconstructions and MIPs were obtained to evaluate the vascular anatomy. CONTRAST:  154mL OMNIPAQUE IOHEXOL 350 MG/ML SOLN COMPARISON:  Post thoracentesis chest radiograph 12/11/2019. Two-view chest radiographs earlier today. CTA chest 10/25/2019. FINDINGS: Cardiovascular: Good contrast bolus timing in the pulmonary arterial tree. Intermittent respiratory motion. No central or left lung pulmonary embolus. Enhancing compressed left lower lung. Distal right lung branches are obscured by motion, but no convincing pulmonary artery filling defect is identified. Stable and normal cardiac size. No pericardial effusion. Negative visible aorta. Right IJ approach Port-A-Cath now in place. Mediastinum/Nodes: No mediastinal or hilar lymphadenopathy identified. Lungs/Pleura: Large layering and sub pulmonic left pleural effusion with simple fluid density has significantly increased since the thoracentesis on 12/11/2019. But no areas of left pleural nodularity or mass are identified. There is a new trace right pleural effusion. There is increasing peribronchial and confluent right upper lung opacity. Patchy opacity was present here in January. This has an inflammatory appearance. Major airways are patent aside from atelectatic changes and respiratory motion. There is mild diffuse pulmonary septal thickening. Upper Abdomen:  Negative visible liver, spleen, pancreas, adrenal glands, kidneys and bowel in the upper abdomen. Musculoskeletal: Diffuse osseous metastatic disease in the visible skeleton. Osteolysis and pathologic fracture of the posterior left 1st rib has increased since January. Review of the MIP images confirms the above findings. IMPRESSION: 1. No acute pulmonary embolus identified. Distal right lung pulmonary branches are degraded by motion. 2. Large layering and subpulmonic left pleural effusion with significant increase since the thoracentesis on 12/11/2019. Left lung compressive atelectasis. No left pleural metastasis identified. 3. Increased dependent right upper lung opacity since January which appears inflammatory. Consider pneumonia, aspiration. 4. Mild pulmonary interstitial edema suspected. Trace new right pleural effusion. 5. Diffuse osseous metastatic disease with some progression since January, including increased lysis and pathologic fracture of the posterior left 1st rib. Electronically Signed   By: Genevie Ann M.D.   On: 12/19/2019 19:40    EKG: Independently reviewed.  It  shows sinus tachycardia with low voltage EKG and nonspecific T wave changes  Assessment/Plan Principal Problem:   Acute respiratory failure with hypoxia (HCC) Active Problems:   Borderline hyperlipidemia   HCAP (healthcare-associated pneumonia)   Breast cancer (HCC)     #1 acute respiratory failure with hypoxia: Secondary to left-sided pleural effusion and healthcare associated pneumonia on CT.  Patient being admitted with IV antibiotics.  Treated with thoracentesis again.  Continue treatment per oncology.  #2 Healthcare associated pneumonia: We will treat patient's pneumonia with IV vancomycin and cefepime.  Continue oxygen.  Obtain blood cultures and follow  #3 pancytopenia: Secondary to recent chemotherapy.  Neutropenic precaution.  #4 metastatic breast cancer: Continue per oncology  #5 hyperlipidemia: Continue  statin  #6 left-sided pleural effusion: Most likely malignant effusion.  Will order repeat thoracentesis   DVT prophylaxis: Heparin Code Status: Full code Family Communication: Discussed fully with the Disposition Plan: Home Consults called: Oncology in the morning Admission status: Inpatient  Severity of Illness: The appropriate patient status for this patient is INPATIENT. Inpatient status is judged to be reasonable and necessary in order to provide the required intensity of service to ensure the patient's safety. The patient's presenting symptoms, physical exam findings, and initial radiographic and laboratory data in the context of their chronic comorbidities is felt to place them at high risk for further clinical deterioration. Furthermore, it is not anticipated that the patient will be medically stable for discharge from the hospital within 2 midnights of admission. The following factors support the patient status of inpatient.   " The patient's presenting symptoms include shortness of breath. " The worrisome physical exam findings include decreased air entry on the left. " The initial radiographic and laboratory data are worrisome because of evidence of worsening pleural if. " The chronic co-morbidities include metastatic breast cancer.   * I certify that at the point of admission it is my clinical judgment that the patient will require inpatient hospital care spanning beyond 2 midnights from the point of admission due to high intensity of service, high risk for further deterioration and high frequency of surveillance required.Barbette Merino MD [facility] Pager [phone]  If 7PM-7AM, please contact night-coverage [email or web address] Password [facility]  12/19/2019, 9:42 PM

## 2019-12-19 NOTE — ED Notes (Signed)
Date and time results received: 12/19/19 5:35 PM  (use smartphrase ".now" to insert current time)  Test: WBC Critical Value: 0.7  Name of Provider Notified: Zackowski  Orders Received? Or Actions Taken?: Orders Received - See Orders for details

## 2019-12-19 NOTE — ED Triage Notes (Signed)
Per Cancer Center RN: Patient presents to the ED today from cancer center for SOB. Patient reports she becomes more SOB when she stops walking but cannot walk far. Patient alert and oriented.

## 2019-12-19 NOTE — Patient Instructions (Signed)

## 2019-12-19 NOTE — Progress Notes (Addendum)
[facility] OFFICE PROGRESS NOTE   Diagnosis: Breast cancer  INTERVAL HISTORY:   Abigail [name] returns as scheduled.  She completed cycle 1 Adriamycin/Cytoxan 12/12/2019.  She feels she tolerated the first cycle of chemotherapy well.  She had minimal nausea and mild diarrhea.  No mouth sores.  Left arm is more mobile.  Skin nodules have improved.  She denies bleeding.  She describes her appetite as "pretty good".  No fever.  She has a mild cough and mild shortness of breath.  Objective:  Vital signs in last 24 hours:  Blood pressure 121/75, pulse (!) 102, temperature (!) 97.2 F (36.2 C), temperature source Temporal, resp. rate 17, height '5\' 1"'  (1.549 m), weight 166 lb 8 oz (75.5 kg), SpO2 98 %.    HEENT: Mild white coating over tongue.  No buccal thrush. Resp: Breath sounds diminished left lung field.  No respiratory distress.  Cardio: Regular rate and rhythm. GI: Abdomen soft and nontender.  No hepatomegaly. Vascular: Pitting edema at the lower legs bilaterally.  Edema throughout the left arm and hand.  Skin: Improved nodularity at the left pectoral fold and left axilla.  Persistent skin thickening at the left upper anterior chest and back. Port-A-Cath without erythema.  Lab Results:  Lab Results  Component Value Date   WBC 0.6 (LL) 12/19/2019   HGB 7.2 (L) 12/19/2019   HCT 24.4 (L) 12/19/2019   MCV 91.4 12/19/2019   PLT 119 (L) 12/19/2019   NEUTROABS PENDING 12/19/2019    Imaging:  No results found.  Medications: I have reviewed the patient's current medications.  Assessment/Plan: 1. Metastatic breast cancer  CT chest 10/25/2019-left breast mass left axillary lymph nodes, abnormal appearance of the left concerning for tumor involvement, mediastinal adenopathy, diffuse sclerotic and lytic lesions, left pleural effusion  CT abdomen/pelvis 10/26/2019 enhancing left breast mass with left axillary lymphadenopathy and tumor involved the left chest wall the  latissimus dorsi segment 4B liver lesion, diffuse lytic/sclerotic lesions  Ultrasound-guided biopsy of left liver lesion 10/29/1999-metastatic carcinoma, cytokeratin andGATA3positive consistent with metastatic breast cancer. HER-2 negative, ER negative, PR negative, Ki-6720%. PD-L1 <1% expression   [facility] hereditary panel negative  Initiated systemic chemotherapy weekly abraxane 100 mg/m2 days 1, 8, 15 q28 days   Cycle 1 day 1 on 11/14/19  Cycle 1 day 8 on 11/21/19 (hg 7.5, receive 1 unit RBCs)  Cycle 1 day 15 on 11/28/2019  Cycle 1 Adriamycin/Cytoxan 12/12/2019 2. Left upper extremity DVT-left subclavian, axillary, and brachial vein thrombosis confirmed on  Anticoagulation with apixaban, changed to parenteral hospital admission1/14/2021transition to twice daily Lovenox at discharge; transition to once daily 11/08/2019. 3.Anemia-likely secondary to metastatic breast cancer involving the bone marrow 4.Left pleural effusion-thoracentesis 11/09/2019, cytology positive for malignant cells 5.Port-A-Cath placement1/27/2021, interventional radiology 6.2D echo 11/08/2019-ejection fraction 55 to 60%, no pericardial effusion 7.Tachycardia-likely multifactorial secondary to cancer burden, anemia, pleural effusion-improved 8.  Neutropenia secondary to chemotherapy 12/19/2019.  Prophylactic ciprofloxacin initiated.  Disposition: Ms. Noori appears stable.  She has completed 1 cycle of Adriamycin/Cytoxan.  Skin nodularity has improved.   We reviewed the CBC from today.  She has neutropenia.  She did receive white cell growth factor support.  We reviewed neutropenic precautions.  She understands contact the office with fever, chills, other signs of infection.  She will begin prophylactic ciprofloxacin.  She has mild thrombocytopenia and understands to contact the office with any bleeding.  The hemoglobin is lower.  She will receive a blood transfusion today.  She will return for  a repeat  CBC on 12/21/2019.  We will repeat a chest x-ray in 1 week to follow-up the pleural effusion.  She will return for lab and a follow-up visit on 12/26/2019.  She will contact the office in the interim as outlined above or with any other problems.  Patient seen with Dr. Benay Spice.    Ned Card ANP/GNP-BC   12/19/2019  9:59 AM  This was a shared visit with Ned Card.  Ms. Mace was interviewed and examined.  She is now at day 8 following cycle 1 Adriamycin/Cytoxan.  Her clinical status has improved.  She has developed severe neutropenia.  She will call for a fever.  She will begin ciprofloxacin prophylaxis.  She will receive packed red blood cells today.  [name], MD

## 2019-12-19 NOTE — ED Provider Notes (Signed)
[facility] DEPT Provider Note   CSN: [id number] Arrival date & time: 12/19/19  [id number]     History Chief Complaint  Patient presents with  . Shortness of Breath    Abigail [name] is a 54 y.o. female.  Patient sent over from the cancer center.  For acute shortness of breath.  Patient states that shortness of breath is somewhat exertional.  Gets very short of breath with any walking any distances.  But gets worse when she stops.  Patient at the beginning of the month had a significant pleural effusion on the left side which was drained.  Patient has a Port-A-Cath.  Patient's been undergoing infusion therapy.  Patient known to have metastatic breast CA.  Patient more comfortable on 2 L of oxygen.  There was a room air sat recorded to 86%.  On 2 L of oxygen patient satting 92 to 96%.        Past Medical History:  Diagnosis Date  . Allergic rhinitis   . Hyperlipidemia    LDL 150's '11    Patient Active Problem List   Diagnosis Date Noted  . Acute respiratory failure with hypoxia ([place]) 12/19/2019  . Port-A-Cath in place 11/14/2019  . Goals of care, counseling/discussion 11/02/2019  . Breast cancer ([place]) 11/02/2019  . HCAP (healthcare-associated pneumonia) 10/25/2019  . 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 Breast mass, left 10/25/2019  . DVT (deep vein thrombosis) in pregnancy 10/25/2019  . Hypokalemia 10/25/2019  . Acute deep vein thrombosis (DVT) of axillary vein of left upper extremity ([place]) 10/19/2019  . Left arm pain 09/10/2019  . Routine health maintenance 08/04/2011  . Borderline hyperlipidemia 06/05/2010    Past Surgical History:  Procedure Laterality Date  . CESAREAN SECTION    . IR IMAGING GUIDED PORT INSERTION  11/07/2019  . IR PERC PLEURAL DRAIN W/INDWELL CATH W/IMG GUIDE  12/20/2019  . IR THORACENTESIS ASP PLEURAL SPACE W/IMG GUIDE  11/09/2019  . IR US GUIDE BX ASP/DRAIN  10/29/2019     OB History   No obstetric history on file.      Family History  Problem Relation Age of Onset  . Diabetes Mother   . Hypertension Mother   . Hypertension Father   . Hypertension Sister   . Heart disease Sister        MI - no serious damage to heart  . Cancer Neg Hx     Social History   Tobacco Use  . Smoking status: Never Smoker  . Smokeless tobacco: Never Used  Substance Use Topics  . Alcohol use: Yes    Comment: rare use - twice rare  . Drug use: No    Home Medications Prior to Admission medications   Medication Sig Start Date End Date Taking? Authorizing Provider  enoxaparin (LOVENOX) 120 MG/0.8ML injection Inject 0.73 mLs (110 mg total) into the skin daily. 11/08/19  Yes Owens Shark, NP  guaifenesin (ROBITUSSIN) 100 MG/5ML syrup Take 200 mg by mouth 3 (three) times daily as needed for cough.   Yes [provider]  HYDROcodone-acetaminophen (NORCO/VICODIN) 5-325 MG tablet Take 1 tablet by mouth every 8 (eight) hours as needed for moderate pain or severe pain. 12/04/19  Yes Owens Shark, NP  lidocaine-prilocaine (EMLA) cream Apply 1 application topically as directed. Apply to port 1 hour prior to stick and cover with plastic wrap 11/02/19  Yes Ladell Pier, MD  ondansetron (ZOFRAN) 8 MG tablet Take 1 tablet (8 mg total) by mouth  every 8 (eight) hours as needed for nausea or vomiting. 11/02/19  Yes Ladell Pier, MD  potassium chloride SA (KLOR-CON) 20 MEQ tablet Take 1 tablet (20 mEq total) by mouth daily. 12/12/19  Yes Ladell Pier, MD  ciprofloxacin (CIPRO) 500 MG tablet Take 1 tablet (500 mg total) by mouth 2 (two) times daily for 7 days. 12/19/19 12/26/19  Owens Shark, NP  methocarbamol (ROBAXIN) 500 MG tablet Take 500 mg by mouth 4 (four) times daily. 10/18/19   [provider]  prochlorperazine (COMPAZINE) 10 MG tablet Take 1 tablet (10 mg total) by mouth every 6 (six) hours as needed for nausea. Patient not taking: Reported on 12/19/2019 11/02/19   Ladell Pier, MD    Allergies     Shellfish allergy and Sulfamethoxazole  Review of Systems   Review of Systems  Constitutional: Negative for chills and fever.  HENT: Negative for congestion, rhinorrhea and sore throat.   Eyes: Negative for visual disturbance.  Respiratory: Positive for shortness of breath. Negative for cough.   Cardiovascular: Negative for chest pain and leg swelling.  Gastrointestinal: Negative for abdominal pain, diarrhea, nausea and vomiting.  Genitourinary: Negative for dysuria.  Musculoskeletal: Negative for back pain and neck pain.  Skin: Negative for rash.  Neurological: Negative for dizziness, light-headedness and headaches.  Hematological: Does not bruise/bleed easily.  Psychiatric/Behavioral: Negative for confusion.    Physical Exam Updated Vital Signs BP 122/79 (BP Location: Right Arm)   Pulse 89   Temp 97.9 F (36.6 C)   Resp 20   Ht 1.549 m (5\' 1" )   Wt 75.3 kg   SpO2 100%   BMI 31.37 kg/m   Physical Exam Vitals and nursing note reviewed.  Constitutional:      General: She is not in acute distress.    Appearance: Normal appearance. She is well-developed.  HENT:     Head: Normocephalic and atraumatic.  Eyes:     Conjunctiva/sclera: Conjunctivae normal.  Cardiovascular:     Rate and Rhythm: Normal rate and regular rhythm.     Heart sounds: No murmur.  Pulmonary:     Effort: Pulmonary effort is normal. No respiratory distress.     Breath sounds: Normal breath sounds.     Comments: Port-A-Cath anterior chest. Abdominal:     Palpations: Abdomen is soft.     Tenderness: There is no abdominal tenderness.  Musculoskeletal:        General: Normal range of motion.     Cervical back: Normal range of motion and neck supple.  Skin:    General: Skin is warm and dry.  Neurological:     General: No focal deficit present.     Mental Status: She is alert and oriented to person, place, and time. Mental status is at baseline.     ED Results / Procedures / Treatments    Labs (all labs ordered are listed, but only abnormal results are displayed) Labs Reviewed  BASIC METABOLIC PANEL - Abnormal; Notable for the following components:      Result Value   Creatinine, Ser 0.36 (*)    Calcium 8.6 (*)    All other components within normal limits  CBC - Abnormal; Notable for the following components:   WBC 0.7 (*)    RBC 3.68 (*)    Hemoglobin 10.3 (*)    HCT 33.5 (*)    RDW 16.3 (*)    Platelets 135 (*)    All other components within normal limits  COMPREHENSIVE METABOLIC PANEL - Abnormal; Notable for the following components:   Creatinine, Ser 0.38 (*)    Calcium 8.5 (*)    Total Protein 6.1 (*)    Albumin 2.7 (*)    Alkaline Phosphatase 205 (*)    All other components within normal limits  CBC - Abnormal; Notable for the following components:   WBC 0.7 (*)    RBC 3.48 (*)    Hemoglobin 9.9 (*)    HCT 31.9 (*)    RDW 16.4 (*)    Platelets 110 (*)    All other components within normal limits  SARS CORONAVIRUS 2 (TAT 6-24 HRS)  CULTURE, BLOOD (ROUTINE X 2)  CULTURE, BLOOD (ROUTINE X 2)  LACTIC ACID, PLASMA  BASIC METABOLIC PANEL  CBC WITH DIFFERENTIAL/PLATELET    EKG EKG Interpretation  Date/Time:  Wednesday December 19 2019 18:09:59 EST Ventricular Rate:  102 PR Interval:    QRS Duration: 74 QT Interval:  370 QTC Calculation: 482 R Axis:   47 Text Interpretation: Sinus tachycardia Consider left atrial enlargement Low voltage, precordial leads Borderline T abnormalities, diffuse leads Baseline wander in lead(s) I III aVR aVL aVF Abnormal ECG Confirmed by Carmin Muskrat [phone]) on 12/20/2019 9:44:19 AM   Radiology DG Chest 2 View  Result Date: 12/19/2019 CLINICAL DATA:  Dyspnea. EXAM: CHEST - 2 VIEW COMPARISON:  March 2nd 2021 FINDINGS: Redemonstration of a right internal jugular central vascular catheter port, its tip projecting on the superior vena cavoatrial junction. Similar lung inflation and subtle interstitial edema with worsened  pleural fluid and dense consolidation on the left that now obscures the left heart border and continues to obscure the left hemidiaphragm. Spinal dextrocurvature, mild skeletal degenerative change, and aortic knob calcified atherosclerosis are redemonstrated. IMPRESSION: Interval worsened left pleural effusion and consolidation, possibly malignant or bland pleural effusion with compressive atelectasis versus a left pneumonia. Stable central catheter port placement and similar persistent interstitial edema. Aortic calcified atherosclerosis. Electronically Signed   By: Revonda Humphrey   On: 12/19/2019 17:52   CT Angio Chest PE W/Cm &/Or Wo Cm  Result Date: 12/19/2019 CLINICAL DATA:  54 year old female with increased shortness of breath. Metastatic breast cancer. Recent ultrasound-guided left side thoracentesis. EXAM: CT ANGIOGRAPHY CHEST WITH CONTRAST TECHNIQUE: Multidetector CT imaging of the chest was performed using the standard protocol during bolus administration of intravenous contrast. Multiplanar CT image reconstructions and MIPs were obtained to evaluate the vascular anatomy. CONTRAST:  168mL OMNIPAQUE IOHEXOL 350 MG/ML SOLN COMPARISON:  Post thoracentesis chest radiograph 12/11/2019. Two-view chest radiographs earlier today. CTA chest 10/25/2019. FINDINGS: Cardiovascular: Good contrast bolus timing in the pulmonary arterial tree. Intermittent respiratory motion. No central or left lung pulmonary embolus. Enhancing compressed left lower lung. Distal right lung branches are obscured by motion, but no convincing pulmonary artery filling defect is identified. Stable and normal cardiac size. No pericardial effusion. Negative visible aorta. Right IJ approach Port-A-Cath now in place. Mediastinum/Nodes: No mediastinal or hilar lymphadenopathy identified. Lungs/Pleura: Large layering and sub pulmonic left pleural effusion with simple fluid density has significantly increased since the thoracentesis on 12/11/2019.  But no areas of left pleural nodularity or mass are identified. There is a new trace right pleural effusion. There is increasing peribronchial and confluent right upper lung opacity. Patchy opacity was present here in January. This has an inflammatory appearance. Major airways are patent aside from atelectatic changes and respiratory motion. There is mild diffuse pulmonary septal thickening. Upper Abdomen: Negative visible liver, spleen, pancreas, adrenal glands, kidneys  and bowel in the upper abdomen. Musculoskeletal: Diffuse osseous metastatic disease in the visible skeleton. Osteolysis and pathologic fracture of the posterior left 1st rib has increased since January. Review of the MIP images confirms the above findings. IMPRESSION: 1. No acute pulmonary embolus identified. Distal right lung pulmonary branches are degraded by motion. 2. Large layering and subpulmonic left pleural effusion with significant increase since the thoracentesis on 12/11/2019. Left lung compressive atelectasis. No left pleural metastasis identified. 3. Increased dependent right upper lung opacity since January which appears inflammatory. Consider pneumonia, aspiration. 4. Mild pulmonary interstitial edema suspected. Trace new right pleural effusion. 5. Diffuse osseous metastatic disease with some progression since January, including increased lysis and pathologic fracture of the posterior left 1st rib. Electronically Signed   By: Genevie Ann M.D.   On: 12/19/2019 19:40   IR PERC PLEURAL DRAIN W/INDWELL CATH W/IMG GUIDE  Result Date: 12/20/2019 CLINICAL DATA:  Recurrent malignant left pleural effusion from metastatic breast carcinoma. EXAM: INSERTION OF TUNNELED PLEURAL DRAINAGE CATHETER ANESTHESIA/SEDATION: 2.0 mg IV Versed; 100 mcg IV Fentanyl. Total Moderate Sedation Time 24 minutes. The patient's level of consciousness and physiologic status were continuously monitored during the procedure by Radiology nursing. MEDICATIONS: 2 g IV  Ancef. Antibiotic was administered in an appropriate time interval for the procedure. FLUOROSCOPY TIME:  24 seconds.  63 mGy. PROCEDURE: The procedure, risks, benefits, and alternatives were explained to the patient. Questions regarding the procedure were encouraged and answered. The patient understands and consents to the procedure. A time-out was performed prior to initiating the procedure. The left chest wall was prepped with chlorhexidine in a sterile fashion, and a sterile drape was applied covering the operative field. A sterile gown and sterile gloves were used for the procedure. Local anesthesia was provided with 1% Lidocaine. Ultrasound image documentation was performed. Fluoroscopy during the procedure and fluoroscopic spot radiograph confirms appropriate catheter position. After creating a small skin incision, a 19 gauge needle was advanced into the pleural cavity under ultrasound guidance. A guide wire was then advanced under fluoroscopy into the pleural space. Pleural access was dilated serially and a 16-French peel-away sheath placed. A 15.5 French tunneled PleurX catheter was placed. This was tunneled from an incision 5 cm below the pleural access to the access site. The catheter was advanced through the peel-away sheath. The sheath was then removed. Final catheter positioning was confirmed with a fluoroscopic spot image. The access incision was closed with subcuticular 4-0 Vicryl. Dermabond was applied to the incision. A Prolene retention suture was applied at the catheter exit site. Large volume thoracentesis was performed through the new catheter utilizing a vacuum bottle. COMPLICATIONS: None. FINDINGS: The catheter was placed via the left posterolateral chest wall. Approximately 1 L of bloody pleural fluid was able to be removed after catheter placement. IMPRESSION: Placement of tunneled left PleurX drainage catheter into the left pleural space. 1 L of bloody pleural fluid was removed today after  catheter placement. Electronically Signed   By: Aletta Edouard M.D.   On: 12/20/2019 14:04    Procedures Procedures (including critical care time) CRITICAL CARE Performed by: Fredia Sorrow Total critical care time: 30 minutes Critical care time was exclusive of separately billable procedures and treating other patients. Critical care was necessary to treat or prevent imminent or life-threatening deterioration. Critical care was time spent personally by me on the following activities: development of treatment plan with patient and/or surrogate as well as nursing, discussions with consultants, evaluation of patient's response to treatment,  examination of patient, obtaining history from patient or surrogate, ordering and performing treatments and interventions, ordering and review of laboratory studies, ordering and review of radiographic studies, pulse oximetry and re-evaluation of patient's condition.   Medications Ordered in ED Medications  sodium chloride flush (NS) 0.9 % injection 3 mL (has no administration in time range)  ondansetron (ZOFRAN) tablet 4 mg (has no administration in time range)    Or  ondansetron (ZOFRAN) injection 4 mg (has no administration in time range)  acetaminophen (TYLENOL) tablet 650 mg (650 mg Oral Given 12/20/19 1540)  ceFEPIme (MAXIPIME) 2 g in sodium chloride 0.9 % 100 mL IVPB (2 g Intravenous New Bag/Given 12/20/19 1420)  ipratropium-albuterol (DUONEB) 0.5-2.5 (3) MG/3ML nebulizer solution 3 mL (3 mLs Nebulization Given 12/20/19 1042)  guaiFENesin-dextromethorphan (ROBITUSSIN DM) 100-10 MG/5ML syrup 5 mL (has no administration in time range)  sodium chloride flush (NS) 0.9 % injection 10-40 mL (has no administration in time range)  Chlorhexidine Gluconate Cloth 2 % PADS 6 each (6 each Topical Given 12/20/19 1022)  lidocaine (XYLOCAINE) 1 % (with pres) injection (  Canceled Entry 12/20/19 1412)  enoxaparin (LOVENOX) injection 110 mg (has no administration in  time range)  HYDROcodone-acetaminophen (NORCO/VICODIN) 5-325 MG per tablet 1 tablet (1 tablet Oral Given 12/20/19 1353)  iohexol (OMNIPAQUE) 350 MG/ML injection 100 mL (100 mLs Intravenous Contrast Given 12/19/19 1906)  ceFEPIme (MAXIPIME) 2 g in sodium chloride 0.9 % 100 mL IVPB (0 g Intravenous Stopped 12/19/19 2059)  vancomycin (VANCOREADY) IVPB 1500 mg/300 mL (1,500 mg Intravenous New Bag/Given 12/19/19 2056)  ceFAZolin (ANCEF) IVPB 2g/100 mL premix (2 g Intravenous New Bag/Given 12/20/19 1217)  midazolam (VERSED) injection (1 mg Intravenous Given 12/20/19 1226)  fentaNYL (SUBLIMAZE) injection (50 mcg Intravenous Given 12/20/19 1226)  lidocaine (PF) (XYLOCAINE) 1 % injection (10 mLs  Given 12/20/19 1222)  LORazepam (ATIVAN) injection (1 mg Intravenous Canceled Entry 12/20/19 1226)    ED Course  I have reviewed the triage vital signs and the nursing notes.  Pertinent labs & imaging results that were available during my care of the patient were reviewed by me and considered in my medical decision making (see chart for details).    MDM Rules/Calculators/A&P                       Patient's work-up included a chest x-ray.  Then patient went on to have CTA of chest to rule out pulmonary embolus.  No evidence of pulmonary embolus.  But showed a worsening of the left pleural effusion.  And also raise concerns for pneumonia right upper lung area.  Patient when she came back from CT scanner was acutely short of breath from a laying short.  Got extremely anxious diaphoretic.  Oxygen sats were around 90%.  Patient placed on high percent nonrebreather she slowly recovered.  Oxygen sats came back up.  We left her on the echo she was more comfortable although not needed.  Patient started on antibiotics for the pneumonia.  Probably the main reason for the acute shortness of breath is the significantly large pleural effusion.  Will need to have that tapped.  It can be done electively.  Is but is need not to be  done emergently.  Patient was started on Maxipime and bank.  Hospitalist contacted they will admit.  Also updated on call hematology oncology and they will consult.    Final Clinical Impression(s) / ED Diagnoses Final diagnoses:  SOB (shortness of breath)  Pleural  effusion on left  HCAP (healthcare-associated pneumonia)  Malignant neoplasm of female breast, unspecified estrogen receptor status, unspecified laterality, unspecified site of breast ([place])    Rx / DC Orders ED Discharge Orders         Ordered    Ambulatory Pleural Drainage Schedule    Comments: Drain daily, up to max of 1L until patient is only able to drain out 145ml. If <123ml for 3 consecutive drains every other day, then call Interventional Radiology [phone]) for evaluation and possible removal.   12/20/19 1249           Fredia Sorrow, MD 12/20/19 1923

## 2019-12-19 NOTE — Progress Notes (Signed)
RAC IV infiltrated. Stopped maxipime. Notified MD. MD stated okay to finish IV maxipime after vancomycin was complete.

## 2019-12-19 NOTE — Telephone Encounter (Signed)
Received critical lab call for patient. White count 0.6  Merceda Elks RN made aware

## 2019-12-19 NOTE — Progress Notes (Signed)
ED TO INPATIENT HANDOFF REPORT  ED Nurse Name and Phone #: Seaver Machia [phone]  S Name/Age/Gender Abigail [name] 54 y.o. female Room/Bed: WA11/WA11  Code Status   Code Status: Prior  Home/SNF/Other Home Patient oriented to: self, place, time and situation Is this baseline? Yes   Triage Complete: Triage complete  Chief Complaint Acute respiratory failure with hypoxia [facility]) [J96.01]  Triage Note Per Bar Nunn RN: Patient presents to the ED today from cancer center for SOB. Patient reports she becomes more SOB when she stops walking but cannot walk far. Patient alert and oriented.    Allergies Allergies  Allergen Reactions  . Shellfish Allergy Itching and Swelling  . Sulfamethoxazole     REACTION: swelling    Level of Care/Admitting Diagnosis ED Disposition    ED Disposition Condition Comment   Admit  Hospital Area: [facility] [[id number]]  Level of Care: Telemetry [5]  Admit to tele based on following criteria: Acute CHF  May admit patient to [facility] or [facility] if equivalent level of care is available:: Yes  Covid Evaluation: Asymptomatic Screening Protocol (No Symptoms)  Diagnosis: [id number]  Admitting Physician: Elwyn Reach [[id number]]  Attending Physician: Elwyn Reach [[id number]]  Estimated length of stay: past midnight tomorrow  Certification:: I certify this patient will need inpatient services for at least 2 midnights       B Medical/Surgery History Past Medical History:  Diagnosis Date  . Allergic rhinitis   . Hyperlipidemia    LDL 150's '11   Past Surgical History:  Procedure Laterality Date  . CESAREAN SECTION    . IR IMAGING GUIDED PORT INSERTION  11/07/2019  . IR THORACENTESIS ASP PLEURAL SPACE W/IMG GUIDE  11/09/2019  . IR US GUIDE BX ASP/DRAIN  10/29/2019     A IV Location/Drains/Wounds Patient Lines/Drains/Airways Status   Active Line/Drains/Airways    Name:    Placement date:   Placement time:   Site:   Days:   Implanted Port 11/07/19 Right Chest   11/07/19    1451    Chest   42   External Urinary Catheter   12/19/19    2030    --   less than 1          Intake/Output Last 24 hours No intake or output data in the 24 hours ending 12/19/19 2240  Labs/Imaging Results for orders placed or performed during the hospital encounter of 12/19/19 (from the past 48 hour(s))  Basic metabolic panel     Status: Abnormal   Collection Time: 12/19/19  5:02 PM  Result Value Ref Range   Sodium 140 135 - 145 mmol/L   Potassium 3.9 3.5 - 5.1 mmol/L   Chloride 104 98 - 111 mmol/L   CO2 24 22 - 32 mmol/L   Glucose, Bld 92 70 - 99 mg/dL    Comment: Glucose reference range applies only to samples taken after fasting for at least 8 hours.   BUN 9 6 - 20 mg/dL   Creatinine, Ser 0.36 (L) 0.44 - 1.00 mg/dL   Calcium 8.6 (L) 8.9 - 10.3 mg/dL   GFR calc non Af Amer >60 >60 mL/min   GFR calc Af Amer >60 >60 mL/min   Anion gap 12 5 - 15    Comment: Performed at [facility], [place] [address], [place], [place] [address]  CBC     Status: Abnormal   Collection Time: 12/19/19  5:02 PM  Result Value Ref Range   WBC 0.7 (LL) 4.0 - 10.5 K/uL    Comment: REPEATED TO VERIFY WHITE COUNT CONFIRMED ON SMEAR THIS CRITICAL RESULT HAS VERIFIED AND BEEN CALLED TO MCIBER,M RN BY MARINDA BLACK ON 03 10 2021 AT 50, AND HAS BEEN READ BACK. CRITICAL WBC OF 0.7 RBV TO MCIBER, M RN BLACK,M [id number] 17:34    RBC 3.68 (L) 3.87 - 5.11 MIL/uL   Hemoglobin 10.3 (L) 12.0 - 15.0 g/dL   HCT 33.5 (L) 36.0 - 46.0 %   MCV 91.0 80.0 - 100.0 fL   MCH 28.0 26.0 - 34.0 pg   MCHC 30.7 30.0 - 36.0 g/dL   RDW 16.3 (H) 11.5 - 15.5 %   Platelets 135 (L) 150 - 400 K/uL    Comment: REPEATED TO VERIFY PLATELET COUNT CONFIRMED BY SMEAR Immature Platelet Fraction may be clinically indicated, consider ordering this additional test [id number]    nRBC 0.0 0.0 - 0.2 %    Comment: Performed  at [facility], [place] [address], [place], [place] [address]  Lactic acid, plasma     Status: None   Collection Time: 12/19/19  8:06 PM  Result Value Ref Range   Lactic Acid, Venous 1.1 0.5 - 1.9 mmol/L    Comment: Performed at [facility], [place] [address] [place], [place] [address]   DG Chest 2 View  Result Date: 12/19/2019 CLINICAL DATA:  Dyspnea. EXAM: CHEST - 2 VIEW COMPARISON:  March 2nd 2021 FINDINGS: Redemonstration of a right internal jugular central vascular catheter port, its tip projecting on the superior vena cavoatrial junction. Similar lung inflation and subtle interstitial edema with worsened pleural fluid and dense consolidation on the left that now obscures the left heart border and continues to obscure the left hemidiaphragm. Spinal dextrocurvature, mild skeletal degenerative change, and aortic knob calcified atherosclerosis are redemonstrated. IMPRESSION: Interval worsened left pleural effusion and consolidation, possibly malignant or bland pleural effusion with compressive atelectasis versus a left pneumonia. Stable central catheter port placement and similar persistent interstitial edema. Aortic calcified atherosclerosis. Electronically Signed   By: Revonda Humphrey   On: 12/19/2019 17:52   CT Angio Chest PE W/Cm &/Or Wo Cm  Result Date: 12/19/2019 CLINICAL DATA:  54 year old female with increased shortness of breath. Metastatic breast cancer. Recent ultrasound-guided left side thoracentesis. EXAM: CT ANGIOGRAPHY CHEST WITH CONTRAST TECHNIQUE: Multidetector CT imaging of the chest was performed using the standard protocol during bolus administration of intravenous contrast. Multiplanar CT image reconstructions and MIPs were obtained to evaluate the vascular anatomy. CONTRAST:  110mL OMNIPAQUE IOHEXOL 350 MG/ML SOLN COMPARISON:  Post thoracentesis chest radiograph 12/11/2019. Two-view chest radiographs earlier today. CTA chest 10/25/2019. FINDINGS:  Cardiovascular: Good contrast bolus timing in the pulmonary arterial tree. Intermittent respiratory motion. No central or left lung pulmonary embolus. Enhancing compressed left lower lung. Distal right lung branches are obscured by motion, but no convincing pulmonary artery filling defect is identified. Stable and normal cardiac size. No pericardial effusion. Negative visible aorta. Right IJ approach Port-A-Cath now in place. Mediastinum/Nodes: No mediastinal or hilar lymphadenopathy identified. Lungs/Pleura: Large layering and sub pulmonic left pleural effusion with simple fluid density has significantly increased since the thoracentesis on 12/11/2019. But no areas of left pleural nodularity or mass are identified. There is a new trace right pleural effusion. There is increasing peribronchial and confluent right upper lung opacity. Patchy opacity was present here in January. This has an inflammatory appearance. Major airways are patent aside from atelectatic  changes and respiratory motion. There is mild diffuse pulmonary septal thickening. Upper Abdomen: Negative visible liver, spleen, pancreas, adrenal glands, kidneys and bowel in the upper abdomen. Musculoskeletal: Diffuse osseous metastatic disease in the visible skeleton. Osteolysis and pathologic fracture of the posterior left 1st rib has increased since January. Review of the MIP images confirms the above findings. IMPRESSION: 1. No acute pulmonary embolus identified. Distal right lung pulmonary branches are degraded by motion. 2. Large layering and subpulmonic left pleural effusion with significant increase since the thoracentesis on 12/11/2019. Left lung compressive atelectasis. No left pleural metastasis identified. 3. Increased dependent right upper lung opacity since January which appears inflammatory. Consider pneumonia, aspiration. 4. Mild pulmonary interstitial edema suspected. Trace new right pleural effusion. 5. Diffuse osseous metastatic disease  with some progression since January, including increased lysis and pathologic fracture of the posterior left 1st rib. Electronically Signed   By: Genevie Ann M.D.   On: 12/19/2019 19:40    Pending Labs Unresulted Labs (From admission, onward)    Start     Ordered   12/19/19 1945  Culture, blood (Routine X 2) w Reflex to ID Panel  BLOOD CULTURE X 2,   R (with STAT occurrences)     12/19/19 1944   12/19/19 1943  SARS CORONAVIRUS 2 (TAT 6-24 HRS) Nasopharyngeal Nasopharyngeal Swab  (Tier 3 (TAT 6-24 hrs))  Once,   STAT    Question Answer Comment  Is this test for diagnosis or screening Screening   Symptomatic for COVID-19 as defined by CDC No   Hospitalized for COVID-19 No   Admitted to ICU for COVID-19 No   Previously tested for COVID-19 Yes   Resident in a congregate (group) care setting No   Employed in healthcare setting No   Pregnant No      12/19/19 1942   Signed and Held  CBC  (heparin)  Once,   R    Comments: Baseline for heparin therapy IF NOT ALREADY DRAWN.  Notify MD if PLT < 100 K.    Signed and Held   Signed and Held  Creatinine, serum  (heparin)  Once,   R    Comments: Baseline for heparin therapy IF NOT ALREADY DRAWN.    Signed and Held   Signed and Held  Comprehensive metabolic panel  Tomorrow morning,   R     Signed and Held   Signed and Held  CBC  Tomorrow morning,   R     Signed and Held          Vitals/Pain Today's Vitals   12/19/19 2145 12/19/19 2200 12/19/19 2215 12/19/19 2230  BP: 133/90 (!) 125/94 (!) 126/94 (!) 134/92  Pulse: 88  90 99  Resp: (!) 21 (!) 21 (!) 22 (!) 24  Temp:      TempSrc:      SpO2: 100%  100% 100%  PainSc:        Isolation Precautions No active isolations  Medications Medications  sodium chloride flush (NS) 0.9 % injection 3 mL (has no administration in time range)  vancomycin (VANCOREADY) IVPB 1500 mg/300 mL (1,500 mg Intravenous New Bag/Given 12/19/19 2056)  ceFEPIme (MAXIPIME) 2 g in sodium chloride 0.9 % 100 mL IVPB (has  no administration in time range)  iohexol (OMNIPAQUE) 350 MG/ML injection 100 mL (100 mLs Intravenous Contrast Given 12/19/19 1906)  ceFEPIme (MAXIPIME) 2 g in sodium chloride 0.9 % 100 mL IVPB (0 g Intravenous Stopped 12/19/19 2059)    Mobility walks Low  fall risk   Focused Assessments    R Recommendations: See Admitting Provider Note  Report given to: Mechele Claude [phone]  Additional Notes:

## 2019-12-19 NOTE — Progress Notes (Signed)
@   1630 transferred patient to ER room #11 via w/c due to acute onset of shortness of breath getting from w/c into car after her blood transfusion today. NT put oxygen on her at 2 l/min [place] and sat 100% with breathing improved after sitting. Wanted to take oxygen tank home, but she has no home oxygen. NP, Ned Card encouraged her to go to ER to be evaluated. Report given to ER nurse, Laqueta Carina.

## 2019-12-19 NOTE — Telephone Encounter (Signed)
Received critical lab call for patient anc 0.1 Manuela Schwartz RN made aware

## 2019-12-19 NOTE — Progress Notes (Signed)
When patient arrived back from CT after lying flat, she became extremely short of breath. She felt like she couldn't get her breath. Contacted MD. MD came to bedside and requested we place patient on NRB temporarily for her to get her breath because she was mouth breathing. Will continue to monitor.

## 2019-12-19 NOTE — Progress Notes (Signed)
A consult was received from an ED physician for vanc per pharmacy dosing.  The patient's profile has been reviewed for ht/wt/allergies/indication/available labs.   A one time order has been placed for vanc 1500mg .  Further antibiotics/pharmacy consults should be ordered by admitting physician if indicated.                       Thank you, Kara Mead 12/19/2019  7:55 PM

## 2019-12-20 ENCOUNTER — Encounter (HOSPITAL_COMMUNITY): Payer: Self-pay | Admitting: Internal Medicine

## 2019-12-20 ENCOUNTER — Inpatient Hospital Stay (HOSPITAL_COMMUNITY): Payer: 59

## 2019-12-20 DIAGNOSIS — J90 Pleural effusion, not elsewhere classified: Secondary | ICD-10-CM

## 2019-12-20 DIAGNOSIS — J181 Lobar pneumonia, unspecified organism: Secondary | ICD-10-CM

## 2019-12-20 DIAGNOSIS — J9601 Acute respiratory failure with hypoxia: Secondary | ICD-10-CM

## 2019-12-20 HISTORY — PX: IR PERC PLEURAL DRAIN W/INDWELL CATH W/IMG GUIDE: IMG5383

## 2019-12-20 LAB — COMPREHENSIVE METABOLIC PANEL
ALT: 20 U/L (ref 0–44)
AST: 15 U/L (ref 15–41)
Albumin: 2.7 g/dL — ABNORMAL LOW (ref 3.5–5.0)
Alkaline Phosphatase: 205 U/L — ABNORMAL HIGH (ref 38–126)
Anion gap: 8 (ref 5–15)
BUN: 7 mg/dL (ref 6–20)
CO2: 27 mmol/L (ref 22–32)
Calcium: 8.5 mg/dL — ABNORMAL LOW (ref 8.9–10.3)
Chloride: 105 mmol/L (ref 98–111)
Creatinine, Ser: 0.38 mg/dL — ABNORMAL LOW (ref 0.44–1.00)
GFR calc Af Amer: >60 mL/min (ref >60)
GFR calc non Af Amer: >60 mL/min (ref >60)
Glucose, Bld: 92 mg/dL (ref 70–99)
Potassium: 3.8 mmol/L (ref 3.5–5.1)
Sodium: 140 mmol/L (ref 135–145)
Total Bilirubin: 0.8 mg/dL (ref 0.3–1.2)
Total Protein: 6.1 g/dL — ABNORMAL LOW (ref 6.5–8.1)

## 2019-12-20 LAB — CBC
HCT: 31.9 % — ABNORMAL LOW (ref 36.0–46.0)
Hemoglobin: 9.9 g/dL — ABNORMAL LOW (ref 12.0–15.0)
MCH: 28.4 pg (ref 26.0–34.0)
MCHC: 31 g/dL (ref 30.0–36.0)
MCV: 91.7 fL (ref 80.0–100.0)
Platelets: 110 10*3/uL — ABNORMAL LOW (ref 150–400)
RBC: 3.48 MIL/uL — ABNORMAL LOW (ref 3.87–5.11)
RDW: 16.4 % — ABNORMAL HIGH (ref 11.5–15.5)
WBC: 0.7 10*3/uL — CL (ref 4.0–10.5)
nRBC: 0 % (ref 0.0–0.2)

## 2019-12-20 LAB — TYPE AND SCREEN
ABO/RH(D): B POS
Antibody Screen: NEGATIVE
Unit division: 0
Unit division: 0

## 2019-12-20 LAB — SARS CORONAVIRUS 2 (TAT 6-24 HRS): SARS Coronavirus 2: NEGATIVE

## 2019-12-20 LAB — BPAM RBC
Blood Product Expiration Date: 202103192359
Blood Product Expiration Date: 202104042359
ISSUE DATE / TIME: 202103101055
ISSUE DATE / TIME: 202103101055
Unit Type and Rh: 7300
Unit Type and Rh: 7300

## 2019-12-20 IMAGING — XA IR PERC PLEURAL DRAIN W/INDWELL CATH W/IMG GUIDE
2 series · 3 of 3 positions shown · non-contrast
Comparison: none

CLINICAL DATA: Recurrent malignant left pleural effusion from
metastatic breast carcinoma.

[Series 1: (id) · 1 of 1 slices shown]
[im 1/1]
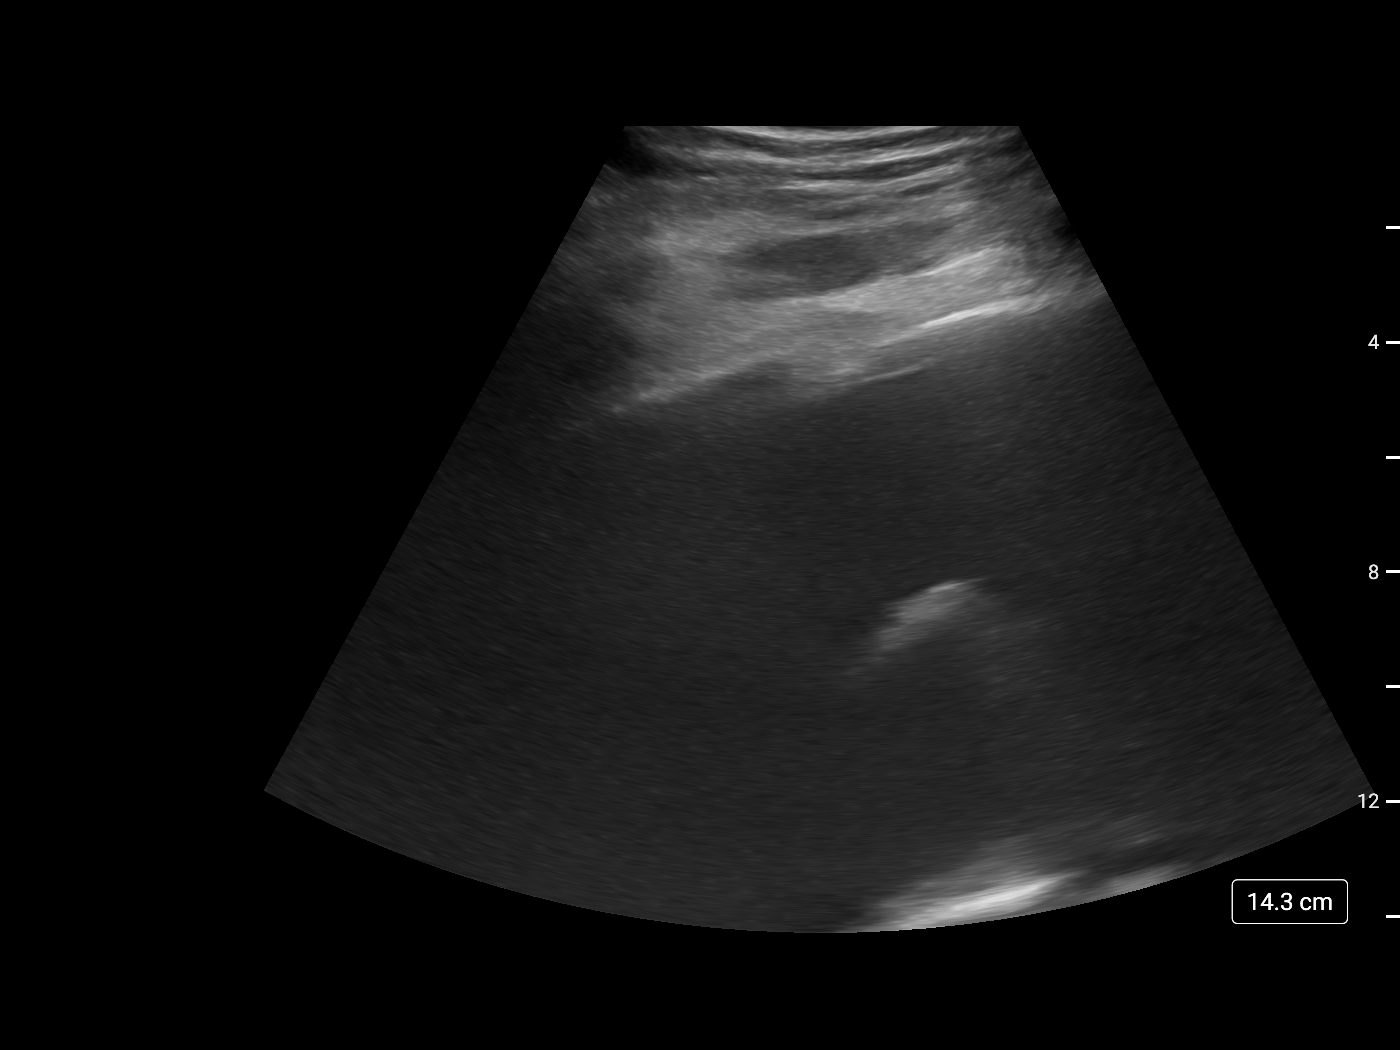

[Series 300: ld dsa body · 2 of 2 slices shown]
[im 1/2]
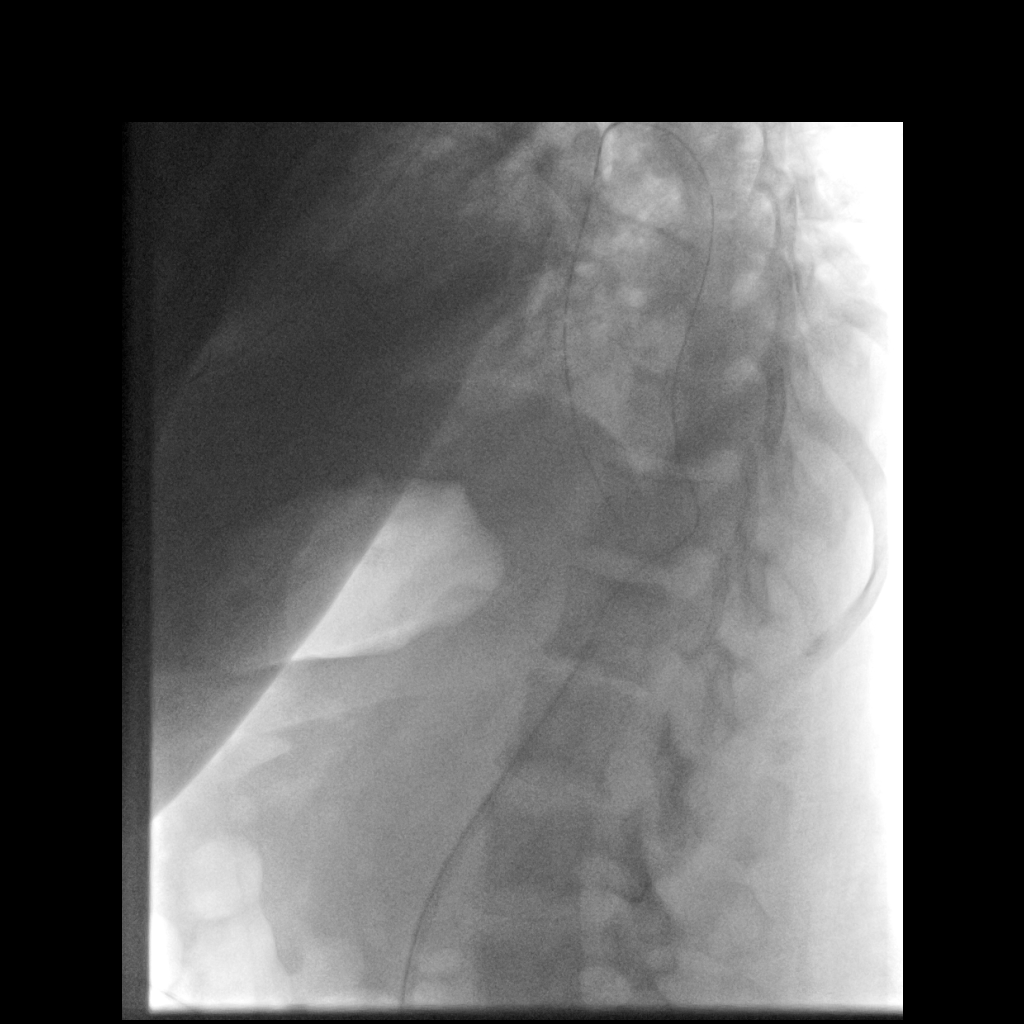
[im 2/2]
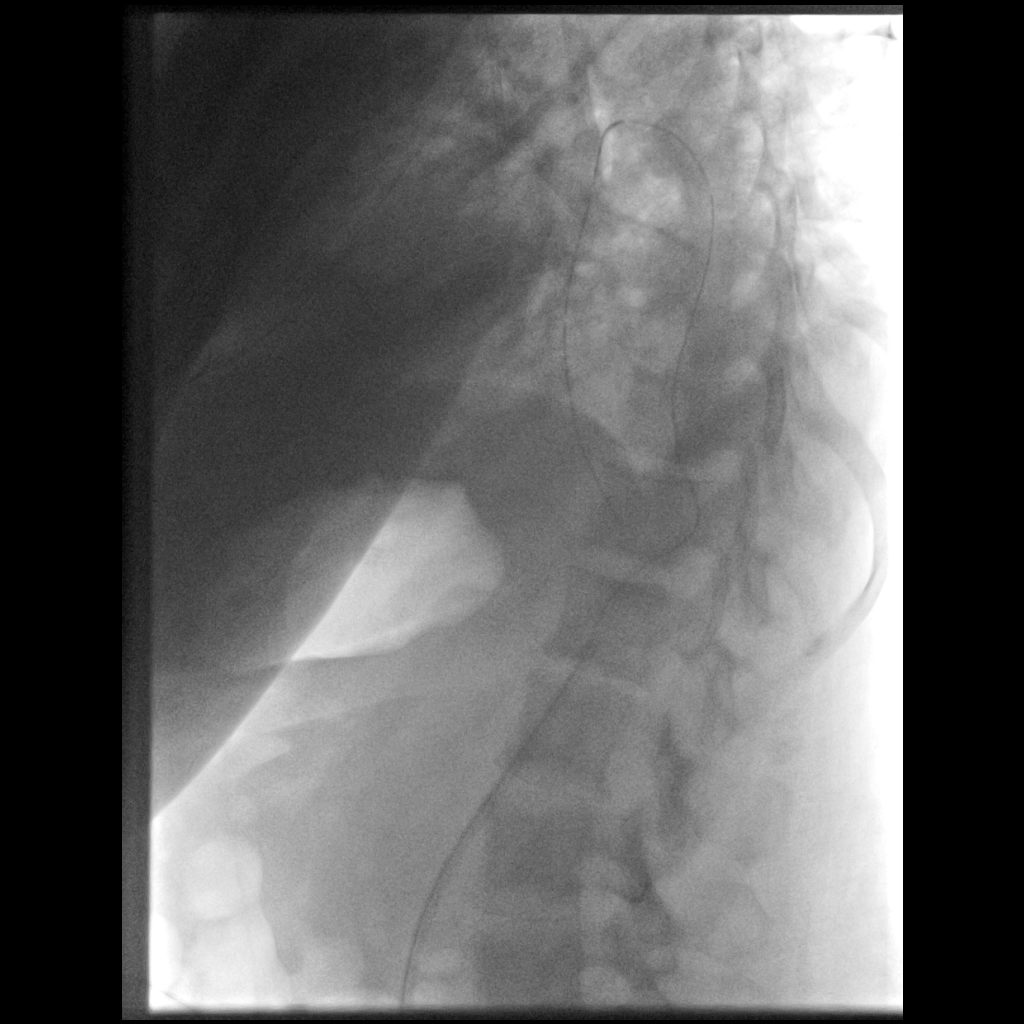

[3 of 3 positions shown; findings below may reference images not displayed]

EXAM:
INSERTION OF TUNNELED PLEURAL DRAINAGE CATHETER

ANESTHESIA/SEDATION:
2.0 mg IV Versed; 100 mcg IV Fentanyl.

Total Moderate Sedation Time

24 minutes.

The patient's level of consciousness and physiologic status were
continuously monitored during the procedure by Radiology nursing.

MEDICATIONS:
2 g IV Ancef. Antibiotic was administered in an appropriate time
interval for the procedure.

FLUOROSCOPY TIME:  24 seconds.  63 mGy.

PROCEDURE:
The procedure, risks, benefits, and alternatives were explained to
the patient. Questions regarding the procedure were encouraged and
answered. The patient understands and consents to the procedure. A
time-out was performed prior to initiating the procedure.

The left chest wall was prepped with chlorhexidine in a sterile
fashion, and a sterile drape was applied covering the operative
field. A sterile gown and sterile gloves were used for the
procedure. Local anesthesia was provided with 1% Lidocaine.
Ultrasound image documentation was performed. Fluoroscopy during the
procedure and fluoroscopic spot radiograph confirms appropriate
catheter position.

After creating a small skin incision, a 19 gauge needle was advanced
into the pleural cavity under ultrasound guidance. A guide wire was
then advanced under fluoroscopy into the pleural space. Pleural
access was dilated serially and a 16-French peel-away sheath placed.

A [DATE] French tunneled PleurX catheter was placed. This was tunneled
from an incision 5 cm below the pleural access to the access site.
The catheter was advanced through the peel-away sheath. The sheath
was then removed. Final catheter positioning was confirmed with a
fluoroscopic spot image.

The access incision was closed with subcuticular 4-0 Vicryl.
Dermabond was applied to the incision. A Prolene retention suture
was applied at the catheter exit site. Large volume thoracentesis
was performed through the new catheter utilizing a vacuum bottle.

COMPLICATIONS:
None.
FINDINGS: The catheter was placed via the left posterolateral chest wall.
Approximately 1 L of bloody pleural fluid was able to be removed
after catheter placement.
IMPRESSION: Placement of tunneled left PleurX drainage catheter into the left
pleural space. 1 L of bloody pleural fluid was removed today after
catheter placement.

## 2019-12-20 MED ORDER — HEPARIN SODIUM (PORCINE) 5000 UNIT/ML IJ SOLN: 5000.0000 [IU] | Freq: Three times a day (TID) | INTRAMUSCULAR | Status: DC

## 2019-12-20 MED ORDER — MIDAZOLAM HCL 2 MG/2ML IJ SOLN
INTRAMUSCULAR | Status: AC
  Filled 2019-12-20: qty 4

## 2019-12-20 MED ORDER — IPRATROPIUM-ALBUTEROL 0.5-2.5 (3) MG/3ML IN SOLN
3.0000 mL | RESPIRATORY_TRACT | Status: DC | PRN
  Administered 2019-12-20 (×2): 3 mL via RESPIRATORY_TRACT
  Filled 2019-12-20 (×2): qty 3

## 2019-12-20 MED ORDER — ENOXAPARIN SODIUM 120 MG/0.8ML ~~LOC~~ SOLN
1.5000 mg/kg | SUBCUTANEOUS | Status: DC
  Administered 2019-12-21 – 2019-12-24 (×4): 110 mg via SUBCUTANEOUS
  Filled 2019-12-20 (×4): qty 0.73

## 2019-12-20 MED ORDER — CEFAZOLIN SODIUM-DEXTROSE 2-4 GM/100ML-% IV SOLN
2.0000 g | Freq: Once | INTRAVENOUS | Status: AC
  Administered 2019-12-20: 2 g via INTRAVENOUS

## 2019-12-20 MED ORDER — GUAIFENESIN-DM 100-10 MG/5ML PO SYRP: 5.0000 mL | ORAL_SOLUTION | ORAL | Status: DC | PRN

## 2019-12-20 MED ORDER — CHLORHEXIDINE GLUCONATE CLOTH 2 % EX PADS
6.0000 | MEDICATED_PAD | Freq: Every day | CUTANEOUS | Status: DC
  Administered 2019-12-20 – 2019-12-24 (×5): 6 via TOPICAL

## 2019-12-20 MED ORDER — LIDOCAINE HCL (PF) 1 % IJ SOLN
INTRAMUSCULAR | Status: AC | PRN
  Administered 2019-12-20: 10 mL

## 2019-12-20 MED ORDER — HYDROCODONE-ACETAMINOPHEN 5-325 MG PO TABS
1.0000 | ORAL_TABLET | Freq: Three times a day (TID) | ORAL | Status: DC | PRN
  Administered 2019-12-20 – 2019-12-21 (×3): 1 via ORAL
  Filled 2019-12-20 (×4): qty 1

## 2019-12-20 MED ORDER — LORAZEPAM 2 MG/ML IJ SOLN: INTRAMUSCULAR | Status: AC | PRN

## 2019-12-20 MED ORDER — FENTANYL CITRATE (PF) 100 MCG/2ML IJ SOLN
INTRAMUSCULAR | Status: AC | PRN
  Administered 2019-12-20 (×2): 50 ug via INTRAVENOUS

## 2019-12-20 MED ORDER — MIDAZOLAM HCL 2 MG/2ML IJ SOLN
INTRAMUSCULAR | Status: AC | PRN
  Administered 2019-12-20 (×2): 1 mg via INTRAVENOUS

## 2019-12-20 MED ORDER — SODIUM CHLORIDE 0.9% FLUSH
10.0000 mL | INTRAVENOUS | Status: DC | PRN
  Administered 2019-12-22: 10 mL

## 2019-12-20 MED ORDER — LIDOCAINE HCL 1 % IJ SOLN
INTRAMUSCULAR | Status: AC
  Filled 2019-12-20: qty 20

## 2019-12-20 MED ORDER — FENTANYL CITRATE (PF) 100 MCG/2ML IJ SOLN
INTRAMUSCULAR | Status: AC
  Filled 2019-12-20: qty 2

## 2019-12-20 MED ORDER — CEFAZOLIN SODIUM-DEXTROSE 2-4 GM/100ML-% IV SOLN
INTRAVENOUS | Status: AC
  Filled 2019-12-20: qty 100

## 2019-12-20 NOTE — Progress Notes (Addendum)
HEMATOLOGY-ONCOLOGY PROGRESS NOTE  SUBJECTIVE: The patient is resting quietly this morning.  Still having some shortness of breath but overall more comfortable.  Labs show pancytopenia secondary to recent chemotherapy.  She remains afebrile.  CT angiogram of the chest showed no PE but did show a large left pleural effusion with significant increase since the prior thoracentesis on 12/11/2019.  There was also a questionable right upper lobe pneumonia.  She is on IV antibiotics.  Oncology History  Breast cancer ([place])  11/02/2019 Initial Diagnosis   Breast cancer ([place])   11/02/2019 Cancer Staging   Staging form: Breast, AJCC 8th Edition - Clinical: Stage IV (cT4, cN2a, pM1, ER-, PR-, HER2-) - Signed by Ladell Pier, MD on 11/02/2019   11/14/2019 - 12/11/2019 Chemotherapy   The patient had PACLitaxel-protein bound (ABRAXANE) chemo infusion 175 mg, 100 mg/m2 = 175 mg, Intravenous,  Once, 1 of 4 cycles Administration: 175 mg (11/14/2019), 175 mg (11/21/2019), 175 mg (11/28/2019)  for chemotherapy treatment.    12/12/2019 -  Chemotherapy   The patient had DOXOrubicin (ADRIAMYCIN) chemo injection 110 mg, 60 mg/m2 = 110 mg, Intravenous,  Once, 1 of 4 cycles Administration: 110 mg (12/12/2019) palonosetron (ALOXI) injection 0.25 mg, 0.25 mg, Intravenous,  Once, 1 of 4 cycles Administration: 0.25 mg (12/12/2019) pegfilgrastim-cbqv (UDENYCA) injection 6 mg, 6 mg, Subcutaneous, Once, 1 of 4 cycles Administration: 6 mg (12/14/2019) cyclophosphamide (CYTOXAN) 1,100 mg in sodium chloride 0.9 % 250 mL chemo infusion, 600 mg/m2 = 1,100 mg, Intravenous,  Once, 1 of 4 cycles Administration: 1,100 mg (12/12/2019) fosaprepitant (EMEND) 150 mg in sodium chloride 0.9 % 145 mL IVPB, 150 mg, Intravenous,  Once, 1 of 4 cycles Administration: 150 mg (12/12/2019)  for chemotherapy treatment.     PHYSICAL EXAMINATION:  Vitals:   12/20/19 0448 12/20/19 0550  BP:    Pulse:    Resp:    Temp:    SpO2: 99% 97%   Filed Weights    12/19/19 2317  Weight: 75.3 kg    Intake/Output from previous day: No intake/output data recorded.  GENERAL:alert, no distress and comfortable OROPHARYNX: No thrush or mucositis LUNGS: Rare on the right, diminished breath sounds in the left lung field HEART: regular rate & rhythm and no murmurs and no lower extremity edema, left upper extremity with persistent edema. ABDOMEN:abdomen soft, non-tender and normal bowel sounds SKIN: Persistent thickening at the left upper anterior chest and back, improved nodularity at the left axilla NEURO: alert & oriented x 3 with fluent speech, no focal motor/sensory deficits  Port-A-Cath without erythema  LABORATORY DATA:  I have reviewed the data as listed CMP Latest Ref Rng & Units 12/20/2019 12/19/2019 12/12/2019  Glucose 70 - 99 mg/dL 92 92 105(H)  BUN 6 - 20 mg/dL '7 9 8  ' Creatinine 0.44 - 1.00 mg/dL 0.38(L) 0.36(L) 0.66  Sodium 135 - 145 mmol/L 140 140 140  Potassium 3.5 - 5.1 mmol/L 3.8 3.9 3.2(L)  Chloride 98 - 111 mmol/L 105 104 102  CO2 22 - 32 mmol/L '27 24 28  ' Calcium 8.9 - 10.3 mg/dL 8.5(L) 8.6(L) 8.9  Total Protein 6.5 - 8.1 g/dL 6.1(L) - 6.4(L)  Total Bilirubin 0.3 - 1.2 mg/dL 0.8 - 0.4  Alkaline Phos 38 - 126 U/L 205(H) - 347(H)  AST 15 - 41 U/L 15 - 23  ALT 0 - 44 U/L 20 - 31    Lab Results  Component Value Date   WBC 0.7 (LL) 12/20/2019   HGB 9.9 (L) 12/20/2019  HCT 31.9 (L) 12/20/2019   MCV 91.7 12/20/2019   PLT 110 (L) 12/20/2019   NEUTROABS 0.1 (LL) 12/19/2019    DG Chest 1 View  Result Date: 12/11/2019 CLINICAL DATA:  54 year old female status post ultrasound-guided left side thoracentesis this afternoon. Metastatic breast cancer. EXAM: CHEST  1 VIEW COMPARISON:  Chest radiographs 11/28/2019 and earlier. FINDINGS: PA view at 1318 hours. No pneumothorax. Residual left side pleural effusion similar to that last month. Small or trace right pleural effusion also now suspected. Stable right chest power port. Stable cardiac  size and mediastinal contours. Stable nonspecific bilateral pulmonary reticulonodular opacity. Abnormal bone mineralization. The left glenohumeral joint has become dislocated since January. Negative visible bowel gas pattern. IMPRESSION: 1. No pneumothorax following Left side thoracentesis. Small left greater than right pleural effusions. 2. Osseous metastatic disease with dislocated left glenohumeral joint since January. 3. Stable nonspecific pulmonary interstitial opacity. Electronically Signed   By: Genevie Ann M.D.   On: 12/11/2019 15:28   DG Chest 1 View  Result Date: 11/28/2019 CLINICAL DATA:  54 year old female status post ultrasound-guided thoracentesis of malignant left pleural effusion. Breast cancer. EXAM: CHEST  1 VIEW COMPARISON:  PA chest 11/09/2019 and earlier. FINDINGS: PA view at 1622 hours. Small residual left pleural effusion, appears larger since January. No pneumothorax. Stable mediastinal contours. Coarse bilateral pulmonary interstitial opacity is stable. Stable right chest power port. Stable visualized osseous structures. Negative visible bowel gas pattern. IMPRESSION: No pneumothorax following left side thoracentesis. Residual small volume effusion. Electronically Signed   By: Genevie Ann M.D.   On: 11/28/2019 16:31   DG Chest 2 View  Result Date: 12/19/2019 CLINICAL DATA:  Dyspnea. EXAM: CHEST - 2 VIEW COMPARISON:  March 2nd 2021 FINDINGS: Redemonstration of a right internal jugular central vascular catheter port, its tip projecting on the superior vena cavoatrial junction. Similar lung inflation and subtle interstitial edema with worsened pleural fluid and dense consolidation on the left that now obscures the left heart border and continues to obscure the left hemidiaphragm. Spinal dextrocurvature, mild skeletal degenerative change, and aortic knob calcified atherosclerosis are redemonstrated. IMPRESSION: Interval worsened left pleural effusion and consolidation, possibly malignant or  bland pleural effusion with compressive atelectasis versus a left pneumonia. Stable central catheter port placement and similar persistent interstitial edema. Aortic calcified atherosclerosis. Electronically Signed   By: Revonda Humphrey   On: 12/19/2019 17:52   CT Angio Chest PE W/Cm &/Or Wo Cm  Result Date: 12/19/2019 CLINICAL DATA:  54 year old female with increased shortness of breath. Metastatic breast cancer. Recent ultrasound-guided left side thoracentesis. EXAM: CT ANGIOGRAPHY CHEST WITH CONTRAST TECHNIQUE: Multidetector CT imaging of the chest was performed using the standard protocol during bolus administration of intravenous contrast. Multiplanar CT image reconstructions and MIPs were obtained to evaluate the vascular anatomy. CONTRAST:  149m OMNIPAQUE IOHEXOL 350 MG/ML SOLN COMPARISON:  Post thoracentesis chest radiograph 12/11/2019. Two-view chest radiographs earlier today. CTA chest 10/25/2019. FINDINGS: Cardiovascular: Good contrast bolus timing in the pulmonary arterial tree. Intermittent respiratory motion. No central or left lung pulmonary embolus. Enhancing compressed left lower lung. Distal right lung branches are obscured by motion, but no convincing pulmonary artery filling defect is identified. Stable and normal cardiac size. No pericardial effusion. Negative visible aorta. Right IJ approach Port-A-Cath now in place. Mediastinum/Nodes: No mediastinal or hilar lymphadenopathy identified. Lungs/Pleura: Large layering and sub pulmonic left pleural effusion with simple fluid density has significantly increased since the thoracentesis on 12/11/2019. But no areas of left pleural nodularity or mass are  identified. There is a new trace right pleural effusion. There is increasing peribronchial and confluent right upper lung opacity. Patchy opacity was present here in January. This has an inflammatory appearance. Major airways are patent aside from atelectatic changes and respiratory motion. There is  mild diffuse pulmonary septal thickening. Upper Abdomen: Negative visible liver, spleen, pancreas, adrenal glands, kidneys and bowel in the upper abdomen. Musculoskeletal: Diffuse osseous metastatic disease in the visible skeleton. Osteolysis and pathologic fracture of the posterior left 1st rib has increased since January. Review of the MIP images confirms the above findings. IMPRESSION: 1. No acute pulmonary embolus identified. Distal right lung pulmonary branches are degraded by motion. 2. Large layering and subpulmonic left pleural effusion with significant increase since the thoracentesis on 12/11/2019. Left lung compressive atelectasis. No left pleural metastasis identified. 3. Increased dependent right upper lung opacity since January which appears inflammatory. Consider pneumonia, aspiration. 4. Mild pulmonary interstitial edema suspected. Trace new right pleural effusion. 5. Diffuse osseous metastatic disease with some progression since January, including increased lysis and pathologic fracture of the posterior left 1st rib. Electronically Signed   By: Genevie Ann M.D.   On: 12/19/2019 19:40   US Thoracentesis Asp Pleural space w/IMG guide  Result Date: 12/11/2019 INDICATION: Patient with history of metastatic breast cancer, recurrent symptomatic left malignant pleural effusion. Request to IR for therapeutic left thoracentesis. EXAM: ULTRASOUND GUIDED LEFT THORACENTESIS MEDICATIONS: 10 mL 1% lidocaine COMPLICATIONS: None immediate. PROCEDURE: An ultrasound guided thoracentesis was thoroughly discussed with the patient and questions answered. The benefits, risks, alternatives and complications were also discussed. The patient understands and wishes to proceed with the procedure. Written consent was obtained. Ultrasound was performed to localize and mark an adequate pocket of fluid in the left chest. The area was then prepped and draped in the normal sterile fashion. 1% Lidocaine was used for local anesthesia.  Under ultrasound guidance a 6 Fr Safe-T-Centesis catheter was introduced. Thoracentesis was performed. The catheter was removed and a dressing applied. FINDINGS: A total of approximately 750 mL of clear yellow fluid was removed. IMPRESSION: Successful ultrasound guided left thoracentesis yielding 750 mL of pleural fluid. Read by Candiss Norse, PA-C Electronically Signed   By: Markus Daft M.D.   On: 12/11/2019 16:16   US THORACENTESIS ASP PLEURAL SPACE W/IMG GUIDE  Result Date: 11/28/2019 INDICATION: Patient with history of metastatic breast cancer, dyspnea, recurrent left pleural effusion. Request made for therapeutic left thoracentesis. EXAM: ULTRASOUND GUIDED THERAPEUTIC LEFT THORACENTESIS MEDICATIONS: None COMPLICATIONS: None immediate. PROCEDURE: An ultrasound guided thoracentesis was thoroughly discussed with the patient and questions answered. The benefits, risks, alternatives and complications were also discussed. The patient understands and wishes to proceed with the procedure. Written consent was obtained. Ultrasound was performed to localize and mark an adequate pocket of fluid in the left chest. The area was then prepped and draped in the normal sterile fashion. 1% Lidocaine was used for local anesthesia. Under ultrasound guidance a 6 Fr Safe-T-Centesis catheter was introduced. Thoracentesis was performed. The catheter was removed and a dressing applied. FINDINGS: A total of approximately 700 cc of yellow fluid was removed. Due to patient coughing and chest discomfort only the above amount of fluid was removed today. IMPRESSION: Successful ultrasound guided therapeutic left thoracentesis yielding 700 cc of pleural fluid. Read by: Rowe Robert, PA-C Electronically Signed   By: Sandi Mariscal M.D.   On: 11/28/2019 16:19    ASSESSMENT AND PLAN: 1.Metastatic breast cancer  CT chest 10/25/2019-left breast mass left axillary lymph  nodes, abnormal appearance of the left concerning for tumor  involvement, mediastinal adenopathy, diffuse sclerotic and lytic lesions, left pleural effusion  CT abdomen/pelvis 10/26/2019 enhancing left breast mass with left axillary lymphadenopathy and tumor involved the left chest wall the latissimus dorsi segment 4B liver lesion, diffuse lytic/sclerotic lesions  Ultrasound-guided biopsy of left liver lesion 10/29/1999-metastatic carcinoma, cytokeratin andGATA3positive consistent with metastatic breast cancer. HER-2 negative, ER negative, PR negative, Ki-[id number]%. PD-L1 <1% expression   [employer] hereditary panel negative  Initiated systemic chemotherapy weekly abraxane 100 mg/m2 days 1, 8, 15 q28 days   Cycle 1 day 1 on 11/14/19  Cycle 1 day 8 on 11/21/19 (hg 7.5, receive 1 unit RBCs)  Cycle 1 day 15 on 11/28/2019  Cycle 1 Adriamycin/Cytoxan 12/12/2019 2. Left upper extremity DVT-left subclavian, axillary, and brachial vein thrombosis confirmed on  Anticoagulation with apixaban, changed to parenteral hospital admission1/14/2021transition to twice daily Lovenox at discharge; transition to once daily 11/08/2019. 3.Anemia-likely secondary to metastatic breast cancer involving the bone marrow 4.Left pleural effusion-thoracentesis 11/09/2019, cytology positive for malignant cells 5.Port-A-Cath placement1/27/2021, interventional radiology 6.2D echo 11/08/2019-ejection fraction 55 to 60%, no pericardial effusion 7.Tachycardia-likely multifactorial secondary to cancer burden, anemia, pleural effusion-improved 8.  Neutropenia secondary to chemotherapy 12/19/2019.  Prophylactic ciprofloxacin initiated. [facility] admission 12/19/2019-acute respiratory failure with hypoxia, large left pleural effusion, ?  HCAP, pancytopenia  Abigail [name] is now admitted secondary to acute respiratory failure with hypoxia. CT angiogram showed large left pleural effusion and questionable pneumonia.  She is on IV antibiotics.  She remains afebrile.  She is pancytopenic  secondary to recent chemotherapy.  Recommendations: 1.  Interventional radiology consult requested for placement of Pleurx catheter for malignant pleural effusion. 2.  Monitor CBC closely.  She is already received Neulasta so no additional growth factor support is indicated. 3.  IV antibiotics per hospitalist.  Follow cultures.   LOS: 1 day   Mikey Bussing, DNP, AGPCNP-BC, AOCNP 12/20/19  Abigail [name] was interviewed and examined.  She was admitted yesterday after developing increased dyspnea.  She received a red cell transfusion at the Cancer center yesterday.  She has severe neutropenia secondary to chemotherapy, now and day 9 following cycle 1 Adriamycin/Cytoxan.  She received G-CSF on day 2.  The dyspnea is likely related to the large left pleural effusion with compressive atelectasis.  There are infiltrative changes in the right lung.  This could be related to pneumonia or metastatic disease.  I agree with broad-spectrum intravenous antibiotics for now.  The white count should recover over the next few days.  We will consult interventional radiology for placement of a left Pleurx.

## 2019-12-20 NOTE — Plan of Care (Signed)
Patient is coping well

## 2019-12-20 NOTE — Procedures (Signed)
Interventional Radiology Procedure Note  Procedure: Left PleurX catheter placement  Complications: None  Estimated Blood Loss: < 10 mL  Findings: Left pleural PleurX placed from posterolateral approach under [place] and fluoro guidance. 1 L of bloody pleural fluid removed after catheter placement today.  Venetia Night. Kathlene Cote, M.D Pager:  [phone]

## 2019-12-20 NOTE — Progress Notes (Addendum)
PROGRESS NOTE   Abigail [name]  [id number]    DOB: [birth year]    DOA: 12/19/2019  PCP: Hoyt Koch, MD   I have briefly reviewed patients previous medical records in [facility].  Chief Complaint:   Chief Complaint  Patient presents with  . Shortness of Breath    Brief Narrative:  54 year old female, lives with her siblings, independent, PMH of metastatic breast cancer on chemotherapy, recurrent left pleural effusion for which she has undergone thoracentesis, hyperlipidemia, presented to [facility] from the cancer center on 12/19/2019 due to progressive dyspnea, some cough and hypoxia.  She was admitted for acute hypoxic respiratory failure due to recurrent large left pleural effusion, possible right upper lobe pneumonia and chemotherapy related pancytopenia.  Her oncologist was consulted and requested IR to place left Pleurx catheter.  IV antibiotics.   Assessment & Plan:  Principal Problem:   Acute respiratory failure with hypoxia (HCC) Active Problems:   Borderline hyperlipidemia   HCAP (healthcare-associated pneumonia)   Breast cancer ([place])   Acute respiratory failure with hypoxia: Due to recurrent large left pleural effusion with compressive atelectasis, suspected right upper lobe pneumonia seen on CTA chest on admission.  No PE noted.  Treat underlying cause.  Oxygen support and wean off as tolerated.  Incentive spirometry.  Recurrent large left pleural effusion: Suspect malignant effusion.  Has had prior thoracentesis by IR for aware of her case.  She is supposed to get a left Pleurx catheter today.  IR to recommend timing of resumption of her home dose of full dose Lovenox for left upper extremity DVT.  Addendum: As communicated with IR team, plan to start full dose Lovenox from tomorrow i.e. 24 hours after procedure.  Suspected right upper lobe pneumonia: Complicating chemotherapy related pancytopenia.  Continue IV cefepime and vancomycin per  pharmacy.  Pancytopenia: Secondary to chemotherapy, last dose on 12/12/2019.  Patient reportedly received growth factors on 3/4.  As per oncology follow-up, no need to repeat.  Follow CBCs closely.  Neutropenic precautions.  Metastatic breast cancer: Per oncology.  Hyperlipidemia: Continue statins.  Left upper extremity edema/DVT: Resume home dose of full dose Lovenox when cleared by IR post procedure.  Body mass index is 31.37 kg/m.   DVT prophylaxis: Resume Lovenox as discussed above Code Status: Full Family Communication: Discussed in detail with patient's niece at bedside. Disposition:  . Patient came from: Home           . Anticipated d/c place: Home . Barriers to d/c: Large left pleural effusion that requires intervention, pneumonia complicating neutropenia/pancytopenia that requires close monitoring and IV antibiotics.  Will likely be in the hospital for a couple of days.   Consultants:   Medical oncology Interventional radiology  Procedures:   None  Antimicrobials:   Vancomycin and cefepime   Subjective:  When interviewed and examined along with her nurse at bedside.  Reports that her dyspnea is slightly better.  Reports some chest tightness without pain.  Minimal dry cough.  Left upper extremity swelling has improved compared to prior.  Mild bilateral leg swelling improved to prior.  Denies any other complaints  Objective:   Vitals:   12/20/19 0400 12/20/19 0448 12/20/19 0550 12/20/19 1043  BP: (!) 138/94     Pulse: 99     Resp: (!) 22     Temp: 98.3 F (36.8 C)     TempSrc: Oral     SpO2: 100% 99% 97% 100%  Weight:  Height:        General exam: Pleasant young female, moderately built and obese lying comfortably propped up in bed without distress. Respiratory system: Reduced breath sounds in the bases, left >>right.  Occasional basal crackles but otherwise no wheezing or rhonchi.  No increased work of breathing and able to speak in full  sentences. Cardiovascular system: S1 & S2 heard, RRR. No JVD, murmurs, rubs, gallops or clicks.  Diffuse left upper extremity edema, chronic.  Trace bilateral ankle edema. Gastrointestinal system: Abdomen is nondistended, soft and nontender. No organomegaly or masses felt. Normal bowel sounds heard. Central nervous system: Alert and oriented. No focal neurological deficits. Extremities: Symmetric 5 x 5 power. Skin: No rashes, lesions or ulcers Psychiatry: Judgement and insight appear normal. Mood & affect appropriate.     Data Reviewed:   I have personally reviewed following labs and imaging studies   CBC: Recent Labs  Lab 12/19/19 0851 12/19/19 1702 12/20/19 0451  WBC 0.6* 0.7* 0.7*  NEUTROABS 0.1*  --   --   HGB 7.2* 10.3* 9.9*  HCT 24.4* 33.5* 31.9*  MCV 91.4 91.0 91.7  PLT 119* 135* 110*    Basic Metabolic Panel: Recent Labs  Lab 12/19/19 1702 12/20/19 0451  NA 140 140  K 3.9 3.8  CL 104 105  CO2 24 27  GLUCOSE 92 92  BUN 9 7  CREATININE 0.36* 0.38*  CALCIUM 8.6* 8.5*    Liver Function Tests: Recent Labs  Lab 12/20/19 0451  AST 15  ALT 20  ALKPHOS 205*  BILITOT 0.8  PROT 6.1*  ALBUMIN 2.7*    CBG: No results for input(s): GLUCAP in the last 168 hours.  Microbiology Studies:   Recent Results (from the past 240 hour(s))  Resp Panel by RT PCR (RSV, Flu A&B, Covid) -     Status: None   Collection Time: 12/11/19 12:15 PM  Result Value Ref Range Status   SARS Coronavirus 2 by RT PCR NEGATIVE NEGATIVE Final    Comment: (NOTE) SARS-CoV-2 target nucleic acids are NOT DETECTED. The SARS-CoV-2 RNA is generally detectable in upper respiratoy specimens during the acute phase of infection. The lowest concentration of SARS-CoV-2 viral copies this assay can detect is 131 copies/mL. A negative result does not preclude SARS-Cov-2 infection and should not be used as the sole basis for treatment or other patient management decisions. A negative result may occur  with  improper specimen collection/handling, submission of specimen other than nasopharyngeal swab, presence of viral mutation(s) within the areas targeted by this assay, and inadequate number of viral copies (<131 copies/mL). A negative result must be combined with clinical observations, patient history, and epidemiological information. The expected result is Negative. Fact Sheet for Patients:  PinkCheek.be Fact Sheet for Healthcare Providers:  GravelBags.it This test is not yet ap proved or cleared by the [place] FDA and  has been authorized for detection and/or diagnosis of SARS-CoV-2 by FDA under an Emergency Use Authorization (EUA). This EUA will remain  in effect (meaning this test can be used) for the duration of the COVID-19 declaration under Section 564(b)(1) of the Act, 21 U.S.C. section 360bbb-3(b)(1), unless the authorization is terminated or revoked sooner.    Influenza A by PCR NEGATIVE NEGATIVE Final   Influenza B by PCR NEGATIVE NEGATIVE Final    Comment: (NOTE) The Xpert Xpress SARS-CoV-2/FLU/RSV assay is intended as an aid in  the diagnosis of influenza from Nasopharyngeal swab specimens and  should not be used as a sole  basis for treatment. Nasal washings and  aspirates are unacceptable for Xpert Xpress SARS-CoV-2/FLU/RSV  testing. Fact Sheet for Patients: PinkCheek.be Fact Sheet for Healthcare Providers: GravelBags.it This test is not yet approved or cleared by the [place] FDA and  has been authorized for detection and/or diagnosis of SARS-CoV-2 by  FDA under an Emergency Use Authorization (EUA). This EUA will remain  in effect (meaning this test can be used) for the duration of the  Covid-19 declaration under Section 564(b)(1) of the Act, 21  U.S.C. section 360bbb-3(b)(1), unless the authorization is  terminated or revoked.     Respiratory Syncytial Virus by PCR NEGATIVE NEGATIVE Final    Comment: (NOTE) Fact Sheet for Patients: PinkCheek.be Fact Sheet for Healthcare Providers: GravelBags.it This test is not yet approved or cleared by the [place] FDA and  has been authorized for detection and/or diagnosis of SARS-CoV-2 by  FDA under an Emergency Use Authorization (EUA). This EUA will remain  in effect (meaning this test can be used) for the duration of the  COVID-19 declaration under Section 564(b)(1) of the Act, 21 U.S.C.  section 360bbb-3(b)(1), unless the authorization is terminated or  revoked. Performed at [facility], [place] [address] [place], [place] [address]   SARS CORONAVIRUS 2 (TAT 6-24 HRS) Nasopharyngeal Nasopharyngeal Swab     Status: None   Collection Time: 12/19/19  8:30 PM   Specimen: Nasopharyngeal Swab  Result Value Ref Range Status   SARS Coronavirus 2 NEGATIVE NEGATIVE Final    Comment: (NOTE) SARS-CoV-2 target nucleic acids are NOT DETECTED. The SARS-CoV-2 RNA is generally detectable in upper and lower respiratory specimens during the acute phase of infection. Negative results do not preclude SARS-CoV-2 infection, do not rule out co-infections with other pathogens, and should not be used as the sole basis for treatment or other patient management decisions. Negative results must be combined with clinical observations, patient history, and epidemiological information. The expected result is Negative. Fact Sheet for Patients: SugarRoll.be Fact Sheet for Healthcare Providers: https://www.woods-mathews.com/ This test is not yet approved or cleared by the [place] FDA and  has been authorized for detection and/or diagnosis of SARS-CoV-2 by FDA under an Emergency Use Authorization (EUA). This EUA will remain  in effect (meaning this test can be used) for the  duration of the COVID-19 declaration under Section 56 4(b)(1) of the Act, 21 U.S.C. section 360bbb-3(b)(1), unless the authorization is terminated or revoked sooner. Performed at [facility], [place] [address] [place], [place] [address]      Radiology Studies:  DG Chest 2 View  Result Date: 12/19/2019 CLINICAL DATA:  Dyspnea. EXAM: CHEST - 2 VIEW COMPARISON:  March 2nd 2021 FINDINGS: Redemonstration of a right internal jugular central vascular catheter port, its tip projecting on the superior vena cavoatrial junction. Similar lung inflation and subtle interstitial edema with worsened pleural fluid and dense consolidation on the left that now obscures the left heart border and continues to obscure the left hemidiaphragm. Spinal dextrocurvature, mild skeletal degenerative change, and aortic knob calcified atherosclerosis are redemonstrated. IMPRESSION: Interval worsened left pleural effusion and consolidation, possibly malignant or bland pleural effusion with compressive atelectasis versus a left pneumonia. Stable central catheter port placement and similar persistent interstitial edema. Aortic calcified atherosclerosis. Electronically Signed   By: Revonda Humphrey   On: 12/19/2019 17:52   CT Angio Chest PE W/Cm &/Or Wo Cm  Result Date: 12/19/2019 CLINICAL DATA:  54 year old female with increased shortness of breath. Metastatic breast  cancer. Recent ultrasound-guided left side thoracentesis. EXAM: CT ANGIOGRAPHY CHEST WITH CONTRAST TECHNIQUE: Multidetector CT imaging of the chest was performed using the standard protocol during bolus administration of intravenous contrast. Multiplanar CT image reconstructions and MIPs were obtained to evaluate the vascular anatomy. CONTRAST:  192mL OMNIPAQUE IOHEXOL 350 MG/ML SOLN COMPARISON:  Post thoracentesis chest radiograph 12/11/2019. Two-view chest radiographs earlier today. CTA chest 10/25/2019. FINDINGS: Cardiovascular: Good contrast bolus timing in the  pulmonary arterial tree. Intermittent respiratory motion. No central or left lung pulmonary embolus. Enhancing compressed left lower lung. Distal right lung branches are obscured by motion, but no convincing pulmonary artery filling defect is identified. Stable and normal cardiac size. No pericardial effusion. Negative visible aorta. Right IJ approach Port-A-Cath now in place. Mediastinum/Nodes: No mediastinal or hilar lymphadenopathy identified. Lungs/Pleura: Large layering and sub pulmonic left pleural effusion with simple fluid density has significantly increased since the thoracentesis on 12/11/2019. But no areas of left pleural nodularity or mass are identified. There is a new trace right pleural effusion. There is increasing peribronchial and confluent right upper lung opacity. Patchy opacity was present here in January. This has an inflammatory appearance. Major airways are patent aside from atelectatic changes and respiratory motion. There is mild diffuse pulmonary septal thickening. Upper Abdomen: Negative visible liver, spleen, pancreas, adrenal glands, kidneys and bowel in the upper abdomen. Musculoskeletal: Diffuse osseous metastatic disease in the visible skeleton. Osteolysis and pathologic fracture of the posterior left 1st rib has increased since January. Review of the MIP images confirms the above findings. IMPRESSION: 1. No acute pulmonary embolus identified. Distal right lung pulmonary branches are degraded by motion. 2. Large layering and subpulmonic left pleural effusion with significant increase since the thoracentesis on 12/11/2019. Left lung compressive atelectasis. No left pleural metastasis identified. 3. Increased dependent right upper lung opacity since January which appears inflammatory. Consider pneumonia, aspiration. 4. Mild pulmonary interstitial edema suspected. Trace new right pleural effusion. 5. Diffuse osseous metastatic disease with some progression since January, including  increased lysis and pathologic fracture of the posterior left 1st rib. Electronically Signed   By: Genevie Ann M.D.   On: 12/19/2019 19:40     Scheduled Meds:   . Chlorhexidine Gluconate Cloth  6 each Topical Daily  . [START ON 12/21/2019] heparin  5,000 Units Subcutaneous Q8H  . lidocaine      . sodium chloride flush  3 mL Intravenous Once    Continuous Infusions:   . ceFEPime (MAXIPIME) IV 2 g (12/20/19 0521)     LOS: 1 day     Vernell Leep, MD, [place], [facility]. [facility]    To contact the attending provider between 7A-7P or the covering provider during after hours 7P-7A, please log into the web site [email or web address] and access using universal [place] password for that web site. If you do not have the password, please call the hospital operator.  12/20/2019, 11:47 AM

## 2019-12-20 NOTE — Consult Note (Signed)
Chief Complaint: Patient was seen in consultation today for malignant left pleural effusion/tunneled left pleurX catheter placement.  Referring Physician(s): Maryanna Shape  Supervising Physician: Aletta Edouard  Patient Status: [facility] - In-pt  History of Present Illness: Abigail [name] is a 54 y.o. female with a past medical history of hyperlipidemia and metastatic breast cancer. She is known to IR- she has undergone multiple thoracentesis procedures with [place] due to recurrent left loculated malignant pleural effusion (most recently 12/11/2019 yielding 750 mL). She presented to [facility] ED 12/19/2019 after being referred by her oncologist for management of dyspnea on exertion. She was admitted for further management. CTA chest revealed a recurrent large layering and subpulmonic left pleural effusion. She was seen inpatient by her oncologist who recommends IR consultation for possible pleurX catheter placement for management of malignant pleural effusion.  CTA chest 12/19/2019: 1. No acute pulmonary embolus identified. Distal right lung pulmonary branches are degraded by motion. 2. Large layering and subpulmonic left pleural effusion with significant increase since the thoracentesis on 12/11/2019. Left lung compressive atelectasis. No left pleural metastasis identified. 3. Increased dependent right upper lung opacity since January which appears inflammatory. Consider pneumonia, aspiration. 4. Mild pulmonary interstitial edema suspected. Trace new right pleural effusion. 5. Diffuse osseous metastatic disease with some progression since January, including increased lysis and pathologic fracture of the posterior left 1st rib.  IR requested by Mikey Bussing, NP for possible image-guided tunneled left pleurX catheter placement. Patient awake and alert sitting in bed. Complains of dyspnea, stable since admission. Denies fever, chills, chest pain, abdominal pain, or headache.   Past Medical History:    Diagnosis Date  . Allergic rhinitis   . Hyperlipidemia    LDL 150's '11    Past Surgical History:  Procedure Laterality Date  . CESAREAN SECTION    . IR IMAGING GUIDED PORT INSERTION  11/07/2019  . IR THORACENTESIS ASP PLEURAL SPACE W/IMG GUIDE  11/09/2019  . IR US GUIDE BX ASP/DRAIN  10/29/2019    Allergies: Shellfish allergy and Sulfamethoxazole  Medications: Prior to Admission medications   Medication Sig Start Date End Date Taking? Authorizing Provider  enoxaparin (LOVENOX) 120 MG/0.8ML injection Inject 0.73 mLs (110 mg total) into the skin daily. 11/08/19  Yes Owens Shark, NP  guaifenesin (ROBITUSSIN) 100 MG/5ML syrup Take 200 mg by mouth 3 (three) times daily as needed for cough.   Yes [provider]  HYDROcodone-acetaminophen (NORCO/VICODIN) 5-325 MG tablet Take 1 tablet by mouth every 8 (eight) hours as needed for moderate pain or severe pain. 12/04/19  Yes Owens Shark, NP  lidocaine-prilocaine (EMLA) cream Apply 1 application topically as directed. Apply to port 1 hour prior to stick and cover with plastic wrap 11/02/19  Yes Ladell Pier, MD  ondansetron (ZOFRAN) 8 MG tablet Take 1 tablet (8 mg total) by mouth every 8 (eight) hours as needed for nausea or vomiting. 11/02/19  Yes Ladell Pier, MD  potassium chloride SA (KLOR-CON) 20 MEQ tablet Take 1 tablet (20 mEq total) by mouth daily. 12/12/19  Yes Ladell Pier, MD  ciprofloxacin (CIPRO) 500 MG tablet Take 1 tablet (500 mg total) by mouth 2 (two) times daily for 7 days. 12/19/19 12/26/19  Owens Shark, NP  methocarbamol (ROBAXIN) 500 MG tablet Take 500 mg by mouth 4 (four) times daily. 10/18/19   [provider]  prochlorperazine (COMPAZINE) 10 MG tablet Take 1 tablet (10 mg total) by mouth every 6 (six) hours as needed for nausea.  Patient not taking: Reported on 12/19/2019 11/02/19   Ladell Pier, MD     Family History  Problem Relation Age of Onset  . Diabetes Mother   . Hypertension  Mother   . Hypertension Father   . Hypertension Sister   . Heart disease Sister        MI - no serious damage to heart  . Cancer Neg Hx     Social History   Socioeconomic History  . Marital status: Single    Spouse name: Not on file  . Number of children: 3  . Years of education: 53  . Highest education level: Not on file  Occupational History  . Occupation: Armed forces training and education officer  Tobacco Use  . Smoking status: Never Smoker  . Smokeless tobacco: Never Used  Substance and Sexual Activity  . Alcohol use: Yes    Comment: rare use - twice rare  . Drug use: No  . Sexual activity: Yes    Partners: Male  Other Topics Concern  . Not on file  Social History Narrative   HSG, [employer]. Married '04, 1 dtr - '05, raising niece and nephew. Work - Chief Strategy Officer. Marriage in good. No history of abuse. Working on [employer] education at [employer] (oct '12)   Social Determinants of Superior Strain:   . Difficulty of Paying Living Expenses:   Food Insecurity:   . Worried About Charity fundraiser in the Last Year:   . Arboriculturist in the Last Year:   Transportation Needs:   . Film/video editor (Medical):   Marland Kitchen Lack of Transportation (Non-Medical):   Physical Activity:   . Days of Exercise per Week:   . Minutes of Exercise per Session:   Stress:   . Feeling of Stress :   Social Connections:   . Frequency of Communication with Friends and Family:   . Frequency of Social Gatherings with Friends and Family:   . Attends Religious Services:   . Active Member of Clubs or Organizations:   . Attends Archivist Meetings:   Marland Kitchen Marital Status:      Review of Systems: A 12 point ROS discussed and pertinent positives are indicated in the HPI above.  All other systems are negative.  Review of Systems  Constitutional: Negative for chills and fever.  Respiratory: Positive for shortness of breath. Negative for wheezing.   Cardiovascular:  Negative for chest pain and palpitations.  Gastrointestinal: Negative for abdominal pain.  Neurological: Negative for headaches.  Psychiatric/Behavioral: Negative for behavioral problems and confusion.    Vital Signs: BP (!) 138/94 (BP Location: Right Arm)   Pulse 99   Temp 98.3 F (36.8 C) (Oral)   Resp (!) 22   Ht 5\' 1"  (1.549 m)   Wt 166 lb (75.3 kg)   SpO2 97%   BMI 31.37 kg/m   Physical Exam Vitals and nursing note reviewed.  Constitutional:      General: She is not in acute distress.    Appearance: Normal appearance.  Cardiovascular:     Rate and Rhythm: Regular rhythm. Tachycardia present.     Heart sounds: Normal heart sounds. No murmur.  Pulmonary:     Effort: Pulmonary effort is normal. No respiratory distress.     Breath sounds: Normal breath sounds. No wheezing.  Skin:    General: Skin is warm and dry.  Neurological:     Mental Status: She is alert and oriented to  person, place, and time.  Psychiatric:        Mood and Affect: Mood normal.        Behavior: Behavior normal.      MD Evaluation Airway: WNL Heart: WNL Abdomen: WNL Chest/ Lungs: WNL ASA  Classification: 3 Mallampati/Airway Score: One   Imaging: DG Chest 1 View  Result Date: 12/11/2019 CLINICAL DATA:  54 year old female status post ultrasound-guided left side thoracentesis this afternoon. Metastatic breast cancer. EXAM: CHEST  1 VIEW COMPARISON:  Chest radiographs 11/28/2019 and earlier. FINDINGS: PA view at 1318 hours. No pneumothorax. Residual left side pleural effusion similar to that last month. Small or trace right pleural effusion also now suspected. Stable right chest power port. Stable cardiac size and mediastinal contours. Stable nonspecific bilateral pulmonary reticulonodular opacity. Abnormal bone mineralization. The left glenohumeral joint has become dislocated since January. Negative visible bowel gas pattern. IMPRESSION: 1. No pneumothorax following Left side thoracentesis. Small  left greater than right pleural effusions. 2. Osseous metastatic disease with dislocated left glenohumeral joint since January. 3. Stable nonspecific pulmonary interstitial opacity. Electronically Signed   By: Genevie Ann M.D.   On: 12/11/2019 15:28   DG Chest 1 View  Result Date: 11/28/2019 CLINICAL DATA:  54 year old female status post ultrasound-guided thoracentesis of malignant left pleural effusion. Breast cancer. EXAM: CHEST  1 VIEW COMPARISON:  PA chest 11/09/2019 and earlier. FINDINGS: PA view at 1622 hours. Small residual left pleural effusion, appears larger since January. No pneumothorax. Stable mediastinal contours. Coarse bilateral pulmonary interstitial opacity is stable. Stable right chest power port. Stable visualized osseous structures. Negative visible bowel gas pattern. IMPRESSION: No pneumothorax following left side thoracentesis. Residual small volume effusion. Electronically Signed   By: Genevie Ann M.D.   On: 11/28/2019 16:31   DG Chest 2 View  Result Date: 12/19/2019 CLINICAL DATA:  Dyspnea. EXAM: CHEST - 2 VIEW COMPARISON:  March 2nd 2021 FINDINGS: Redemonstration of a right internal jugular central vascular catheter port, its tip projecting on the superior vena cavoatrial junction. Similar lung inflation and subtle interstitial edema with worsened pleural fluid and dense consolidation on the left that now obscures the left heart border and continues to obscure the left hemidiaphragm. Spinal dextrocurvature, mild skeletal degenerative change, and aortic knob calcified atherosclerosis are redemonstrated. IMPRESSION: Interval worsened left pleural effusion and consolidation, possibly malignant or bland pleural effusion with compressive atelectasis versus a left pneumonia. Stable central catheter port placement and similar persistent interstitial edema. Aortic calcified atherosclerosis. Electronically Signed   By: Revonda Humphrey   On: 12/19/2019 17:52   CT Angio Chest PE W/Cm &/Or Wo  Cm  Result Date: 12/19/2019 CLINICAL DATA:  54 year old female with increased shortness of breath. Metastatic breast cancer. Recent ultrasound-guided left side thoracentesis. EXAM: CT ANGIOGRAPHY CHEST WITH CONTRAST TECHNIQUE: Multidetector CT imaging of the chest was performed using the standard protocol during bolus administration of intravenous contrast. Multiplanar CT image reconstructions and MIPs were obtained to evaluate the vascular anatomy. CONTRAST:  189mL OMNIPAQUE IOHEXOL 350 MG/ML SOLN COMPARISON:  Post thoracentesis chest radiograph 12/11/2019. Two-view chest radiographs earlier today. CTA chest 10/25/2019. FINDINGS: Cardiovascular: Good contrast bolus timing in the pulmonary arterial tree. Intermittent respiratory motion. No central or left lung pulmonary embolus. Enhancing compressed left lower lung. Distal right lung branches are obscured by motion, but no convincing pulmonary artery filling defect is identified. Stable and normal cardiac size. No pericardial effusion. Negative visible aorta. Right IJ approach Port-A-Cath now in place. Mediastinum/Nodes: No mediastinal or hilar lymphadenopathy identified. Lungs/Pleura:  Large layering and sub pulmonic left pleural effusion with simple fluid density has significantly increased since the thoracentesis on 12/11/2019. But no areas of left pleural nodularity or mass are identified. There is a new trace right pleural effusion. There is increasing peribronchial and confluent right upper lung opacity. Patchy opacity was present here in January. This has an inflammatory appearance. Major airways are patent aside from atelectatic changes and respiratory motion. There is mild diffuse pulmonary septal thickening. Upper Abdomen: Negative visible liver, spleen, pancreas, adrenal glands, kidneys and bowel in the upper abdomen. Musculoskeletal: Diffuse osseous metastatic disease in the visible skeleton. Osteolysis and pathologic fracture of the posterior left 1st  rib has increased since January. Review of the MIP images confirms the above findings. IMPRESSION: 1. No acute pulmonary embolus identified. Distal right lung pulmonary branches are degraded by motion. 2. Large layering and subpulmonic left pleural effusion with significant increase since the thoracentesis on 12/11/2019. Left lung compressive atelectasis. No left pleural metastasis identified. 3. Increased dependent right upper lung opacity since January which appears inflammatory. Consider pneumonia, aspiration. 4. Mild pulmonary interstitial edema suspected. Trace new right pleural effusion. 5. Diffuse osseous metastatic disease with some progression since January, including increased lysis and pathologic fracture of the posterior left 1st rib. Electronically Signed   By: Genevie Ann M.D.   On: 12/19/2019 19:40   US Thoracentesis Asp Pleural space w/IMG guide  Result Date: 12/11/2019 INDICATION: Patient with history of metastatic breast cancer, recurrent symptomatic left malignant pleural effusion. Request to IR for therapeutic left thoracentesis. EXAM: ULTRASOUND GUIDED LEFT THORACENTESIS MEDICATIONS: 10 mL 1% lidocaine COMPLICATIONS: None immediate. PROCEDURE: An ultrasound guided thoracentesis was thoroughly discussed with the patient and questions answered. The benefits, risks, alternatives and complications were also discussed. The patient understands and wishes to proceed with the procedure. Written consent was obtained. Ultrasound was performed to localize and mark an adequate pocket of fluid in the left chest. The area was then prepped and draped in the normal sterile fashion. 1% Lidocaine was used for local anesthesia. Under ultrasound guidance a 6 Fr Safe-T-Centesis catheter was introduced. Thoracentesis was performed. The catheter was removed and a dressing applied. FINDINGS: A total of approximately 750 mL of clear yellow fluid was removed. IMPRESSION: Successful ultrasound guided left thoracentesis  yielding 750 mL of pleural fluid. Read by Candiss Norse, PA-C Electronically Signed   By: Markus Daft M.D.   On: 12/11/2019 16:16   US THORACENTESIS ASP PLEURAL SPACE W/IMG GUIDE  Result Date: 11/28/2019 INDICATION: Patient with history of metastatic breast cancer, dyspnea, recurrent left pleural effusion. Request made for therapeutic left thoracentesis. EXAM: ULTRASOUND GUIDED THERAPEUTIC LEFT THORACENTESIS MEDICATIONS: None COMPLICATIONS: None immediate. PROCEDURE: An ultrasound guided thoracentesis was thoroughly discussed with the patient and questions answered. The benefits, risks, alternatives and complications were also discussed. The patient understands and wishes to proceed with the procedure. Written consent was obtained. Ultrasound was performed to localize and mark an adequate pocket of fluid in the left chest. The area was then prepped and draped in the normal sterile fashion. 1% Lidocaine was used for local anesthesia. Under ultrasound guidance a 6 Fr Safe-T-Centesis catheter was introduced. Thoracentesis was performed. The catheter was removed and a dressing applied. FINDINGS: A total of approximately 700 cc of yellow fluid was removed. Due to patient coughing and chest discomfort only the above amount of fluid was removed today. IMPRESSION: Successful ultrasound guided therapeutic left thoracentesis yielding 700 cc of pleural fluid. Read by: Rowe Robert, PA-C Electronically Signed  By: Sandi Mariscal M.D.   On: 11/28/2019 16:19    Labs:  CBC: Recent Labs    12/12/19 0835 12/19/19 0851 12/19/19 1702 12/20/19 0451  WBC 7.2 0.6* 0.7* 0.7*  HGB 7.9* 7.2* 10.3* 9.9*  HCT 25.8* 24.4* 33.5* 31.9*  PLT 267 119* 135* 110*    COAGS: Recent Labs    10/28/19 1420 10/28/19 2114 10/29/19 0738 10/29/19 1016 10/30/19 0555  INR  --   --   --  1.1  --   APTT 59* 52* 109*  --  66*    BMP: Recent Labs    11/28/19 0844 12/12/19 0835 12/19/19 1702 12/20/19 0451  NA 142 140 140  140  K 3.6 3.2* 3.9 3.8  CL 107 102 104 105  CO2 26 28 24 27   GLUCOSE 117* 105* 92 92  BUN 8 8 9 7   CALCIUM 9.2 8.9 8.6* 8.5*  CREATININE 0.68 0.66 0.36* 0.38*  GFRNONAA >60 >60 >60 >60  GFRAA >60 >60 >60 >60    LIVER FUNCTION TESTS: Recent Labs    11/21/19 0820 11/28/19 0844 12/12/19 0835 12/20/19 0451  BILITOT 0.3 0.2* 0.4 0.8  AST 61* 40 23 15  ALT 75* 69* 31 20  ALKPHOS 195* 260* 347* 205*  PROT 6.8 6.6 6.4* 6.1*  ALBUMIN 2.7* 2.8* 2.8* 2.7*     Assessment and Plan:  Recurrent left loculated malignant pleural effusion in the setting of metastatic breast cancer, requiring multiple thoracentesis (most recently 12/11/2019 yielding 750 mL). Plan for image-guided tunneled left pleurX catheter placement tentatively for today in IR. Patient is NPO. Afebrile. Heparin held per IR protocol.  IR also requested by Dr. Jonelle Sidle for possible image-guided thoracentesis. Will delete order, as patient is getting pleurX catheter today.  Risks and benefits discussed with the patient including bleeding, infection, damage to adjacent structures, malfunction of the catheter with need for additional procedures. All of the patient's questions were answered, patient is agreeable to proceed. Consent signed and in chart.   Thank you for this interesting consult.  I greatly enjoyed meeting Khristine Vereb and look forward to participating in their care.  A copy of this report was sent to the requesting provider on this date.  Electronically Signed: Earley Abide, PA-C 12/20/2019, 10:23 AM   I spent a total of 40 Minutes in face to face in clinical consultation, greater than 50% of which was counseling/coordinating care for malignant left pleural effusion/tunneled left pleurX catheter placement.

## 2019-12-20 NOTE — Progress Notes (Signed)
Pharmacy Antibiotic Note  Elmarie Wehbe is a 54 y.o. female admitted on 12/19/2019 with HCAP.  Pharmacy has been consulted for Cefepime dosing.  Plan: Cefepime 2gm iv q8hr  Height: 5\' 1"  (154.9 cm) Weight: 166 lb (75.3 kg) IBW/kg (Calculated) : 47.8  Temp (24hrs), Avg:98.1 F (36.7 C), Min:97.2 F (36.2 C), Max:98.5 F (36.9 C)  Recent Labs  Lab 12/19/19 0851 12/19/19 1702 12/19/19 2006  WBC 0.6* 0.7*  --   CREATININE  --  0.36*  --   LATICACIDVEN  --   --  1.1    Estimated Creatinine Clearance: 75.5 mL/min (A) (by C-G formula based on SCr of 0.36 mg/dL (L)).    Allergies  Allergen Reactions  . Shellfish Allergy Itching and Swelling  . Sulfamethoxazole     REACTION: swelling    Antimicrobials this admission: Vancomycin 12/19/2019 x1 Cefepime 12/19/2019 >>   Dose adjustments this admission: -  Microbiology results: -  Thank you for allowing pharmacy to be a part of this patient's care.  Nani Skillern Crowford 12/20/2019 12:36 AM

## 2019-12-21 ENCOUNTER — Telehealth: Payer: Self-pay | Admitting: Oncology

## 2019-12-21 LAB — CBC WITH DIFFERENTIAL/PLATELET
Abs Immature Granulocytes: 0 10*3/uL (ref 0.00–0.07)
Band Neutrophils: 8 %
Basophils Absolute: 0 10*3/uL (ref 0.0–0.1)
Basophils Relative: 0 %
Eosinophils Absolute: 0 10*3/uL (ref 0.0–0.5)
Eosinophils Relative: 2 %
HCT: 30 % — ABNORMAL LOW (ref 36.0–46.0)
Hemoglobin: 9.3 g/dL — ABNORMAL LOW (ref 12.0–15.0)
Lymphocytes Relative: 47 %
Lymphs Abs: 0.3 10*3/uL — ABNORMAL LOW (ref 0.7–4.0)
MCH: 28.5 pg (ref 26.0–34.0)
MCHC: 31 g/dL (ref 30.0–36.0)
MCV: 92 fL (ref 80.0–100.0)
Monocytes Absolute: 0.1 10*3/uL (ref 0.1–1.0)
Monocytes Relative: 9 %
Myelocytes: 5 %
Neutro Abs: 0.2 10*3/uL — ABNORMAL LOW (ref 1.7–7.7)
Neutrophils Relative %: 24 %
Other: 3 %
Platelets: 71 10*3/uL — ABNORMAL LOW (ref 150–400)
Promyelocytes Relative: 2 %
RBC: 3.26 MIL/uL — ABNORMAL LOW (ref 3.87–5.11)
RDW: 16.2 % — ABNORMAL HIGH (ref 11.5–15.5)
WBC: 0.7 10*3/uL — CL (ref 4.0–10.5)
nRBC: 7.5 % — ABNORMAL HIGH (ref 0.0–0.2)

## 2019-12-21 LAB — BASIC METABOLIC PANEL
Anion gap: 9 (ref 5–15)
BUN: 12 mg/dL (ref 6–20)
CO2: 27 mmol/L (ref 22–32)
Calcium: 8.2 mg/dL — ABNORMAL LOW (ref 8.9–10.3)
Chloride: 104 mmol/L (ref 98–111)
Creatinine, Ser: 0.5 mg/dL (ref 0.44–1.00)
GFR calc Af Amer: >60 mL/min (ref >60)
GFR calc non Af Amer: >60 mL/min (ref >60)
Glucose, Bld: 100 mg/dL — ABNORMAL HIGH (ref 70–99)
Potassium: 3.5 mmol/L (ref 3.5–5.1)
Sodium: 140 mmol/L (ref 135–145)

## 2019-12-21 LAB — PATHOLOGIST SMEAR REVIEW

## 2019-12-21 NOTE — Progress Notes (Addendum)
HEMATOLOGY-ONCOLOGY PROGRESS NOTE  SUBJECTIVE: The patient's breathing is much better today.  She is up in the recliner chair.  Denies shortness of breath or chest pain.  Still has swelling in her left arm and hand.  The left hand is causing her some discomfort.  She remains afebrile.  Oncology History  Breast cancer ([facility])  11/02/2019 Initial Diagnosis   Breast cancer ([place])   11/02/2019 Cancer Staging   Staging form: Breast, AJCC 8th Edition - Clinical: Stage IV (cT4, cN2a, pM1, ER-, PR-, HER2-) - Signed by Ladell Pier, MD on 11/02/2019   11/14/2019 - 12/11/2019 Chemotherapy   The patient had PACLitaxel-protein bound (ABRAXANE) chemo infusion 175 mg, 100 mg/m2 = 175 mg, Intravenous,  Once, 1 of 4 cycles Administration: 175 mg (11/14/2019), 175 mg (11/21/2019), 175 mg (11/28/2019)  for chemotherapy treatment.    12/12/2019 -  Chemotherapy   The patient had DOXOrubicin (ADRIAMYCIN) chemo injection 110 mg, 60 mg/m2 = 110 mg, Intravenous,  Once, 1 of 4 cycles Administration: 110 mg (12/12/2019) palonosetron (ALOXI) injection 0.25 mg, 0.25 mg, Intravenous,  Once, 1 of 4 cycles Administration: 0.25 mg (12/12/2019) pegfilgrastim-cbqv (UDENYCA) injection 6 mg, 6 mg, Subcutaneous, Once, 1 of 4 cycles Administration: 6 mg (12/14/2019) cyclophosphamide (CYTOXAN) 1,100 mg in sodium chloride 0.9 % 250 mL chemo infusion, 600 mg/m2 = 1,100 mg, Intravenous,  Once, 1 of 4 cycles Administration: 1,100 mg (12/12/2019) fosaprepitant (EMEND) 150 mg in sodium chloride 0.9 % 145 mL IVPB, 150 mg, Intravenous,  Once, 1 of 4 cycles Administration: 150 mg (12/12/2019)  for chemotherapy treatment.     PHYSICAL EXAMINATION:  Vitals:   12/21/19 0608 12/21/19 0644  BP: 139/80   Pulse: (!) 108 (!) 102  Resp: (!) 22   Temp: 98.3 F (36.8 C)   SpO2: 97%    Filed Weights   12/19/19 2317  Weight: 75.3 kg    Intake/Output from previous day: 03/11 0701 - 03/12 0700 In: 80 [P.O.:120; IV Piggyback:300] Out: 550  [Urine:550]  GENERAL:alert, no distress and comfortable OROPHARYNX: No thrush or mucositis LUNGS: Clear on the right, diminished left lung base, left posterior chest dressing without drainage HEART: regular rate & rhythm and no murmurs and no lower extremity edema, left upper extremity with persistent edema. ABDOMEN:abdomen soft, non-tender and normal bowel sounds SKIN: Persistent thickening at the left upper anterior chest and back, improved nodularity at the left axilla NEURO: alert & oriented x 3 with fluent speech, no focal motor/sensory deficits  Port-A-Cath without erythema  LABORATORY DATA:  I have reviewed the data as listed CMP Latest Ref Rng & Units 12/21/2019 12/20/2019 12/19/2019  Glucose 70 - 99 mg/dL 100(H) 92 92  BUN 6 - 20 mg/dL '12 7 9  ' Creatinine 0.44 - 1.00 mg/dL 0.50 0.38(L) 0.36(L)  Sodium 135 - 145 mmol/L 140 140 140  Potassium 3.5 - 5.1 mmol/L 3.5 3.8 3.9  Chloride 98 - 111 mmol/L 104 105 104  CO2 22 - 32 mmol/L '27 27 24  ' Calcium 8.9 - 10.3 mg/dL 8.2(L) 8.5(L) 8.6(L)  Total Protein 6.5 - 8.1 g/dL - 6.1(L) -  Total Bilirubin 0.3 - 1.2 mg/dL - 0.8 -  Alkaline Phos 38 - 126 U/L - 205(H) -  AST 15 - 41 U/L - 15 -  ALT 0 - 44 U/L - 20 -    Lab Results  Component Value Date   WBC 0.7 (LL) 12/21/2019   HGB 9.3 (L) 12/21/2019   HCT 30.0 (L) 12/21/2019   MCV 92.0  12/21/2019   PLT 71 (L) 12/21/2019   NEUTROABS 0.2 (L) 12/21/2019    DG Chest 1 View  Result Date: 12/11/2019 CLINICAL DATA:  54 year old female status post ultrasound-guided left side thoracentesis this afternoon. Metastatic breast cancer. EXAM: CHEST  1 VIEW COMPARISON:  Chest radiographs 11/28/2019 and earlier. FINDINGS: PA view at 1318 hours. No pneumothorax. Residual left side pleural effusion similar to that last month. Small or trace right pleural effusion also now suspected. Stable right chest power port. Stable cardiac size and mediastinal contours. Stable nonspecific bilateral pulmonary  reticulonodular opacity. Abnormal bone mineralization. The left glenohumeral joint has become dislocated since January. Negative visible bowel gas pattern. IMPRESSION: 1. No pneumothorax following Left side thoracentesis. Small left greater than right pleural effusions. 2. Osseous metastatic disease with dislocated left glenohumeral joint since January. 3. Stable nonspecific pulmonary interstitial opacity. Electronically Signed   By: Genevie Ann M.D.   On: 12/11/2019 15:28   DG Chest 1 View  Result Date: 11/28/2019 CLINICAL DATA:  54 year old female status post ultrasound-guided thoracentesis of malignant left pleural effusion. Breast cancer. EXAM: CHEST  1 VIEW COMPARISON:  PA chest 11/09/2019 and earlier. FINDINGS: PA view at 1622 hours. Small residual left pleural effusion, appears larger since January. No pneumothorax. Stable mediastinal contours. Coarse bilateral pulmonary interstitial opacity is stable. Stable right chest power port. Stable visualized osseous structures. Negative visible bowel gas pattern. IMPRESSION: No pneumothorax following left side thoracentesis. Residual small volume effusion. Electronically Signed   By: Genevie Ann M.D.   On: 11/28/2019 16:31   DG Chest 2 View  Result Date: 12/19/2019 CLINICAL DATA:  Dyspnea. EXAM: CHEST - 2 VIEW COMPARISON:  March 2nd 2021 FINDINGS: Redemonstration of a right internal jugular central vascular catheter port, its tip projecting on the superior vena cavoatrial junction. Similar lung inflation and subtle interstitial edema with worsened pleural fluid and dense consolidation on the left that now obscures the left heart border and continues to obscure the left hemidiaphragm. Spinal dextrocurvature, mild skeletal degenerative change, and aortic knob calcified atherosclerosis are redemonstrated. IMPRESSION: Interval worsened left pleural effusion and consolidation, possibly malignant or bland pleural effusion with compressive atelectasis versus a left  pneumonia. Stable central catheter port placement and similar persistent interstitial edema. Aortic calcified atherosclerosis. Electronically Signed   By: Revonda Humphrey   On: 12/19/2019 17:52   CT Angio Chest PE W/Cm &/Or Wo Cm  Result Date: 12/19/2019 CLINICAL DATA:  54 year old female with increased shortness of breath. Metastatic breast cancer. Recent ultrasound-guided left side thoracentesis. EXAM: CT ANGIOGRAPHY CHEST WITH CONTRAST TECHNIQUE: Multidetector CT imaging of the chest was performed using the standard protocol during bolus administration of intravenous contrast. Multiplanar CT image reconstructions and MIPs were obtained to evaluate the vascular anatomy. CONTRAST:  167m OMNIPAQUE IOHEXOL 350 MG/ML SOLN COMPARISON:  Post thoracentesis chest radiograph 12/11/2019. Two-view chest radiographs earlier today. CTA chest 10/25/2019. FINDINGS: Cardiovascular: Good contrast bolus timing in the pulmonary arterial tree. Intermittent respiratory motion. No central or left lung pulmonary embolus. Enhancing compressed left lower lung. Distal right lung branches are obscured by motion, but no convincing pulmonary artery filling defect is identified. Stable and normal cardiac size. No pericardial effusion. Negative visible aorta. Right IJ approach Port-A-Cath now in place. Mediastinum/Nodes: No mediastinal or hilar lymphadenopathy identified. Lungs/Pleura: Large layering and sub pulmonic left pleural effusion with simple fluid density has significantly increased since the thoracentesis on 12/11/2019. But no areas of left pleural nodularity or mass are identified. There is a new trace right pleural  effusion. There is increasing peribronchial and confluent right upper lung opacity. Patchy opacity was present here in January. This has an inflammatory appearance. Major airways are patent aside from atelectatic changes and respiratory motion. There is mild diffuse pulmonary septal thickening. Upper Abdomen: Negative  visible liver, spleen, pancreas, adrenal glands, kidneys and bowel in the upper abdomen. Musculoskeletal: Diffuse osseous metastatic disease in the visible skeleton. Osteolysis and pathologic fracture of the posterior left 1st rib has increased since January. Review of the MIP images confirms the above findings. IMPRESSION: 1. No acute pulmonary embolus identified. Distal right lung pulmonary branches are degraded by motion. 2. Large layering and subpulmonic left pleural effusion with significant increase since the thoracentesis on 12/11/2019. Left lung compressive atelectasis. No left pleural metastasis identified. 3. Increased dependent right upper lung opacity since January which appears inflammatory. Consider pneumonia, aspiration. 4. Mild pulmonary interstitial edema suspected. Trace new right pleural effusion. 5. Diffuse osseous metastatic disease with some progression since January, including increased lysis and pathologic fracture of the posterior left 1st rib. Electronically Signed   By: Genevie Ann M.D.   On: 12/19/2019 19:40   IR PERC PLEURAL DRAIN W/INDWELL CATH W/IMG GUIDE  Result Date: 12/20/2019 CLINICAL DATA:  Recurrent malignant left pleural effusion from metastatic breast carcinoma. EXAM: INSERTION OF TUNNELED PLEURAL DRAINAGE CATHETER ANESTHESIA/SEDATION: 2.0 mg IV Versed; 100 mcg IV Fentanyl. Total Moderate Sedation Time 24 minutes. The patient's level of consciousness and physiologic status were continuously monitored during the procedure by Radiology nursing. MEDICATIONS: 2 g IV Ancef. Antibiotic was administered in an appropriate time interval for the procedure. FLUOROSCOPY TIME:  24 seconds.  63 mGy. PROCEDURE: The procedure, risks, benefits, and alternatives were explained to the patient. Questions regarding the procedure were encouraged and answered. The patient understands and consents to the procedure. A time-out was performed prior to initiating the procedure. The left chest wall was  prepped with chlorhexidine in a sterile fashion, and a sterile drape was applied covering the operative field. A sterile gown and sterile gloves were used for the procedure. Local anesthesia was provided with 1% Lidocaine. Ultrasound image documentation was performed. Fluoroscopy during the procedure and fluoroscopic spot radiograph confirms appropriate catheter position. After creating a small skin incision, a 19 gauge needle was advanced into the pleural cavity under ultrasound guidance. A guide wire was then advanced under fluoroscopy into the pleural space. Pleural access was dilated serially and a 16-French peel-away sheath placed. A 15.5 French tunneled PleurX catheter was placed. This was tunneled from an incision 5 cm below the pleural access to the access site. The catheter was advanced through the peel-away sheath. The sheath was then removed. Final catheter positioning was confirmed with a fluoroscopic spot image. The access incision was closed with subcuticular 4-0 Vicryl. Dermabond was applied to the incision. A Prolene retention suture was applied at the catheter exit site. Large volume thoracentesis was performed through the new catheter utilizing a vacuum bottle. COMPLICATIONS: None. FINDINGS: The catheter was placed via the left posterolateral chest wall. Approximately 1 L of bloody pleural fluid was able to be removed after catheter placement. IMPRESSION: Placement of tunneled left PleurX drainage catheter into the left pleural space. 1 L of bloody pleural fluid was removed today after catheter placement. Electronically Signed   By: Aletta Edouard M.D.   On: 12/20/2019 14:04   US Thoracentesis Asp Pleural space w/IMG guide  Result Date: 12/11/2019 INDICATION: Patient with history of metastatic breast cancer, recurrent symptomatic left malignant pleural effusion. Request  to IR for therapeutic left thoracentesis. EXAM: ULTRASOUND GUIDED LEFT THORACENTESIS MEDICATIONS: 10 mL 1% lidocaine  COMPLICATIONS: None immediate. PROCEDURE: An ultrasound guided thoracentesis was thoroughly discussed with the patient and questions answered. The benefits, risks, alternatives and complications were also discussed. The patient understands and wishes to proceed with the procedure. Written consent was obtained. Ultrasound was performed to localize and mark an adequate pocket of fluid in the left chest. The area was then prepped and draped in the normal sterile fashion. 1% Lidocaine was used for local anesthesia. Under ultrasound guidance a 6 Fr Safe-T-Centesis catheter was introduced. Thoracentesis was performed. The catheter was removed and a dressing applied. FINDINGS: A total of approximately 750 mL of clear yellow fluid was removed. IMPRESSION: Successful ultrasound guided left thoracentesis yielding 750 mL of pleural fluid. Read by Candiss Norse, PA-C Electronically Signed   By: Markus Daft M.D.   On: 12/11/2019 16:16   US THORACENTESIS ASP PLEURAL SPACE W/IMG GUIDE  Result Date: 11/28/2019 INDICATION: Patient with history of metastatic breast cancer, dyspnea, recurrent left pleural effusion. Request made for therapeutic left thoracentesis. EXAM: ULTRASOUND GUIDED THERAPEUTIC LEFT THORACENTESIS MEDICATIONS: None COMPLICATIONS: None immediate. PROCEDURE: An ultrasound guided thoracentesis was thoroughly discussed with the patient and questions answered. The benefits, risks, alternatives and complications were also discussed. The patient understands and wishes to proceed with the procedure. Written consent was obtained. Ultrasound was performed to localize and mark an adequate pocket of fluid in the left chest. The area was then prepped and draped in the normal sterile fashion. 1% Lidocaine was used for local anesthesia. Under ultrasound guidance a 6 Fr Safe-T-Centesis catheter was introduced. Thoracentesis was performed. The catheter was removed and a dressing applied. FINDINGS: A total of approximately  700 cc of yellow fluid was removed. Due to patient coughing and chest discomfort only the above amount of fluid was removed today. IMPRESSION: Successful ultrasound guided therapeutic left thoracentesis yielding 700 cc of pleural fluid. Read by: Rowe Robert, PA-C Electronically Signed   By: Sandi Mariscal M.D.   On: 11/28/2019 16:19    ASSESSMENT AND PLAN: 1.Metastatic breast cancer  CT chest 10/25/2019-left breast mass left axillary lymph nodes, abnormal appearance of the left concerning for tumor involvement, mediastinal adenopathy, diffuse sclerotic and lytic lesions, left pleural effusion  CT abdomen/pelvis 10/26/2019 enhancing left breast mass with left axillary lymphadenopathy and tumor involved the left chest wall the latissimus dorsi segment 4B liver lesion, diffuse lytic/sclerotic lesions  Ultrasound-guided biopsy of left liver lesion 10/29/1999-metastatic carcinoma, cytokeratin andGATA3positive consistent with metastatic breast cancer. HER-2 negative, ER negative, PR negative, Ki-[id number]%. PD-L1 <1% expression   [employer] hereditary panel negative  Initiated systemic chemotherapy weekly abraxane 100 mg/m2 days 1, 8, 15 q28 days   Cycle 1 day 1 on 11/14/19  Cycle 1 day 8 on 11/21/19 (hg 7.5, receive 1 unit RBCs)  Cycle 1 day 15 on 11/28/2019  Cycle 1 Adriamycin/Cytoxan 12/12/2019 2. Left upper extremity DVT-left subclavian, axillary, and brachial vein thrombosis confirmed on  Anticoagulation with apixaban, changed to parenteral hospital admission1/14/2021transition to twice daily Lovenox at discharge; transition to once daily 11/08/2019. 3.Anemia-likely secondary to metastatic breast cancer involving the bone marrow 4.Left pleural effusion-thoracentesis 11/09/2019, cytology positive for malignant cells 5.Port-A-Cath placement1/27/2021, interventional radiology 6.2D echo 11/08/2019-ejection fraction 55 to 60%, no pericardial effusion 7.Tachycardia-likely multifactorial  secondary to cancer burden, anemia, pleural effusion-improved 8.  Neutropenia secondary to chemotherapy 12/19/2019.  Prophylactic ciprofloxacin initiated. [facility] admission 12/19/2019-acute respiratory failure with hypoxia, large left pleural effusion, ?  HCAP, pancytopenia  Abigail [name] appears improved.  She is now at day 10 of her first cycle of Adriamycin and Cytoxan.  She is on IV antibiotics for possible pneumonia.  She remains afebrile.  She is pancytopenic secondary to recent chemotherapy.  She has persistent lymphedema to the left arm.  Recommendations: 1.  The patient will need teaching regarding drainage of her Pleurx catheter.  Please ask TOC team to arrange for home health to provide supplies. 2.  The patient already received Neulasta with her chemotherapy.  Monitor CBC closely and if her white blood cell count starts to improve over the weekend, she may be discharged to home.  Recommend sending her home with a course of Levaquin to cover for possible pneumonia.  Please call medical oncology over the weekend for questions.  We will see her again on 12/24/2019 if she is still admitted.  She has a follow-up appointment already scheduled on 12/26/2019.   LOS: 2 days   Mikey Bussing, DNP, AGPCNP-BC, AOCNP 12/21/19  Abigail [name] was interviewed and examined.  The dyspnea has improved following placement of the Pleurx catheter.  She has persistent severe neutropenia.  I would continue IV antibiotics until the neutrophil count is up to 500.  Hopefully the neutrophils and platelets will improve over the next few days.  I recommend continuing Levaquin for a total of 7 days antibiotics at discharge.  Outpatient follow-up is scheduled at the Cancer center on 12/26/2019  I appreciate the care from Dr. Algis Liming.

## 2019-12-21 NOTE — Telephone Encounter (Signed)
Did not schedule per 3/10 los. Patient in hospital. Spoke with Benay Spice. Confirmed appt not needed per requested los.

## 2019-12-21 NOTE — Progress Notes (Signed)
PROGRESS NOTE   Abigail [name]  [id number]    DOB: 02/19/1966    DOA: 12/19/2019  PCP: Hoyt Koch, MD   I have briefly reviewed patients previous medical records in [facility].  Chief Complaint:   Chief Complaint  Patient presents with  . Shortness of Breath    Brief Narrative:  54 year old female, lives with her siblings, independent, PMH of metastatic breast cancer on chemotherapy, recurrent left pleural effusion for which she has undergone thoracentesis, hyperlipidemia, presented to [facility] from the cancer center on 12/19/2019 due to progressive dyspnea, some cough and hypoxia.  She was admitted for acute hypoxic respiratory failure due to recurrent large left pleural effusion, possible right upper lobe pneumonia and chemotherapy related pancytopenia.  Oncology consulted.  S/p left Pleurx by IR 3/11 and drained 1 L bloodstained fluid right away.  Clinically improved.  Hypoxia resolved.   Assessment & Plan:  Principal Problem:   Acute respiratory failure with hypoxia (HCC) Active Problems:   Borderline hyperlipidemia   HCAP (healthcare-associated pneumonia)   Breast cancer ([place])   Acute respiratory failure with hypoxia: Due to recurrent large left pleural effusion with compressive atelectasis, suspected right upper lobe pneumonia seen on CTA chest on admission.  No PE noted.  Treat underlying cause. Incentive spirometry.  Resolved.  Reports that she has not been on oxygen post Pleurx placement yesterday.  Saturating in the high 90s on room air.  Recurrent large left malignant pleural effusion: Suspect malignant effusion.  Has had prior thoracentesis by IR for aware of her case. S/p left Pleurx catheter by IR on 3/11 which immediately drained 1 L of fluid.  As per discussion with Dr. Learta Codding, plan to drain Pleurx every other day.  Will arrange home health RN for assistance.  Suspected right upper lobe pneumonia: Complicating chemotherapy related  pancytopenia.  Continue IV cefepime.  As discussed with Dr. Benay Spice on 3/11, recommends continuing IV antibiotics until [place] at least 0.5 or higher before discharging on oral antibiotics (levofloxacin) to complete a total 7-day course.  ANC today 0.2.  Received only a single dose of IV vancomycin in ED which was not continued.  As discussed with oncology, index of suspicion is low for pneumonia but cannot be completely ruled out, given low ANC would continue antibiotics for now.  However would hold off on adding back IV vancomycin.  Pancytopenia: Secondary to chemotherapy, last dose on 12/12/2019.  Patient reportedly received growth factors on 3/4.  As per oncology follow-up, no need to repeat.  Follow CBCs closely.  Neutropenic precautions.  ANC 0.2.  I discussed with Dr. Benay Spice on 11/3, she received Neulasta on 3/4 and may take 10 to 14 days before WBC start to increase.  He recommends monitoring her in the hospital on IV antibiotics until ANC 0.5 or higher.  She has a follow-up appointment with oncology on 3/17.  Metastatic breast cancer: Per oncology.  Follow-up appreciated.  Hyperlipidemia: Continue statins.  Left upper extremity edema/DVT: Resume home dose of full dose Lovenox from 3/12 as verified with IR yesterday.  Body mass index is 31.37 kg/m.   DVT prophylaxis: Lovenox full dose Code Status: Full Family Communication: Discussed in detail with patient's niece at bedside again today on 3/12. Disposition:  . Patient came from: Home           . Anticipated d/c place: Home . Barriers to d/c: Neutropenia.  DC home when [place] 0.5 or greater as per medical oncologist advice.   Consultants:  Medical oncology Interventional radiology  Procedures:   None  Antimicrobials:   Vancomycin x1 dose and cefepime   Subjective:  Feels much better.  Dyspnea and chest tightness resolved.  Breathing back to baseline.  Has been off of oxygen since Pleurx catheter placement yesterday.  Ambulated  some in the room.  Wants to learn management of her Pleurx catheter.  No other complaints reported.  Objective:   Vitals:   12/20/19 2051 12/20/19 2100 12/21/19 0608 12/21/19 0644  BP: 123/79  139/80   Pulse: (!) 105 98 (!) 108 (!) 102  Resp: (!) 21 18 (!) 22   Temp: 98.2 F (36.8 C)  98.3 F (36.8 C)   TempSrc: Oral  Oral   SpO2: 95%  97%   Weight:      Height:        General exam: Pleasant young female, moderately built and obese sitting up comfortably in chair this morning.  Appeared in good spirits. Respiratory system: Much improved breath sounds, slightly diminished in the left base.  Otherwise clear to auscultation.  No increased work of breathing.  On room air. Cardiovascular system: S1 & S2 heard, RRR. No JVD, murmurs, rubs, gallops or clicks.  Diffuse left upper extremity edema, chronic.  Trace bilateral ankle edema.  Telemetry personally reviewed: Sinus rhythm.  Occasional mild sinus tachycardia. Gastrointestinal system: Abdomen is nondistended, soft and nontender. No organomegaly or masses felt. Normal bowel sounds heard. Central nervous system: Alert and oriented. No focal neurological deficits. Extremities: Symmetric 5 x 5 power. Skin: No rashes, lesions or ulcers Psychiatry: Judgement and insight appear normal. Mood & affect appropriate.     Data Reviewed:   I have personally reviewed following labs and imaging studies   CBC: Recent Labs  Lab 12/19/19 0851 12/19/19 0851 12/19/19 1702 12/20/19 0451 12/21/19 0403  WBC 0.6*  --  0.7* 0.7* 0.7*  NEUTROABS 0.1*  --   --   --  0.2*  HGB 7.2*  --  10.3* 9.9* 9.3*  HCT 24.4*   < > 33.5* 31.9* 30.0*  MCV 91.4   < > 91.0 91.7 92.0  PLT 119*  --  135* 110* 71*   < > = values in this interval not displayed.    Basic Metabolic Panel: Recent Labs  Lab 12/19/19 1702 12/20/19 0451 12/21/19 0403  NA 140 140 140  K 3.9 3.8 3.5  CL 104 105 104  CO2 24 27 27   GLUCOSE 92 92 100*  BUN 9 7 12   CREATININE 0.36*  0.38* 0.50  CALCIUM 8.6* 8.5* 8.2*    Liver Function Tests: Recent Labs  Lab 12/20/19 0451  AST 15  ALT 20  ALKPHOS 205*  BILITOT 0.8  PROT 6.1*  ALBUMIN 2.7*    CBG: No results for input(s): GLUCAP in the last 168 hours.  Microbiology Studies:   Recent Results (from the past 240 hour(s))  Culture, blood (Routine X 2) w Reflex to ID Panel     Status: None (Preliminary result)   Collection Time: 12/19/19  8:06 PM   Specimen: BLOOD  Result Value Ref Range Status   Specimen Description   Final    BLOOD RIGHT ANTECUBITAL Performed at [facility] [address], [place], [place] [address]    Special Requests   Final    BOTTLES DRAWN AEROBIC AND ANAEROBIC Blood Culture results may not be optimal due to an excessive volume of blood received in culture bottles Performed at [facility],  [place] [address], [place], [place] [address]    Culture   Final    NO GROWTH 1 DAY Performed at [facility], [place] [address] [place], [place] [address]    Report Status PENDING  Incomplete  SARS CORONAVIRUS 2 (TAT 6-24 HRS) Nasopharyngeal Nasopharyngeal Swab     Status: None   Collection Time: 12/19/19  8:30 PM   Specimen: Nasopharyngeal Swab  Result Value Ref Range Status   SARS Coronavirus 2 NEGATIVE NEGATIVE Final    Comment: (NOTE) SARS-CoV-2 target nucleic acids are NOT DETECTED. The SARS-CoV-2 RNA is generally detectable in upper and lower respiratory specimens during the acute phase of infection. Negative results do not preclude SARS-CoV-2 infection, do not rule out co-infections with other pathogens, and should not be used as the sole basis for treatment or other patient management decisions. Negative results must be combined with clinical observations, patient history, and epidemiological information. The expected result is Negative. Fact Sheet for Patients: SugarRoll.be Fact Sheet for Healthcare  Providers: https://www.woods-mathews.com/ This test is not yet approved or cleared by the [place] FDA and  has been authorized for detection and/or diagnosis of SARS-CoV-2 by FDA under an Emergency Use Authorization (EUA). This EUA will remain  in effect (meaning this test can be used) for the duration of the COVID-19 declaration under Section 56 4(b)(1) of the Act, 21 U.S.C. section 360bbb-3(b)(1), unless the authorization is terminated or revoked sooner. Performed at [facility], [place] [address], [place], [place] [address]   Culture, blood (Routine X 2) w Reflex to ID Panel     Status: None (Preliminary result)   Collection Time: 12/19/19  9:34 PM   Specimen: BLOOD RIGHT HAND  Result Value Ref Range Status   Specimen Description   Final    BLOOD RIGHT HAND Performed at [facility] [address] [place], [place] [address]    Special Requests   Final    BOTTLES DRAWN AEROBIC AND ANAEROBIC Blood Culture adequate volume Performed at [facility] [address], [place], [place] [address]    Culture   Final    NO GROWTH 1 DAY Performed at [facility], [place] [address], [place], [place] [address]    Report Status PENDING  Incomplete     Radiology Studies:  No results found.   Scheduled Meds:   . Chlorhexidine Gluconate Cloth  6 each Topical Daily  . enoxaparin  1.5 mg/kg Subcutaneous Q24H  . sodium chloride flush  3 mL Intravenous Once    Continuous Infusions:   . ceFEPime (MAXIPIME) IV 2 g (12/21/19 0548)     LOS: 2 days     Vernell Leep, MD, [place], [facility]. [facility]    To contact the attending provider between 7A-7P or the covering provider during after hours 7P-7A, please log into the web site [email or web address] and access using universal [place] password for that web site. If you do not have the password, please call the hospital operator.  12/21/2019, 1:44 PM

## 2019-12-21 NOTE — Plan of Care (Signed)
  Problem: Health Behavior/Discharge Planning: Goal: Ability to manage health-related needs will improve Outcome: Progressing   Problem: Clinical Measurements: Goal: Ability to maintain clinical measurements within normal limits will improve Outcome: Progressing Goal: Will remain free from infection Outcome: Progressing Goal: Diagnostic test results will improve Outcome: Progressing Goal: Respiratory complications will improve Outcome: Progressing Goal: Cardiovascular complication will be avoided Outcome: Progressing   Problem: Activity: Goal: Risk for activity intolerance will decrease Outcome: Progressing   Problem: Nutrition: Goal: Adequate nutrition will be maintained Outcome: Progressing   Problem: Coping: Goal: Level of anxiety will decrease Outcome: Progressing   Problem: Elimination: Goal: Will not experience complications related to bowel motility Outcome: Progressing Goal: Will not experience complications related to urinary retention Outcome: Progressing   Problem: Pain Managment: Goal: General experience of comfort will improve Outcome: Progressing   Problem: Safety: Goal: Ability to remain free from injury will improve Outcome: Progressing   Problem: Skin Integrity: Goal: Risk for impaired skin integrity will decrease Outcome: Progressing   Problem: Activity: Goal: Ability to tolerate increased activity will improve Outcome: Progressing   Problem: Clinical Measurements: Goal: Ability to maintain a body temperature in the normal range will improve Outcome: Progressing   Problem: Respiratory: Goal: Ability to maintain adequate ventilation will improve Outcome: Progressing Goal: Ability to maintain a clear airway will improve Outcome: Progressing   

## 2019-12-21 NOTE — Progress Notes (Signed)
Referring Physician(s): Abigail [name]  Supervising Physician: Abigail [name]  Patient Status:  [name]  Chief Complaint: Metastatic breast cancer, symptomatic malignant left pleural effusion   Subjective: Patient doing fairly well today.  Does have some soreness at left Pleurx drain insertion site.  States breathing has improved since 1 L pleural fluid removed yesterday   Allergies: Shellfish allergy and Sulfamethoxazole  Medications: Prior to Admission medications   Medication Sig Start Date End Date Taking? Authorizing Provider  enoxaparin (LOVENOX) 120 MG/0.8ML injection Inject 0.73 mLs (110 mg total) into the skin daily. 11/08/19  Yes Abigail Shark, NP  guaifenesin (ROBITUSSIN) 100 MG/5ML syrup Take 200 mg by mouth 3 (three) times daily as needed for cough.   Yes [provider]  HYDROcodone-acetaminophen (NORCO/VICODIN) 5-325 MG tablet Take 1 tablet by mouth every 8 (eight) hours as needed for moderate pain or severe pain. 12/04/19  Yes Abigail Shark, NP  lidocaine-prilocaine (EMLA) cream Apply 1 application topically as directed. Apply to port 1 hour prior to stick and cover with plastic wrap 11/02/19  Yes Abigail Pier, MD  ondansetron (ZOFRAN) 8 MG tablet Take 1 tablet (8 mg total) by mouth every 8 (eight) hours as needed for nausea or vomiting. 11/02/19  Yes Abigail Pier, MD  potassium chloride SA (KLOR-CON) 20 MEQ tablet Take 1 tablet (20 mEq total) by mouth daily. 12/12/19  Yes Abigail Pier, MD  ciprofloxacin (CIPRO) 500 MG tablet Take 1 tablet (500 mg total) by mouth 2 (two) times daily for 7 days. 12/19/19 12/26/19  Abigail Shark, NP  methocarbamol (ROBAXIN) 500 MG tablet Take 500 mg by mouth 4 (four) times daily. 10/18/19   [provider]  prochlorperazine (COMPAZINE) 10 MG tablet Take 1 tablet (10 mg total) by mouth every 6 (six) hours as needed for nausea. Patient not taking: Reported on 12/19/2019 11/02/19   Abigail Pier, MD      Vital Signs: BP 139/80 (BP Location: Right Arm)   Pulse (!) 102   Temp 98.3 F (36.8 C) (Oral)   Resp (!) 22   Ht 5\' 1"  (1.549 m)   Wt 166 lb (75.3 kg)   SpO2 97%   BMI 31.37 kg/m   Physical Exam awake, alert.  Left Pleurx catheter intact, insertion site mildly tender, no obvious leaking at insertion site at this time.  Imaging: DG Chest 2 View  Result Date: 12/19/2019 CLINICAL DATA:  Dyspnea. EXAM: CHEST - 2 VIEW COMPARISON:  March 2nd 2021 FINDINGS: Redemonstration of a right internal jugular central vascular catheter port, its tip projecting on the superior vena cavoatrial junction. Similar lung inflation and subtle interstitial edema with worsened pleural fluid and dense consolidation on the left that now obscures the left heart border and continues to obscure the left hemidiaphragm. Spinal dextrocurvature, mild skeletal degenerative change, and aortic knob calcified atherosclerosis are redemonstrated. IMPRESSION: Interval worsened left pleural effusion and consolidation, possibly malignant or bland pleural effusion with compressive atelectasis versus a left pneumonia. Stable central catheter port placement and similar persistent interstitial edema. Aortic calcified atherosclerosis. Electronically Signed   By: Abigail [name]   On: 12/19/2019 17:52   CT Angio Chest PE W/Cm &/Or Wo Cm  Result Date: 12/19/2019 CLINICAL DATA:  54 year old female with increased shortness of breath. Metastatic breast cancer. Recent ultrasound-guided left side thoracentesis. EXAM: CT ANGIOGRAPHY CHEST WITH CONTRAST TECHNIQUE: Multidetector CT imaging of the chest was performed using the standard protocol during bolus administration of intravenous contrast.  Multiplanar CT image reconstructions and MIPs were obtained to evaluate the vascular anatomy. CONTRAST:  152mL OMNIPAQUE IOHEXOL 350 MG/ML SOLN COMPARISON:  Post thoracentesis chest radiograph 12/11/2019. Two-view chest radiographs earlier today. CTA chest  10/25/2019. FINDINGS: Cardiovascular: Good contrast bolus timing in the pulmonary arterial tree. Intermittent respiratory motion. No central or left lung pulmonary embolus. Enhancing compressed left lower lung. Distal right lung branches are obscured by motion, but no convincing pulmonary artery filling defect is identified. Stable and normal cardiac size. No pericardial effusion. Negative visible aorta. Right IJ approach Port-A-Cath now in place. Mediastinum/Nodes: No mediastinal or hilar lymphadenopathy identified. Lungs/Pleura: Large layering and sub pulmonic left pleural effusion with simple fluid density has significantly increased since the thoracentesis on 12/11/2019. But no areas of left pleural nodularity or mass are identified. There is a new trace right pleural effusion. There is increasing peribronchial and confluent right upper lung opacity. Patchy opacity was present here in January. This has an inflammatory appearance. Major airways are patent aside from atelectatic changes and respiratory motion. There is mild diffuse pulmonary septal thickening. Upper Abdomen: Negative visible liver, spleen, pancreas, adrenal glands, kidneys and bowel in the upper abdomen. Musculoskeletal: Diffuse osseous metastatic disease in the visible skeleton. Osteolysis and pathologic fracture of the posterior left 1st rib has increased since January. Review of the MIP images confirms the above findings. IMPRESSION: 1. No acute pulmonary embolus identified. Distal right lung pulmonary branches are degraded by motion. 2. Large layering and subpulmonic left pleural effusion with significant increase since the thoracentesis on 12/11/2019. Left lung compressive atelectasis. No left pleural metastasis identified. 3. Increased dependent right upper lung opacity since January which appears inflammatory. Consider pneumonia, aspiration. 4. Mild pulmonary interstitial edema suspected. Trace new right pleural effusion. 5. Diffuse osseous  metastatic disease with some progression since January, including increased lysis and pathologic fracture of the posterior left 1st rib. Electronically Signed   By: Abigail [name] M.D.   On: 12/19/2019 19:40   IR PERC PLEURAL DRAIN W/INDWELL CATH W/IMG GUIDE  Result Date: 12/20/2019 CLINICAL DATA:  Recurrent malignant left pleural effusion from metastatic breast carcinoma. EXAM: INSERTION OF TUNNELED PLEURAL DRAINAGE CATHETER ANESTHESIA/SEDATION: 2.0 mg IV Versed; 100 mcg IV Fentanyl. Total Moderate Sedation Time 24 minutes. The patient's level of consciousness and physiologic status were continuously monitored during the procedure by Radiology nursing. MEDICATIONS: 2 g IV Ancef. Antibiotic was administered in an appropriate time interval for the procedure. FLUOROSCOPY TIME:  24 seconds.  63 mGy. PROCEDURE: The procedure, risks, benefits, and alternatives were explained to the patient. Questions regarding the procedure were encouraged and answered. The patient understands and consents to the procedure. A time-out was performed prior to initiating the procedure. The left chest wall was prepped with chlorhexidine in a sterile fashion, and a sterile drape was applied covering the operative field. A sterile gown and sterile gloves were used for the procedure. Local anesthesia was provided with 1% Lidocaine. Ultrasound image documentation was performed. Fluoroscopy during the procedure and fluoroscopic spot radiograph confirms appropriate catheter position. After creating a small skin incision, a 19 gauge needle was advanced into the pleural cavity under ultrasound guidance. A guide wire was then advanced under fluoroscopy into the pleural space. Pleural access was dilated serially and a 16-French peel-away sheath placed. A 15.5 French tunneled PleurX catheter was placed. This was tunneled from an incision 5 cm below the pleural access to the access site. The catheter was advanced through the peel-away sheath. The sheath  was then removed.  Final catheter positioning was confirmed with a fluoroscopic spot image. The access incision was closed with subcuticular 4-0 Vicryl. Dermabond was applied to the incision. A Prolene retention suture was applied at the catheter exit site. Large volume thoracentesis was performed through the new catheter utilizing a vacuum bottle. COMPLICATIONS: None. FINDINGS: The catheter was placed via the left posterolateral chest wall. Approximately 1 L of bloody pleural fluid was able to be removed after catheter placement. IMPRESSION: Placement of tunneled left PleurX drainage catheter into the left pleural space. 1 L of bloody pleural fluid was removed today after catheter placement. Electronically Signed   By: Abigail [name] M.D.   On: 12/20/2019 14:04    Labs:  CBC: Recent Labs    12/19/19 0851 12/19/19 1702 12/20/19 0451 12/21/19 0403  WBC 0.6* 0.7* 0.7* 0.7*  HGB 7.2* 10.3* 9.9* 9.3*  HCT 24.4* 33.5* 31.9* 30.0*  PLT 119* 135* 110* 71*    COAGS: Recent Labs    10/28/19 1420 10/28/19 2114 10/29/19 0738 10/29/19 1016 10/30/19 0555  INR  --   --   --  1.1  --   APTT 59* 52* 109*  --  66*    BMP: Recent Labs    12/12/19 0835 12/19/19 1702 12/20/19 0451 12/21/19 0403  NA 140 140 140 140  K 3.2* 3.9 3.8 3.5  CL 102 104 105 104  CO2 28 24 27 27   GLUCOSE 105* 92 92 100*  BUN 8 9 7 12   CALCIUM 8.9 8.6* 8.5* 8.2*  CREATININE 0.66 0.36* 0.38* 0.50  GFRNONAA >60 >60 >60 >60  GFRAA >60 >60 >60 >60    LIVER FUNCTION TESTS: Recent Labs    11/21/19 0820 11/28/19 0844 12/12/19 0835 12/20/19 0451  BILITOT 0.3 0.2* 0.4 0.8  AST 61* 40 23 15  ALT 75* 69* 31 20  ALKPHOS 195* 260* 347* 205*  PROT 6.8 6.6 6.4* 6.1*  ALBUMIN 2.7* 2.8* 2.8* 2.7*    Assessment and Plan: Patient with history of metastatic breast cancer with recurrent malignant left pleural effusion; status post left Pleurx catheter placement on 3/11.  Patient currently stable and breathing better  since 1 L pleural fluid removed yesterday.  Drain Pleurx as needed based on patient's symptoms.  Other plans per oncology.   Electronically Signed: D. Rowe Robert, PA-C 12/21/2019, 10:12 AM   I spent a total of 15 minutes at the the patient's bedside AND on the patient's hospital floor or unit, greater than 50% of which was counseling/coordinating care for left Pleurx catheter    Patient ID: Abigail [name], female   DOB: 29-Dec-1965, 54 y.o.   MRN: [id number]

## 2019-12-21 NOTE — TOC Initial Note (Signed)
Transition of Care [facility]) - Initial/Assessment Note    Patient Details  Name: Abigail [name] MRN: [id number] Date of Birth: [birth year]  Transition of [facility]) CM/SW Contact:    Lennart Pall, LCSW Phone Number: 12/21/2019, 4:42 PM  Clinical Narrative:      Met with pt today to review d/c needs.  Per MD, pt to dc home with pleurex drains and requesting HHRN follow up.  Pt and sister agreeable, however, as of this writing, I have contacted the following agencies:  [facility], [facility], [facility], [facility], [facility], Vivianne Spence [facility] and [facility].  NONE are able to staff case due to insurance network or staffing.  Have discussed with pt who is aware that she and her sister need to continue to complete education on pleurex drain care as I am unable to secure [facility] services.  She is aware and agreeable.  Feeling more comfortable with this idea and plans to watch videos and receive further education from nursing tomorrow.  Anticipate likely d/c tomorrow.             Expected Discharge Plan: [place] Barriers to Discharge: Continued Medical Work up   Patient Goals and CMS Choice Patient states their goals for this hospitalization and ongoing recovery are:: to return home CMS Medicare.gov Compare Post Acute Care list provided to:: Patient Choice offered to / list presented to : Patient  Expected Discharge Plan and Services Expected Discharge Plan: [place] In-house Referral: Clinical Social Work   Post Acute Care Choice: [place] arrangements for the past 2 months: [place]                                      Prior Living Arrangements/Services Living arrangements for the past 2 months: Single Family Home Lives with:: Self   Do you feel safe going back to the place where you live?: Yes      Need for Family Participation in Patient Care: Yes (Comment) Care giver support system in place?: Yes (comment)      Activities of  Daily Living Home Assistive Devices/Equipment: Wheelchair(Wheelchair is at home,NOT in hospital) ADL Screening (condition at time of admission) Patient's cognitive ability adequate to safely complete daily activities?: Yes Is the patient deaf or have difficulty hearing?: No Does the patient have difficulty seeing, even when wearing glasses/contacts?: No Does the patient have difficulty concentrating, remembering, or making decisions?: No Patient able to express need for assistance with ADLs?: Yes Does the patient have difficulty dressing or bathing?: Yes Independently performs ADLs?: No Communication: Independent Dressing (OT): Needs assistance Is this a change from baseline?: Change from baseline, expected to last <3days Grooming: Needs assistance Is this a change from baseline?: Change from baseline, expected to last <3 days Feeding: Independent Bathing: Needs assistance Is this a change from baseline?: Change from baseline, expected to last <3 days Toileting: Needs assistance Is this a change from baseline?: Change from baseline, expected to last <3 days In/Out Bed: Needs assistance Is this a change from baseline?: Change from baseline, expected to last <3 days Walks in Home: Independent with device (comment) Does the patient have difficulty walking or climbing stairs?: Yes Weakness of Legs: Both Weakness of Arms/Hands: Both  Permission Sought/Granted Permission sought to share information with : Family Supports Permission granted to share information with : Yes, Verbal Permission Granted  Share Information with NAME:  Abigail [name]     Permission granted to share info w Relationship: sister  Permission granted to share info w Contact Information: [phone]  Emotional Assessment Appearance:: Appears stated age Attitude/Demeanor/Rapport: Engaged, Gracious Affect (typically observed): Pleasant Orientation: : Oriented to Self, Oriented to Place, Oriented to  Time,  Oriented to Situation   Psych Involvement: No (comment)  Admission diagnosis:  SOB (shortness of breath) [R06.02] Pleural effusion on left [J90] Acute respiratory failure with hypoxia (HCC) [J96.01] HCAP (healthcare-associated pneumonia) [J18.9] Malignant neoplasm of female breast, unspecified estrogen receptor status, unspecified laterality, unspecified site of breast ([place]) [C50.919] Patient Active Problem List   Diagnosis Date Noted  . Acute respiratory failure with hypoxia ([place]) 12/19/2019  . Port-A-Cath in place 11/14/2019  . Goals of care, counseling/discussion 11/02/2019  . Breast cancer ([place]) 11/02/2019  . HCAP (healthcare-associated pneumonia) 10/25/2019  . 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 Breast mass, left 10/25/2019  . DVT (deep vein thrombosis) in pregnancy 10/25/2019  . Hypokalemia 10/25/2019  . Acute deep vein thrombosis (DVT) of axillary vein of left upper extremity ([place]) 10/19/2019  . Left arm pain 09/10/2019  . Routine health maintenance 08/04/2011  . Borderline hyperlipidemia 06/05/2010   PCP:  Hoyt Koch, MD Pharmacy:   [id number] [place] [place]Phone: [phone]: [phone]    Social Determinants of Health (SDOH) Interventions    Readmission Risk Interventions No flowsheet data found.

## 2019-12-22 LAB — CBC WITH DIFFERENTIAL/PLATELET
Abs Immature Granulocytes: 0.55 10*3/uL — ABNORMAL HIGH (ref 0.00–0.07)
Basophils Absolute: 0 10*3/uL (ref 0.0–0.1)
Basophils Relative: 2 %
Eosinophils Absolute: 0 10*3/uL (ref 0.0–0.5)
Eosinophils Relative: 2 %
HCT: 29.4 % — ABNORMAL LOW (ref 36.0–46.0)
Hemoglobin: 9.1 g/dL — ABNORMAL LOW (ref 12.0–15.0)
Immature Granulocytes: 38 %
Lymphocytes Relative: 33 %
Lymphs Abs: 0.5 10*3/uL — ABNORMAL LOW (ref 0.7–4.0)
MCH: 28.3 pg (ref 26.0–34.0)
MCHC: 31 g/dL (ref 30.0–36.0)
MCV: 91.3 fL (ref 80.0–100.0)
Monocytes Absolute: 0.2 10*3/uL (ref 0.1–1.0)
Monocytes Relative: 13 %
Neutro Abs: 0.2 10*3/uL — ABNORMAL LOW (ref 1.7–7.7)
Neutrophils Relative %: 12 %
Platelets: 54 10*3/uL — ABNORMAL LOW (ref 150–400)
RBC: 3.22 MIL/uL — ABNORMAL LOW (ref 3.87–5.11)
RDW: 16 % — ABNORMAL HIGH (ref 11.5–15.5)
WBC: 1.5 10*3/uL — ABNORMAL LOW (ref 4.0–10.5)
nRBC: 5.4 % — ABNORMAL HIGH (ref 0.0–0.2)

## 2019-12-22 NOTE — Progress Notes (Signed)
PROGRESS NOTE   Abigail [name]  [id number]    DOB: Jan 21, 1966    DOA: 12/19/2019  PCP: Hoyt Koch, MD   I have briefly reviewed patients previous medical records in [facility].  Chief Complaint:   Chief Complaint  Patient presents with  . Shortness of Breath    Brief Narrative:  54 year old female, lives with her siblings, independent, PMH of metastatic breast cancer on chemotherapy, recurrent left pleural effusion for which she has undergone thoracentesis, hyperlipidemia, presented to [facility] from the cancer center on 12/19/2019 due to progressive dyspnea, some cough and hypoxia.  She was admitted for acute hypoxic respiratory failure due to recurrent large left pleural effusion, possible right upper lobe pneumonia and chemotherapy related pancytopenia.  Oncology consulted.  S/p left Pleurx by IR 3/11 and drained 1 L bloodstained fluid right away.  Clinically improved.  Hypoxia resolved.   Assessment & Plan:  Principal Problem:   Acute respiratory failure with hypoxia (HCC) Active Problems:   Borderline hyperlipidemia   HCAP (healthcare-associated pneumonia)   Breast cancer ([place])   Acute respiratory failure with hypoxia: Due to recurrent large left pleural effusion with compressive atelectasis, suspected right upper lobe pneumonia seen on CTA chest on admission.  No PE noted.  Treated underlying cause.  Continue incentive spirometry.  Resolved.  Recurrent large left malignant pleural effusion: Suspect malignant effusion.  History of prior thoracentesis for same by IR.  S/p left Pleurx catheter by IR on 3/11 which immediately drained 1 L of fluid.  As per discussion with Dr. Learta Codding, plan to drain Pleurx every other day.  RN to train patient and family to drain Pleurx 3/13.  TOC unable to arrange home health RN and hence patient and family planning to do this by themselves.  Suspected right upper lobe pneumonia: Complicating chemotherapy related  pancytopenia.  Continue IV cefepime.  As discussed with Dr. Benay Spice on 3/11, recommends continuing IV antibiotics until [place] at least 0.5 or higher before discharging on oral antibiotics (levofloxacin) to complete a total 7-day course.  ANC remains at 0.2.  Received only a single dose of IV vancomycin in ED which was not continued.  As discussed with oncology, index of suspicion is low for pneumonia but cannot be completely ruled out, given low ANC would continue antibiotics for now.  However would hold off on adding back IV vancomycin.  Pancytopenia: Secondary to chemotherapy, last dose on 12/12/2019.  Patient reportedly received growth factors on 3/4.  As per oncology follow-up, no need to repeat.  Follow CBCs closely.  Neutropenic precautions.  ANC 0.2.  I discussed with Dr. Benay Spice on 11/3, she received Neulasta on 3/4 and may take 10 to 14 days before WBC start to increase.  He recommends monitoring her in the hospital on IV antibiotics until ANC 0.5 or higher.  She has a follow-up appointment with oncology on 3/17.  Although WBC up to 1.5, ANC still at 0.2.  Also platelets down to 54 but no bleeding.  Continue to follow CBC closely.  Metastatic breast cancer: Per oncology.  Follow-up appreciated.  Hyperlipidemia: Continue statins.  Left upper extremity edema/DVT: Resume home dose of full dose Lovenox from 3/12 as verified with IR yesterday.  Body mass index is 31.37 kg/m.   DVT prophylaxis: Lovenox full dose Code Status: Full Family Communication: Discussed in detail with patient's niece at bedside on 3/12.  None at bedside today. Disposition:  . Patient came from: Home           .  Anticipated d/c place: Home . Barriers to d/c: Neutropenia.  DC home when [place] 0.5 or greater as per medical oncologist advice.   Consultants:   Medical oncology Interventional radiology  Procedures:   None  Antimicrobials:   Vancomycin x1 dose and cefepime   Subjective:  Left upper extremity  swelling better.  No dyspnea.  States that she is going to learn how to use the Pleurx today along with her family.  As per RN, no acute issues reported.  Objective:   Vitals:   12/21/19 2112 12/22/19 0602 12/22/19 0743 12/22/19 1501  BP:  125/79 123/86 125/83  Pulse:  (!) 108 (!) 106 (!) 102  Resp:  18 18 (!) 21  Temp: 99.3 F (37.4 C) 99.8 F (37.7 C) 99.4 F (37.4 C) 98.5 F (36.9 C)  TempSrc: Oral Oral Oral Oral  SpO2:  97% 98% 95%  Weight:      Height:        General exam: Pleasant young female, moderately built and obese sitting up comfortably in chair this morning.  Appeared in good spirits. Respiratory system: Diminished breath sounds in the left base but otherwise clear to auscultation.  No increased work of breathing. Cardiovascular system: S1 & S2 heard, RRR. No JVD, murmurs, rubs, gallops or clicks.  Diffuse left upper extremity edema, chronic but does seem better compared to the last 2 days.  Trace bilateral ankle edema.  Gastrointestinal system: Abdomen is nondistended, soft and nontender. No organomegaly or masses felt. Normal bowel sounds heard. Central nervous system: Alert and oriented.  No focal neurological deficits. Extremities: Symmetric 5 x 5 power. Skin: No rashes, lesions or ulcers Psychiatry: Judgement and insight appear normal. Mood & affect appropriate.     Data Reviewed:   I have personally reviewed following labs and imaging studies   CBC: Recent Labs  Lab 12/19/19 0851 12/19/19 1702 12/20/19 0451 12/21/19 0403 12/22/19 0409  WBC 0.6*   < > 0.7* 0.7* 1.5*  NEUTROABS 0.1*  --   --  0.2* 0.2*  HGB 7.2*   < > 9.9* 9.3* 9.1*  HCT 24.4*   < > 31.9* 30.0* 29.4*  MCV 91.4   < > 91.7 92.0 91.3  PLT 119*   < > 110* 71* 54*   < > = values in this interval not displayed.    Basic Metabolic Panel: Recent Labs  Lab 12/19/19 1702 12/20/19 0451 12/21/19 0403  NA 140 140 140  K 3.9 3.8 3.5  CL 104 105 104  CO2 24 27 27   GLUCOSE 92 92 100*    BUN 9 7 12   CREATININE 0.36* 0.38* 0.50  CALCIUM 8.6* 8.5* 8.2*    Liver Function Tests: Recent Labs  Lab 12/20/19 0451  AST 15  ALT 20  ALKPHOS 205*  BILITOT 0.8  PROT 6.1*  ALBUMIN 2.7*    CBG: No results for input(s): GLUCAP in the last 168 hours.  Microbiology Studies:   Recent Results (from the past 240 hour(s))  Culture, blood (Routine X 2) w Reflex to ID Panel     Status: None (Preliminary result)   Collection Time: 12/19/19  8:06 PM   Specimen: BLOOD  Result Value Ref Range Status   Specimen Description   Final    BLOOD RIGHT ANTECUBITAL Performed at [facility] [address] [place], [place] [address]    Special Requests   Final    BOTTLES DRAWN AEROBIC AND ANAEROBIC Blood Culture results may not be optimal  due to an excessive volume of blood received in culture bottles Performed at [facility] [address] [place], [place] [address]    Culture   Final    NO GROWTH 2 DAYS Performed at [facility] [address] [place], [place] [address]    Report Status PENDING  Incomplete  SARS CORONAVIRUS 2 (TAT 6-24 HRS) Nasopharyngeal Nasopharyngeal Swab     Status: None   Collection Time: 12/19/19  8:30 PM   Specimen: Nasopharyngeal Swab  Result Value Ref Range Status   SARS Coronavirus 2 NEGATIVE NEGATIVE Final    Comment: (NOTE) SARS-CoV-2 target nucleic acids are NOT DETECTED. The SARS-CoV-2 RNA is generally detectable in upper and lower respiratory specimens during the acute phase of infection. Negative results do not preclude SARS-CoV-2 infection, do not rule out co-infections with other pathogens, and should not be used as the sole basis for treatment or other patient management decisions. Negative results must be combined with clinical observations, patient history, and epidemiological information. The expected result is Negative. Fact Sheet for  Patients: SugarRoll.be Fact Sheet for Healthcare Providers: https://www.woods-mathews.com/ This test is not yet approved or cleared by the [place] FDA and  has been authorized for detection and/or diagnosis of SARS-CoV-2 by FDA under an Emergency Use Authorization (EUA). This EUA will remain  in effect (meaning this test can be used) for the duration of the COVID-19 declaration under Section 56 4(b)(1) of the Act, 21 U.S.C. section 360bbb-3(b)(1), unless the authorization is terminated or revoked sooner. Performed at [facility], [place] [address], [place], [place] [address]   Culture, blood (Routine X 2) w Reflex to ID Panel     Status: None (Preliminary result)   Collection Time: 12/19/19  9:34 PM   Specimen: BLOOD RIGHT HAND  Result Value Ref Range Status   Specimen Description   Final    BLOOD RIGHT HAND Performed at [facility] [address] [place], [place] [address]    Special Requests   Final    BOTTLES DRAWN AEROBIC AND ANAEROBIC Blood Culture adequate volume Performed at [facility] [address], [place], [place] [address]    Culture   Final    NO GROWTH 2 DAYS Performed at [facility] [address], [place], [place] [address]    Report Status PENDING  Incomplete     Radiology Studies:  No results found.   Scheduled Meds:   . Chlorhexidine Gluconate Cloth  6 each Topical Daily  . enoxaparin  1.5 mg/kg Subcutaneous Q24H    Continuous Infusions:   . ceFEPime (MAXIPIME) IV 2 g (12/22/19 1319)     LOS: 3 days     Vernell Leep, MD, [place], [facility] [place] [facility]. [facility]    To contact the attending provider between 7A-7P or the covering provider during after hours 7P-7A, please log into the web site [email or web address] and access using universal [place] password for that web site. If you do not have the password, please call the hospital operator.  12/22/2019,  4:14 PM

## 2019-12-22 NOTE — Progress Notes (Signed)
Pleurx drained per order.  400 ml of yellow fluid drained from Pleurx.  Patient tolerated well.  Patient and her niece educated on how to drain from Pleurx - demonstrated, written education materials and video provided.  Verbalized understanding.

## 2019-12-23 ENCOUNTER — Other Ambulatory Visit: Payer: Self-pay | Admitting: Oncology

## 2019-12-23 LAB — CBC WITH DIFFERENTIAL/PLATELET
Abs Immature Granulocytes: 0.57 10*3/uL — ABNORMAL HIGH (ref 0.00–0.07)
Basophils Absolute: 0.1 10*3/uL (ref 0.0–0.1)
Basophils Relative: 2 %
Eosinophils Absolute: 0 10*3/uL (ref 0.0–0.5)
Eosinophils Relative: 1 %
HCT: 27.6 % — ABNORMAL LOW (ref 36.0–46.0)
Hemoglobin: 8.5 g/dL — ABNORMAL LOW (ref 12.0–15.0)
Immature Granulocytes: 20 %
Lymphocytes Relative: 16 %
Lymphs Abs: 0.5 10*3/uL — ABNORMAL LOW (ref 0.7–4.0)
MCH: 28.1 pg (ref 26.0–34.0)
MCHC: 30.8 g/dL (ref 30.0–36.0)
MCV: 91.4 fL (ref 80.0–100.0)
Monocytes Absolute: 0.3 10*3/uL (ref 0.1–1.0)
Monocytes Relative: 12 %
Neutro Abs: 1.4 10*3/uL — ABNORMAL LOW (ref 1.7–7.7)
Neutrophils Relative %: 49 %
Platelets: 55 10*3/uL — ABNORMAL LOW (ref 150–400)
RBC: 3.02 MIL/uL — ABNORMAL LOW (ref 3.87–5.11)
RDW: 16 % — ABNORMAL HIGH (ref 11.5–15.5)
WBC: 2.8 10*3/uL — ABNORMAL LOW (ref 4.0–10.5)
nRBC: 3.6 % — ABNORMAL HIGH (ref 0.0–0.2)

## 2019-12-23 NOTE — Progress Notes (Signed)
PROGRESS NOTE   Abigail [name]  [id number]    DOB: [birth year]    DOA: 12/19/2019  PCP: Hoyt Koch, MD   I have briefly reviewed patients previous medical records in [facility].  Chief Complaint:   Chief Complaint  Patient presents with  . Shortness of Breath    Brief Narrative:  54 year old female, lives with her siblings, independent, PMH of metastatic breast cancer on chemotherapy, recurrent left pleural effusion for which she has undergone thoracentesis, hyperlipidemia, presented to [facility] from the cancer center on 12/19/2019 due to progressive dyspnea, some cough and hypoxia.  She was admitted for acute hypoxic respiratory failure due to recurrent large left pleural effusion, possible right upper lobe pneumonia and chemotherapy related pancytopenia.  Oncology consulted.  S/p left Pleurx by IR 3/11 and drained 1 L bloodstained fluid right away.  Clinically improved.  Hypoxia resolved.   Assessment & Plan:  Principal Problem:   Acute respiratory failure with hypoxia (HCC) Active Problems:   Borderline hyperlipidemia   HCAP (healthcare-associated pneumonia)   Breast cancer ([place])   Acute respiratory failure with hypoxia: Due to recurrent large left pleural effusion with compressive atelectasis, suspected right upper lobe pneumonia seen on CTA chest on admission.  No PE noted.  Treated underlying cause.  Continue incentive spirometry.  Resolved.  Recurrent large left malignant pleural effusion: Suspect malignant effusion.  History of prior thoracentesis for same by IR.  S/p left Pleurx catheter by IR on 3/11 which immediately drained 1 L of fluid.  As per discussion with Dr. Learta Codding, plan to drain Pleurx every other day.  RN to train patient and family to drain Pleurx 3/13.  TOC unable to arrange home health RN and hence patient and family planning to do this by themselves.  Patient and niece trained with Pleurx management yesterday but do not still feel  fully comfortable and want to do it one more time tomorrow prior to safe discharge home.  400 mL drained 3/13.  Suspected right upper lobe pneumonia: Complicating chemotherapy related pancytopenia.  Continue IV cefepime.  As discussed with Dr. Benay Spice on 3/11, recommends continuing IV antibiotics until [place] at least 0.5 or higher before discharging on oral antibiotics (levofloxacin) to complete a total 7-day course.  ANC now has increased to 1.4.  Received only a single dose of IV vancomycin in ED which was not continued.  As discussed with oncology, index of suspicion is low for pneumonia but cannot be completely ruled out, given low ANC would continue antibiotics for now.  However would hold off on adding back IV vancomycin.  Pancytopenia: Secondary to chemotherapy, last dose on 12/12/2019.  Patient reportedly received growth factors on 3/4.  As per oncology follow-up, no need to repeat.  Follow CBCs closely.  Neutropenic precautions.  ANC improved to 1.4.  I discussed with Dr. Benay Spice on 11/3, she received Neulasta on 3/4 and may take 10 to 14 days before WBC start to increase. She has a follow-up appointment with oncology on 3/17.  WBC improving.  Thrombocytopenia stable in the 50s.  Slight drop in hemoglobin from 9.1-8.5.  Follow CBC in a.m.  Metastatic breast cancer: Per oncology.  Follow-up appreciated.  Hyperlipidemia: Continue statins.  Left upper extremity edema/DVT: Resume home dose of full dose Lovenox from 3/12 as verified with IR yesterday.  Body mass index is 31.37 kg/m./Obesity   DVT prophylaxis: Lovenox full dose Code Status: Full Family Communication: Discussed in detail with patient's niece at bedside on 3/12.  None at bedside today. Disposition:  . Patient came from: Home           . Anticipated d/c place: Home . Barriers to d/c: Ongoing education regarding management of left Pleurx catheter by patient and family, they do not feel comfortable managing it yet and plan to get  further training tomorrow during time of drainage.  TOC unable to arrange home health RN to assist.  Possible discharge home 3/15.   Consultants:   Medical oncology Interventional radiology  Procedures:   Left Pleurx catheter placed by IR on 3/11  Antimicrobials:   Vancomycin x1 dose and cefepime >   Subjective:  Felt better after 400 mL drained from left Pleurx catheter on 3/13.  Denies dyspnea, pain or any other complaints.  As per RN, no acute issues noted.   Objective:   Vitals:   12/22/19 1501 12/22/19 1724 12/22/19 2014 12/23/19 0446  BP: 125/83 128/83 106/68 111/73  Pulse: (!) 102 (!) 107 (!) 115 (!) 105  Resp: (!) 21 (!) 25 16 17   Temp: 98.5 F (36.9 C) 98.8 F (37.1 C) 98.9 F (37.2 C) 98.7 F (37.1 C)  TempSrc: Oral Oral  Oral  SpO2: 95% 97% 96% 96%  Weight:      Height:       Patient examined along with her female RN as chaperone in the room.  General exam: Pleasant young female, moderately built and obese sitting up comfortably in chair this morning.  Appeared in good spirits. Respiratory system: Improved breath sounds in the left base after pleural fluid drainage yesterday.  Rest of lung fields clear to auscultation.  No increased work of breathing. Cardiovascular system: S1 & S2 heard, RRR. No JVD, murmurs, rubs, gallops or clicks.  Diffuse left upper extremity edema, chronic but does seem better compared to the last 2 days.  Trace bilateral ankle edema.  Stable without change. Gastrointestinal system: Abdomen is nondistended, soft and nontender. No organomegaly or masses felt. Normal bowel sounds heard. Central nervous system: Alert and oriented.  No focal neurological deficits. Extremities: Symmetric 5 x 5 power. Skin: No rashes, lesions or ulcers Psychiatry: Judgement and insight appear normal. Mood & affect appropriate.     Data Reviewed:   I have personally reviewed following labs and imaging studies   CBC: Recent Labs  Lab 12/21/19 0403  12/22/19 0409 12/23/19 0300  WBC 0.7* 1.5* 2.8*  NEUTROABS 0.2* 0.2* 1.4*  HGB 9.3* 9.1* 8.5*  HCT 30.0* 29.4* 27.6*  MCV 92.0 91.3 91.4  PLT 71* 54* 55*    Basic Metabolic Panel: Recent Labs  Lab 12/19/19 1702 12/20/19 0451 12/21/19 0403  NA 140 140 140  K 3.9 3.8 3.5  CL 104 105 104  CO2 24 27 27   GLUCOSE 92 92 100*  BUN 9 7 12   CREATININE 0.36* 0.38* 0.50  CALCIUM 8.6* 8.5* 8.2*    Liver Function Tests: Recent Labs  Lab 12/20/19 0451  AST 15  ALT 20  ALKPHOS 205*  BILITOT 0.8  PROT 6.1*  ALBUMIN 2.7*    CBG: No results for input(s): GLUCAP in the last 168 hours.  Microbiology Studies:   Recent Results (from the past 240 hour(s))  Culture, blood (Routine X 2) w Reflex to ID Panel     Status: None (Preliminary result)   Collection Time: 12/19/19  8:06 PM   Specimen: BLOOD  Result Value Ref Range Status   Specimen Description   Final    BLOOD RIGHT ANTECUBITAL Performed  at [facility], [place] [address] [place], [place] [address]    Special Requests   Final    BOTTLES DRAWN AEROBIC AND ANAEROBIC Blood Culture results may not be optimal due to an excessive volume of blood received in culture bottles Performed at [facility] [address], [place], [place] [address]    Culture   Final    NO GROWTH 2 DAYS Performed at [facility] [address], [place], [place] [address]    Report Status PENDING  Incomplete  SARS CORONAVIRUS 2 (TAT 6-24 HRS) Nasopharyngeal Nasopharyngeal Swab     Status: None   Collection Time: 12/19/19  8:30 PM   Specimen: Nasopharyngeal Swab  Result Value Ref Range Status   SARS Coronavirus 2 NEGATIVE NEGATIVE Final    Comment: (NOTE) SARS-CoV-2 target nucleic acids are NOT DETECTED. The SARS-CoV-2 RNA is generally detectable in upper and lower respiratory specimens during the acute phase of infection. Negative results do not preclude SARS-CoV-2 infection, do not rule out  co-infections with other pathogens, and should not be used as the sole basis for treatment or other patient management decisions. Negative results must be combined with clinical observations, patient history, and epidemiological information. The expected result is Negative. Fact Sheet for Patients: SugarRoll.be Fact Sheet for Healthcare Providers: https://www.woods-mathews.com/ This test is not yet approved or cleared by the [place] FDA and  has been authorized for detection and/or diagnosis of SARS-CoV-2 by FDA under an Emergency Use Authorization (EUA). This EUA will remain  in effect (meaning this test can be used) for the duration of the COVID-19 declaration under Section 56 4(b)(1) of the Act, 21 U.S.C. section 360bbb-3(b)(1), unless the authorization is terminated or revoked sooner. Performed at [facility], [place] [address] [place], [place] [address]   Culture, blood (Routine X 2) w Reflex to ID Panel     Status: None (Preliminary result)   Collection Time: 12/19/19  9:34 PM   Specimen: BLOOD RIGHT HAND  Result Value Ref Range Status   Specimen Description   Final    BLOOD RIGHT HAND Performed at [facility] [address] [place], [place] [address]    Special Requests   Final    BOTTLES DRAWN AEROBIC AND ANAEROBIC Blood Culture adequate volume Performed at [facility] [address] [place], [place] [address]    Culture   Final    NO GROWTH 2 DAYS Performed at [facility] [address], [place],  [address]    Report Status PENDING  Incomplete     Radiology Studies:  No results found.   Scheduled Meds:   . Chlorhexidine Gluconate Cloth  6 each Topical Daily  . enoxaparin  1.5 mg/kg Subcutaneous Q24H    Continuous Infusions:   . ceFEPime (MAXIPIME) IV 2 g (12/23/19 0506)     LOS: 4 days     Vernell Leep, MD, [place], [facility]. [facility]     To contact the attending provider between 7A-7P or the covering provider during after hours 7P-7A, please log into the web site [email or web address] and access using universal  password for that web site. If you do not have the password, please call the hospital operator.  12/23/2019, 12:41 PM

## 2019-12-23 NOTE — Progress Notes (Signed)
Pharmacy Antibiotic Note  Abigail [name] is a 54 y.o. female admitted on 12/19/2019 with HCAP.  Pharmacy has been consulted for Cefepime dosing.  Today, 12/23/2019:  D5 cefepime  WBC low but improving (last chemo 3/3, Neulasta 3/4)  Afebrile since 3/13  SCr stable WNL  Plan: Continue cefepime 2gm iv q8hr  Height: 5\' 1"  (154.9 cm) Weight: 166 lb (75.3 kg) IBW/kg (Calculated) : 47.8  Temp (24hrs), Avg:98.7 F (37.1 C), Min:98.5 F (36.9 C), Max:98.9 F (37.2 C)  Recent Labs  Lab 12/19/19 1702 12/19/19 2006 12/20/19 0451 12/21/19 0403 12/22/19 0409 12/23/19 0300  WBC 0.7*  --  0.7* 0.7* 1.5* 2.8*  CREATININE 0.36*  --  0.38* 0.50  --   --   LATICACIDVEN  --  1.1  --   --   --   --     Estimated Creatinine Clearance: 75.5 mL/min (by C-G formula based on SCr of 0.5 mg/dL).    Allergies  Allergen Reactions  . Shellfish Allergy Itching and Swelling  . Sulfamethoxazole     REACTION: swelling    Antimicrobials this admission:  Vancomycin 12/19/2019 x1 Cefepime 12/19/2019 >>   Dose adjustments this admission:  N/a  Microbiology results:  3/10 BCx: ngtd 3/10 Cov2: neg   Thank you for allowing pharmacy to be a part of this patient's care.  Reuel Boom, PharmD, BCPS [phone] 12/23/2019, 11:57 AM

## 2019-12-24 DIAGNOSIS — J910 Malignant pleural effusion: Secondary | ICD-10-CM

## 2019-12-24 LAB — CBC WITH DIFFERENTIAL/PLATELET
Abs Immature Granulocytes: 0.9 10*3/uL — ABNORMAL HIGH (ref 0.00–0.07)
Band Neutrophils: 12 %
Basophils Absolute: 0 10*3/uL (ref 0.0–0.1)
Basophils Relative: 0 %
Eosinophils Absolute: 0 10*3/uL (ref 0.0–0.5)
Eosinophils Relative: 0 %
HCT: 28.7 % — ABNORMAL LOW (ref 36.0–46.0)
Hemoglobin: 8.6 g/dL — ABNORMAL LOW (ref 12.0–15.0)
Lymphocytes Relative: 9 %
Lymphs Abs: 0.5 10*3/uL — ABNORMAL LOW (ref 0.7–4.0)
MCH: 27.9 pg (ref 26.0–34.0)
MCHC: 30 g/dL (ref 30.0–36.0)
MCV: 93.2 fL (ref 80.0–100.0)
Metamyelocytes Relative: 3 %
Monocytes Absolute: 0.4 10*3/uL (ref 0.1–1.0)
Monocytes Relative: 7 %
Myelocytes: 12 %
Neutro Abs: 3.8 10*3/uL (ref 1.7–7.7)
Neutrophils Relative %: 56 %
Platelets: 69 10*3/uL — ABNORMAL LOW (ref 150–400)
Promyelocytes Relative: 1 %
RBC: 3.08 MIL/uL — ABNORMAL LOW (ref 3.87–5.11)
RDW: 16.2 % — ABNORMAL HIGH (ref 11.5–15.5)
WBC: 5.6 10*3/uL (ref 4.0–10.5)
nRBC: 1.4 % — ABNORMAL HIGH (ref 0.0–0.2)

## 2019-12-24 LAB — MAGNESIUM: Magnesium: 1.9 mg/dL (ref 1.7–2.4)

## 2019-12-24 LAB — BASIC METABOLIC PANEL
Anion gap: 7 (ref 5–15)
BUN: 12 mg/dL (ref 6–20)
CO2: 29 mmol/L (ref 22–32)
Calcium: 8 mg/dL — ABNORMAL LOW (ref 8.9–10.3)
Chloride: 103 mmol/L (ref 98–111)
Creatinine, Ser: 0.47 mg/dL (ref 0.44–1.00)
GFR calc Af Amer: >60 mL/min (ref >60)
GFR calc non Af Amer: >60 mL/min (ref >60)
Glucose, Bld: 93 mg/dL (ref 70–99)
Potassium: 3.1 mmol/L — ABNORMAL LOW (ref 3.5–5.1)
Sodium: 139 mmol/L (ref 135–145)

## 2019-12-24 MED ORDER — LEVOFLOXACIN 750 MG PO TABS: 750.0000 mg | ORAL_TABLET | Freq: Every day | ORAL | 0 refills | Status: DC

## 2019-12-24 MED ORDER — LEVOFLOXACIN 750 MG PO TABS
750.0000 mg | ORAL_TABLET | Freq: Every day | ORAL | Status: DC
  Administered 2019-12-24: 750 mg via ORAL
  Filled 2019-12-24: qty 1

## 2019-12-24 MED ORDER — HEPARIN SOD (PORK) LOCK FLUSH 100 UNIT/ML IV SOLN
500.0000 [IU] | INTRAVENOUS | Status: AC | PRN
  Administered 2019-12-24: 500 [IU]
  Filled 2019-12-24: qty 5

## 2019-12-24 MED ORDER — POTASSIUM CHLORIDE CRYS ER 20 MEQ PO TBCR
30.0000 meq | EXTENDED_RELEASE_TABLET | ORAL | Status: AC
  Administered 2019-12-24 (×2): 30 meq via ORAL
  Filled 2019-12-24 (×2): qty 1

## 2019-12-24 NOTE — Discharge Instructions (Signed)

## 2019-12-24 NOTE — TOC Transition Note (Signed)
Transition of Care [facility]) - CM/SW Discharge Note   Patient Details  Name: Abigail [name] MRN: [id number] Date of Birth: 1966-03-13  Transition of Care [facility]) CM/SW Contact:  Dessa Phi, RN Phone Number: 12/24/2019, 11:52 AM   Clinical Narrative: Per prior note, & also called [facility]-unable to provide services of HHRN-pleurx cath drain d/t staffing. Per Nsg-patient's niece Abigail [name] able to return demo pleurx cath drain safely. Patient/niece agree to d/c plan. Box of cannisters has already been taken home-2 used of a total of 10 for teaching.Provided private duty care agencies info as a resource(informed of out of pocket cost)-voiced understanding.[employer] form-ongoing pleurx cath drainage kits to be faxed once MD has signed form. No further CM needs.     Final next level of care: Home/Self Care Barriers to Discharge: No Barriers Identified   Patient Goals and CMS Choice Patient states their goals for this hospitalization and ongoing recovery are:: to return home CMS Medicare.gov Compare Post Acute Care list provided to:: Patient Choice offered to / list presented to : Patient  Discharge Placement                       Discharge Plan and Services In-house Referral: Clinical Social Work   Post Acute Care Choice: Home Health                               Social Determinants of Health (SDOH) Interventions     Readmission Risk Interventions No flowsheet data found.

## 2019-12-24 NOTE — Progress Notes (Addendum)
HEMATOLOGY-ONCOLOGY PROGRESS NOTE  SUBJECTIVE: Continues to feel well.  No shortness of breath reported.  Still with swelling in her left arm and hand which is about the same.  Remains afebrile. Niece is coming later this morning for additional teaching for Pleurx catheter drainage.  Oncology History  Breast cancer ([place])  11/02/2019 Initial Diagnosis   Breast cancer ([place])   11/02/2019 Cancer Staging   Staging form: Breast, AJCC 8th Edition - Clinical: Stage IV (cT4, cN2a, pM1, ER-, PR-, HER2-) - Signed by Ladell Pier, MD on 11/02/2019   11/14/2019 - 12/11/2019 Chemotherapy   The patient had PACLitaxel-protein bound (ABRAXANE) chemo infusion 175 mg, 100 mg/m2 = 175 mg, Intravenous,  Once, 1 of 4 cycles Administration: 175 mg (11/14/2019), 175 mg (11/21/2019), 175 mg (11/28/2019)  for chemotherapy treatment.    12/12/2019 -  Chemotherapy   The patient had DOXOrubicin (ADRIAMYCIN) chemo injection 110 mg, 60 mg/m2 = 110 mg, Intravenous,  Once, 1 of 4 cycles Administration: 110 mg (12/12/2019) palonosetron (ALOXI) injection 0.25 mg, 0.25 mg, Intravenous,  Once, 1 of 4 cycles Administration: 0.25 mg (12/12/2019) pegfilgrastim-cbqv (UDENYCA) injection 6 mg, 6 mg, Subcutaneous, Once, 1 of 4 cycles Administration: 6 mg (12/14/2019) cyclophosphamide (CYTOXAN) 1,100 mg in sodium chloride 0.9 % 250 mL chemo infusion, 600 mg/m2 = 1,100 mg, Intravenous,  Once, 1 of 4 cycles Administration: 1,100 mg (12/12/2019) fosaprepitant (EMEND) 150 mg in sodium chloride 0.9 % 145 mL IVPB, 150 mg, Intravenous,  Once, 1 of 4 cycles Administration: 150 mg (12/12/2019)  for chemotherapy treatment.     PHYSICAL EXAMINATION:  Vitals:   12/24/19 0452 12/24/19 0925  BP: 102/66 128/83  Pulse: 97 98  Resp: 17   Temp: 98.6 F (37 C) 98.7 F (37.1 C)  SpO2: 99% 97%   Filed Weights   12/19/19 2317  Weight: 75.3 kg    Intake/Output from previous day: 03/14 0701 - 03/15 0700 In: 589 [P.O.:489; IV Piggyback:100] Out: 700  [Drains:700]  GENERAL:alert, no distress and comfortable OROPHARYNX: No thrush or mucositis LUNGS: Clear on the right, diminished left lung base, left posterior chest dressing without drainage HEART: regular rate & rhythm and no murmurs and no lower extremity edema, left upper extremity with persistent edema. ABDOMEN:abdomen soft, non-tender and normal bowel sounds SKIN: Persistent thickening at the left upper anterior chest and back, improved nodularity at the left axilla NEURO: alert & oriented x 3 with fluent speech, no focal motor/sensory deficits  Port-A-Cath without erythema  LABORATORY DATA:  I have reviewed the data as listed CMP Latest Ref Rng & Units 12/23/2019 12/21/2019 12/20/2019  Glucose 70 - 99 mg/dL 93 100(H) 92  BUN 6 - 20 mg/dL '12 12 7  ' Creatinine 0.44 - 1.00 mg/dL 0.47 0.50 0.38(L)  Sodium 135 - 145 mmol/L 139 140 140  Potassium 3.5 - 5.1 mmol/L 3.1(L) 3.5 3.8  Chloride 98 - 111 mmol/L 103 104 105  CO2 22 - 32 mmol/L '29 27 27  ' Calcium 8.9 - 10.3 mg/dL 8.0(L) 8.2(L) 8.5(L)  Total Protein 6.5 - 8.1 g/dL - - 6.1(L)  Total Bilirubin 0.3 - 1.2 mg/dL - - 0.8  Alkaline Phos 38 - 126 U/L - - 205(H)  AST 15 - 41 U/L - - 15  ALT 0 - 44 U/L - - 20    Lab Results  Component Value Date   WBC 5.6 12/23/2019   HGB 8.6 (L) 12/23/2019   HCT 28.7 (L) 12/23/2019   MCV 93.2 12/23/2019   PLT 69 (L)  12/23/2019   NEUTROABS 3.8 12/23/2019    DG Chest 1 View  Result Date: 12/11/2019 CLINICAL DATA:  54 year old female status post ultrasound-guided left side thoracentesis this afternoon. Metastatic breast cancer. EXAM: CHEST  1 VIEW COMPARISON:  Chest radiographs 11/28/2019 and earlier. FINDINGS: PA view at 1318 hours. No pneumothorax. Residual left side pleural effusion similar to that last month. Small or trace right pleural effusion also now suspected. Stable right chest power port. Stable cardiac size and mediastinal contours. Stable nonspecific bilateral pulmonary reticulonodular  opacity. Abnormal bone mineralization. The left glenohumeral joint has become dislocated since January. Negative visible bowel gas pattern. IMPRESSION: 1. No pneumothorax following Left side thoracentesis. Small left greater than right pleural effusions. 2. Osseous metastatic disease with dislocated left glenohumeral joint since January. 3. Stable nonspecific pulmonary interstitial opacity. Electronically Signed   By: Genevie Ann M.D.   On: 12/11/2019 15:28   DG Chest 1 View  Result Date: 11/28/2019 CLINICAL DATA:  54 year old female status post ultrasound-guided thoracentesis of malignant left pleural effusion. Breast cancer. EXAM: CHEST  1 VIEW COMPARISON:  PA chest 11/09/2019 and earlier. FINDINGS: PA view at 1622 hours. Small residual left pleural effusion, appears larger since January. No pneumothorax. Stable mediastinal contours. Coarse bilateral pulmonary interstitial opacity is stable. Stable right chest power port. Stable visualized osseous structures. Negative visible bowel gas pattern. IMPRESSION: No pneumothorax following left side thoracentesis. Residual small volume effusion. Electronically Signed   By: Genevie Ann M.D.   On: 11/28/2019 16:31   DG Chest 2 View  Result Date: 12/19/2019 CLINICAL DATA:  Dyspnea. EXAM: CHEST - 2 VIEW COMPARISON:  March 2nd 2021 FINDINGS: Redemonstration of a right internal jugular central vascular catheter port, its tip projecting on the superior vena cavoatrial junction. Similar lung inflation and subtle interstitial edema with worsened pleural fluid and dense consolidation on the left that now obscures the left heart border and continues to obscure the left hemidiaphragm. Spinal dextrocurvature, mild skeletal degenerative change, and aortic knob calcified atherosclerosis are redemonstrated. IMPRESSION: Interval worsened left pleural effusion and consolidation, possibly malignant or bland pleural effusion with compressive atelectasis versus a left pneumonia. Stable  central catheter port placement and similar persistent interstitial edema. Aortic calcified atherosclerosis. Electronically Signed   By: Revonda Humphrey   On: 12/19/2019 17:52   CT Angio Chest PE W/Cm &/Or Wo Cm  Result Date: 12/19/2019 CLINICAL DATA:  54 year old female with increased shortness of breath. Metastatic breast cancer. Recent ultrasound-guided left side thoracentesis. EXAM: CT ANGIOGRAPHY CHEST WITH CONTRAST TECHNIQUE: Multidetector CT imaging of the chest was performed using the standard protocol during bolus administration of intravenous contrast. Multiplanar CT image reconstructions and MIPs were obtained to evaluate the vascular anatomy. CONTRAST:  155m OMNIPAQUE IOHEXOL 350 MG/ML SOLN COMPARISON:  Post thoracentesis chest radiograph 12/11/2019. Two-view chest radiographs earlier today. CTA chest 10/25/2019. FINDINGS: Cardiovascular: Good contrast bolus timing in the pulmonary arterial tree. Intermittent respiratory motion. No central or left lung pulmonary embolus. Enhancing compressed left lower lung. Distal right lung branches are obscured by motion, but no convincing pulmonary artery filling defect is identified. Stable and normal cardiac size. No pericardial effusion. Negative visible aorta. Right IJ approach Port-A-Cath now in place. Mediastinum/Nodes: No mediastinal or hilar lymphadenopathy identified. Lungs/Pleura: Large layering and sub pulmonic left pleural effusion with simple fluid density has significantly increased since the thoracentesis on 12/11/2019. But no areas of left pleural nodularity or mass are identified. There is a new trace right pleural effusion. There is increasing peribronchial and confluent  right upper lung opacity. Patchy opacity was present here in January. This has an inflammatory appearance. Major airways are patent aside from atelectatic changes and respiratory motion. There is mild diffuse pulmonary septal thickening. Upper Abdomen: Negative visible liver,  spleen, pancreas, adrenal glands, kidneys and bowel in the upper abdomen. Musculoskeletal: Diffuse osseous metastatic disease in the visible skeleton. Osteolysis and pathologic fracture of the posterior left 1st rib has increased since January. Review of the MIP images confirms the above findings. IMPRESSION: 1. No acute pulmonary embolus identified. Distal right lung pulmonary branches are degraded by motion. 2. Large layering and subpulmonic left pleural effusion with significant increase since the thoracentesis on 12/11/2019. Left lung compressive atelectasis. No left pleural metastasis identified. 3. Increased dependent right upper lung opacity since January which appears inflammatory. Consider pneumonia, aspiration. 4. Mild pulmonary interstitial edema suspected. Trace new right pleural effusion. 5. Diffuse osseous metastatic disease with some progression since January, including increased lysis and pathologic fracture of the posterior left 1st rib. Electronically Signed   By: Genevie Ann M.D.   On: 12/19/2019 19:40   IR PERC PLEURAL DRAIN W/INDWELL CATH W/IMG GUIDE  Result Date: 12/20/2019 CLINICAL DATA:  Recurrent malignant left pleural effusion from metastatic breast carcinoma. EXAM: INSERTION OF TUNNELED PLEURAL DRAINAGE CATHETER ANESTHESIA/SEDATION: 2.0 mg IV Versed; 100 mcg IV Fentanyl. Total Moderate Sedation Time 24 minutes. The patient's level of consciousness and physiologic status were continuously monitored during the procedure by Radiology nursing. MEDICATIONS: 2 g IV Ancef. Antibiotic was administered in an appropriate time interval for the procedure. FLUOROSCOPY TIME:  24 seconds.  63 mGy. PROCEDURE: The procedure, risks, benefits, and alternatives were explained to the patient. Questions regarding the procedure were encouraged and answered. The patient understands and consents to the procedure. A time-out was performed prior to initiating the procedure. The left chest wall was prepped with  chlorhexidine in a sterile fashion, and a sterile drape was applied covering the operative field. A sterile gown and sterile gloves were used for the procedure. Local anesthesia was provided with 1% Lidocaine. Ultrasound image documentation was performed. Fluoroscopy during the procedure and fluoroscopic spot radiograph confirms appropriate catheter position. After creating a small skin incision, a 19 gauge needle was advanced into the pleural cavity under ultrasound guidance. A guide wire was then advanced under fluoroscopy into the pleural space. Pleural access was dilated serially and a 16-French peel-away sheath placed. A 15.5 French tunneled PleurX catheter was placed. This was tunneled from an incision 5 cm below the pleural access to the access site. The catheter was advanced through the peel-away sheath. The sheath was then removed. Final catheter positioning was confirmed with a fluoroscopic spot image. The access incision was closed with subcuticular 4-0 Vicryl. Dermabond was applied to the incision. A Prolene retention suture was applied at the catheter exit site. Large volume thoracentesis was performed through the new catheter utilizing a vacuum bottle. COMPLICATIONS: None. FINDINGS: The catheter was placed via the left posterolateral chest wall. Approximately 1 L of bloody pleural fluid was able to be removed after catheter placement. IMPRESSION: Placement of tunneled left PleurX drainage catheter into the left pleural space. 1 L of bloody pleural fluid was removed today after catheter placement. Electronically Signed   By: Aletta Edouard M.D.   On: 12/20/2019 14:04   US Thoracentesis Asp Pleural space w/IMG guide  Result Date: 12/11/2019 INDICATION: Patient with history of metastatic breast cancer, recurrent symptomatic left malignant pleural effusion. Request to IR for therapeutic left thoracentesis. EXAM:  ULTRASOUND GUIDED LEFT THORACENTESIS MEDICATIONS: 10 mL 1% lidocaine COMPLICATIONS: None  immediate. PROCEDURE: An ultrasound guided thoracentesis was thoroughly discussed with the patient and questions answered. The benefits, risks, alternatives and complications were also discussed. The patient understands and wishes to proceed with the procedure. Written consent was obtained. Ultrasound was performed to localize and mark an adequate pocket of fluid in the left chest. The area was then prepped and draped in the normal sterile fashion. 1% Lidocaine was used for local anesthesia. Under ultrasound guidance a 6 Fr Safe-T-Centesis catheter was introduced. Thoracentesis was performed. The catheter was removed and a dressing applied. FINDINGS: A total of approximately 750 mL of clear yellow fluid was removed. IMPRESSION: Successful ultrasound guided left thoracentesis yielding 750 mL of pleural fluid. Read by Candiss Norse, PA-C Electronically Signed   By: Markus Daft M.D.   On: 12/11/2019 16:16   US THORACENTESIS ASP PLEURAL SPACE W/IMG GUIDE  Result Date: 11/28/2019 INDICATION: Patient with history of metastatic breast cancer, dyspnea, recurrent left pleural effusion. Request made for therapeutic left thoracentesis. EXAM: ULTRASOUND GUIDED THERAPEUTIC LEFT THORACENTESIS MEDICATIONS: None COMPLICATIONS: None immediate. PROCEDURE: An ultrasound guided thoracentesis was thoroughly discussed with the patient and questions answered. The benefits, risks, alternatives and complications were also discussed. The patient understands and wishes to proceed with the procedure. Written consent was obtained. Ultrasound was performed to localize and mark an adequate pocket of fluid in the left chest. The area was then prepped and draped in the normal sterile fashion. 1% Lidocaine was used for local anesthesia. Under ultrasound guidance a 6 Fr Safe-T-Centesis catheter was introduced. Thoracentesis was performed. The catheter was removed and a dressing applied. FINDINGS: A total of approximately 700 cc of yellow fluid  was removed. Due to patient coughing and chest discomfort only the above amount of fluid was removed today. IMPRESSION: Successful ultrasound guided therapeutic left thoracentesis yielding 700 cc of pleural fluid. Read by: Rowe Robert, PA-C Electronically Signed   By: Sandi Mariscal M.D.   On: 11/28/2019 16:19    ASSESSMENT AND PLAN: 1.Metastatic breast cancer  CT chest 10/25/2019-left breast mass left axillary lymph nodes, abnormal appearance of the left concerning for tumor involvement, mediastinal adenopathy, diffuse sclerotic and lytic lesions, left pleural effusion  CT abdomen/pelvis 10/26/2019 enhancing left breast mass with left axillary lymphadenopathy and tumor involved the left chest wall the latissimus dorsi segment 4B liver lesion, diffuse lytic/sclerotic lesions  Ultrasound-guided biopsy of left liver lesion 10/29/1999-metastatic carcinoma, cytokeratin andGATA3positive consistent with metastatic breast cancer. HER-2 negative, ER negative, PR negative, Ki-[id number]%. PD-L1 <1% expression   [employer] hereditary panel negative  Initiated systemic chemotherapy weekly abraxane 100 mg/m2 days 1, 8, 15 q28 days   Cycle 1 day 1 on 11/14/19  Cycle 1 day 8 on 11/21/19 (hg 7.5, receive 1 unit RBCs)  Cycle 1 day 15 on 11/28/2019  Cycle 1 Adriamycin/Cytoxan 12/12/2019 2. Left upper extremity DVT-left subclavian, axillary, and brachial vein thrombosis confirmed on  Anticoagulation with apixaban, changed to parenteral hospital admission1/14/2021transition to twice daily Lovenox at discharge; transition to once daily 11/08/2019. 3.Anemia-likely secondary to metastatic breast cancer involving the bone marrow 4.Left pleural effusion-thoracentesis 11/09/2019, cytology positive for malignant cells 5.Port-A-Cath placement1/27/2021, interventional radiology 6.2D echo 11/08/2019-ejection fraction 55 to 60%, no pericardial effusion 7.Tachycardia-likely multifactorial secondary to cancer  burden, anemia, pleural effusion-improved 8.  Neutropenia secondary to chemotherapy 12/19/2019.  Prophylactic ciprofloxacin initiated. [facility] admission 12/19/2019-acute respiratory failure with hypoxia, large left pleural effusion, ?  HCAP, pancytopenia  Ms. Gerilyn Nestle  appears stable.  She is now at day 13 of her first cycle of Adriamycin and Cytoxan.  She is on IV antibiotics for possible pneumonia with plans to transition to oral antibiotics.  She remains afebrile.  White blood cell count has now recovered and she still has mild anemia and thrombocytopenia related to her recent chemotherapy,, but hemoglobin is stable and platelets are starting to trend upwards. She has persistent lymphedema to the left arm.  Recommendations: 1.  The patient will have additional teaching today regarding drainage of her Pleurx catheter.  Her niece will be here during this training.  She will continue to drain her Pleurx catheter every other day with a maximum of 1000 cc/day. 2.  The patient will go home on oral Levaquin to complete a total of 7 days of antibiotics. 3.  The patient may discharge to home today when medically stable.  She has an outpatient follow-up appointment already scheduled on 12/26/2019.  She was advised to keep this appointment.   LOS: 5 days   Mikey Bussing, DNP, AGPCNP-BC, AOCNP 12/24/19  Ms. Ow was interviewed and examined.  The white count and platelets are recovering.  She appears well.  She will continue to drain the Pleurx catheter at home.  She will return for an office visit on 12/26/2019.

## 2019-12-24 NOTE — Discharge Summary (Signed)
Physician Discharge Summary  Abigail [name] [id number] DOB: 1966-07-31  PCP: Hoyt Koch, MD  Admitted from: Home Discharged to: Home  Admit date: 12/19/2019 Discharge date: 12/24/2019  Recommendations for Outpatient Follow-up:   Follow-up Information    Hoyt Koch, MD. Schedule an appointment as soon as possible for a visit.   Specialty: Internal Medicine Contact information: [place] [address] [phone]        Ladell Pier, MD Follow up on 12/26/2019.   Specialty: Oncology Why: Keep previous appointment for labs and follow-up. Contact information: [place] [id number] [place]: Despite multiple attempts by [facility] team, unable to arrange home health RN that she ideally needed.  Patient and family have however been extensively educated regarding management of her newly placed left Pleurx catheter. Equipment/Devices: Left Pleurx catheter.  Discharge Condition: Improved and stable CODE STATUS: Full Diet recommendation: Heart healthy diet.  Discharge Diagnoses:  Principal Problem:   Acute respiratory failure with hypoxia ([place]) Active Problems:   Borderline hyperlipidemia   HCAP (healthcare-associated pneumonia)   Breast cancer [facility])   Brief Summary: 54 year old female, lives with her siblings, independent, PMH of metastatic breast cancer on chemotherapy, recurrent left pleural effusion for which she has undergone thoracentesis, hyperlipidemia, presented to [facility] from the cancer center on 12/19/2019 due to progressive dyspnea, some cough and hypoxia.  She was admitted for acute hypoxic respiratory failure due to recurrent large left pleural effusion, possible right upper lobe pneumonia and chemotherapy related pancytopenia.  Oncology consulted.  S/p left Pleurx by IR 3/11 and drained 1 L bloodstained fluid right away.  Clinically improved.  Hypoxia  resolved.   Assessment & Plan:   Acute respiratory failure with hypoxia: Due to recurrent large left pleural effusion with compressive atelectasis, suspected right upper lobe pneumonia seen on CTA chest on admission.  No PE noted.  Treated underlying cause.  Continue incentive spirometry.  Resolved.  Recurrent large left malignant pleural effusion: Suspect malignant effusion.  History of prior thoracentesis for same by IR.  S/p left Pleurx catheter by IR on 3/11 which immediately drained 1 L of fluid.  As per discussion with Dr. Learta Codding, plan to drain Pleurx every other day with a maximum of 1 L/day. RNs have trained patient and niece regarding management of Pleurx catheter by themselves at home.  This was done on 3/13 and again today day of discharge. TOC unable to arrange home health RN and hence patient and family planning to do this by themselves.  400 mL drained 3/13.  Outpatient follow-up with her oncologist.  Suspected right upper lobe pneumonia: Complicating chemotherapy related pancytopenia.  Treated with IV cefepime and completed 4 days course in the hospital.  She was continued on IV antibiotics until her [place] was >0.5 as per oncology recommendations.   As per oncology recommendations, transitioned to oral levofloxacin to complete total 7 days of antibiotics.  Discussed with patient regarding potential side effects of levofloxacin but need for this antibiotic given recent pneumonia while neutropenic and hopefully due to shorter course, should not have the side effects.  She was advised to consume OTC probiotic yogurts and she verbalized understanding.  Improved and stable.  Pancytopenia: Secondary to chemotherapy, last dose on 12/12/2019.  Patient reportedly received growth factors on 3/4.  As per oncology follow-up, no need to repeat.  Follow CBCs closely.  Neutropenic precautions.  I discussed with  Dr. Benay Spice on 11/3, she received Neulasta on 3/4 and may take 10 to 14 days before WBC  start to increase. She has a follow-up appointment with oncology on 3/17.    Leukopenia/neutropenia resolved.  Hemoglobin stable.  Thrombocytopenia improving.  I saw the patient along with oncology NP at bedside and they have cleared her for discharge home and patient has a close follow-up appointment with labs on 3/17.  Metastatic breast cancer: Per oncology.  Follow-up appreciated.  Hyperlipidemia: Continue statins.  Left upper extremity edema/DVT: Resume home dose of full dose Lovenox from 3/12.  Body mass index is 31.37 kg/m./Obesity  Hypokalemia: Replaced prior to discharge.  Continue prior home potassium supplements.  Follow-up labs on Wednesday.  Magnesium normal.   Consultants:   Medical oncology Interventional radiology  Procedures:   Left Pleurx catheter placed by IR on 3/11   Discharge Instructions  Discharge Instructions    Ambulatory Pleural Drainage Schedule   Complete by: As directed    Drain daily, up to max of 1L until patient is only able to drain out 140ml. If <113ml for 3 consecutive drains every other day, then call Interventional Radiology [phone]) for evaluation and possible removal.   Call MD for:  difficulty breathing, headache or visual disturbances   Complete by: As directed    Call MD for:  extreme fatigue   Complete by: As directed    Call MD for:  persistant dizziness or light-headedness   Complete by: As directed    Call MD for:  persistant nausea and vomiting   Complete by: As directed    Call MD for:  redness, tenderness, or signs of infection (pain, swelling, redness, odor or green/yellow discharge around incision site)   Complete by: As directed    Call MD for:  severe uncontrolled pain   Complete by: As directed    Call MD for:  temperature >100.4   Complete by: As directed    Diet - low sodium heart healthy   Complete by: As directed    Increase activity slowly   Complete by: As directed        Medication List    STOP  taking these medications   ciprofloxacin 500 MG tablet Commonly known as: Cipro   prochlorperazine 10 MG tablet Commonly known as: COMPAZINE     TAKE these medications   enoxaparin 120 MG/0.8ML injection Commonly known as: Lovenox Inject 0.73 mLs (110 mg total) into the skin daily.   guaifenesin 100 MG/5ML syrup Commonly known as: ROBITUSSIN Take 200 mg by mouth 3 (three) times daily as needed for cough.   HYDROcodone-acetaminophen 5-325 MG tablet Commonly known as: NORCO/VICODIN Take 1 tablet by mouth every 8 (eight) hours as needed for moderate pain or severe pain.   levofloxacin 750 MG tablet Commonly known as: LEVAQUIN Take 1 tablet (750 mg total) by mouth daily for 2 days. Start taking on: December 25, 2019   lidocaine-prilocaine cream Commonly known as: EMLA Apply 1 application topically as directed. Apply to port 1 hour prior to stick and cover with plastic wrap   methocarbamol 500 MG tablet Commonly known as: ROBAXIN Take 500 mg by mouth 4 (four) times daily.   ondansetron 8 MG tablet Commonly known as: ZOFRAN Take 1 tablet (8 mg total) by mouth every 8 (eight) hours as needed for nausea or vomiting.   potassium chloride SA 20 MEQ tablet Commonly known as: KLOR-CON Take 1 tablet (20 mEq total) by mouth daily.  Allergies  Allergen Reactions  . Shellfish Allergy Itching and Swelling  . Sulfamethoxazole     REACTION: swelling      Procedures/Studies: DG Chest 2 View  Result Date: 12/19/2019 CLINICAL DATA:  Dyspnea. EXAM: CHEST - 2 VIEW COMPARISON:  March 2nd 2021 FINDINGS: Redemonstration of a right internal jugular central vascular catheter port, its tip projecting on the superior vena cavoatrial junction. Similar lung inflation and subtle interstitial edema with worsened pleural fluid and dense consolidation on the left that now obscures the left heart border and continues to obscure the left hemidiaphragm. Spinal dextrocurvature, mild skeletal  degenerative change, and aortic knob calcified atherosclerosis are redemonstrated. IMPRESSION: Interval worsened left pleural effusion and consolidation, possibly malignant or bland pleural effusion with compressive atelectasis versus a left pneumonia. Stable central catheter port placement and similar persistent interstitial edema. Aortic calcified atherosclerosis. Electronically Signed   By: Revonda Humphrey   On: 12/19/2019 17:52   CT Angio Chest PE W/Cm &/Or Wo Cm  Result Date: 12/19/2019 CLINICAL DATA:  54 year old female with increased shortness of breath. Metastatic breast cancer. Recent ultrasound-guided left side thoracentesis. EXAM: CT ANGIOGRAPHY CHEST WITH CONTRAST TECHNIQUE: Multidetector CT imaging of the chest was performed using the standard protocol during bolus administration of intravenous contrast. Multiplanar CT image reconstructions and MIPs were obtained to evaluate the vascular anatomy. CONTRAST:  136mL OMNIPAQUE IOHEXOL 350 MG/ML SOLN COMPARISON:  Post thoracentesis chest radiograph 12/11/2019. Two-view chest radiographs earlier today. CTA chest 10/25/2019. FINDINGS: Cardiovascular: Good contrast bolus timing in the pulmonary arterial tree. Intermittent respiratory motion. No central or left lung pulmonary embolus. Enhancing compressed left lower lung. Distal right lung branches are obscured by motion, but no convincing pulmonary artery filling defect is identified. Stable and normal cardiac size. No pericardial effusion. Negative visible aorta. Right IJ approach Port-A-Cath now in place. Mediastinum/Nodes: No mediastinal or hilar lymphadenopathy identified. Lungs/Pleura: Large layering and sub pulmonic left pleural effusion with simple fluid density has significantly increased since the thoracentesis on 12/11/2019. But no areas of left pleural nodularity or mass are identified. There is a new trace right pleural effusion. There is increasing peribronchial and confluent right upper lung  opacity. Patchy opacity was present here in January. This has an inflammatory appearance. Major airways are patent aside from atelectatic changes and respiratory motion. There is mild diffuse pulmonary septal thickening. Upper Abdomen: Negative visible liver, spleen, pancreas, adrenal glands, kidneys and bowel in the upper abdomen. Musculoskeletal: Diffuse osseous metastatic disease in the visible skeleton. Osteolysis and pathologic fracture of the posterior left 1st rib has increased since January. Review of the MIP images confirms the above findings. IMPRESSION: 1. No acute pulmonary embolus identified. Distal right lung pulmonary branches are degraded by motion. 2. Large layering and subpulmonic left pleural effusion with significant increase since the thoracentesis on 12/11/2019. Left lung compressive atelectasis. No left pleural metastasis identified. 3. Increased dependent right upper lung opacity since January which appears inflammatory. Consider pneumonia, aspiration. 4. Mild pulmonary interstitial edema suspected. Trace new right pleural effusion. 5. Diffuse osseous metastatic disease with some progression since January, including increased lysis and pathologic fracture of the posterior left 1st rib. Electronically Signed   By: Genevie Ann M.D.   On: 12/19/2019 19:40   IR PERC PLEURAL DRAIN W/INDWELL CATH W/IMG GUIDE  Result Date: 12/20/2019 CLINICAL DATA:  Recurrent malignant left pleural effusion from metastatic breast carcinoma. EXAM: INSERTION OF TUNNELED PLEURAL DRAINAGE CATHETER ANESTHESIA/SEDATION: 2.0 mg IV Versed; 100 mcg IV Fentanyl. Total Moderate Sedation Time 24  minutes. The patient's level of consciousness and physiologic status were continuously monitored during the procedure by Radiology nursing. MEDICATIONS: 2 g IV Ancef. Antibiotic was administered in an appropriate time interval for the procedure. FLUOROSCOPY TIME:  24 seconds.  63 mGy. PROCEDURE: The procedure, risks, benefits, and  alternatives were explained to the patient. Questions regarding the procedure were encouraged and answered. The patient understands and consents to the procedure. A time-out was performed prior to initiating the procedure. The left chest wall was prepped with chlorhexidine in a sterile fashion, and a sterile drape was applied covering the operative field. A sterile gown and sterile gloves were used for the procedure. Local anesthesia was provided with 1% Lidocaine. Ultrasound image documentation was performed. Fluoroscopy during the procedure and fluoroscopic spot radiograph confirms appropriate catheter position. After creating a small skin incision, a 19 gauge needle was advanced into the pleural cavity under ultrasound guidance. A guide wire was then advanced under fluoroscopy into the pleural space. Pleural access was dilated serially and a 16-French peel-away sheath placed. A 15.5 French tunneled PleurX catheter was placed. This was tunneled from an incision 5 cm below the pleural access to the access site. The catheter was advanced through the peel-away sheath. The sheath was then removed. Final catheter positioning was confirmed with a fluoroscopic spot image. The access incision was closed with subcuticular 4-0 Vicryl. Dermabond was applied to the incision. A Prolene retention suture was applied at the catheter exit site. Large volume thoracentesis was performed through the new catheter utilizing a vacuum bottle. COMPLICATIONS: None. FINDINGS: The catheter was placed via the left posterolateral chest wall. Approximately 1 L of bloody pleural fluid was able to be removed after catheter placement. IMPRESSION: Placement of tunneled left PleurX drainage catheter into the left pleural space. 1 L of bloody pleural fluid was removed today after catheter placement. Electronically Signed   By: Aletta Edouard M.D.   On: 12/20/2019 14:04    Subjective: Patient interviewed and examined along with oncology NP at  bedside in room.  Patient denies complaints.  No dyspnea, chest pain, fever or chills reported.  Eager to get home.  Discharge Exam:  Vitals:   12/23/19 1551 12/23/19 2122 12/24/19 0452 12/24/19 0925  BP: 116/78 107/71 102/66 128/83  Pulse: (!) 102 (!) 108 97 98  Resp: 17 17 17    Temp: 99.1 F (37.3 C) 97.8 F (36.6 C) 98.6 F (37 C) 98.7 F (37.1 C)  TempSrc: Oral Oral Oral Oral  SpO2: 97% 98% 99% 97%  Weight:      Height:        General exam: Pleasant young female, moderately built and obese sitting up comfortably in chair this morning.  Appeared in good spirits. Respiratory system:  Slightly diminished breath sounds in the left base.  Otherwise clear to auscultation without wheezing or rhonchi.  No increased work of breathing. Cardiovascular system: S1 & S2 heard, RRR. No JVD, murmurs, rubs, gallops or clicks.  Diffuse left upper extremity edema, chronic but does seem better compared to the last 2 days.  Trace bilateral ankle edema.  Gastrointestinal system: Abdomen is nondistended, soft and nontender. No organomegaly or masses felt. Normal bowel sounds heard. Central nervous system: Alert and oriented.  No focal neurological deficits. Extremities: Symmetric 5 x 5 power. Skin: No rashes, lesions or ulcers Psychiatry: Judgement and insight appear normal. Mood & affect appropriate.     The results of significant diagnostics from this hospitalization (including imaging, microbiology, ancillary and laboratory) are  listed below for reference.     Microbiology: Recent Results (from the past 240 hour(s))  Culture, blood (Routine X 2) w Reflex to ID Panel     Status: None (Preliminary result)   Collection Time: 12/19/19  8:06 PM   Specimen: BLOOD  Result Value Ref Range Status   Specimen Description BLOOD RIGHT ANTECUBITAL  Final   Special Requests   Final    BOTTLES DRAWN AEROBIC AND ANAEROBIC Blood Culture results may not be optimal due to an excessive volume of blood received  in culture bottles Performed at [facility], [place] [address] [place], [place] [address]    Culture NO GROWTH 4 DAYS  Final   Report Status PENDING  Incomplete  SARS CORONAVIRUS 2 (TAT 6-24 HRS) Nasopharyngeal Nasopharyngeal Swab     Status: None   Collection Time: 12/19/19  8:30 PM   Specimen: Nasopharyngeal Swab  Result Value Ref Range Status   SARS Coronavirus 2 NEGATIVE NEGATIVE Final    Comment: (NOTE) SARS-CoV-2 target nucleic acids are NOT DETECTED. The SARS-CoV-2 RNA is generally detectable in upper and lower respiratory specimens during the acute phase of infection. Negative results do not preclude SARS-CoV-2 infection, do not rule out co-infections with other pathogens, and should not be used as the sole basis for treatment or other patient management decisions. Negative results must be combined with clinical observations, patient history, and epidemiological information. The expected result is Negative. Fact Sheet for Patients: SugarRoll.be Fact Sheet for Healthcare Providers: https://www.woods-mathews.com/ This test is not yet approved or cleared by the [place] FDA and  has been authorized for detection and/or diagnosis of SARS-CoV-2 by FDA under an Emergency Use Authorization (EUA). This EUA will remain  in effect (meaning this test can be used) for the duration of the COVID-19 declaration under Section 56 4(b)(1) of the Act, 21 U.S.C. section 360bbb-3(b)(1), unless the authorization is terminated or revoked sooner. Performed at [facility], [place] [address], [place], [place] [address]   Culture, blood (Routine X 2) w Reflex to ID Panel     Status: None (Preliminary result)   Collection Time: 12/19/19  9:34 PM   Specimen: BLOOD RIGHT HAND  Result Value Ref Range Status   Specimen Description BLOOD RIGHT HAND  Final   Special Requests   Final    BOTTLES DRAWN AEROBIC AND ANAEROBIC Blood Culture  adequate volume Performed at [place] [address] [place], [place] [address]    Culture NO GROWTH 4 DAYS  Final   Report Status PENDING  Incomplete     Labs: CBC: Recent Labs  Lab 12/19/19 0851 12/19/19 1702 12/20/19 0451 12/21/19 0403 12/22/19 0409 12/23/19 0300 12/23/19 2330  WBC 0.6*   < > 0.7* 0.7* 1.5* 2.8* 5.6  NEUTROABS 0.1*  --   --  0.2* 0.2* 1.4* 3.8  HGB 7.2*   < > 9.9* 9.3* 9.1* 8.5* 8.6*  HCT 24.4*   < > 31.9* 30.0* 29.4* 27.6* 28.7*  MCV 91.4   < > 91.7 92.0 91.3 91.4 93.2  PLT 119*   < > 110* 71* 54* 55* 69*   < > = values in this interval not displayed.    Basic Metabolic Panel: Recent Labs  Lab 12/19/19 1702 12/20/19 0451 12/21/19 0403 12/23/19 2330  NA 140 140 140 139  K 3.9 3.8 3.5 3.1*  CL 104 105 104 103  CO2 24 27 27 29   GLUCOSE 92 92 100* 93  BUN  9 7 12 12   CREATININE 0.36* 0.38* 0.50 0.47  CALCIUM 8.6* 8.5* 8.2* 8.0*  MG  --   --   --  1.9    Liver Function Tests: Recent Labs  Lab 12/20/19 0451  AST 15  ALT 20  ALKPHOS 205*  BILITOT 0.8  PROT 6.1*  ALBUMIN 2.7*    Discussed in detail with patient's niece via patient's video phone at bedside, updated care and answered questions.  Time coordinating discharge: 40 minutes  SIGNED:  Vernell Leep, MD, [place], [facility]. [facility]  To contact the attending provider between 7A-7P or the covering provider during after hours 7P-7A, please log into the web site [email or web address] and access using universal [place] password for that web site. If you do not have the password, please call the hospital operator.

## 2019-12-24 NOTE — Progress Notes (Signed)
Pt's Niece at the bedside Abigail [name] for teaching regarding Pleurx drain and procedure for draining the catheter. Printed instructions reviewed with Pt and Pt's Niece and Niece watched the procedure for draining the Pluerx catheter. 650 cc serosanguinous fluid drained off. Pt's Niece voices understanding of the procedure and states "I feel more comfortable today about doing this at home" Reassurance given to Pt and Pt's Niece.

## 2019-12-25 ENCOUNTER — Telehealth: Payer: Self-pay | Admitting: *Deleted

## 2019-12-25 LAB — CULTURE, BLOOD (ROUTINE X 2)
Culture: NO GROWTH
Culture: NO GROWTH
Special Requests: ADEQUATE

## 2019-12-25 NOTE — Telephone Encounter (Signed)
Pt was on TCM report admitted 12/19/19 for acute hypoxic respiratory failure due to recurrent large left pleural effusion, possible (R) upper lobe pneumonia and chemotherapy. Left pleurx drained 1 L bloodstained fluid. Pt D/C 12/24/19, nad will f/u with oncologist also having labs on 12/26/19

## 2019-12-26 ENCOUNTER — Inpatient Hospital Stay: Payer: 59

## 2019-12-26 ENCOUNTER — Other Ambulatory Visit: Payer: Self-pay

## 2019-12-26 ENCOUNTER — Encounter: Payer: Self-pay | Admitting: Nurse Practitioner

## 2019-12-26 ENCOUNTER — Telehealth: Payer: Self-pay

## 2019-12-26 ENCOUNTER — Inpatient Hospital Stay: Payer: 59 | Admitting: Nurse Practitioner

## 2019-12-26 ENCOUNTER — Telehealth: Payer: Self-pay | Admitting: Oncology

## 2019-12-26 VITALS — BP 123/80 | HR 96 | Temp 98.2°F | Resp 18 | Ht 61.0 in | Wt 154.4 lb

## 2019-12-26 DIAGNOSIS — Z95828 Presence of other vascular implants and grafts: Secondary | ICD-10-CM | POA: Diagnosis not present

## 2019-12-26 DIAGNOSIS — R110 Nausea: Secondary | ICD-10-CM | POA: Diagnosis not present

## 2019-12-26 DIAGNOSIS — C50412 Malignant neoplasm of upper-outer quadrant of left female breast: Secondary | ICD-10-CM

## 2019-12-26 DIAGNOSIS — Z5189 Encounter for other specified aftercare: Secondary | ICD-10-CM | POA: Diagnosis not present

## 2019-12-26 DIAGNOSIS — Z5111 Encounter for antineoplastic chemotherapy: Secondary | ICD-10-CM | POA: Diagnosis present

## 2019-12-26 DIAGNOSIS — D701 Agranulocytosis secondary to cancer chemotherapy: Secondary | ICD-10-CM | POA: Diagnosis not present

## 2019-12-26 DIAGNOSIS — C7952 Secondary malignant neoplasm of bone marrow: Secondary | ICD-10-CM | POA: Diagnosis not present

## 2019-12-26 DIAGNOSIS — R05 Cough: Secondary | ICD-10-CM | POA: Diagnosis not present

## 2019-12-26 DIAGNOSIS — J90 Pleural effusion, not elsewhere classified: Secondary | ICD-10-CM | POA: Diagnosis not present

## 2019-12-26 DIAGNOSIS — Z20822 Contact with and (suspected) exposure to covid-19: Secondary | ICD-10-CM | POA: Diagnosis not present

## 2019-12-26 DIAGNOSIS — C50912 Malignant neoplasm of unspecified site of left female breast: Secondary | ICD-10-CM | POA: Diagnosis present

## 2019-12-26 DIAGNOSIS — D649 Anemia, unspecified: Secondary | ICD-10-CM | POA: Diagnosis not present

## 2019-12-26 DIAGNOSIS — R0902 Hypoxemia: Secondary | ICD-10-CM | POA: Diagnosis not present

## 2019-12-26 DIAGNOSIS — Z171 Estrogen receptor negative status [ER-]: Secondary | ICD-10-CM

## 2019-12-26 LAB — CBC WITH DIFFERENTIAL (CANCER CENTER ONLY)
Abs Immature Granulocytes: 2.61 10*3/uL — ABNORMAL HIGH (ref 0.00–0.07)
Basophils Absolute: 0 10*3/uL (ref 0.0–0.1)
Basophils Relative: 0 %
Eosinophils Absolute: 0 10*3/uL (ref 0.0–0.5)
Eosinophils Relative: 0 %
HCT: 30.4 % — ABNORMAL LOW (ref 36.0–46.0)
Hemoglobin: 9.2 g/dL — ABNORMAL LOW (ref 12.0–15.0)
Immature Granulocytes: 17 %
Lymphocytes Relative: 6 %
Lymphs Abs: 0.9 10*3/uL (ref 0.7–4.0)
MCH: 27.8 pg (ref 26.0–34.0)
MCHC: 30.3 g/dL (ref 30.0–36.0)
MCV: 91.8 fL (ref 80.0–100.0)
Monocytes Absolute: 1.7 10*3/uL — ABNORMAL HIGH (ref 0.1–1.0)
Monocytes Relative: 11 %
Neutro Abs: 9.9 10*3/uL — ABNORMAL HIGH (ref 1.7–7.7)
Neutrophils Relative %: 66 %
Platelet Count: 91 10*3/uL — ABNORMAL LOW (ref 150–400)
RBC: 3.31 MIL/uL — ABNORMAL LOW (ref 3.87–5.11)
RDW: 16.3 % — ABNORMAL HIGH (ref 11.5–15.5)
WBC Count: 15.1 10*3/uL — ABNORMAL HIGH (ref 4.0–10.5)
nRBC: 0.7 % — ABNORMAL HIGH (ref 0.0–0.2)

## 2019-12-26 LAB — CMP (CANCER CENTER ONLY)
ALT: 18 U/L (ref 0–44)
AST: 17 U/L (ref 15–41)
Albumin: 2.8 g/dL — ABNORMAL LOW (ref 3.5–5.0)
Alkaline Phosphatase: 351 U/L — ABNORMAL HIGH (ref 38–126)
Anion gap: 8 (ref 5–15)
BUN: 7 mg/dL (ref 6–20)
CO2: 28 mmol/L (ref 22–32)
Calcium: 8.5 mg/dL — ABNORMAL LOW (ref 8.9–10.3)
Chloride: 106 mmol/L (ref 98–111)
Creatinine: 0.6 mg/dL (ref 0.44–1.00)
GFR, Est AFR Am: >60 mL/min (ref >60)
GFR, Estimated: >60 mL/min (ref >60)
Glucose, Bld: 87 mg/dL (ref 70–99)
Potassium: 3.8 mmol/L (ref 3.5–5.1)
Sodium: 142 mmol/L (ref 135–145)
Total Bilirubin: 0.3 mg/dL (ref 0.3–1.2)
Total Protein: 6.3 g/dL — ABNORMAL LOW (ref 6.5–8.1)

## 2019-12-26 MED ORDER — SODIUM CHLORIDE 0.9% FLUSH
10.0000 mL | Freq: Once | INTRAVENOUS | Status: AC
  Administered 2019-12-26: 10 mL
  Filled 2019-12-26: qty 10

## 2019-12-26 MED ORDER — HEPARIN SOD (PORK) LOCK FLUSH 100 UNIT/ML IV SOLN
500.0000 [IU] | Freq: Once | INTRAVENOUS | Status: AC
  Administered 2019-12-26: 500 [IU]
  Filled 2019-12-26: qty 5

## 2019-12-26 NOTE — Telephone Encounter (Signed)
Scheduled per los. Patient declined printout  

## 2019-12-26 NOTE — Telephone Encounter (Signed)
Spoke with patient regarding [facility] referral

## 2019-12-26 NOTE — Progress Notes (Addendum)
[facility] OFFICE PROGRESS NOTE   Diagnosis: Breast cancer  INTERVAL HISTORY:   [facility] [name] returns as scheduled.  She completed cycle 1 Adriamycin/Cytoxan 12/12/2019.  She was hospitalized 12/19/2019 with increased shortness of breath, neutropenia.  She was placed on IV antibiotics for possible pneumonia.  She remained afebrile.  The white blood cell count recovered.  She underwent placement of a Pleurx catheter 12/20/2019.  She was discharged home 12/24/2019.  She reports the Pleurx was last drained on Monday prior to hospital discharge.  She denies shortness of breath at present.  No cough or fever.  No nausea or vomiting.  No constipation or diarrhea.  Left arm is less edematous.  She reports good oral intake.  Objective:  Vital signs in last 24 hours:  Blood pressure 123/80, pulse 96, temperature 98.2 F (36.8 C), temperature source Temporal, resp. rate 18, height '5\' 1"'  (1.549 m), weight 154 lb 6.4 oz (70 kg), SpO2 100 %.    HEENT: No thrush or ulcers. Resp: Breath sounds diminished left lower lung field.  No respiratory distress. Cardio: Regular rate and rhythm. GI: No hepatomegaly. Vascular: No leg edema. Neuro: Alert and oriented. Skin: Improved nodularity left anterior axillary fold/left upper back. Port-A-Cath without erythema.   Lab Results:  Lab Results  Component Value Date   WBC 15.1 (H) 12/26/2019   HGB 9.2 (L) 12/26/2019   HCT 30.4 (L) 12/26/2019   MCV 91.8 12/26/2019   PLT 91 (L) 12/26/2019   NEUTROABS 9.9 (H) 12/26/2019    Imaging:  No results found.  Medications: I have reviewed the patient's current medications.  Assessment/Plan: 1.Metastatic breast cancer  CT chest 10/25/2019-left breast mass left axillary lymph nodes, abnormal appearance of the left concerning for tumor involvement, mediastinal adenopathy, diffuse sclerotic and lytic lesions, left pleural effusion  CT abdomen/pelvis 10/26/2019 enhancing left breast mass with left  axillary lymphadenopathy and tumor involved the left chest wall the latissimus dorsi segment 4B liver lesion, diffuse lytic/sclerotic lesions  Ultrasound-guided biopsy of left liver lesion 10/29/1999-metastatic carcinoma, cytokeratin andGATA3positive consistent with metastatic breast cancer. HER-2 negative, ER negative, PR negative, Ki-6720%. PD-L1 <1% expression   Invitae hereditary panel negative  Initiated systemic chemotherapy weekly abraxane 100 mg/m2 days 1, 8, 15 q28 days   Cycle 1 day 1 on 11/14/19  Cycle 1 day 8 on 11/21/19 (hg 7.5, receive 1 unit RBCs)  Cycle 1 day 15 on 11/28/2019  Cycle 1 Adriamycin/Cytoxan 12/12/2019 2. Left upper extremity DVT-left subclavian, axillary, and brachial vein thrombosis confirmed on  Anticoagulation with apixaban, changed to parenteral hospital admission1/14/2021transition to twice daily Lovenox at discharge; transition to once daily 11/08/2019. 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 Prophylactic ciprofloxacin initiated. [facility] admission 12/19/2019-acute respiratory failure with hypoxia, large left pleural effusion, ?  HCAP, pancytopenia   Disposition: Abigail [name] appears stable.  She completed cycle 1 Adriamycin/Cytoxan on 12/12/2019.  She received white cell growth factor support.  She subsequently developed neutropenia.  We reviewed the CBC from today.  The white count has recovered.  Platelet count is better.  We will schedule her for cycle 2 Adriamycin/Cytoxan in 1 week.  She has a  malignant left pleural effusion.  She had a Pleurx catheter placed during the  recent hospitalization.  Her niece/caregiver has questions regarding the drainage procedure.  We contacted radiology.  They will meet with them this afternoon to go over the procedure.  She will return for lab, follow-up, cycle 2 Adriamycin/Cytoxan in 1 week.  She will contact the office in the interim with any problems.  Patient seen with Dr. Benay Spice.   Ned Card ANP/GNP-BC   12/26/2019  10:27 AM  This was a shared visit with Ned Card.  Abigail [name] was interviewed and examined.  The left arm edema and chest wall nodularity has improved.  The white count and platelets are recovering following cycle 1 AC.  [name], MD

## 2019-12-30 ENCOUNTER — Other Ambulatory Visit: Payer: Self-pay | Admitting: Oncology

## 2020-01-02 ENCOUNTER — Inpatient Hospital Stay: Payer: 59

## 2020-01-02 ENCOUNTER — Other Ambulatory Visit: Payer: Self-pay

## 2020-01-02 ENCOUNTER — Inpatient Hospital Stay (HOSPITAL_BASED_OUTPATIENT_CLINIC_OR_DEPARTMENT_OTHER): Payer: 59 | Admitting: Nurse Practitioner

## 2020-01-02 ENCOUNTER — Encounter: Payer: Self-pay | Admitting: Nurse Practitioner

## 2020-01-02 VITALS — BP 129/84 | HR 83 | Temp 98.7°F | Resp 18 | Ht 61.0 in | Wt 151.6 lb

## 2020-01-02 DIAGNOSIS — C50412 Malignant neoplasm of upper-outer quadrant of left female breast: Secondary | ICD-10-CM

## 2020-01-02 DIAGNOSIS — Z171 Estrogen receptor negative status [ER-]: Secondary | ICD-10-CM

## 2020-01-02 DIAGNOSIS — Z5111 Encounter for antineoplastic chemotherapy: Secondary | ICD-10-CM | POA: Diagnosis not present

## 2020-01-02 LAB — CBC WITH DIFFERENTIAL (CANCER CENTER ONLY)
Abs Immature Granulocytes: 2.43 10*3/uL — ABNORMAL HIGH (ref 0.00–0.07)
Basophils Absolute: 0.1 10*3/uL (ref 0.0–0.1)
Basophils Relative: 0 %
Eosinophils Absolute: 0 10*3/uL (ref 0.0–0.5)
Eosinophils Relative: 0 %
HCT: 32.5 % — ABNORMAL LOW (ref 36.0–46.0)
Hemoglobin: 9.8 g/dL — ABNORMAL LOW (ref 12.0–15.0)
Immature Granulocytes: 17 %
Lymphocytes Relative: 10 %
Lymphs Abs: 1.4 10*3/uL (ref 0.7–4.0)
MCH: 28.1 pg (ref 26.0–34.0)
MCHC: 30.2 g/dL (ref 30.0–36.0)
MCV: 93.1 fL (ref 80.0–100.0)
Monocytes Absolute: 3 10*3/uL — ABNORMAL HIGH (ref 0.1–1.0)
Monocytes Relative: 21 %
Neutro Abs: 7.4 10*3/uL (ref 1.7–7.7)
Neutrophils Relative %: 52 %
Platelet Count: 182 10*3/uL (ref 150–400)
RBC: 3.49 MIL/uL — ABNORMAL LOW (ref 3.87–5.11)
RDW: 17.3 % — ABNORMAL HIGH (ref 11.5–15.5)
WBC Count: 14.3 10*3/uL — ABNORMAL HIGH (ref 4.0–10.5)
nRBC: 12.9 % — ABNORMAL HIGH (ref 0.0–0.2)

## 2020-01-02 LAB — CMP (CANCER CENTER ONLY)
ALT: 22 U/L (ref 0–44)
AST: 24 U/L (ref 15–41)
Albumin: 3.3 g/dL — ABNORMAL LOW (ref 3.5–5.0)
Alkaline Phosphatase: 503 U/L — ABNORMAL HIGH (ref 38–126)
Anion gap: 8 (ref 5–15)
BUN: 9 mg/dL (ref 6–20)
CO2: 28 mmol/L (ref 22–32)
Calcium: 8.9 mg/dL (ref 8.9–10.3)
Chloride: 107 mmol/L (ref 98–111)
Creatinine: 0.63 mg/dL (ref 0.44–1.00)
GFR, Est AFR Am: >60 mL/min (ref >60)
GFR, Estimated: >60 mL/min (ref >60)
Glucose, Bld: 85 mg/dL (ref 70–99)
Potassium: 4.3 mmol/L (ref 3.5–5.1)
Sodium: 143 mmol/L (ref 135–145)
Total Bilirubin: 0.3 mg/dL (ref 0.3–1.2)
Total Protein: 6.8 g/dL (ref 6.5–8.1)

## 2020-01-02 MED ORDER — DEXAMETHASONE SODIUM PHOSPHATE 10 MG/ML IJ SOLN
10.0000 mg | Freq: Once | INTRAMUSCULAR | Status: AC
  Administered 2020-01-02: 10 mg via INTRAVENOUS

## 2020-01-02 MED ORDER — HEPARIN SOD (PORK) LOCK FLUSH 100 UNIT/ML IV SOLN
500.0000 [IU] | Freq: Once | INTRAVENOUS | Status: AC | PRN
  Administered 2020-01-02: 500 [IU]
  Filled 2020-01-02: qty 5

## 2020-01-02 MED ORDER — SODIUM CHLORIDE 0.9 % IV SOLN
150.0000 mg | Freq: Once | INTRAVENOUS | Status: AC
  Administered 2020-01-02: 150 mg via INTRAVENOUS
  Filled 2020-01-02: qty 150

## 2020-01-02 MED ORDER — DEXAMETHASONE SODIUM PHOSPHATE 10 MG/ML IJ SOLN
INTRAMUSCULAR | Status: AC
  Filled 2020-01-02: qty 1

## 2020-01-02 MED ORDER — SODIUM CHLORIDE 0.9% FLUSH
10.0000 mL | INTRAVENOUS | Status: DC | PRN
  Administered 2020-01-02: 10 mL
  Filled 2020-01-02: qty 10

## 2020-01-02 MED ORDER — PALONOSETRON HCL INJECTION 0.25 MG/5ML
INTRAVENOUS | Status: AC
  Filled 2020-01-02: qty 5

## 2020-01-02 MED ORDER — DOXORUBICIN HCL CHEMO IV INJECTION 2 MG/ML
50.0000 mg/m2 | Freq: Once | INTRAVENOUS | Status: AC
  Administered 2020-01-02: 92 mg via INTRAVENOUS
  Filled 2020-01-02: qty 46

## 2020-01-02 MED ORDER — PALONOSETRON HCL INJECTION 0.25 MG/5ML
0.2500 mg | Freq: Once | INTRAVENOUS | Status: AC
  Administered 2020-01-02: 0.25 mg via INTRAVENOUS

## 2020-01-02 MED ORDER — SODIUM CHLORIDE 0.9 % IV SOLN
500.0000 mg/m2 | Freq: Once | INTRAVENOUS | Status: AC
  Administered 2020-01-02: 920 mg via INTRAVENOUS
  Filled 2020-01-02: qty 46

## 2020-01-02 MED ORDER — SODIUM CHLORIDE 0.9 % IV SOLN
Freq: Once | INTRAVENOUS | Status: AC
  Filled 2020-01-02: qty 250

## 2020-01-02 NOTE — Patient Instructions (Signed)

## 2020-01-02 NOTE — Progress Notes (Addendum)
[place] OFFICE PROGRESS NOTE   Diagnosis: Breast cancer  INTERVAL HISTORY:   Abigail [name] returns as scheduled.  She completed cycle 1 Adriamycin/Cytoxan 12/12/2019.  She continues to feel better.  No fever, cough, shortness of breath.  Skin nodules continue to improve.  She reports a good appetite.  She denies pain.  She notes less swelling of the left hand/arm.  She last drained the Pleurx 1 week ago.  Objective:  Vital signs in last 24 hours:  Blood pressure 129/84, pulse 83, temperature 98.7 F (37.1 C), temperature source Temporal, resp. rate 18, height '5\' 1"'  (1.549 m), weight 151 lb 9.6 oz (68.8 kg), SpO2 100 %.    HEENT: No thrush or ulcers. Resp: Breath sounds diminished left lower one half.  No respiratory distress.  Pleurx catheter. Cardio: Regular rate and rhythm. GI: Abdomen soft and nontender.  No hepatomegaly. Vascular: No leg edema.  Left hand/arm with trace edema. Neuro: Alert and oriented. Skin: Nodularity left anterior axillary fold/left upper back stable to improved. Port-A-Cath without erythema.  Lab Results:  Lab Results  Component Value Date   WBC 14.3 (H) 01/02/2020   HGB 9.8 (L) 01/02/2020   HCT 32.5 (L) 01/02/2020   MCV 93.1 01/02/2020   PLT 182 01/02/2020   NEUTROABS PENDING 01/02/2020    Imaging:  No results found.  Medications: I have reviewed the patient's current medications.  Assessment/Plan: 1.Metastatic breast cancer  CT chest 10/25/2019-left breast mass left axillary lymph nodes, abnormal appearance of the left concerning for tumor involvement, mediastinal adenopathy, diffuse sclerotic and lytic lesions, left pleural effusion  CT abdomen/pelvis 10/26/2019 enhancing left breast mass with left axillary lymphadenopathy and tumor involved the left chest wall the latissimus dorsi segment 4B liver lesion, diffuse lytic/sclerotic lesions  Ultrasound-guided biopsy of left liver lesion 10/29/1999-metastatic carcinoma,  cytokeratin andGATA3positive consistent with metastatic breast cancer. HER-2 negative, ER negative, PR negative, Ki-6720%. PD-L1 <1% expression   Invitae hereditary panel negative  Initiated systemic chemotherapy weekly abraxane 100 mg/m2 days 1, 8, 15 q28 days   Cycle 1 day 1 on 11/14/19  Cycle 1 day 8 on 11/21/19 (hg 7.5, receive 1 unit RBCs)  Cycle 1 day 15 on 11/28/2019  Cycle 1 Adriamycin/Cytoxan 12/12/2019 2. Left upper extremity DVT-left subclavian, axillary, and brachial vein thrombosis confirmed on  Anticoagulation with apixaban, changed to parenteral hospital admission1/14/2021transition to twice daily Lovenox at discharge; transition to once daily 11/08/2019. 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 Prophylactic ciprofloxacin initiated. [facility] admission 12/19/2019-acute respiratory failure with hypoxia, large left pleural effusion, ? HCAP, pancytopenia  Disposition: [name] has completed 1 cycle of Adriamycin/Cytoxan.  Her clinical status has improved.  Plan to proceed with cycle 2 today as scheduled.  Chemotherapy doses have been reduced due to neutropenia following cycle 1.  She will again receive Udenyca.  We reviewed the CBC from today.  Counts adequate to proceed with treatment.    She will drain the Pleurx 3 times a week.  She will return for lab and follow-up in approximately 2 weeks.  She will contact the office in the interim with any problems.  Patient seen with Dr. Benay Spice.    Ned Card  ANP/GNP-BC   01/02/2020  12:48 PM This  was a shared visit with Ned Card.  Ms. State has an improved performance status.  Dyspnea and left arm edema have improved.  She will complete cycle 2 of AC chemotherapy today.  She will continue draining the Pleurx at home.  [name], MD

## 2020-01-02 NOTE — Patient Instructions (Signed)
[facility] Discharge Instructions for Patients Receiving Chemotherapy  Today you received the following chemotherapy agents Adriamycin; Cytoxin  To help prevent nausea and vomiting after your treatment, we encourage you to take your nausea medication as directed   If you develop nausea and vomiting that is not controlled by your nausea medication, call the clinic.   BELOW ARE SYMPTOMS THAT SHOULD BE REPORTED IMMEDIATELY:  *FEVER GREATER THAN 100.5 F  *CHILLS WITH OR WITHOUT FEVER  NAUSEA AND VOMITING THAT IS NOT CONTROLLED WITH YOUR NAUSEA MEDICATION  *UNUSUAL SHORTNESS OF BREATH  *UNUSUAL BRUISING OR BLEEDING  TENDERNESS IN MOUTH AND THROAT WITH OR WITHOUT PRESENCE OF ULCERS  *URINARY PROBLEMS  *BOWEL PROBLEMS  UNUSUAL RASH Items with * indicate a potential emergency and should be followed up as soon as possible.  Feel free to call the clinic should you have any questions or concerns. The clinic phone number is [phone].  Please show the [place] at check-in to the Emergency Department and triage nurse.

## 2020-01-04 ENCOUNTER — Other Ambulatory Visit: Payer: Self-pay

## 2020-01-04 ENCOUNTER — Telehealth: Payer: Self-pay | Admitting: Oncology

## 2020-01-04 ENCOUNTER — Inpatient Hospital Stay: Payer: 59

## 2020-01-04 VITALS — BP 109/70 | HR 72 | Temp 98.3°F | Resp 18

## 2020-01-04 DIAGNOSIS — C50412 Malignant neoplasm of upper-outer quadrant of left female breast: Secondary | ICD-10-CM

## 2020-01-04 DIAGNOSIS — Z5111 Encounter for antineoplastic chemotherapy: Secondary | ICD-10-CM | POA: Diagnosis not present

## 2020-01-04 MED ORDER — PEGFILGRASTIM-CBQV 6 MG/0.6ML ~~LOC~~ SOSY
6.0000 mg | PREFILLED_SYRINGE | Freq: Once | SUBCUTANEOUS | Status: AC
  Administered 2020-01-04: 6 mg via SUBCUTANEOUS

## 2020-01-04 MED ORDER — PEGFILGRASTIM-CBQV 6 MG/0.6ML ~~LOC~~ SOSY
PREFILLED_SYRINGE | SUBCUTANEOUS | Status: AC
  Filled 2020-01-04: qty 0.6

## 2020-01-04 NOTE — Patient Instructions (Signed)

## 2020-01-04 NOTE — Telephone Encounter (Signed)
Scheduled per los. Called and left msg. Mailed printout  °

## 2020-01-15 ENCOUNTER — Other Ambulatory Visit: Payer: Self-pay | Admitting: *Deleted

## 2020-01-15 ENCOUNTER — Inpatient Hospital Stay: Payer: 59 | Attending: Oncology | Admitting: Oncology

## 2020-01-15 ENCOUNTER — Inpatient Hospital Stay: Payer: 59

## 2020-01-15 ENCOUNTER — Other Ambulatory Visit: Payer: Self-pay

## 2020-01-15 VITALS — BP 112/84 | HR 87 | Temp 98.0°F | Resp 18 | Ht 61.0 in | Wt 150.6 lb

## 2020-01-15 DIAGNOSIS — Z171 Estrogen receptor negative status [ER-]: Secondary | ICD-10-CM

## 2020-01-15 DIAGNOSIS — C50912 Malignant neoplasm of unspecified site of left female breast: Secondary | ICD-10-CM | POA: Insufficient documentation

## 2020-01-15 DIAGNOSIS — Z5111 Encounter for antineoplastic chemotherapy: Secondary | ICD-10-CM | POA: Diagnosis present

## 2020-01-15 DIAGNOSIS — C50412 Malignant neoplasm of upper-outer quadrant of left female breast: Secondary | ICD-10-CM

## 2020-01-15 DIAGNOSIS — D701 Agranulocytosis secondary to cancer chemotherapy: Secondary | ICD-10-CM | POA: Insufficient documentation

## 2020-01-15 DIAGNOSIS — Z95828 Presence of other vascular implants and grafts: Secondary | ICD-10-CM

## 2020-01-15 LAB — CBC WITH DIFFERENTIAL (CANCER CENTER ONLY)
Abs Immature Granulocytes: 3.24 10*3/uL — ABNORMAL HIGH (ref 0.00–0.07)
Basophils Absolute: 0 10*3/uL (ref 0.0–0.1)
Basophils Relative: 0 %
Eosinophils Absolute: 0.2 10*3/uL (ref 0.0–0.5)
Eosinophils Relative: 1 %
HCT: 25.8 % — ABNORMAL LOW (ref 36.0–46.0)
Hemoglobin: 7.9 g/dL — ABNORMAL LOW (ref 12.0–15.0)
Immature Granulocytes: 21 %
Lymphocytes Relative: 6 %
Lymphs Abs: 0.9 10*3/uL (ref 0.7–4.0)
MCH: 27.9 pg (ref 26.0–34.0)
MCHC: 30.6 g/dL (ref 30.0–36.0)
MCV: 91.2 fL (ref 80.0–100.0)
Monocytes Absolute: 1.6 10*3/uL — ABNORMAL HIGH (ref 0.1–1.0)
Monocytes Relative: 11 %
Neutro Abs: 9.3 10*3/uL — ABNORMAL HIGH (ref 1.7–7.7)
Neutrophils Relative %: 61 %
Platelet Count: 107 10*3/uL — ABNORMAL LOW (ref 150–400)
RBC: 2.83 MIL/uL — ABNORMAL LOW (ref 3.87–5.11)
RDW: 17 % — ABNORMAL HIGH (ref 11.5–15.5)
WBC Count: 15.3 10*3/uL — ABNORMAL HIGH (ref 4.0–10.5)
nRBC: 4.2 % — ABNORMAL HIGH (ref 0.0–0.2)

## 2020-01-15 MED ORDER — HEPARIN SOD (PORK) LOCK FLUSH 100 UNIT/ML IV SOLN
500.0000 [IU] | Freq: Once | INTRAVENOUS | Status: AC
  Administered 2020-01-15: 500 [IU]
  Filled 2020-01-15: qty 5

## 2020-01-15 MED ORDER — SODIUM CHLORIDE 0.9% FLUSH
10.0000 mL | Freq: Once | INTRAVENOUS | Status: AC
  Administered 2020-01-15: 10 mL
  Filled 2020-01-15: qty 10

## 2020-01-15 MED ORDER — DEXAMETHASONE 4 MG PO TABS: ORAL_TABLET | ORAL | 1 refills | Status: DC

## 2020-01-15 MED ORDER — RIVAROXABAN 20 MG PO TABS: 20.0000 mg | ORAL_TABLET | Freq: Every day | ORAL | 1 refills | Status: DC

## 2020-01-15 NOTE — Progress Notes (Signed)
[place] OFFICE PROGRESS NOTE   Diagnosis: Breast cancer  INTERVAL HISTORY:   A[facility]ail [name] completed another cycle of Adriamycin/Cytoxan on 01/02/2020.  She reports nausea for several days following chemotherapy.  No emesis.  The left arm and left breast edema are much improved.  She continues to drain the Pleurx approximately twice weekly.  There are is 3 to 400 cc of drainage each time.  The drainage is nonbloody.  Objective:  Vital signs in last 24 hours:  Blood pressure 112/84, pulse 87, temperature 98 F (36.7 C), temperature source Oral, resp. rate 18, height '5\' 1"'  (1.549 m), weight 150 lb 9.6 oz (68.3 kg), SpO2 100 %.     Resp: Diminished breath sounds with end inspiratory rub throughout the left posterior chest, no respiratory distress Cardio: Regular rate and rhythm GI: No hepatomegaly Vascular: No leg edema.  Trace edema throughout the left arm, no hand edema.  Skin: Decreased skin thickening and nodularity at the left upper back and left axilla. Lymph nodes: No cervical or supraclavicular nodes Breast: Edema at the lower lateral left breast without a discrete mass  Portacath/PICC-without erythema  Lab Results:  Lab Results  Component Value Date   WBC 15.3 (H) 01/15/2020   HGB 7.9 (L) 01/15/2020   HCT 25.8 (L) 01/15/2020   MCV 91.2 01/15/2020   PLT 107 (L) 01/15/2020   NEUTROABS 9.3 (H) 01/15/2020    CMP  Lab Results  Component Value Date   NA 143 01/02/2020   K 4.3 01/02/2020   CL 107 01/02/2020   CO2 28 01/02/2020   GLUCOSE 85 01/02/2020   BUN 9 01/02/2020   CREATININE 0.63 01/02/2020   CALCIUM 8.9 01/02/2020   PROT 6.8 01/02/2020   ALBUMIN 3.3 (L) 01/02/2020   AST 24 01/02/2020   ALT 22 01/02/2020   ALKPHOS 503 (H) 01/02/2020   BILITOT 0.3 01/02/2020   GFRNONAA >60 01/02/2020   GFRAA >60 01/02/2020     Medications: I have reviewed the patient's current medications.   Assessment/Plan: 1.Metastatic breast cancer  CT  chest 10/25/2019-left breast mass left axillary lymph nodes, abnormal appearance of the left concerning for tumor involvement, mediastinal adenopathy, diffuse sclerotic and lytic lesions, left pleural effusion  CT abdomen/pelvis 10/26/2019 enhancing left breast mass with left axillary lymphadenopathy and tumor involved the left chest wall the latissimus dorsi segment 4B liver lesion, diffuse lytic/sclerotic lesions  Ultrasound-guided biopsy of left liver lesion 10/29/1999-metastatic carcinoma, cytokeratin andGATA3positive consistent with metastatic breast cancer. HER-2 negative, ER negative, PR negative, Ki-6720%. PD-L1 <1% expression   Invitae hereditary panel negative  Initiated systemic chemotherapy weekly abraxane 100 mg/m2 days 1, 8, 15 q28 days   Cycle 1 day 1 on 11/14/19  Cycle 1 day 8 on 11/21/19 (hg 7.5, receive 1 unit RBCs)  Cycle 1 day 15 on 11/28/2019  Cycle 1 Adriamycin/Cytoxan 12/12/2019  Cycle 2 Adriamycin/Cytoxan 01/02/2020 2. Left upper extremity DVT-left subclavian, axillary, and brachial vein thrombosis confirmed on  Anticoagulation with apixaban, changed to parenteral hospital admission1/14/2021transition to twice daily Lovenox at discharge; transition to once daily 11/08/2019.  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 Prophylactic ciprofloxacin initiated. [facility] admission 12/19/2019-acute respiratory failure with hypoxia, large left  pleural  effusion, ? HCAP, pancytopenia    Disposition: Abigail [name] has completed 2 cycles of Adriamycin/Cytoxan.  The left breast mass, chest wall nodules, and left arm edema are much improved.  She continues to have drainage from the Pleurx catheter.  I recommended she drain the catheter every other day.  She will return for an office visit and cycle 3 Adriamycin/Cytoxan on 01/23/2020.  She would like to convert to an oral anticoagulant.  She will discontinue Lovenox and begin Xarelto.  We discussed the bleeding risk associated with Xarelto.  Abigail Coder, MD  01/15/2020  3:21 PM

## 2020-01-17 NOTE — Progress Notes (Signed)
Pharmacist Chemotherapy Monitoring - Follow Up Assessment    I verify that I have reviewed each item in the below checklist:  . Regimen for the patient is scheduled for the appropriate day and plan matches scheduled date. Marland Kitchen Appropriate non-routine labs are ordered dependent on drug ordered. . If applicable, additional medications reviewed and ordered per protocol based on lifetime cumulative doses and/or treatment regimen.   Plan for follow-up and/or issues identified: No . I-vent associated with next due treatment: No . MD and/or nursing notified: No  Abigail [name] K 01/17/2020 11:17 AM

## 2020-01-20 ENCOUNTER — Other Ambulatory Visit: Payer: Self-pay | Admitting: Oncology

## 2020-01-23 ENCOUNTER — Other Ambulatory Visit: Payer: Self-pay | Admitting: Nurse Practitioner

## 2020-01-23 ENCOUNTER — Inpatient Hospital Stay: Payer: 59

## 2020-01-23 ENCOUNTER — Inpatient Hospital Stay (HOSPITAL_BASED_OUTPATIENT_CLINIC_OR_DEPARTMENT_OTHER): Payer: 59 | Admitting: Nurse Practitioner

## 2020-01-23 ENCOUNTER — Encounter: Payer: Self-pay | Admitting: Nurse Practitioner

## 2020-01-23 ENCOUNTER — Other Ambulatory Visit: Payer: Self-pay

## 2020-01-23 VITALS — BP 128/89 | HR 83 | Temp 98.0°F | Resp 18 | Wt 149.9 lb

## 2020-01-23 DIAGNOSIS — Z171 Estrogen receptor negative status [ER-]: Secondary | ICD-10-CM

## 2020-01-23 DIAGNOSIS — C50412 Malignant neoplasm of upper-outer quadrant of left female breast: Secondary | ICD-10-CM | POA: Diagnosis not present

## 2020-01-23 DIAGNOSIS — Z95828 Presence of other vascular implants and grafts: Secondary | ICD-10-CM

## 2020-01-23 DIAGNOSIS — N6320 Unspecified lump in the left breast, unspecified quadrant: Secondary | ICD-10-CM

## 2020-01-23 DIAGNOSIS — Z5111 Encounter for antineoplastic chemotherapy: Secondary | ICD-10-CM | POA: Diagnosis not present

## 2020-01-23 LAB — CBC WITH DIFFERENTIAL (CANCER CENTER ONLY)
Abs Immature Granulocytes: 1.1 10*3/uL — ABNORMAL HIGH (ref 0.00–0.07)
Band Neutrophils: 13 %
Basophils Absolute: 0 10*3/uL (ref 0.0–0.1)
Basophils Relative: 0 %
Eosinophils Absolute: 0.1 10*3/uL (ref 0.0–0.5)
Eosinophils Relative: 1 %
HCT: 27.2 % — ABNORMAL LOW (ref 36.0–46.0)
Hemoglobin: 8 g/dL — ABNORMAL LOW (ref 12.0–15.0)
Lymphocytes Relative: 13 %
Lymphs Abs: 1.3 10*3/uL (ref 0.7–4.0)
MCH: 27.6 pg (ref 26.0–34.0)
MCHC: 29.4 g/dL — ABNORMAL LOW (ref 30.0–36.0)
MCV: 93.8 fL (ref 80.0–100.0)
Metamyelocytes Relative: 2 %
Monocytes Absolute: 0.8 10*3/uL (ref 0.1–1.0)
Monocytes Relative: 8 %
Myelocytes: 9 %
Neutro Abs: 6.9 10*3/uL (ref 1.7–7.7)
Neutrophils Relative %: 54 %
Platelet Count: 123 10*3/uL — ABNORMAL LOW (ref 150–400)
RBC: 2.9 MIL/uL — ABNORMAL LOW (ref 3.87–5.11)
RDW: 18.9 % — ABNORMAL HIGH (ref 11.5–15.5)
WBC Count: 10.3 10*3/uL (ref 4.0–10.5)
nRBC: 29.9 % — ABNORMAL HIGH (ref 0.0–0.2)

## 2020-01-23 LAB — CMP (CANCER CENTER ONLY)
ALT: 17 U/L (ref 0–44)
AST: 22 U/L (ref 15–41)
Albumin: 3.4 g/dL — ABNORMAL LOW (ref 3.5–5.0)
Alkaline Phosphatase: 354 U/L — ABNORMAL HIGH (ref 38–126)
Anion gap: 8 (ref 5–15)
BUN: 8 mg/dL (ref 6–20)
CO2: 28 mmol/L (ref 22–32)
Calcium: 8.9 mg/dL (ref 8.9–10.3)
Chloride: 107 mmol/L (ref 98–111)
Creatinine: 0.67 mg/dL (ref 0.44–1.00)
GFR, Est AFR Am: >60 mL/min (ref >60)
GFR, Estimated: >60 mL/min (ref >60)
Glucose, Bld: 91 mg/dL (ref 70–99)
Potassium: 3.9 mmol/L (ref 3.5–5.1)
Sodium: 143 mmol/L (ref 135–145)
Total Bilirubin: 0.3 mg/dL (ref 0.3–1.2)
Total Protein: 6.6 g/dL (ref 6.5–8.1)

## 2020-01-23 MED ORDER — HEPARIN SOD (PORK) LOCK FLUSH 100 UNIT/ML IV SOLN
500.0000 [IU] | Freq: Once | INTRAVENOUS | Status: AC | PRN
  Administered 2020-01-23: 500 [IU]
  Filled 2020-01-23: qty 5

## 2020-01-23 MED ORDER — SODIUM CHLORIDE 0.9% FLUSH
10.0000 mL | INTRAVENOUS | Status: DC | PRN
  Administered 2020-01-23: 10 mL
  Filled 2020-01-23: qty 10

## 2020-01-23 MED ORDER — SODIUM CHLORIDE 0.9% FLUSH
10.0000 mL | Freq: Once | INTRAVENOUS | Status: AC
  Administered 2020-01-23: 10 mL
  Filled 2020-01-23: qty 10

## 2020-01-23 MED ORDER — HYDROCODONE-ACETAMINOPHEN 5-325 MG PO TABS: 1.0000 | ORAL_TABLET | Freq: Three times a day (TID) | ORAL | 0 refills | Status: DC | PRN

## 2020-01-23 MED ORDER — SODIUM CHLORIDE 0.9 % IV SOLN
10.0000 mg | Freq: Once | INTRAVENOUS | Status: AC
  Administered 2020-01-23: 10 mg via INTRAVENOUS
  Filled 2020-01-23: qty 10

## 2020-01-23 MED ORDER — HEPARIN SOD (PORK) LOCK FLUSH 100 UNIT/ML IV SOLN
500.0000 [IU] | Freq: Once | INTRAVENOUS | Status: DC
  Filled 2020-01-23: qty 5

## 2020-01-23 MED ORDER — SODIUM CHLORIDE 0.9 % IV SOLN
500.0000 mg/m2 | Freq: Once | INTRAVENOUS | Status: AC
  Administered 2020-01-23: 920 mg via INTRAVENOUS
  Filled 2020-01-23: qty 46

## 2020-01-23 MED ORDER — DOXORUBICIN HCL CHEMO IV INJECTION 2 MG/ML
50.0000 mg/m2 | Freq: Once | INTRAVENOUS | Status: AC
  Administered 2020-01-23: 92 mg via INTRAVENOUS
  Filled 2020-01-23: qty 46

## 2020-01-23 MED ORDER — PALONOSETRON HCL INJECTION 0.25 MG/5ML
INTRAVENOUS | Status: AC
  Filled 2020-01-23: qty 5

## 2020-01-23 MED ORDER — SODIUM CHLORIDE 0.9 % IV SOLN
150.0000 mg | Freq: Once | INTRAVENOUS | Status: AC
  Administered 2020-01-23: 150 mg via INTRAVENOUS
  Filled 2020-01-23: qty 150

## 2020-01-23 MED ORDER — SODIUM CHLORIDE 0.9 % IV SOLN
Freq: Once | INTRAVENOUS | Status: AC
  Filled 2020-01-23: qty 250

## 2020-01-23 MED ORDER — PALONOSETRON HCL INJECTION 0.25 MG/5ML
0.2500 mg | Freq: Once | INTRAVENOUS | Status: AC
  Administered 2020-01-23: 0.25 mg via INTRAVENOUS

## 2020-01-23 NOTE — Progress Notes (Signed)
[name]   Diagnosis: Breast cancer  INTERVAL HISTORY:   A[facility]ail [name] returns as scheduled.  She completed cycle 2 Adriamycin/Cytoxan 01/02/2020.  She had several days of nausea following the chemotherapy and has been prescribed dexamethasone 4 mg twice a day for 2 days beginning cycle 3-day 2.  No mouth sores.  No diarrhea.  Some constipation.  No shortness of breath.  She is draining the Pleurx catheter every other day, typically 75 to 100 mL each time.  She has a good appetite.  Pain continues to be improved.  No bleeding.  Objective:  Vital signs in last 24 hours:  Blood pressure 128/89, pulse 83, temperature 98 F (36.7 C), temperature source Temporal, resp. rate 18, weight 149 lb 14.4 oz (68 kg), SpO2 100 %.    HEENT: No thrush or ulcers. Resp: Diminished breath sounds left lower lung field.  Rub at the left upper to mid chest.  No respiratory distress. Cardio: Regular rate and rhythm. GI: Abdomen soft and nontender.  No hepatomegaly. Vascular: No leg edema.  Trace edema throughout the left arm. Skin: Decreased skin thickening and nodularity at the left upper back and left axilla. Breast: Left breast is softer, less edematous.  No discrete mass. Port-A-Cath without erythema.   Lab Results:  Lab Results  Component Value Date   WBC 10.3 01/23/2020   HGB 8.0 (L) 01/23/2020   HCT 27.2 (L) 01/23/2020   MCV 93.8 01/23/2020   PLT 123 (L) 01/23/2020   NEUTROABS PENDING 01/23/2020    Imaging:  No results found.  Medications: I have reviewed the patient's current medications.  Assessment/Plan: 1.Metastatic breast cancer  CT chest 10/25/2019-left breast mass left axillary lymph nodes, abnormal appearance of the left concerning for tumor involvement, mediastinal adenopathy, diffuse sclerotic and lytic lesions, left pleural effusion  CT abdomen/pelvis 10/26/2019 enhancing left breast mass with left axillary lymphadenopathy and tumor  involved the left chest wall the latissimus dorsi segment 4B liver lesion, diffuse lytic/sclerotic lesions  Ultrasound-guided biopsy of left liver lesion 10/29/1999-metastatic carcinoma, cytokeratin andGATA3positive consistent with metastatic breast cancer. HER-2 negative, ER negative, PR negative, Ki-6720%. PD-L1 <1% expression   Invitae hereditary panel negative  Initiated systemic chemotherapy weekly abraxane 100 mg/m2 days 1, 8, 15 q28 days   Cycle 1 day 1 on 11/14/19  Cycle 1 day 8 on 11/21/19 (hg 7.5, receive 1 unit RBCs)  Cycle 1 day 15 on 11/28/2019  Cycle 1 Adriamycin/Cytoxan 12/12/2019  Cycle 2 Adriamycin/Cytoxan 01/02/2020  Cycle 3 Adriamycin/Cytoxan 01/23/2020 2. Left upper extremity DVT-left subclavian, axillary, and brachial vein thrombosis confirmed on  Anticoagulation with apixaban, changed to parenteral hospital admission1/14/2021transition to twice daily Lovenox at discharge; transition to once daily 11/08/2019.  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 Prophylactic ciprofloxacin initiated. [facility] admission 12/19/2019-acute respiratory failure with hypoxia, large left pleural effusion, ? HCAP, pancytopenia   Disposition: Abigail [name] appears stable.  She has completed 2 cycles of Adriamycin/Cytoxan with significant clinical improvement.  Plan to proceed with cycle 3 today as scheduled.  She will take prophylactic  dexamethasone with this cycle.  We reviewed the CBC from today.  Counts adequate to proceed with treatment.  She  has mild thrombocytopenia.  She understands to contact the office with any bleeding.  Drainage from the Pleurx catheter is improving.  She will continue to drain every other day.  She continues Xarelto.  She will return for lab, follow-up, cycle 4 Adriamycin/Cytoxan in 3 weeks.  She will contact the office in the interim with any problems.  Ned Card ANP/GNP-BC   01/23/2020  12:51 PM

## 2020-01-23 NOTE — Patient Instructions (Signed)
[facility] Discharge Instructions for Patients Receiving Chemotherapy  Today you received the following chemotherapy agents: adriamycin/cytoxan  To help prevent nausea and vomiting after your treatment, we encourage you to take your nausea medication as directed.   If you develop nausea and vomiting that is not controlled by your nausea medication, call the clinic.   BELOW ARE SYMPTOMS THAT SHOULD BE REPORTED IMMEDIATELY:  *FEVER GREATER THAN 100.5 F  *CHILLS WITH OR WITHOUT FEVER  NAUSEA AND VOMITING THAT IS NOT CONTROLLED WITH YOUR NAUSEA MEDICATION  *UNUSUAL SHORTNESS OF BREATH  *UNUSUAL BRUISING OR BLEEDING  TENDERNESS IN MOUTH AND THROAT WITH OR WITHOUT PRESENCE OF ULCERS  *URINARY PROBLEMS  *BOWEL PROBLEMS  UNUSUAL RASH Items with * indicate a potential emergency and should be followed up as soon as possible.  Feel free to call the clinic should you have any questions or concerns. The clinic phone number is [phone].  Please show the [place] at check-in to the Emergency Department and triage nurse.

## 2020-01-25 ENCOUNTER — Other Ambulatory Visit: Payer: Self-pay

## 2020-01-25 ENCOUNTER — Telehealth: Payer: Self-pay

## 2020-01-25 ENCOUNTER — Inpatient Hospital Stay: Payer: 59

## 2020-01-25 VITALS — BP 128/72 | HR 78 | Temp 98.2°F | Resp 18

## 2020-01-25 DIAGNOSIS — Z5111 Encounter for antineoplastic chemotherapy: Secondary | ICD-10-CM | POA: Diagnosis not present

## 2020-01-25 DIAGNOSIS — C50412 Malignant neoplasm of upper-outer quadrant of left female breast: Secondary | ICD-10-CM

## 2020-01-25 DIAGNOSIS — Z171 Estrogen receptor negative status [ER-]: Secondary | ICD-10-CM

## 2020-01-25 MED ORDER — PEGFILGRASTIM-CBQV 6 MG/0.6ML ~~LOC~~ SOSY
PREFILLED_SYRINGE | SUBCUTANEOUS | Status: AC
  Filled 2020-01-25: qty 0.6

## 2020-01-25 MED ORDER — PEGFILGRASTIM-CBQV 6 MG/0.6ML ~~LOC~~ SOSY
6.0000 mg | PREFILLED_SYRINGE | Freq: Once | SUBCUTANEOUS | Status: AC
  Administered 2020-01-25: 10:00:00 6 mg via SUBCUTANEOUS

## 2020-01-25 NOTE — Telephone Encounter (Signed)
Called and left a message for her about her missed injection and advised her to call back to reschedule it for today as she needs it. Gardiner Rhyme

## 2020-01-25 NOTE — Patient Instructions (Signed)

## 2020-02-07 NOTE — Progress Notes (Signed)
Pharmacist Chemotherapy Monitoring - Follow Up Assessment    I verify that I have reviewed each item in the below checklist:  . Regimen for the patient is scheduled for the appropriate day and plan matches scheduled date. Marland Kitchen Appropriate non-routine labs are ordered dependent on drug ordered. . If applicable, additional medications reviewed and ordered per protocol based on lifetime cumulative doses and/or treatment regimen.   Plan for follow-up and/or issues identified: No . I-vent associated with next due treatment: No . MD and/or nursing notified: No  Abigail [name] D 02/07/2020 1:09 PM

## 2020-02-10 ENCOUNTER — Other Ambulatory Visit: Payer: Self-pay | Admitting: Oncology

## 2020-02-13 ENCOUNTER — Inpatient Hospital Stay: Payer: 59

## 2020-02-13 ENCOUNTER — Other Ambulatory Visit: Payer: Self-pay

## 2020-02-13 ENCOUNTER — Other Ambulatory Visit: Payer: Self-pay | Admitting: *Deleted

## 2020-02-13 ENCOUNTER — Inpatient Hospital Stay: Payer: 59 | Attending: Oncology | Admitting: Oncology

## 2020-02-13 VITALS — BP 119/84 | HR 88 | Temp 98.9°F | Resp 17 | Ht 61.0 in | Wt 145.8 lb

## 2020-02-13 DIAGNOSIS — Z171 Estrogen receptor negative status [ER-]: Secondary | ICD-10-CM

## 2020-02-13 DIAGNOSIS — C50412 Malignant neoplasm of upper-outer quadrant of left female breast: Secondary | ICD-10-CM

## 2020-02-13 DIAGNOSIS — Z5111 Encounter for antineoplastic chemotherapy: Secondary | ICD-10-CM | POA: Insufficient documentation

## 2020-02-13 DIAGNOSIS — Z95828 Presence of other vascular implants and grafts: Secondary | ICD-10-CM

## 2020-02-13 DIAGNOSIS — C50912 Malignant neoplasm of unspecified site of left female breast: Secondary | ICD-10-CM | POA: Diagnosis present

## 2020-02-13 DIAGNOSIS — D701 Agranulocytosis secondary to cancer chemotherapy: Secondary | ICD-10-CM | POA: Diagnosis not present

## 2020-02-13 LAB — CMP (CANCER CENTER ONLY)
ALT: 13 U/L (ref 0–44)
AST: 25 U/L (ref 15–41)
Albumin: 3.6 g/dL (ref 3.5–5.0)
Alkaline Phosphatase: 282 U/L — ABNORMAL HIGH (ref 38–126)
Anion gap: 8 (ref 5–15)
BUN: 8 mg/dL (ref 6–20)
CO2: 27 mmol/L (ref 22–32)
Calcium: 9 mg/dL (ref 8.9–10.3)
Chloride: 107 mmol/L (ref 98–111)
Creatinine: 0.61 mg/dL (ref 0.44–1.00)
GFR, Est AFR Am: >60 mL/min (ref >60)
GFR, Estimated: >60 mL/min (ref >60)
Glucose, Bld: 94 mg/dL (ref 70–99)
Potassium: 4 mmol/L (ref 3.5–5.1)
Sodium: 142 mmol/L (ref 135–145)
Total Bilirubin: 0.5 mg/dL (ref 0.3–1.2)
Total Protein: 6.7 g/dL (ref 6.5–8.1)

## 2020-02-13 LAB — CBC WITH DIFFERENTIAL (CANCER CENTER ONLY)
Abs Immature Granulocytes: 0.4 10*3/uL — ABNORMAL HIGH (ref 0.00–0.07)
Basophils Absolute: 0 10*3/uL (ref 0.0–0.1)
Basophils Relative: 1 %
Eosinophils Absolute: 0.1 10*3/uL (ref 0.0–0.5)
Eosinophils Relative: 2 %
HCT: 24.8 % — ABNORMAL LOW (ref 36.0–46.0)
Hemoglobin: 7.1 g/dL — ABNORMAL LOW (ref 12.0–15.0)
Immature Granulocytes: 5 %
Lymphocytes Relative: 16 %
Lymphs Abs: 1.4 10*3/uL (ref 0.7–4.0)
MCH: 26.9 pg (ref 26.0–34.0)
MCHC: 28.6 g/dL — ABNORMAL LOW (ref 30.0–36.0)
MCV: 93.9 fL (ref 80.0–100.0)
Monocytes Absolute: 1.6 10*3/uL — ABNORMAL HIGH (ref 0.1–1.0)
Monocytes Relative: 18 %
Neutro Abs: 5.1 10*3/uL (ref 1.7–7.7)
Neutrophils Relative %: 58 %
Platelet Count: 142 10*3/uL — ABNORMAL LOW (ref 150–400)
RBC: 2.64 MIL/uL — ABNORMAL LOW (ref 3.87–5.11)
RDW: 21.3 % — ABNORMAL HIGH (ref 11.5–15.5)
WBC Count: 8.7 10*3/uL (ref 4.0–10.5)
nRBC: 23.4 % — ABNORMAL HIGH (ref 0.0–0.2)

## 2020-02-13 MED ORDER — PALONOSETRON HCL INJECTION 0.25 MG/5ML
INTRAVENOUS | Status: AC
  Filled 2020-02-13: qty 5

## 2020-02-13 MED ORDER — RIVAROXABAN 20 MG PO TABS: 20.0000 mg | ORAL_TABLET | Freq: Every day | ORAL | 1 refills | Status: DC

## 2020-02-13 MED ORDER — SODIUM CHLORIDE 0.9% FLUSH
10.0000 mL | Freq: Once | INTRAVENOUS | Status: AC
  Administered 2020-02-13: 10 mL
  Filled 2020-02-13: qty 10

## 2020-02-13 MED ORDER — SODIUM CHLORIDE 0.9 % IV SOLN
500.0000 mg/m2 | Freq: Once | INTRAVENOUS | Status: AC
  Administered 2020-02-13: 15:00:00 840 mg via INTRAVENOUS
  Filled 2020-02-13: qty 42

## 2020-02-13 MED ORDER — SODIUM CHLORIDE 0.9 % IV SOLN
Freq: Once | INTRAVENOUS | Status: AC
  Filled 2020-02-13: qty 250

## 2020-02-13 MED ORDER — SODIUM CHLORIDE 0.9 % IV SOLN: 500.0000 mg/m2 | Freq: Once | INTRAVENOUS | Status: DC

## 2020-02-13 MED ORDER — PALONOSETRON HCL INJECTION 0.25 MG/5ML
0.2500 mg | Freq: Once | INTRAVENOUS | Status: AC
  Administered 2020-02-13: 0.25 mg via INTRAVENOUS

## 2020-02-13 MED ORDER — SODIUM CHLORIDE 0.9 % IV SOLN
10.0000 mg | Freq: Once | INTRAVENOUS | Status: AC
  Administered 2020-02-13: 10 mg via INTRAVENOUS
  Filled 2020-02-13: qty 10

## 2020-02-13 MED ORDER — SODIUM CHLORIDE 0.9 % IV SOLN
150.0000 mg | Freq: Once | INTRAVENOUS | Status: AC
  Administered 2020-02-13: 150 mg via INTRAVENOUS
  Filled 2020-02-13: qty 150

## 2020-02-13 MED ORDER — DOXORUBICIN HCL CHEMO IV INJECTION 2 MG/ML
50.0000 mg/m2 | Freq: Once | INTRAVENOUS | Status: AC
  Administered 2020-02-13: 84 mg via INTRAVENOUS
  Filled 2020-02-13: qty 42

## 2020-02-13 MED ORDER — DEXAMETHASONE 4 MG PO TABS: 8.0000 mg | ORAL_TABLET | Freq: Two times a day (BID) | ORAL | 0 refills | Status: DC

## 2020-02-13 MED ORDER — SODIUM CHLORIDE 0.9% FLUSH
10.0000 mL | INTRAVENOUS | Status: DC | PRN
  Administered 2020-02-13: 10 mL
  Filled 2020-02-13: qty 10

## 2020-02-13 MED ORDER — HEPARIN SOD (PORK) LOCK FLUSH 100 UNIT/ML IV SOLN
500.0000 [IU] | Freq: Once | INTRAVENOUS | Status: AC | PRN
  Administered 2020-02-13: 500 [IU]
  Filled 2020-02-13: qty 5

## 2020-02-13 MED ORDER — DOXORUBICIN HCL CHEMO IV INJECTION 2 MG/ML: 50.0000 mg/m2 | Freq: Once | INTRAVENOUS | Status: DC

## 2020-02-13 NOTE — Progress Notes (Signed)
Per Dr. Benay Spice, update chemotherapy treatment today to reflect new BSA of 1.69. Patient has lost weight since recent treatments due to fluid loss.   Demetrius Charity, PharmD, BCPS, [facility] Pharmacist Pharmacy Phone: [phone] 02/13/2020

## 2020-02-13 NOTE — Addendum Note (Signed)
Addended by: Tania Ade on: 02/13/2020 01:29 PM   Modules accepted: Orders

## 2020-02-13 NOTE — Progress Notes (Signed)
Per Dr. Benay Spice: OK to treat w/Hgb 7.1--patient not symptomatic and declines transfusion

## 2020-02-13 NOTE — Patient Instructions (Signed)
[facility] Discharge Instructions for Patients Receiving Chemotherapy  Today you received the following chemotherapy agents Doxorubicin (ADRIAMYCIN) & Cyclophosphamide (CYTOXAN).  To help prevent nausea and vomiting after your treatment, we encourage you to take your nausea medication as prescribed.   If you develop nausea and vomiting that is not controlled by your nausea medication, call the clinic.   BELOW ARE SYMPTOMS THAT SHOULD BE REPORTED IMMEDIATELY:  *FEVER GREATER THAN 100.5 F  *CHILLS WITH OR WITHOUT FEVER  NAUSEA AND VOMITING THAT IS NOT CONTROLLED WITH YOUR NAUSEA MEDICATION  *UNUSUAL SHORTNESS OF BREATH  *UNUSUAL BRUISING OR BLEEDING  TENDERNESS IN MOUTH AND THROAT WITH OR WITHOUT PRESENCE OF ULCERS  *URINARY PROBLEMS  *BOWEL PROBLEMS  UNUSUAL RASH Items with * indicate a potential emergency and should be followed up as soon as possible.  Feel free to call the clinic should you have any questions or concerns. The clinic phone number is [phone].  Please show the CHEMO ALERT CARD at check-in to the Emergency Department and triage nurse.   

## 2020-02-13 NOTE — Progress Notes (Signed)
[facility] OFFICE PROGRESS NOTE   Diagnosis: Breast cancer  INTERVAL HISTORY:   Ms. Poche completed another cycle of Adriamycin/Cytoxan on 01/23/2020.  She reports nausea beginning on the evening of chemotherapy.  Decadron did not help when she took this on day 2.  Zofran helped nausea.  The nausea lasted for several days.  She reports decreased edema in the left arm.  She continues to drain the Pleurx catheter every other day.  The drainage is now measured between 50 and 75 cc.  Back pain has resolved.  Good appetite.  No bone pain when she receives G-CSF.  Objective:  Vital signs in last 24 hours:  Blood pressure 119/84, pulse 88, temperature 98.9 F (37.2 C), temperature source Temporal, resp. rate 17, height '5\' 1"'  (1.549 m), weight 145 lb 12.8 oz (66.1 kg), SpO2 100 %.    HEENT: Mild white coat over the tongue, no buccal thrush or ulcers Lymphatics: No left axillary nodes Resp: Lungs clear bilaterally Cardio: Regular rate and rhythm GI: No hepatomegaly, nontender Vascular: No leg edema, mild edema of the left arm Skin: Dry hyperpigmented rash over the left arm, skin thickening at the left upper back and upper anterior chest has improved, barely perceptible skin nodules at the upper lateral chest wall Breast: Mild edema of the left breast, the breast is soft and without a discrete palpable mass  Portacath/PICC-without erythema  Lab Results:  Lab Results  Component Value Date   WBC 8.7 02/13/2020   HGB 7.1 (L) 02/13/2020   HCT 24.8 (L) 02/13/2020   MCV 93.9 02/13/2020   PLT 142 (L) 02/13/2020   NEUTROABS 5.1 02/13/2020    CMP  Lab Results  Component Value Date   NA 142 02/13/2020   K 4.0 02/13/2020   CL 107 02/13/2020   CO2 27 02/13/2020   GLUCOSE 94 02/13/2020   BUN 8 02/13/2020   CREATININE 0.61 02/13/2020   CALCIUM 9.0 02/13/2020   PROT 6.7 02/13/2020   ALBUMIN 3.6 02/13/2020   AST 25 02/13/2020   ALT 13 02/13/2020   ALKPHOS 282 (H)  02/13/2020   BILITOT 0.5 02/13/2020   GFRNONAA >60 02/13/2020   GFRAA >60 02/13/2020     Medications: I have reviewed the patient's current medications.   Assessment/Plan: 1.Metastatic breast cancer  CT chest 10/25/2019-left breast mass left axillary lymph nodes, abnormal appearance of the left concerning for tumor involvement, mediastinal adenopathy, diffuse sclerotic and lytic lesions, left pleural effusion  CT abdomen/pelvis 10/26/2019 enhancing left breast mass with left axillary lymphadenopathy and tumor involved the left chest wall the latissimus dorsi segment 4B liver lesion, diffuse lytic/sclerotic lesions  Ultrasound-guided biopsy of left liver lesion 10/29/1999-metastatic carcinoma, cytokeratin andGATA3positive consistent with metastatic breast cancer. HER-2 negative, ER negative, PR negative, Ki-6720%. PD-L1 <1% expression   Invitae hereditary panel negative  Initiated systemic chemotherapy weekly abraxane 100 mg/m2 days 1, 8, 15 q28 days   Cycle 1 day 1 on 11/14/19  Cycle 1 day 8 on 11/21/19 (hg 7.5, receive 1 unit RBCs)  Cycle 1 day 15 on 11/28/2019  Cycle 1 Adriamycin/Cytoxan 12/12/2019  Cycle 2 Adriamycin/Cytoxan 01/02/2020  Cycle 3 Adriamycin/Cytoxan 01/23/2020  Cycle 4 Adriamycin/Cytoxan 02/13/2020 2. Left upper extremity DVT-left subclavian, axillary, and brachial vein thrombosis confirmed on  Anticoagulation with apixaban, changed to parenteral hospital admission1/14/2021transition to twice daily Lovenox at discharge; transition to once daily 11/08/2019.  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 Prophylactic ciprofloxacin initiated. [facility] admission 12/19/2019-acute respiratory failure with hypoxia, large left pleural effusion, ? HCAP, pancytopenia   Disposition: Abigail [name] appears well.  She has completed 3 cycles of Adriamycin/Cytoxan.  There has been significant clinical improvement.  The left breast mass, skin thickening, and chest wall nodules are markedly improved.  The pleural catheter drainage has diminished.  We discussed removing the pleural catheter.  She would like to keep this in for now.  She has severe anemia secondary to chemotherapy and metastatic breast cancer involving the bones.  She does not wish to have a transfusion at present.  She will return for a CBC and transfusion as needed next week.  Abigail [name] will complete another cycle of Adriamycin/Cytoxan today.  We increased the dose of Decadron for treatment of delayed nausea.  We discussed the prognosis and long-term treatment plan.  She understands no therapy will be curative.  The plan is to continue Adriamycin/Cytoxan to maximal response and tolerance.  We will then consider a treatment break and maintenance treatment options.  Betsy Coder, MD  02/13/2020  12:30 PM

## 2020-02-15 ENCOUNTER — Telehealth: Payer: Self-pay | Admitting: Oncology

## 2020-02-15 ENCOUNTER — Other Ambulatory Visit: Payer: Self-pay

## 2020-02-15 ENCOUNTER — Inpatient Hospital Stay: Payer: 59

## 2020-02-15 VITALS — BP 118/72 | HR 78 | Temp 98.2°F | Resp 18

## 2020-02-15 DIAGNOSIS — Z171 Estrogen receptor negative status [ER-]: Secondary | ICD-10-CM

## 2020-02-15 DIAGNOSIS — Z5111 Encounter for antineoplastic chemotherapy: Secondary | ICD-10-CM | POA: Diagnosis not present

## 2020-02-15 MED ORDER — CYANOCOBALAMIN 1000 MCG/ML IJ SOLN
INTRAMUSCULAR | Status: AC
  Filled 2020-02-15: qty 1

## 2020-02-15 MED ORDER — PEGFILGRASTIM-CBQV 6 MG/0.6ML ~~LOC~~ SOSY
6.0000 mg | PREFILLED_SYRINGE | Freq: Once | SUBCUTANEOUS | Status: AC
  Administered 2020-02-15: 6 mg via SUBCUTANEOUS

## 2020-02-15 MED ORDER — PEGFILGRASTIM-CBQV 6 MG/0.6ML ~~LOC~~ SOSY
PREFILLED_SYRINGE | SUBCUTANEOUS | Status: AC
  Filled 2020-02-15: qty 0.6

## 2020-02-15 NOTE — Patient Instructions (Signed)

## 2020-02-15 NOTE — Telephone Encounter (Signed)
Scheduled per los. Called and spoke with patient. Confirmed appt 

## 2020-02-18 ENCOUNTER — Encounter: Payer: Self-pay | Admitting: *Deleted

## 2020-02-18 NOTE — Progress Notes (Signed)
Faxed MD note to extend her disability from work until 06/10/20 with office notes, chemo flowsheets to fax [phone]. Claim [id number]

## 2020-02-20 ENCOUNTER — Inpatient Hospital Stay: Payer: 59

## 2020-02-20 ENCOUNTER — Other Ambulatory Visit: Payer: Self-pay

## 2020-02-20 ENCOUNTER — Telehealth: Payer: Self-pay | Admitting: *Deleted

## 2020-02-20 DIAGNOSIS — C50412 Malignant neoplasm of upper-outer quadrant of left female breast: Secondary | ICD-10-CM

## 2020-02-20 DIAGNOSIS — Z5111 Encounter for antineoplastic chemotherapy: Secondary | ICD-10-CM | POA: Diagnosis not present

## 2020-02-20 LAB — CBC WITH DIFFERENTIAL (CANCER CENTER ONLY)
Abs Immature Granulocytes: 0.03 10*3/uL (ref 0.00–0.07)
Basophils Absolute: 0 10*3/uL (ref 0.0–0.1)
Basophils Relative: 1 %
Eosinophils Absolute: 0.1 10*3/uL (ref 0.0–0.5)
Eosinophils Relative: 1 %
HCT: 25 % — ABNORMAL LOW (ref 36.0–46.0)
Hemoglobin: 7.2 g/dL — ABNORMAL LOW (ref 12.0–15.0)
Immature Granulocytes: 1 %
Lymphocytes Relative: 12 %
Lymphs Abs: 0.5 10*3/uL — ABNORMAL LOW (ref 0.7–4.0)
MCH: 27.2 pg (ref 26.0–34.0)
MCHC: 28.8 g/dL — ABNORMAL LOW (ref 30.0–36.0)
MCV: 94.3 fL (ref 80.0–100.0)
Monocytes Absolute: 0.1 10*3/uL (ref 0.1–1.0)
Monocytes Relative: 3 %
Neutro Abs: 3.3 10*3/uL (ref 1.7–7.7)
Neutrophils Relative %: 82 %
Platelet Count: 126 10*3/uL — ABNORMAL LOW (ref 150–400)
RBC: 2.65 MIL/uL — ABNORMAL LOW (ref 3.87–5.11)
RDW: 19.5 % — ABNORMAL HIGH (ref 11.5–15.5)
WBC Count: 4 10*3/uL (ref 4.0–10.5)
nRBC: 0 % (ref 0.0–0.2)

## 2020-02-20 LAB — SAMPLE TO BLOOD BANK

## 2020-02-20 NOTE — Telephone Encounter (Signed)
-----   Message from Ladell Pier, MD sent at 02/20/2020 10:53 AM EDT ----- Please call patient, hemoglobin is stable, schedule red cell transfusion if she has symptomatic anemia

## 2020-02-20 NOTE — Telephone Encounter (Signed)
Notified of Hgb 7.2. She is feeling well with no dyspnea. Wants to hold off on transfusion. She will f/u as scheduled and call if condition changes.

## 2020-02-28 NOTE — Progress Notes (Signed)
Pharmacist Chemotherapy Monitoring - Follow Up Assessment    I verify that I have reviewed each item in the below checklist:  . Regimen for the patient is scheduled for the appropriate day and plan matches scheduled date. Marland Kitchen Appropriate non-routine labs are ordered dependent on drug ordered. . If applicable, additional medications reviewed and ordered per protocol based on lifetime cumulative doses and/or treatment regimen.   Plan for follow-up and/or issues identified: No . I-vent associated with next due treatment: No . MD and/or nursing notified: No  Poseidon Pam D 02/28/2020 3:53 PM

## 2020-03-02 ENCOUNTER — Other Ambulatory Visit: Payer: Self-pay | Admitting: Oncology

## 2020-03-05 ENCOUNTER — Inpatient Hospital Stay: Payer: 59

## 2020-03-05 ENCOUNTER — Other Ambulatory Visit: Payer: Self-pay

## 2020-03-05 ENCOUNTER — Inpatient Hospital Stay: Payer: 59 | Admitting: Oncology

## 2020-03-05 VITALS — BP 124/75 | HR 87 | Temp 98.2°F | Resp 18 | Wt 145.5 lb

## 2020-03-05 DIAGNOSIS — C50912 Malignant neoplasm of unspecified site of left female breast: Secondary | ICD-10-CM | POA: Diagnosis not present

## 2020-03-05 DIAGNOSIS — D649 Anemia, unspecified: Secondary | ICD-10-CM

## 2020-03-05 DIAGNOSIS — Z171 Estrogen receptor negative status [ER-]: Secondary | ICD-10-CM | POA: Diagnosis not present

## 2020-03-05 DIAGNOSIS — J90 Pleural effusion, not elsewhere classified: Secondary | ICD-10-CM | POA: Diagnosis not present

## 2020-03-05 DIAGNOSIS — I82622 Acute embolism and thrombosis of deep veins of left upper extremity: Secondary | ICD-10-CM

## 2020-03-05 DIAGNOSIS — Z95828 Presence of other vascular implants and grafts: Secondary | ICD-10-CM

## 2020-03-05 DIAGNOSIS — C50412 Malignant neoplasm of upper-outer quadrant of left female breast: Secondary | ICD-10-CM

## 2020-03-05 DIAGNOSIS — Z5111 Encounter for antineoplastic chemotherapy: Secondary | ICD-10-CM | POA: Diagnosis not present

## 2020-03-05 LAB — CBC WITH DIFFERENTIAL (CANCER CENTER ONLY)
Abs Immature Granulocytes: 0.08 10*3/uL — ABNORMAL HIGH (ref 0.00–0.07)
Basophils Absolute: 0.1 10*3/uL (ref 0.0–0.1)
Basophils Relative: 1 %
Eosinophils Absolute: 0.1 10*3/uL (ref 0.0–0.5)
Eosinophils Relative: 2 %
HCT: 25.7 % — ABNORMAL LOW (ref 36.0–46.0)
Hemoglobin: 7.2 g/dL — ABNORMAL LOW (ref 12.0–15.0)
Immature Granulocytes: 1 %
Lymphocytes Relative: 13 %
Lymphs Abs: 0.8 10*3/uL (ref 0.7–4.0)
MCH: 27.4 pg (ref 26.0–34.0)
MCHC: 28 g/dL — ABNORMAL LOW (ref 30.0–36.0)
MCV: 97.7 fL (ref 80.0–100.0)
Monocytes Absolute: 1.1 10*3/uL — ABNORMAL HIGH (ref 0.1–1.0)
Monocytes Relative: 18 %
Neutro Abs: 3.9 10*3/uL (ref 1.7–7.7)
Neutrophils Relative %: 65 %
Platelet Count: 220 10*3/uL (ref 150–400)
RBC: 2.63 MIL/uL — ABNORMAL LOW (ref 3.87–5.11)
RDW: 23.6 % — ABNORMAL HIGH (ref 11.5–15.5)
WBC Count: 6 10*3/uL (ref 4.0–10.5)
nRBC: 10.1 % — ABNORMAL HIGH (ref 0.0–0.2)

## 2020-03-05 LAB — CMP (CANCER CENTER ONLY)
ALT: 15 U/L (ref 0–44)
AST: 22 U/L (ref 15–41)
Albumin: 3.7 g/dL (ref 3.5–5.0)
Alkaline Phosphatase: 240 U/L — ABNORMAL HIGH (ref 38–126)
Anion gap: 9 (ref 5–15)
BUN: 8 mg/dL (ref 6–20)
CO2: 28 mmol/L (ref 22–32)
Calcium: 9.1 mg/dL (ref 8.9–10.3)
Chloride: 108 mmol/L (ref 98–111)
Creatinine: 0.69 mg/dL (ref 0.44–1.00)
GFR, Est AFR Am: >60 mL/min (ref >60)
GFR, Estimated: >60 mL/min (ref >60)
Glucose, Bld: 105 mg/dL — ABNORMAL HIGH (ref 70–99)
Potassium: 3.6 mmol/L (ref 3.5–5.1)
Sodium: 145 mmol/L (ref 135–145)
Total Bilirubin: 0.4 mg/dL (ref 0.3–1.2)
Total Protein: 6.7 g/dL (ref 6.5–8.1)

## 2020-03-05 LAB — SAMPLE TO BLOOD BANK

## 2020-03-05 MED ORDER — SODIUM CHLORIDE 0.9 % IV SOLN
150.0000 mg | Freq: Once | INTRAVENOUS | Status: AC
  Administered 2020-03-05: 150 mg via INTRAVENOUS
  Filled 2020-03-05: qty 150

## 2020-03-05 MED ORDER — PALONOSETRON HCL INJECTION 0.25 MG/5ML
INTRAVENOUS | Status: AC
  Filled 2020-03-05: qty 5

## 2020-03-05 MED ORDER — DOXORUBICIN HCL CHEMO IV INJECTION 2 MG/ML
50.0000 mg/m2 | Freq: Once | INTRAVENOUS | Status: AC
  Administered 2020-03-05: 84 mg via INTRAVENOUS
  Filled 2020-03-05: qty 42

## 2020-03-05 MED ORDER — SODIUM CHLORIDE 0.9 % IV SOLN
500.0000 mg/m2 | Freq: Once | INTRAVENOUS | Status: AC
  Administered 2020-03-05: 840 mg via INTRAVENOUS
  Filled 2020-03-05: qty 42

## 2020-03-05 MED ORDER — SODIUM CHLORIDE 0.9 % IV SOLN
10.0000 mg | Freq: Once | INTRAVENOUS | Status: AC
  Administered 2020-03-05: 10 mg via INTRAVENOUS
  Filled 2020-03-05: qty 10

## 2020-03-05 MED ORDER — SODIUM CHLORIDE 0.9 % IV SOLN
Freq: Once | INTRAVENOUS | Status: AC
  Filled 2020-03-05: qty 250

## 2020-03-05 MED ORDER — SODIUM CHLORIDE 0.9% FLUSH
10.0000 mL | Freq: Once | INTRAVENOUS | Status: AC
  Administered 2020-03-05: 10 mL
  Filled 2020-03-05: qty 10

## 2020-03-05 MED ORDER — SODIUM CHLORIDE 0.9% FLUSH
10.0000 mL | INTRAVENOUS | Status: DC | PRN
  Administered 2020-03-05: 10 mL
  Filled 2020-03-05: qty 10

## 2020-03-05 MED ORDER — HEPARIN SOD (PORK) LOCK FLUSH 100 UNIT/ML IV SOLN
500.0000 [IU] | Freq: Once | INTRAVENOUS | Status: AC | PRN
  Administered 2020-03-05: 500 [IU]
  Filled 2020-03-05: qty 5

## 2020-03-05 MED ORDER — PALONOSETRON HCL INJECTION 0.25 MG/5ML
0.2500 mg | Freq: Once | INTRAVENOUS | Status: AC
  Administered 2020-03-05: 0.25 mg via INTRAVENOUS

## 2020-03-05 NOTE — Progress Notes (Signed)
Per Dr. Benay Spice: OK to treat w/Hgb 7.2

## 2020-03-05 NOTE — Progress Notes (Signed)
[facility] OFFICE PROGRESS NOTE   Diagnosis: Breast cancer   INTERVAL HISTORY:   Abigail [name] completed another cycle of Adriamycin/Cytoxan on 02/13/2020.  No nausea/vomiting, mouth sores, or dyspnea.  She feels well.  Left arm edema has resolved.  She continues anticoagulation therapy.  The Pleurx catheter is draining approximately 5 cc every other day.  Objective:  Vital signs in last 24 hours:  Blood pressure 124/75, pulse 87, temperature 98.2 F (36.8 C), temperature source Oral, resp. rate 18, weight 145 lb 8 oz (66 kg), SpO2 100 %.    HEENT: No thrush or ulcers Resp: Lungs clear bilaterally, left chest Pleurx catheter with a gauze dressing Cardio: Regular rate and rhythm, no gallop GI: No hepatomegaly, nontender Vascular: No leg edema  Skin: Barely perceptible nodularity at the left pectoral line, no nodularity at the upper back  Portacath/PICC-without erythema  Lab Results:  Lab Results  Component Value Date   WBC 6.0 03/05/2020   HGB 7.2 (L) 03/05/2020   HCT 25.7 (L) 03/05/2020   MCV 97.7 03/05/2020   PLT 220 03/05/2020   NEUTROABS 3.9 03/05/2020    CMP  Lab Results  Component Value Date   NA 145 03/05/2020   K 3.6 03/05/2020   CL 108 03/05/2020   CO2 28 03/05/2020   GLUCOSE 105 (H) 03/05/2020   BUN 8 03/05/2020   CREATININE 0.69 03/05/2020   CALCIUM 9.1 03/05/2020   PROT 6.7 03/05/2020   ALBUMIN 3.7 03/05/2020   AST 22 03/05/2020   ALT 15 03/05/2020   ALKPHOS 240 (H) 03/05/2020   BILITOT 0.4 03/05/2020   GFRNONAA >60 03/05/2020   GFRAA >60 03/05/2020    No results found for: CEA1  Lab Results  Component Value Date   INR 1.1 10/29/2019    Imaging:  No results found.  Medications: I have reviewed the patient's current medications.   Assessment/Plan: .Metastatic breast cancer  CT chest 10/25/2019-left breast mass left axillary lymph nodes, abnormal appearance of the left concerning for tumor involvement, mediastinal  adenopathy, diffuse sclerotic and lytic lesions, left pleural effusion  CT abdomen/pelvis 10/26/2019 enhancing left breast mass with left axillary lymphadenopathy and tumor involved the left chest wall the latissimus dorsi segment 4B liver lesion, diffuse lytic/sclerotic lesions  Ultrasound-guided biopsy of left liver lesion 10/29/1999-metastatic carcinoma, cytokeratin andGATA3positive consistent with metastatic breast cancer. HER-2 negative, ER negative, PR negative, Ki-6720%. PD-L1 <1% expression   Invitae hereditary panel negative  Initiated systemic chemotherapy weekly abraxane 100 mg/m2 days 1, 8, 15 q28 days   Cycle 1 day 1 on 11/14/19  Cycle 1 day 8 on 11/21/19 (hg 7.5, receive 1 unit RBCs)  Cycle 1 day 15 on 11/28/2019  Cycle 1 Adriamycin/Cytoxan 12/12/2019  Cycle 2 Adriamycin/Cytoxan 01/02/2020  Cycle 3 Adriamycin/Cytoxan 01/23/2020  Cycle 4 Adriamycin/Cytoxan 02/13/2020  Cycle 5 Adriamycin/Cytoxan 03/05/2020 2. Left upper extremity DVT-left subclavian, axillary, and brachial vein thrombosis confirmed on  Anticoagulation with apixaban, changed to parenteral hospital admission1/14/2021transition to twice daily Lovenox at discharge; transition to once daily 11/08/2019.  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 Prophylactic ciprofloxacin initiated. [facility] admission 12/19/2019-acute respiratory failure with hypoxia, large left pleural  effusion, ? HCAP, pancytopenia  Disposition: Abigail [name] has completed 4 cycles of Adriamycin/Cytoxan.  She continues to tolerate the treatment well.  There has been marked clinical improvement.  She will complete cycle 5 today.  She will undergo a restaging CT after this cycle.  The Pleurx catheter has minimal drainage.  I recommend a chest x-ray and catheter removal.  She wants to keep the catheter in place for now.  She will agree to catheter removal after the restaging CT.  She does not appear symptomatic from anemia.  She will contact us for symptoms of anemia and we will arrange for transfusion support.  Abigail [name] will return for an office visit and chemotherapy in 3 weeks.  Abigail Coder, MD  03/05/2020  9:58 AM

## 2020-03-05 NOTE — Patient Instructions (Signed)
[facility] Discharge Instructions for Patients Receiving Chemotherapy  Today you received the following chemotherapy agents Doxorubicin (ADRIAMYCIN) & Cyclophosphamide (CYTOXAN).  To help prevent nausea and vomiting after your treatment, we encourage you to take your nausea medication as prescribed.   If you develop nausea and vomiting that is not controlled by your nausea medication, call the clinic.   BELOW ARE SYMPTOMS THAT SHOULD BE REPORTED IMMEDIATELY:  *FEVER GREATER THAN 100.5 F  *CHILLS WITH OR WITHOUT FEVER  NAUSEA AND VOMITING THAT IS NOT CONTROLLED WITH YOUR NAUSEA MEDICATION  *UNUSUAL SHORTNESS OF BREATH  *UNUSUAL BRUISING OR BLEEDING  TENDERNESS IN MOUTH AND THROAT WITH OR WITHOUT PRESENCE OF ULCERS  *URINARY PROBLEMS  *BOWEL PROBLEMS  UNUSUAL RASH Items with * indicate a potential emergency and should be followed up as soon as possible.  Feel free to call the clinic should you have any questions or concerns. The clinic phone number is [phone].  Please show the CHEMO ALERT CARD at check-in to the Emergency Department and triage nurse.   

## 2020-03-07 ENCOUNTER — Inpatient Hospital Stay: Payer: 59

## 2020-03-07 ENCOUNTER — Telehealth: Payer: Self-pay | Admitting: Oncology

## 2020-03-07 ENCOUNTER — Other Ambulatory Visit: Payer: Self-pay

## 2020-03-07 VITALS — BP 113/76 | HR 76 | Resp 18

## 2020-03-07 DIAGNOSIS — C50412 Malignant neoplasm of upper-outer quadrant of left female breast: Secondary | ICD-10-CM

## 2020-03-07 DIAGNOSIS — Z5111 Encounter for antineoplastic chemotherapy: Secondary | ICD-10-CM | POA: Diagnosis not present

## 2020-03-07 MED ORDER — PEGFILGRASTIM-CBQV 6 MG/0.6ML ~~LOC~~ SOSY
PREFILLED_SYRINGE | SUBCUTANEOUS | Status: AC
  Filled 2020-03-07: qty 0.6

## 2020-03-07 MED ORDER — PEGFILGRASTIM-CBQV 6 MG/0.6ML ~~LOC~~ SOSY
6.0000 mg | PREFILLED_SYRINGE | Freq: Once | SUBCUTANEOUS | Status: AC
  Administered 2020-03-07: 6 mg via SUBCUTANEOUS

## 2020-03-07 NOTE — Patient Instructions (Signed)

## 2020-03-07 NOTE — Telephone Encounter (Signed)
Scheduled per los. Called and left msg. Mailed printout  °

## 2020-03-17 ENCOUNTER — Telehealth: Payer: Self-pay | Admitting: *Deleted

## 2020-03-17 NOTE — Telephone Encounter (Signed)
Reports the extension on her STD was faxed in with return to work date as 06/11/2019 and this needs to be corrected to 06/10/2020 and faxed back to [phone]. She is also requesting a call when this has been done so she can F/U with HR. Forwarded message to [place], Therapist, sports.

## 2020-03-18 NOTE — Telephone Encounter (Signed)
Faxed corrected return to work date of 06/10/2020 to her HR department as requested and patient notified.

## 2020-03-18 NOTE — Telephone Encounter (Signed)
Connected with [name], "confirmed receipt of faxed information.  Health Care Provider Statement received by this nurse doees not need to be completed by provider.  Request clarification of 06/10/2020 date as return to work date or day extension ends."  Abigail [name] post chemotherapy treatment recovery extension ends 06/10/2020 makes 06/11/2020 the first available date approved to work.

## 2020-03-23 ENCOUNTER — Other Ambulatory Visit: Payer: Self-pay | Admitting: Oncology

## 2020-03-24 ENCOUNTER — Ambulatory Visit (HOSPITAL_COMMUNITY)
Admission: RE | Admit: 2020-03-24 | Discharge: 2020-03-24 | Disposition: A | Discharge status: Home / Self Care | Payer: 59 | Source: Ambulatory Visit | Attending: Oncology | Admitting: Oncology

## 2020-03-24 ENCOUNTER — Other Ambulatory Visit: Payer: Self-pay

## 2020-03-24 DIAGNOSIS — Z171 Estrogen receptor negative status [ER-]: Secondary | ICD-10-CM | POA: Diagnosis present

## 2020-03-24 DIAGNOSIS — C50412 Malignant neoplasm of upper-outer quadrant of left female breast: Secondary | ICD-10-CM | POA: Diagnosis not present

## 2020-03-24 IMAGING — CT CT CHEST W/ CM
2 of 3 series · 15 of 36 positions shown, 18 images · IV contrast (OMNIPAQUE)
Comparison: [DATE] and [DATE].

CLINICAL DATA: Restaging left breast cancer. Chemotherapy in
progress.

EXAM:
CT CHEST WITH CONTRAST
TECHNIQUE: Multidetector CT imaging of the chest was performed during
intravenous contrast administration.
CONTRAST:  75mL OMNIPAQUE IOHEXOL 300 MG/ML  SOLN

[Series 3: axial st · axial · 0.63mm/px · z∈[-480,-274]mm · 12 of 123 slices shown, 15 images]
[im 10/123  mediastinal]
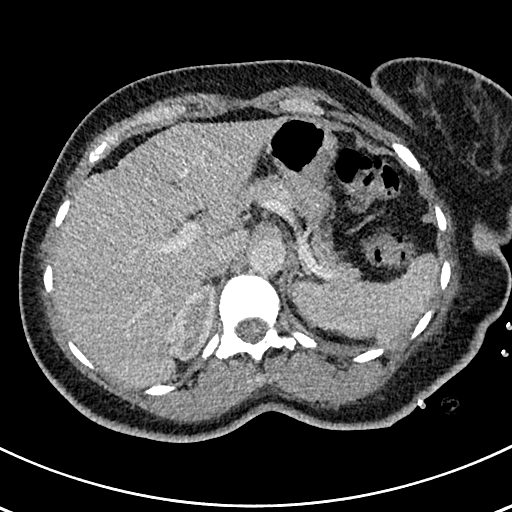
[im 10/123  lung]
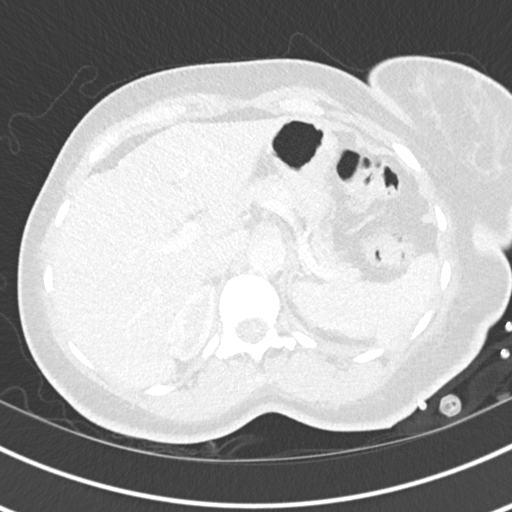
[im 19/123  lung]
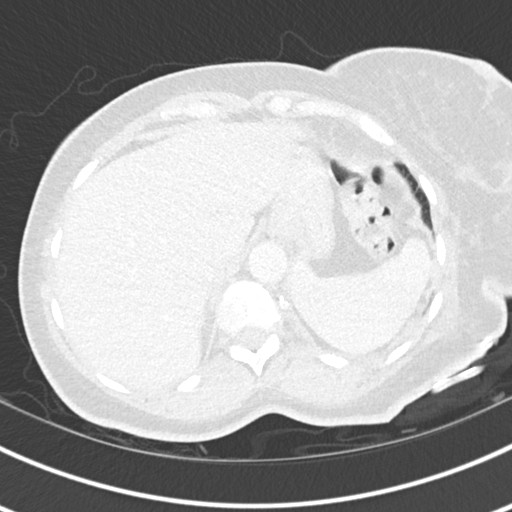
[im 28/123  lung]
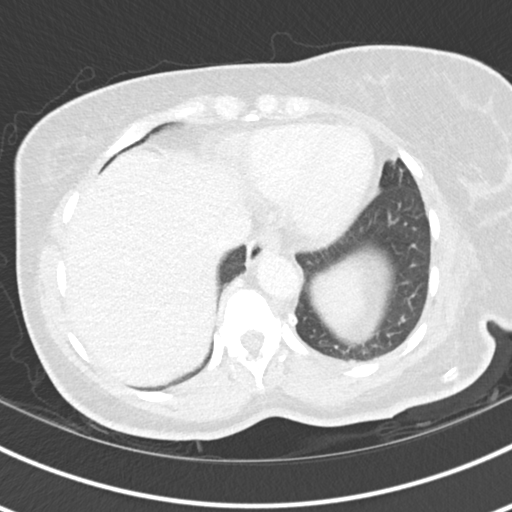
[im 37/123  lung]
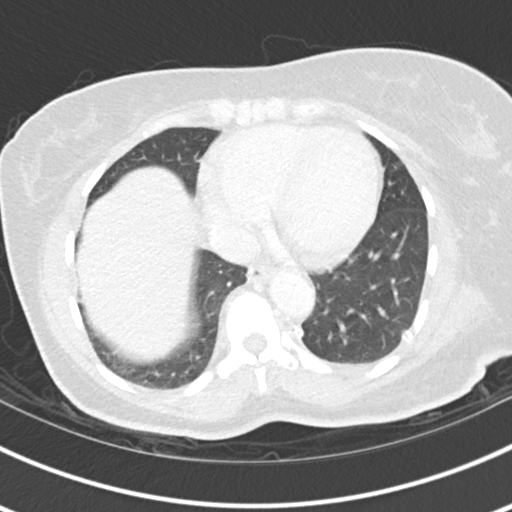
[im 46/123  mediastinal]
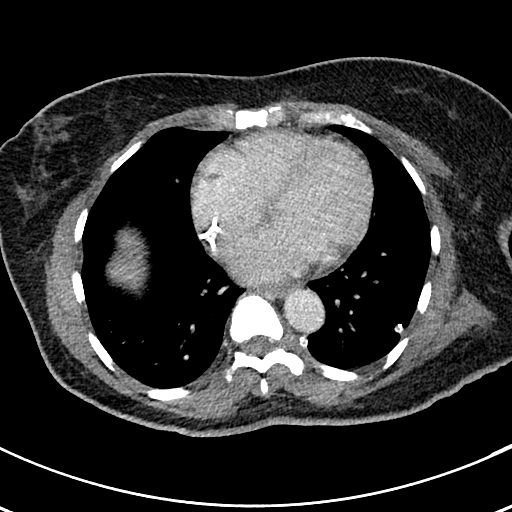
[im 46/123  lung]
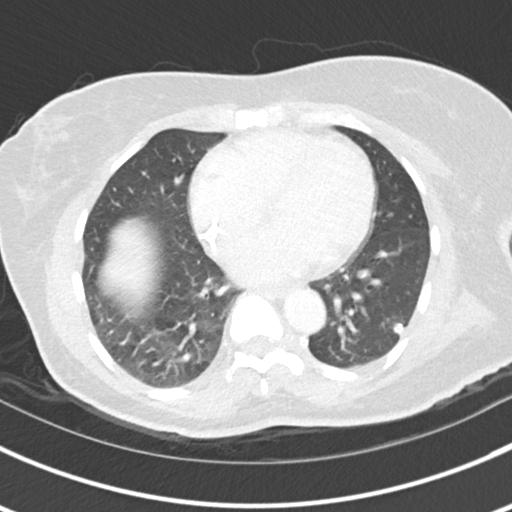
[im 55/123  lung]
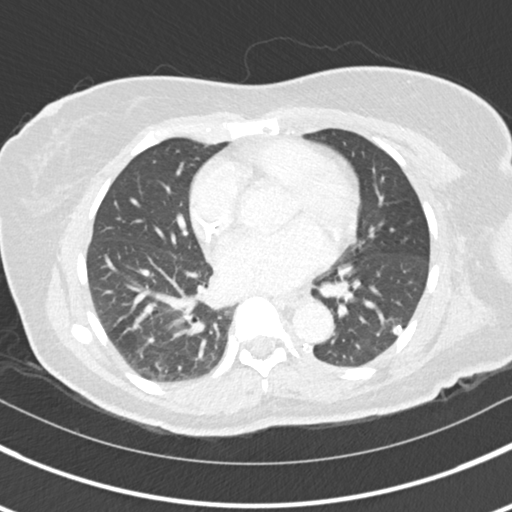
[im 68/123  lung]
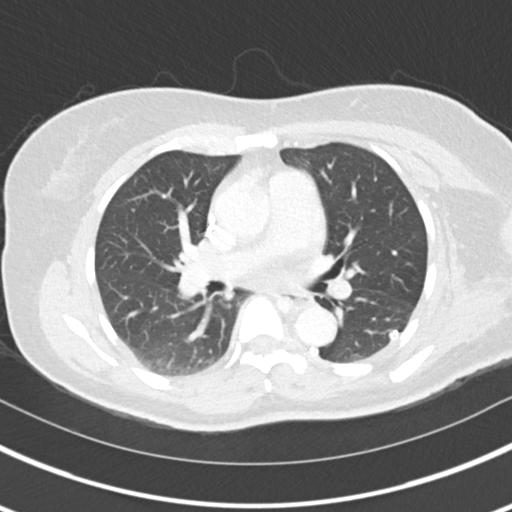
[im 77/123  lung]
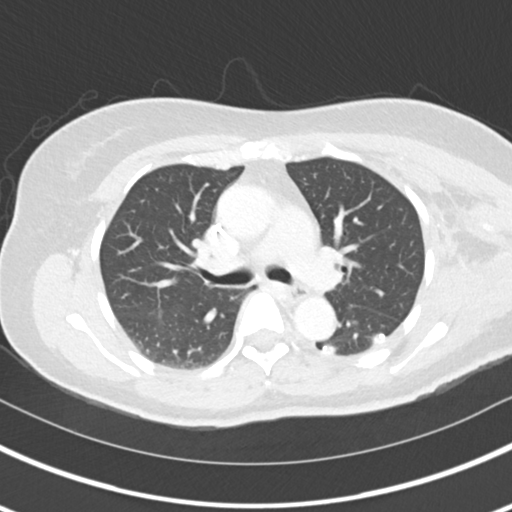
[im 86/123  mediastinal]
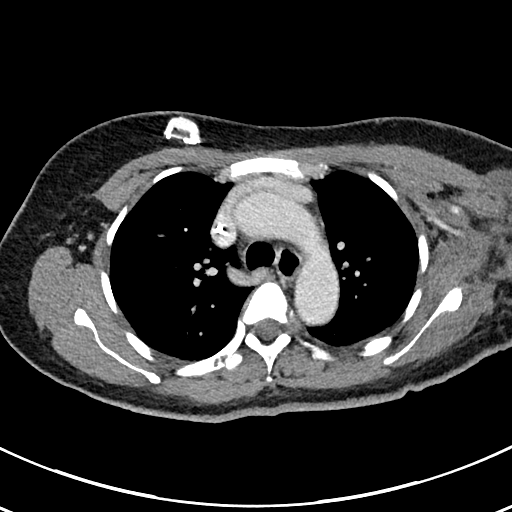
[im 86/123  lung]
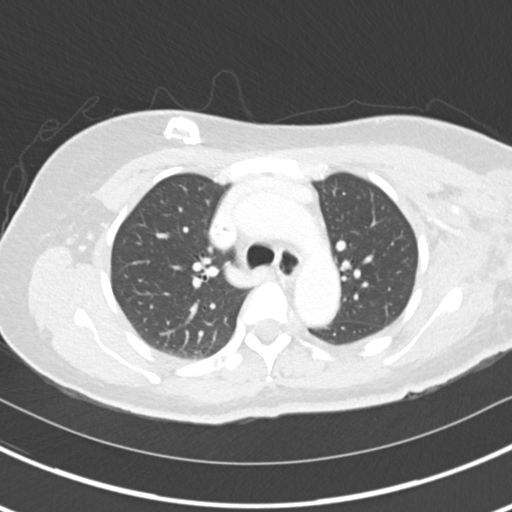
[im 95/123  lung]
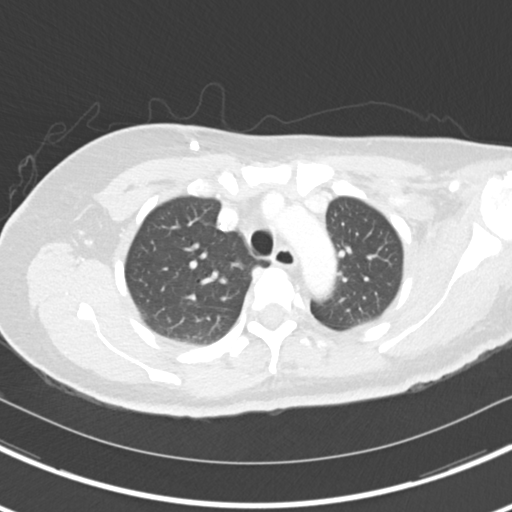
[im 104/123  lung]
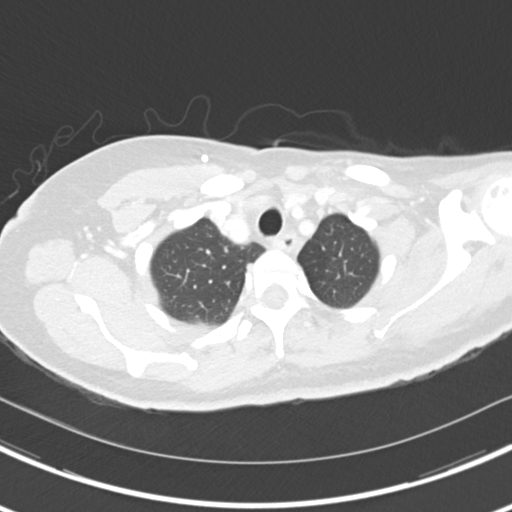
[im 113/123  lung]
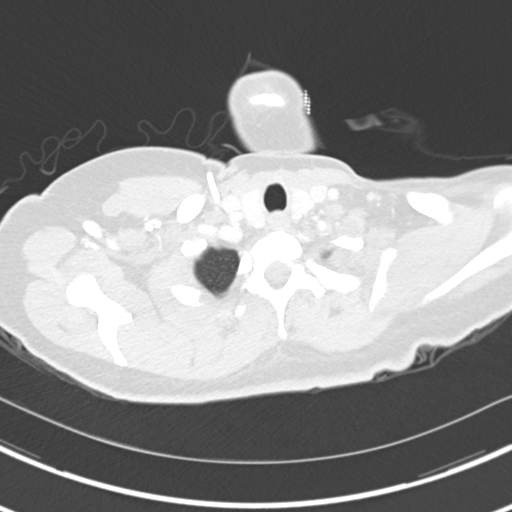

[Series 6: coronal · coronal · 0.54mm/px · 3 of 106 slices shown]
[im 22/106  lung]
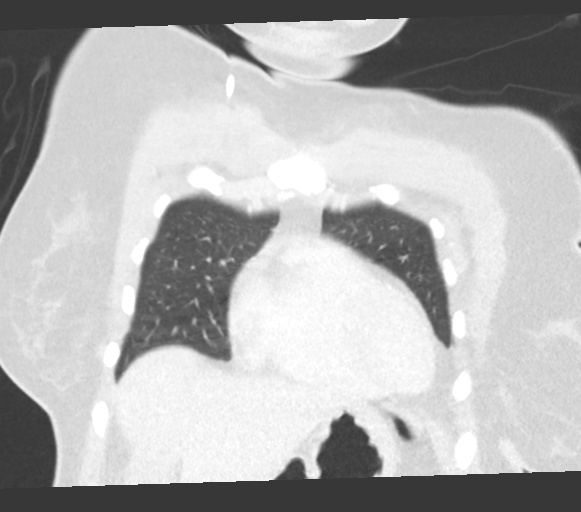
[im 43/106  lung]
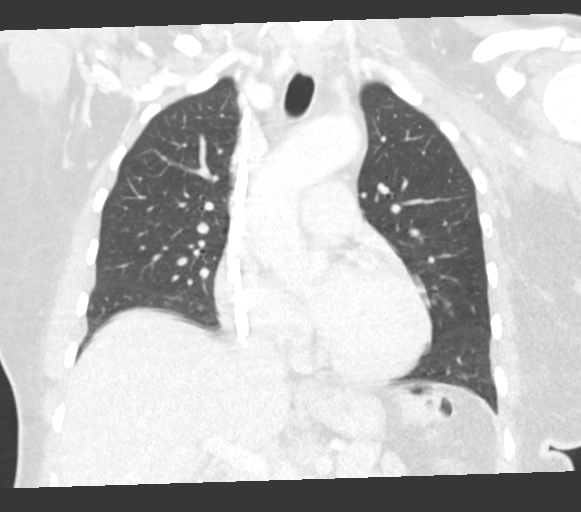
[im 64/106  lung]
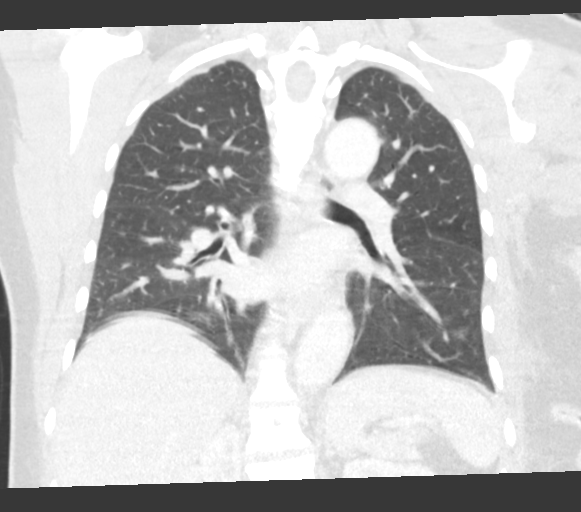

[15 of 36 positions shown; findings below may reference images not displayed]

FINDINGS: Cardiovascular: Right IJ Port-A-Cath terminates in the right atrium.
Heart is enlarged. No pericardial effusion.

Mediastinum/Nodes: No pathologically enlarged mediastinal, hilar,
internal mammary or axillary lymph nodes. Decreased
thickening/stranding in the high left axilla, without a discrete
measurable lesion.

Lungs/Pleura: 2 mm peripheral right upper lobe nodule (5/39),
decreased in size from 3 mm on [DATE]. 3 mm nodule along the
minor fissure (5/56), unchanged from [DATE]. Small bore chest
tube remains in place in the posterior left hemithorax. No pleural
fluid or pneumothorax. Airway is unremarkable.

Upper Abdomen: Visualized portions of the liver, gallbladder,
adrenal glands, kidneys, spleen, pancreas, stomach and bowel are
grossly unremarkable.

Musculoskeletal: Mottled sclerosis and lucencies are seen throughout
the visualized osseous structures, as before. Residual soft tissue
lesion in the lateral left breast, incompletely imaged.
IMPRESSION: 1. Residual soft tissue lesion in the lateral left breast, partially
imaged. Associated but improving soft tissue thickening, nodularity
and stranding in the left axilla without a discrete measurable
lesion.
2. Resolved left pleural effusion with left chest tube in place.
3. 2 mm right upper lobe nodule, decreased in size from [DATE].
4. Diffuse osseous metastatic disease, as before.

## 2020-03-24 MED ORDER — IOHEXOL 300 MG/ML  SOLN
75.0000 mL | Freq: Once | INTRAMUSCULAR | Status: AC | PRN
  Administered 2020-03-24: 75 mL via INTRAVENOUS

## 2020-03-24 MED ORDER — SODIUM CHLORIDE (PF) 0.9 % IJ SOLN
INTRAMUSCULAR | Status: AC
  Filled 2020-03-24: qty 50

## 2020-03-26 ENCOUNTER — Inpatient Hospital Stay: Payer: 59

## 2020-03-26 ENCOUNTER — Inpatient Hospital Stay: Payer: 59 | Attending: Oncology | Admitting: Nurse Practitioner

## 2020-03-26 ENCOUNTER — Other Ambulatory Visit: Payer: Self-pay

## 2020-03-26 ENCOUNTER — Other Ambulatory Visit (HOSPITAL_COMMUNITY): Payer: Self-pay | Admitting: Oncology

## 2020-03-26 ENCOUNTER — Encounter: Payer: Self-pay | Admitting: Nurse Practitioner

## 2020-03-26 ENCOUNTER — Other Ambulatory Visit: Payer: Self-pay | Admitting: Nurse Practitioner

## 2020-03-26 VITALS — BP 122/81 | HR 73 | Temp 97.5°F | Resp 18 | Ht 61.0 in | Wt 143.7 lb

## 2020-03-26 DIAGNOSIS — Z5111 Encounter for antineoplastic chemotherapy: Secondary | ICD-10-CM | POA: Insufficient documentation

## 2020-03-26 DIAGNOSIS — Z95828 Presence of other vascular implants and grafts: Secondary | ICD-10-CM

## 2020-03-26 DIAGNOSIS — Z171 Estrogen receptor negative status [ER-]: Secondary | ICD-10-CM | POA: Diagnosis not present

## 2020-03-26 DIAGNOSIS — C7951 Secondary malignant neoplasm of bone: Secondary | ICD-10-CM | POA: Insufficient documentation

## 2020-03-26 DIAGNOSIS — C773 Secondary and unspecified malignant neoplasm of axilla and upper limb lymph nodes: Secondary | ICD-10-CM | POA: Insufficient documentation

## 2020-03-26 DIAGNOSIS — C50912 Malignant neoplasm of unspecified site of left female breast: Secondary | ICD-10-CM | POA: Insufficient documentation

## 2020-03-26 DIAGNOSIS — Z5189 Encounter for other specified aftercare: Secondary | ICD-10-CM | POA: Diagnosis not present

## 2020-03-26 DIAGNOSIS — C50412 Malignant neoplasm of upper-outer quadrant of left female breast: Secondary | ICD-10-CM | POA: Diagnosis not present

## 2020-03-26 LAB — CBC WITH DIFFERENTIAL (CANCER CENTER ONLY)
Abs Immature Granulocytes: 0.02 10*3/uL (ref 0.00–0.07)
Basophils Absolute: 0 10*3/uL (ref 0.0–0.1)
Basophils Relative: 1 %
Eosinophils Absolute: 0.1 10*3/uL (ref 0.0–0.5)
Eosinophils Relative: 2 %
HCT: 27.9 % — ABNORMAL LOW (ref 36.0–46.0)
Hemoglobin: 7.9 g/dL — ABNORMAL LOW (ref 12.0–15.0)
Immature Granulocytes: 1 %
Lymphocytes Relative: 14 %
Lymphs Abs: 0.4 10*3/uL — ABNORMAL LOW (ref 0.7–4.0)
MCH: 27.6 pg (ref 26.0–34.0)
MCHC: 28.3 g/dL — ABNORMAL LOW (ref 30.0–36.0)
MCV: 97.6 fL (ref 80.0–100.0)
Monocytes Absolute: 1 10*3/uL (ref 0.1–1.0)
Monocytes Relative: 30 %
Neutro Abs: 1.7 10*3/uL (ref 1.7–7.7)
Neutrophils Relative %: 52 %
Platelet Count: 252 10*3/uL (ref 150–400)
RBC: 2.86 MIL/uL — ABNORMAL LOW (ref 3.87–5.11)
RDW: 22 % — ABNORMAL HIGH (ref 11.5–15.5)
WBC Count: 3.2 10*3/uL — ABNORMAL LOW (ref 4.0–10.5)
nRBC: 1.9 % — ABNORMAL HIGH (ref 0.0–0.2)

## 2020-03-26 LAB — CMP (CANCER CENTER ONLY)
ALT: 14 U/L (ref 0–44)
AST: 18 U/L (ref 15–41)
Albumin: 4 g/dL (ref 3.5–5.0)
Alkaline Phosphatase: 204 U/L — ABNORMAL HIGH (ref 38–126)
Anion gap: 6 (ref 5–15)
BUN: 9 mg/dL (ref 6–20)
CO2: 29 mmol/L (ref 22–32)
Calcium: 9.2 mg/dL (ref 8.9–10.3)
Chloride: 107 mmol/L (ref 98–111)
Creatinine: 0.7 mg/dL (ref 0.44–1.00)
GFR, Est AFR Am: >60 mL/min (ref >60)
GFR, Estimated: >60 mL/min (ref >60)
Glucose, Bld: 96 mg/dL (ref 70–99)
Potassium: 4.1 mmol/L (ref 3.5–5.1)
Sodium: 142 mmol/L (ref 135–145)
Total Bilirubin: 0.3 mg/dL (ref 0.3–1.2)
Total Protein: 6.9 g/dL (ref 6.5–8.1)

## 2020-03-26 LAB — SAMPLE TO BLOOD BANK

## 2020-03-26 MED ORDER — SODIUM CHLORIDE 0.9 % IV SOLN
10.0000 mg | Freq: Once | INTRAVENOUS | Status: AC
  Administered 2020-03-26: 10 mg via INTRAVENOUS
  Filled 2020-03-26: qty 10

## 2020-03-26 MED ORDER — SODIUM CHLORIDE 0.9 % IV SOLN
500.0000 mg/m2 | Freq: Once | INTRAVENOUS | Status: AC
  Administered 2020-03-26: 840 mg via INTRAVENOUS
  Filled 2020-03-26: qty 42

## 2020-03-26 MED ORDER — SODIUM CHLORIDE 0.9 % IV SOLN
150.0000 mg | Freq: Once | INTRAVENOUS | Status: AC
  Administered 2020-03-26: 150 mg via INTRAVENOUS
  Filled 2020-03-26: qty 150

## 2020-03-26 MED ORDER — DOXORUBICIN HCL CHEMO IV INJECTION 2 MG/ML
50.0000 mg/m2 | Freq: Once | INTRAVENOUS | Status: AC
  Administered 2020-03-26: 84 mg via INTRAVENOUS
  Filled 2020-03-26: qty 42

## 2020-03-26 MED ORDER — HEPARIN SOD (PORK) LOCK FLUSH 100 UNIT/ML IV SOLN
500.0000 [IU] | Freq: Once | INTRAVENOUS | Status: AC | PRN
  Administered 2020-03-26: 500 [IU]
  Filled 2020-03-26: qty 5

## 2020-03-26 MED ORDER — LIDOCAINE-PRILOCAINE 2.5-2.5 % EX CREA: 1.0000 "application " | TOPICAL_CREAM | CUTANEOUS | 5 refills | Status: AC

## 2020-03-26 MED ORDER — SODIUM CHLORIDE 0.9 % IV SOLN
Freq: Once | INTRAVENOUS | Status: AC
  Filled 2020-03-26: qty 250

## 2020-03-26 MED ORDER — SODIUM CHLORIDE 0.9% FLUSH
10.0000 mL | INTRAVENOUS | Status: DC | PRN
  Administered 2020-03-26: 10 mL
  Filled 2020-03-26: qty 10

## 2020-03-26 MED ORDER — SODIUM CHLORIDE 0.9% FLUSH
10.0000 mL | Freq: Once | INTRAVENOUS | Status: AC
  Administered 2020-03-26: 10 mL
  Filled 2020-03-26: qty 10

## 2020-03-26 MED ORDER — PALONOSETRON HCL INJECTION 0.25 MG/5ML
0.2500 mg | Freq: Once | INTRAVENOUS | Status: AC
  Administered 2020-03-26: 0.25 mg via INTRAVENOUS

## 2020-03-26 MED ORDER — PALONOSETRON HCL INJECTION 0.25 MG/5ML
INTRAVENOUS | Status: AC
  Filled 2020-03-26: qty 5

## 2020-03-26 NOTE — Patient Instructions (Signed)
[facility] Discharge Instructions for Patients Receiving Chemotherapy  Today you received the following chemotherapy agents Doxorubicin (ADRIAMYCIN) & Cyclophosphamide (CYTOXAN).  To help prevent nausea and vomiting after your treatment, we encourage you to take your nausea medication as prescribed.   If you develop nausea and vomiting that is not controlled by your nausea medication, call the clinic.   BELOW ARE SYMPTOMS THAT SHOULD BE REPORTED IMMEDIATELY:  *FEVER GREATER THAN 100.5 F  *CHILLS WITH OR WITHOUT FEVER  NAUSEA AND VOMITING THAT IS NOT CONTROLLED WITH YOUR NAUSEA MEDICATION  *UNUSUAL SHORTNESS OF BREATH  *UNUSUAL BRUISING OR BLEEDING  TENDERNESS IN MOUTH AND THROAT WITH OR WITHOUT PRESENCE OF ULCERS  *URINARY PROBLEMS  *BOWEL PROBLEMS  UNUSUAL RASH Items with * indicate a potential emergency and should be followed up as soon as possible.  Feel free to call the clinic should you have any questions or concerns. The clinic phone number is [phone].  Please show the CHEMO ALERT CARD at check-in to the Emergency Department and triage nurse.   

## 2020-03-26 NOTE — Progress Notes (Addendum)
[place] OFFICE PROGRESS NOTE   Diagnosis: Breast cancer  INTERVAL HISTORY:   Abigail [name] returns as scheduled.  She completed cycle 5 Adriamycin/Cytoxan 03/05/2020.  She had mild nausea.  No mouth sores.  No diarrhea.  She denies pain.  No shortness of breath.  She would like the chest tube removed.  Objective:  Vital signs in last 24 hours:  Blood pressure 122/81, pulse 73, temperature (!) 97.5 F (36.4 C), temperature source Temporal, resp. rate 18, height _0  (1.549 m), weight 143 lb 11.2 oz (65.2 kg), SpO2 100 %.    HEENT: No thrush or ulcers. Resp: Lungs clear bilaterally.  Left chest tube in place. Cardio: Regular rate and rhythm. GI: No hepatomegaly. Vascular: No leg edema. Skin: Mild skin thickening/nodularity at the left axillary fold; no nodularity at the left back. Port-A-Cath without erythema.  Lab Results:  Lab Results  Component Value Date   WBC 3.2 (L) 03/26/2020   HGB 7.9 (L) 03/26/2020   HCT 27.9 (L) 03/26/2020   MCV 97.6 03/26/2020   PLT 252 03/26/2020   NEUTROABS 1.7 03/26/2020    Imaging:  No results found.  Medications: I have reviewed the patient's current medications.  Assessment/Plan: .Metastatic breast cancer  CT chest 10/25/2019-left breast mass left axillary lymph nodes, abnormal appearance of the left concerning for tumor involvement, mediastinal adenopathy, diffuse sclerotic and lytic lesions, left pleural effusion  CT abdomen/pelvis 10/26/2019 enhancing left breast mass with left axillary lymphadenopathy and tumor involved the left chest wall the latissimus dorsi segment 4B liver lesion, diffuse lytic/sclerotic lesions  Ultrasound-guided biopsy of left liver lesion 10/29/1999-metastatic carcinoma, cytokeratin andGATA3positive consistent with metastatic breast cancer. HER-2 negative, ER negative, PR negative, Ki-6720%. PD-L1 <1% expression   Invitae hereditary panel negative  Initiated systemic chemotherapy  weekly abraxane 100 mg/m2 days 1, 8, 15 q28 days   Cycle 1 day 1 on 11/14/19  Cycle 1 day 8 on 11/21/19 (hg 7.5, receive 1 unit RBCs)  Cycle 1 day 15 on 11/28/2019  Cycle 1 Adriamycin/Cytoxan 12/12/2019  Cycle 2 Adriamycin/Cytoxan 01/02/2020  Cycle 3 Adriamycin/Cytoxan 01/23/2020  Cycle 4 Adriamycin/Cytoxan 02/13/2020  Cycle 5 Adriamycin/Cytoxan 03/05/2020  Cycle 6 Adriamycin/Cytoxan 03/26/2020  Restaging chest CT 03/24/2020-residual soft tissue lesion in the lateral left breast, partially imaged.  Associated with improving soft tissue thickening, nodularity and stranding in the left axilla without a discrete measurable lesion.  Resolved left pleural effusion with left chest tube in place.  2 mm right upper lobe nodule decreased in size from 10/25/2019.  Diffuse osseous metastatic disease. 2. Left upper extremity DVT-left subclavian, axillary, and brachial vein thrombosis confirmed on  Anticoagulation with apixaban, changed to parenteral hospital admission1/14/2021transition to twice daily Lovenox at discharge; transition to once daily 11/08/2019.  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 Prophylactic ciprofloxacin initiated. [facility] admission 12/19/2019-acute respiratory failure with hypoxia, large left pleural effusion, ? HCAP, pancytopenia    Disposition: Abigail [name] appears stable.  She has completed 5 cycles of Adriamycin/Cytoxan.  Restaging  chest CT shows improvement.  Plan to proceed with cycle 6 today as scheduled.  We reviewed the CBC from today.  Counts adequate to proceed with treatment.  She will receive  white cell growth factor support.  Total cumulative Adriamycin dose is 260 mg/m.  We are referring her for a follow-up 2D echo.  She will return for lab, follow-up, chemotherapy in 3 weeks.  She will contact the office in the interim with any problems.  Patient seen with Dr. Benay Spice.  Ned Card ANP/GNP-BC   03/26/2020  12:28 PM This was a shared visit with Ned Card.  Abigail [name] appears well.  She has completed 5 cycles of Adriamycin/Cytoxan.  There has been marked clinical and radiologic improvement.  We reviewed the restaging chest CT with her.  She will complete another cycle of Adriamycin/Cytoxan today.  We will consider switching to a maintenance regimen when the cumulative Adriamycin dose is over 350 mg/m.  She will be referred for a repeat echocardiogram after this cycle. The Pleurx catheter will be removed. [name], MD

## 2020-03-28 ENCOUNTER — Inpatient Hospital Stay: Payer: 59

## 2020-03-28 ENCOUNTER — Other Ambulatory Visit: Payer: Self-pay

## 2020-03-28 VITALS — BP 112/76 | HR 78 | Temp 98.4°F | Resp 18

## 2020-03-28 DIAGNOSIS — Z5111 Encounter for antineoplastic chemotherapy: Secondary | ICD-10-CM | POA: Diagnosis not present

## 2020-03-28 DIAGNOSIS — Z171 Estrogen receptor negative status [ER-]: Secondary | ICD-10-CM

## 2020-03-28 MED ORDER — PEGFILGRASTIM-CBQV 6 MG/0.6ML ~~LOC~~ SOSY
PREFILLED_SYRINGE | SUBCUTANEOUS | Status: AC
  Filled 2020-03-28: qty 0.6

## 2020-03-28 MED ORDER — PEGFILGRASTIM-CBQV 6 MG/0.6ML ~~LOC~~ SOSY
6.0000 mg | PREFILLED_SYRINGE | Freq: Once | SUBCUTANEOUS | Status: AC
  Administered 2020-03-28: 6 mg via SUBCUTANEOUS

## 2020-03-28 NOTE — Patient Instructions (Signed)

## 2020-04-07 ENCOUNTER — Other Ambulatory Visit: Payer: Self-pay

## 2020-04-07 ENCOUNTER — Ambulatory Visit (HOSPITAL_COMMUNITY)
Admission: RE | Admit: 2020-04-07 | Discharge: 2020-04-07 | Disposition: A | Discharge status: Home / Self Care | Payer: 59 | Source: Ambulatory Visit | Attending: Nurse Practitioner | Admitting: Nurse Practitioner

## 2020-04-07 DIAGNOSIS — Z170 Estrogen receptor positive status [ER+]: Secondary | ICD-10-CM | POA: Insufficient documentation

## 2020-04-07 DIAGNOSIS — E785 Hyperlipidemia, unspecified: Secondary | ICD-10-CM | POA: Insufficient documentation

## 2020-04-07 DIAGNOSIS — Z171 Estrogen receptor negative status [ER-]: Secondary | ICD-10-CM | POA: Diagnosis not present

## 2020-04-07 DIAGNOSIS — C50412 Malignant neoplasm of upper-outer quadrant of left female breast: Secondary | ICD-10-CM | POA: Diagnosis not present

## 2020-04-07 NOTE — Progress Notes (Signed)
  Echocardiogram 2D Echocardiogram has been performed.  Abigail [name] 04/07/2020, 1:44 PM

## 2020-04-08 ENCOUNTER — Other Ambulatory Visit: Payer: Self-pay | Admitting: Radiology

## 2020-04-08 ENCOUNTER — Other Ambulatory Visit (HOSPITAL_COMMUNITY)
Admission: RE | Admit: 2020-04-08 | Discharge: 2020-04-08 | Disposition: A | Discharge status: Home / Self Care | Payer: 59 | Source: Ambulatory Visit | Attending: Nurse Practitioner | Admitting: Nurse Practitioner

## 2020-04-08 DIAGNOSIS — Z20822 Contact with and (suspected) exposure to covid-19: Secondary | ICD-10-CM | POA: Insufficient documentation

## 2020-04-08 DIAGNOSIS — Z01812 Encounter for preprocedural laboratory examination: Secondary | ICD-10-CM | POA: Insufficient documentation

## 2020-04-08 LAB — SARS CORONAVIRUS 2 (TAT 6-24 HRS): SARS Coronavirus 2: NEGATIVE

## 2020-04-09 ENCOUNTER — Encounter (HOSPITAL_COMMUNITY): Payer: Self-pay

## 2020-04-09 ENCOUNTER — Other Ambulatory Visit: Payer: Self-pay

## 2020-04-09 ENCOUNTER — Ambulatory Visit (HOSPITAL_COMMUNITY)
Admission: RE | Admit: 2020-04-09 | Discharge: 2020-04-09 | Disposition: A | Discharge status: Home / Self Care | Payer: 59 | Source: Ambulatory Visit | Attending: Nurse Practitioner | Admitting: Nurse Practitioner

## 2020-04-09 DIAGNOSIS — J910 Malignant pleural effusion: Secondary | ICD-10-CM | POA: Insufficient documentation

## 2020-04-09 DIAGNOSIS — C7951 Secondary malignant neoplasm of bone: Secondary | ICD-10-CM | POA: Insufficient documentation

## 2020-04-09 DIAGNOSIS — E785 Hyperlipidemia, unspecified: Secondary | ICD-10-CM | POA: Diagnosis not present

## 2020-04-09 DIAGNOSIS — Z4682 Encounter for fitting and adjustment of non-vascular catheter: Secondary | ICD-10-CM | POA: Diagnosis present

## 2020-04-09 DIAGNOSIS — Z7901 Long term (current) use of anticoagulants: Secondary | ICD-10-CM | POA: Insufficient documentation

## 2020-04-09 DIAGNOSIS — C7981 Secondary malignant neoplasm of breast: Secondary | ICD-10-CM | POA: Insufficient documentation

## 2020-04-09 DIAGNOSIS — Z79899 Other long term (current) drug therapy: Secondary | ICD-10-CM | POA: Insufficient documentation

## 2020-04-09 DIAGNOSIS — C50412 Malignant neoplasm of upper-outer quadrant of left female breast: Secondary | ICD-10-CM

## 2020-04-09 HISTORY — PX: IR REMOVAL OF PLURAL CATH W/CUFF: IMG5346

## 2020-04-09 LAB — CBC WITH DIFFERENTIAL/PLATELET
Abs Immature Granulocytes: 0.07 10*3/uL (ref 0.00–0.07)
Basophils Absolute: 0 10*3/uL (ref 0.0–0.1)
Basophils Relative: 1 %
Eosinophils Absolute: 0.2 10*3/uL (ref 0.0–0.5)
Eosinophils Relative: 4 %
HCT: 28.5 % — ABNORMAL LOW (ref 36.0–46.0)
Hemoglobin: 7.9 g/dL — ABNORMAL LOW (ref 12.0–15.0)
Immature Granulocytes: 2 %
Lymphocytes Relative: 12 %
Lymphs Abs: 0.5 10*3/uL — ABNORMAL LOW (ref 0.7–4.0)
MCH: 28 pg (ref 26.0–34.0)
MCHC: 27.7 g/dL — ABNORMAL LOW (ref 30.0–36.0)
MCV: 101.1 fL — ABNORMAL HIGH (ref 80.0–100.0)
Monocytes Absolute: 0.7 10*3/uL (ref 0.1–1.0)
Monocytes Relative: 18 %
Neutro Abs: 2.6 10*3/uL (ref 1.7–7.7)
Neutrophils Relative %: 63 %
Platelets: 236 10*3/uL (ref 150–400)
RBC: 2.82 MIL/uL — ABNORMAL LOW (ref 3.87–5.11)
RDW: 20.8 % — ABNORMAL HIGH (ref 11.5–15.5)
WBC: 4.1 10*3/uL (ref 4.0–10.5)
nRBC: 0 % (ref 0.0–0.2)

## 2020-04-09 LAB — PROTIME-INR
INR: 1.2 (ref 0.8–1.2)
Prothrombin Time: 14.5 seconds (ref 11.4–15.2)

## 2020-04-09 MED ORDER — MIDAZOLAM HCL 2 MG/2ML IJ SOLN
INTRAMUSCULAR | Status: AC
  Filled 2020-04-09: qty 2

## 2020-04-09 MED ORDER — SODIUM CHLORIDE 0.9 % IV SOLN: INTRAVENOUS | Status: DC

## 2020-04-09 MED ORDER — FENTANYL CITRATE (PF) 100 MCG/2ML IJ SOLN
INTRAMUSCULAR | Status: AC
  Filled 2020-04-09: qty 2

## 2020-04-09 MED ORDER — HEPARIN SOD (PORK) LOCK FLUSH 100 UNIT/ML IV SOLN
INTRAVENOUS | Status: AC
  Filled 2020-04-09: qty 5

## 2020-04-09 MED ORDER — LIDOCAINE HCL 1 % IJ SOLN
INTRAMUSCULAR | Status: AC
  Filled 2020-04-09: qty 20

## 2020-04-09 MED ORDER — CEFAZOLIN SODIUM-DEXTROSE 2-4 GM/100ML-% IV SOLN
INTRAVENOUS | Status: AC
  Administered 2020-04-09: 2 g via INTRAVENOUS
  Filled 2020-04-09: qty 100

## 2020-04-09 MED ORDER — CEFAZOLIN SODIUM-DEXTROSE 2-4 GM/100ML-% IV SOLN: 2.0000 g | INTRAVENOUS | Status: AC

## 2020-04-09 NOTE — H&P (Addendum)
Referring Physician(s): [place]  Supervising Physician: Jacqulynn Cadet  Patient Status:  [facility]  Chief Complaint:  "I'm getting my chest tube out"  Subjective: Patient familiar to IR service from left liver lesion biopsy on 10/29/2019, Port-A-Cath placement on 11/07/2019, left thoracenteses on 11/09/2019, 11/28/2019 and 12/11/2019.  She eventually underwent left Pleurx catheter placement on 12/20/2019.  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  Lately she has had very minimal output from the left chest drain and follow-up CT chest on 6/14 revealed:    Residual soft tissue lesion in the lateral left breast, partially imaged. Associated but improving soft tissue thickening, nodularity and stranding in the left axilla without a discrete measurable lesion. 2. Resolved left pleural effusion with left chest tube in place. 3. 2 mm right upper lobe nodule, decreased in size from 10/25/2019. 4. Diffuse osseous metastatic disease, as before.  She presents today for left Pleurx catheter removal. She currently denies fever, headache, chest pain, dyspnea, cough, abdominal/back pain, nausea, vomiting or bleeding.She is anxious.   Past Medical History:  Diagnosis Date  . Allergic rhinitis   . Hyperlipidemia    LDL 150's '11   Past Surgical History:  Procedure Laterality Date  . CESAREAN SECTION    . IR IMAGING GUIDED PORT INSERTION  11/07/2019  . IR PERC PLEURAL DRAIN W/INDWELL CATH W/IMG GUIDE  12/20/2019  . IR THORACENTESIS ASP PLEURAL SPACE W/IMG GUIDE  11/09/2019  . IR US GUIDE BX ASP/DRAIN  10/29/2019     Allergies: Shellfish allergy and Sulfamethoxazole  Medications: Prior to Admission medications   Medication Sig Start Date End Date Taking? Authorizing Provider  dexamethasone (DECADRON) 4 MG tablet Take 2 tablets (8 mg total) by mouth 2 (two) times daily. Take 8 mg twice a day for 2 days starting the day after each  chemotherapy 02/13/20   Ladell Pier, MD  guaifenesin (ROBITUSSIN) 100 MG/5ML syrup Take 200 mg by mouth 3 (three) times daily as needed for cough.    [provider]  HYDROcodone-acetaminophen (NORCO/VICODIN) 5-325 MG tablet Take 1 tablet by mouth every 8 (eight) hours as needed for moderate pain or severe pain. Patient not taking: Reported on 02/13/2020 01/23/20   Owens Shark, NP  lidocaine-prilocaine (EMLA) cream Apply 1 application topically as directed. Apply to port 1 hour prior to stick and cover with plastic wrap 03/26/20   Owens Shark, NP  ondansetron (ZOFRAN) 8 MG tablet Take 1 tablet (8 mg total) by mouth every 8 (eight) hours as needed for nausea or vomiting. 11/02/19   Ladell Pier, MD  potassium chloride SA (KLOR-CON) 20 MEQ tablet Take 1 tablet (20 mEq total) by mouth daily. Patient not taking: Reported on 02/13/2020 12/12/19   Ladell Pier, MD  rivaroxaban (XARELTO) 20 MG TABS tablet Take 1 tablet (20 mg total) by mouth daily with supper. 02/13/20   Ladell Pier, MD     Vital Signs: BP 130/89 (BP Location: Right Arm)   Pulse 89   Temp 98 F (36.7 C) (Oral)   Resp 18   SpO2 100%   Physical Exam awake, alert.  Chest clear to auscultation bilaterally.  Right chest wall port and left Pleurx catheter in place.  Heart with regular rate and rhythm.  Abdomen soft, positive bowel sounds, nontender.  No lower extremity edema.  Imaging: ECHOCARDIOGRAM COMPLETE  Result Date: 04/07/2020    ECHOCARDIOGRAM REPORT   Patient Name:  Abigail [name] Date of Exam: 04/07/2020 Medical Rec #:  [id number]    Height:       61.0 in Accession #:    [id number]   Weight:       143.7 lb Date of Birth:  [birth year]    BSA:          1.641 m Patient Age:    54 years     BP:           112/76 mmHg Patient Gender: F            HR:           78 bpm. Exam Location:  Outpatient Procedure: 2D Echo, Cardiac Doppler, Color Doppler and Strain Analysis Indications:    Chemo evaluation  History:         Patient has prior history of Echocardiogram examinations, most                 recent 11/08/2019. Risk Factors:Dyslipidemia. Breast cancer.  Sonographer:    Dustin Flock Referring Phys: [place]  1. Left ventricular ejection fraction, by estimation, is 60 to 65%. The left ventricle has normal function. The left ventricle has no regional wall motion abnormalities. Left ventricular diastolic parameters were normal. The average left ventricular global longitudinal strain is -17.4 %.  2. Right ventricular systolic function is normal. The right ventricular size is normal. There is normal pulmonary artery systolic pressure.  3. The mitral valve is normal in structure. Trivial mitral valve regurgitation. No evidence of mitral stenosis.  4. The aortic valve is normal in structure. Aortic valve regurgitation is not visualized. No aortic stenosis is present.  5. The inferior vena cava is normal in size with greater than 50% respiratory variability, suggesting right atrial pressure of 3 mmHg. FINDINGS  Left Ventricle: Left ventricular ejection fraction, by estimation, is 60 to 65%. The left ventricle has normal function. The left ventricle has no regional wall motion abnormalities. The average left ventricular global longitudinal strain is -17.4 %. The left ventricular internal cavity size was normal in size. There is no left ventricular hypertrophy. Left ventricular diastolic parameters were normal. Right Ventricle: The right ventricular size is normal. No increase in right ventricular wall thickness. Right ventricular systolic function is normal. There is normal pulmonary artery systolic pressure. The tricuspid regurgitant velocity is 2.62 m/s, and  with an assumed right atrial pressure of 3 mmHg, the estimated right ventricular systolic pressure is 30.8 mmHg. Left Atrium: Left atrial size was normal in size. Right Atrium: Right atrial size was normal in size. Pericardium: There is no evidence of  pericardial effusion. Mitral Valve: The mitral valve is normal in structure. Trivial mitral valve regurgitation. No evidence of mitral valve stenosis. Tricuspid Valve: The tricuspid valve is grossly normal. Tricuspid valve regurgitation is trivial. No evidence of tricuspid stenosis. Aortic Valve: The aortic valve is normal in structure. Aortic valve regurgitation is not visualized. No aortic stenosis is present. Pulmonic Valve: The pulmonic valve was normal in structure. Pulmonic valve regurgitation is not visualized. No evidence of pulmonic stenosis. Aorta: The aortic root and ascending aorta are structurally normal, with no evidence of dilitation. Venous: The inferior vena cava is normal in size with greater than 50% respiratory variability, suggesting right atrial pressure of 3 mmHg. IAS/Shunts: The atrial septum is grossly normal.  LEFT VENTRICLE PLAX 2D LVIDd:         4.80 cm  Diastology LVIDs:  2.90 cm  LV e' lateral:   11.00 cm/s LV PW:         0.90 cm  LV E/e' lateral: 6.2 LV IVS:        0.70 cm  LV e' medial:    7.18 cm/s LVOT diam:     2.00 cm  LV E/e' medial:  9.6 LV SV:         81 LV SV Index:   49       2D Longitudinal Strain LVOT Area:     3.14 cm 2D Strain GLS Avg:     -17.4 %  RIGHT VENTRICLE RV Basal diam:  3.10 cm RV S prime:     10.20 cm/s TAPSE (M-mode): 2.8 cm LEFT ATRIUM             Index       RIGHT ATRIUM           Index LA diam:        2.80 cm 1.71 cm/m  RA Area:     14.60 cm LA Vol (A2C):   34.6 ml 21.08 ml/m RA Volume:   37.40 ml  22.79 ml/m LA Vol (A4C):   50.2 ml 30.59 ml/m LA Biplane Vol: 42.5 ml 25.90 ml/m  AORTIC VALVE LVOT Vmax:   114.00 cm/s LVOT Vmean:  79.200 cm/s LVOT VTI:    0.258 m  AORTA Ao Root diam: 2.80 cm MITRAL VALVE               TRICUSPID VALVE MV Area (PHT): 6.54 cm    TR Peak grad:   27.5 mmHg MV Decel Time: 116 msec    TR Vmax:        262.00 cm/s MV E velocity: 68.60 cm/s MV A velocity: 67.70 cm/s  SHUNTS MV E/A ratio:  1.01        Systemic VTI:  0.26  m                            Systemic Diam: 2.00 cm Mertie Moores MD Electronically signed by Mertie Moores MD Signature Date/Time: 04/07/2020/3:27:57 PM    Final     Labs:  CBC: Recent Labs    02/13/20 1141 02/20/20 0824 03/05/20 0846 03/26/20 1154  WBC 8.7 4.0 6.0 3.2*  HGB 7.1* 7.2* 7.2* 7.9*  HCT 24.8* 25.0* 25.7* 27.9*  PLT 142* 126* 220 252    COAGS: Recent Labs    10/28/19 1420 10/28/19 2114 10/29/19 0738 10/29/19 1016 10/30/19 0555  INR  --   --   --  1.1  --   APTT 59* 52* 109*  --  66*    BMP: Recent Labs    01/23/20 1224 02/13/20 1141 03/05/20 0846 03/26/20 1154  NA 143 142 145 142  K 3.9 4.0 3.6 4.1  CL 107 107 108 107  CO2 28 27 28 29   GLUCOSE 91 94 105* 96  BUN 8 8 8 9   CALCIUM 8.9 9.0 9.1 9.2  CREATININE 0.67 0.61 0.69 0.70  GFRNONAA >60 >60 >60 >60  GFRAA >60 >60 >60 >60    LIVER FUNCTION TESTS: Recent Labs    01/23/20 1224 02/13/20 1141 03/05/20 0846 03/26/20 1154  BILITOT 0.3 0.5 0.4 0.3  AST 22 25 22 18   ALT 17 13 15 14   ALKPHOS 354* 282* 240* 204*  PROT 6.6 6.7 6.7 6.9  ALBUMIN 3.4* 3.6 3.7 4.0    Assessment and Plan:  54 yo female with history of metastatic left breast carcinoma with recurrent malignant left pleural effusion; status post left Pleurx catheter placement on 12/20/2019.  Lately she has had very minimal output from the left chest drain and follow-up CT chest on 6/14 revealed:    Residual soft tissue lesion in the lateral left breast, partially imaged. Associated but improving soft tissue thickening, nodularity and stranding in the left axilla without a discrete measurable lesion. 2. Resolved left pleural effusion with left chest tube in place. 3. 2 mm right upper lobe nodule, decreased in size from 10/25/2019. 4. Diffuse osseous metastatic disease, as before.  She presents today for left Pleurx catheter removal.  Details/risks of procedure, including but not limited to, internal bleeding, infection, injury to  adjacent structures discussed with patient with her understanding and consent.   Electronically Signed: D. Rowe Robert, PA-C 04/09/2020, 12:20 PM   I spent a total of 20 minutes at the the patient's bedside AND on the patient's hospital floor or unit, greater than 50% of which was counseling/coordinating care for left Pleurx catheter removal

## 2020-04-09 NOTE — Discharge Instructions (Addendum)
Please call Interventional Radiology clinic [phone] with any questions or concerns.  You may remove your dressing and shower tomorrow.    Pleur-x catheter Removal, Adult, Care After This sheet gives you information about how to care for yourself after your procedure. Your health care provider may also give you more specific instructions. If you have problems or questions, contact your health care provider. What can I expect after the procedure? After the procedure, it is common to have:  Mild discomfort.  Slight bruising. Follow these instructions at home: Incision care   Follow instructions from your health care provider about how to take care of your incision. Make sure you: ? Wash your hands with soap and water before you change your bandage (dressing). If soap and water are not available, use hand sanitizer. ? Change your dressing as told by your health care provider. ? Leave stitches (sutures), skin glue, or adhesive strips in place. These skin closures may need to stay in place for 2 weeks or longer. If adhesive strip edges start to loosen and curl up, you may trim the loose edges. Do not remove adhesive strips completely unless your health care provider tells you to do that.  Check your incision area every day for signs of infection. Check for: ? More redness, swelling, or pain. ? More fluid or blood. ? Warmth. ? Pus or a bad smell.  Do not take baths, swim, or use a hot tub until your health care provider approves. Ask your health care provider if you can take showers or have a sponge bath. You may need to keep the incision area dry for a few days. General instructions  Take over-the-counter and prescription medicines only as told by your health care provider.  Return to your normal activities as told by your health care provider. Ask your health care provider what activities are safe.  Keep all follow-up visits as told by your health care provider. This is  important.  Do not drive for 24 hours if you were given a medicine to help you relax (sedative). Contact a health care provider if:  You have chills or fever.  You have pain that is not controlled with medicine.  You have more redness, swelling, or pain around your incision.  You have more fluid or blood coming from your incision.  Your incision feels warm to the touch.  You have pus or a bad smell coming from the incision. Get help right away if:  You have chest pain or trouble breathing.  You develop red streaking on your skin around your incision. This information is not intended to replace advice given to you by your health care provider. Make sure you discuss any questions you have with your health care provider. Document Revised: 09/09/2017 Document Reviewed: 03/10/2016 Elsevier Patient Education  2020 [employer].

## 2020-04-09 NOTE — Procedures (Signed)
Interventional Radiology Procedure Note  Procedure: Successful removal of LEFT Pleur-X catheter  Complications: None  Estimated Blood Loss: None  Recommendations: - DC home   Signed,  Criselda Peaches, MD

## 2020-04-14 ENCOUNTER — Other Ambulatory Visit: Payer: Self-pay | Admitting: Oncology

## 2020-04-16 ENCOUNTER — Inpatient Hospital Stay: Payer: 59

## 2020-04-16 ENCOUNTER — Inpatient Hospital Stay: Payer: 59 | Attending: Oncology | Admitting: Oncology

## 2020-04-16 ENCOUNTER — Other Ambulatory Visit: Payer: Self-pay

## 2020-04-16 VITALS — BP 117/76 | HR 69 | Temp 98.7°F | Resp 18 | Ht 61.0 in | Wt 145.2 lb

## 2020-04-16 DIAGNOSIS — C50412 Malignant neoplasm of upper-outer quadrant of left female breast: Secondary | ICD-10-CM | POA: Diagnosis not present

## 2020-04-16 DIAGNOSIS — C787 Secondary malignant neoplasm of liver and intrahepatic bile duct: Secondary | ICD-10-CM | POA: Insufficient documentation

## 2020-04-16 DIAGNOSIS — Z171 Estrogen receptor negative status [ER-]: Secondary | ICD-10-CM

## 2020-04-16 DIAGNOSIS — C7951 Secondary malignant neoplasm of bone: Secondary | ICD-10-CM | POA: Diagnosis present

## 2020-04-16 DIAGNOSIS — D701 Agranulocytosis secondary to cancer chemotherapy: Secondary | ICD-10-CM | POA: Diagnosis not present

## 2020-04-16 DIAGNOSIS — C50912 Malignant neoplasm of unspecified site of left female breast: Secondary | ICD-10-CM | POA: Diagnosis present

## 2020-04-16 DIAGNOSIS — Z5111 Encounter for antineoplastic chemotherapy: Secondary | ICD-10-CM | POA: Insufficient documentation

## 2020-04-16 DIAGNOSIS — Z95828 Presence of other vascular implants and grafts: Secondary | ICD-10-CM

## 2020-04-16 LAB — CBC WITH DIFFERENTIAL (CANCER CENTER ONLY)
Abs Immature Granulocytes: 0.05 10*3/uL (ref 0.00–0.07)
Basophils Absolute: 0 10*3/uL (ref 0.0–0.1)
Basophils Relative: 1 %
Eosinophils Absolute: 0.1 10*3/uL (ref 0.0–0.5)
Eosinophils Relative: 2 %
HCT: 29 % — ABNORMAL LOW (ref 36.0–46.0)
Hemoglobin: 8.4 g/dL — ABNORMAL LOW (ref 12.0–15.0)
Immature Granulocytes: 2 %
Lymphocytes Relative: 17 %
Lymphs Abs: 0.5 10*3/uL — ABNORMAL LOW (ref 0.7–4.0)
MCH: 28.3 pg (ref 26.0–34.0)
MCHC: 29 g/dL — ABNORMAL LOW (ref 30.0–36.0)
MCV: 97.6 fL (ref 80.0–100.0)
Monocytes Absolute: 0.6 10*3/uL (ref 0.1–1.0)
Monocytes Relative: 18 %
Neutro Abs: 2 10*3/uL (ref 1.7–7.7)
Neutrophils Relative %: 60 %
Platelet Count: 308 10*3/uL (ref 150–400)
RBC: 2.97 MIL/uL — ABNORMAL LOW (ref 3.87–5.11)
RDW: 19.9 % — ABNORMAL HIGH (ref 11.5–15.5)
WBC Count: 3.3 10*3/uL — ABNORMAL LOW (ref 4.0–10.5)
nRBC: 0 % (ref 0.0–0.2)

## 2020-04-16 LAB — CMP (CANCER CENTER ONLY)
ALT: 15 U/L (ref 0–44)
AST: 16 U/L (ref 15–41)
Albumin: 3.9 g/dL (ref 3.5–5.0)
Alkaline Phosphatase: 181 U/L — ABNORMAL HIGH (ref 38–126)
Anion gap: 10 (ref 5–15)
BUN: 13 mg/dL (ref 6–20)
CO2: 26 mmol/L (ref 22–32)
Calcium: 9.3 mg/dL (ref 8.9–10.3)
Chloride: 107 mmol/L (ref 98–111)
Creatinine: 0.75 mg/dL (ref 0.44–1.00)
GFR, Est AFR Am: >60 mL/min (ref >60)
GFR, Estimated: >60 mL/min (ref >60)
Glucose, Bld: 120 mg/dL — ABNORMAL HIGH (ref 70–99)
Potassium: 3.7 mmol/L (ref 3.5–5.1)
Sodium: 143 mmol/L (ref 135–145)
Total Bilirubin: 0.3 mg/dL (ref 0.3–1.2)
Total Protein: 7 g/dL (ref 6.5–8.1)

## 2020-04-16 MED ORDER — PALONOSETRON HCL INJECTION 0.25 MG/5ML
0.2500 mg | Freq: Once | INTRAVENOUS | Status: AC
  Administered 2020-04-16: 0.25 mg via INTRAVENOUS

## 2020-04-16 MED ORDER — SODIUM CHLORIDE 0.9 % IV SOLN
Freq: Once | INTRAVENOUS | Status: AC
  Filled 2020-04-16: qty 250

## 2020-04-16 MED ORDER — DOXORUBICIN HCL CHEMO IV INJECTION 2 MG/ML
50.0000 mg/m2 | Freq: Once | INTRAVENOUS | Status: AC
  Administered 2020-04-16: 84 mg via INTRAVENOUS
  Filled 2020-04-16: qty 42

## 2020-04-16 MED ORDER — SODIUM CHLORIDE 0.9 % IV SOLN
500.0000 mg/m2 | Freq: Once | INTRAVENOUS | Status: AC
  Administered 2020-04-16: 840 mg via INTRAVENOUS
  Filled 2020-04-16: qty 42

## 2020-04-16 MED ORDER — SODIUM CHLORIDE 0.9 % IV SOLN
10.0000 mg | Freq: Once | INTRAVENOUS | Status: AC
  Administered 2020-04-16: 10 mg via INTRAVENOUS
  Filled 2020-04-16: qty 10

## 2020-04-16 MED ORDER — SODIUM CHLORIDE 0.9% FLUSH
10.0000 mL | Freq: Once | INTRAVENOUS | Status: AC
  Administered 2020-04-16: 10 mL
  Filled 2020-04-16: qty 10

## 2020-04-16 MED ORDER — SODIUM CHLORIDE 0.9% FLUSH
10.0000 mL | INTRAVENOUS | Status: DC | PRN
  Administered 2020-04-16: 10 mL
  Filled 2020-04-16: qty 10

## 2020-04-16 MED ORDER — HEPARIN SOD (PORK) LOCK FLUSH 100 UNIT/ML IV SOLN
500.0000 [IU] | Freq: Once | INTRAVENOUS | Status: AC | PRN
  Administered 2020-04-16: 500 [IU]
  Filled 2020-04-16: qty 5

## 2020-04-16 MED ORDER — SODIUM CHLORIDE 0.9 % IV SOLN
150.0000 mg | Freq: Once | INTRAVENOUS | Status: AC
  Administered 2020-04-16: 150 mg via INTRAVENOUS
  Filled 2020-04-16: qty 150

## 2020-04-16 MED ORDER — PALONOSETRON HCL INJECTION 0.25 MG/5ML
INTRAVENOUS | Status: AC
  Filled 2020-04-16: qty 5

## 2020-04-16 NOTE — Patient Instructions (Signed)
[facility] Discharge Instructions for Patients Receiving Chemotherapy  Today you received the following chemotherapy agents: Adriamycin, Cytoxan  To help prevent nausea and vomiting after your treatment, we encourage you to take your nausea medication as directed.   If you develop nausea and vomiting that is not controlled by your nausea medication, call the clinic.   BELOW ARE SYMPTOMS THAT SHOULD BE REPORTED IMMEDIATELY:  *FEVER GREATER THAN 100.5 F  *CHILLS WITH OR WITHOUT FEVER  NAUSEA AND VOMITING THAT IS NOT CONTROLLED WITH YOUR NAUSEA MEDICATION  *UNUSUAL SHORTNESS OF BREATH  *UNUSUAL BRUISING OR BLEEDING  TENDERNESS IN MOUTH AND THROAT WITH OR WITHOUT PRESENCE OF ULCERS  *URINARY PROBLEMS  *BOWEL PROBLEMS  UNUSUAL RASH Items with * indicate a potential emergency and should be followed up as soon as possible.  Feel free to call the clinic should you have any questions or concerns. The clinic phone number is [phone].  Please show the CHEMO ALERT CARD at check-in to the Emergency Department and triage nurse.   

## 2020-04-16 NOTE — Patient Instructions (Signed)

## 2020-04-16 NOTE — Progress Notes (Signed)
[facility] OFFICE PROGRESS NOTE   Diagnosis: Breast cancer  INTERVAL HISTORY:   Ms. Mothershead completed another cycle of chemotherapy on 03/26/2020.  The Pleurx catheter was removed on 04/09/2020.  She reports mild nausea 3 to 4 days following chemotherapy.  No emesis.  No mouth sores.  No dyspnea.  She feels well.  Objective:  Vital signs in last 24 hours:  Blood pressure 117/76, pulse 69, temperature 98.7 F (37.1 C), temperature source Temporal, resp. rate 18, height _0  (1.549 m), weight 145 lb 3.2 oz (65.9 kg), SpO2 100 %.    HEENT: No thrush or ulcers Lymphatics: No cervical, supraclavicular, or left axillary nodes Resp: Lungs clear bilaterally with decreased breath sounds at the left posterior chest, no respiratory distress Cardio: Regular rate and rhythm, no gallop GI: No hepatomegaly Vascular: No arm or leg edema  Skin: Barely perceptible nodules at the left upper anterior chest near the pectoral fold  Portacath/PICC-without erythema  Lab Results:  Lab Results  Component Value Date   WBC 3.3 (L) 04/16/2020   HGB 8.4 (L) 04/16/2020   HCT 29.0 (L) 04/16/2020   MCV 97.6 04/16/2020   PLT 308 04/16/2020   NEUTROABS 2.0 04/16/2020    CMP  Lab Results  Component Value Date   NA 142 03/26/2020   K 4.1 03/26/2020   CL 107 03/26/2020   CO2 29 03/26/2020   GLUCOSE 96 03/26/2020   BUN 9 03/26/2020   CREATININE 0.70 03/26/2020   CALCIUM 9.2 03/26/2020   PROT 6.9 03/26/2020   ALBUMIN 4.0 03/26/2020   AST 18 03/26/2020   ALT 14 03/26/2020   ALKPHOS 204 (H) 03/26/2020   BILITOT 0.3 03/26/2020   GFRNONAA >60 03/26/2020   GFRAA >60 03/26/2020    Medications: I have reviewed the patient's current medications.   Assessment/Plan: 1.Metastatic breast cancer  CT chest 10/25/2019-left breast mass left axillary lymph nodes, abnormal appearance of the left concerning for tumor involvement, mediastinal adenopathy, diffuse sclerotic and lytic lesions,  left pleural effusion  CT abdomen/pelvis 10/26/2019 enhancing left breast mass with left axillary lymphadenopathy and tumor involved the left chest wall the latissimus dorsi segment 4B liver lesion, diffuse lytic/sclerotic lesions  Ultrasound-guided biopsy of left liver lesion 10/29/1999-metastatic carcinoma, cytokeratin andGATA3positive consistent with metastatic breast cancer. HER-2 negative, ER negative, PR negative, Ki-6720%. PD-L1 <1% expression   [employer] hereditary panel negative  Initiated systemic chemotherapy weekly abraxane 100 mg/m2 days 1, 8, 15 q28 days   Cycle 1 day 1 on 11/14/19  Cycle 1 day 8 on 11/21/19 (hg 7.5, receive 1 unit RBCs)  Cycle 1 day 15 on 11/28/2019  Cycle 1 Adriamycin/Cytoxan 12/12/2019  Cycle 2 Adriamycin/Cytoxan 01/02/2020  Cycle 3 Adriamycin/Cytoxan 01/23/2020  Cycle 4 Adriamycin/Cytoxan 02/13/2020  Cycle 5 Adriamycin/Cytoxan 03/05/2020  Cycle 6 Adriamycin/Cytoxan 03/26/2020  Restaging chest CT 03/24/2020-residual soft tissue lesion in the lateral left breast, partially imaged.  Associated with improving soft tissue thickening, nodularity and stranding in the left axilla without a discrete measurable lesion.  Resolved left pleural effusion with left chest tube in place.  2 mm right upper lobe nodule decreased in size from 10/25/2019.  Diffuse osseous metastatic disease.  Cycle 7 Adriamycin/Cytoxan 04/16/2020 2. Left upper extremity DVT-left subclavian, axillary, and brachial vein thrombosis confirmed on  Anticoagulation with apixaban, changed to parenteral hospital admission1/14/2021transition to twice daily Lovenox at discharge; transition to once daily 11/08/2019.  Lovenox discontinued and rivaroxaban anticoagulation started 01/15/2020 3.Anemia-likely secondary to metastatic breast cancer involving the bone marrow, stable  01/23/2020 4.Left pleural effusion-thoracentesis 11/09/2019, cytology positive for malignant cells;Pleurx catheter placed  12/20/2019 5.Port-A-Cath placement1/27/2021, interventional radiology 6.2D echo 11/08/2019-ejection fraction 55 to 60%, no pericardial effusion, echocardiogram 04/07/2020-LVEF 60-65% 7.Tachycardia-likely multifactorial secondary to cancer burden, anemia, pleural effusion-improved 8.Neutropenia secondary to chemotherapy 12/19/2019. Prophylactic ciprofloxacin initiated. [facility] admission 12/19/2019-acute respiratory failure with hypoxia, large left pleural effusion, ? HCAP, pancytopenia     Disposition: Ms. Schryver appears well.  She is tolerating the chemotherapy well.  She will complete another cycle of Adriamycin/Cytoxan today.  The cumulative dose of Adriamycin dose will be at 360 mg/m after today's treatment.  An echocardiogram last week revealed a normal left ventricular ejection fraction.  She will return for an office visit in 3 weeks.  We will decide on 1 more cycle of Adriamycin/Cytoxan versus changing to a maintenance regimen when she is here in 3 weeks.  Betsy Coder, MD  04/16/2020  9:42 AM

## 2020-04-18 ENCOUNTER — Telehealth: Payer: Self-pay | Admitting: Oncology

## 2020-04-18 ENCOUNTER — Inpatient Hospital Stay: Payer: 59

## 2020-04-18 ENCOUNTER — Other Ambulatory Visit: Payer: Self-pay

## 2020-04-18 VITALS — BP 114/77 | HR 81 | Temp 98.7°F | Resp 16

## 2020-04-18 DIAGNOSIS — C50412 Malignant neoplasm of upper-outer quadrant of left female breast: Secondary | ICD-10-CM

## 2020-04-18 DIAGNOSIS — Z5111 Encounter for antineoplastic chemotherapy: Secondary | ICD-10-CM | POA: Diagnosis not present

## 2020-04-18 MED ORDER — PEGFILGRASTIM-CBQV 6 MG/0.6ML ~~LOC~~ SOSY
PREFILLED_SYRINGE | SUBCUTANEOUS | Status: AC
  Filled 2020-04-18: qty 0.6

## 2020-04-18 MED ORDER — PEGFILGRASTIM-CBQV 6 MG/0.6ML ~~LOC~~ SOSY
6.0000 mg | PREFILLED_SYRINGE | Freq: Once | SUBCUTANEOUS | Status: AC
  Administered 2020-04-18: 6 mg via SUBCUTANEOUS

## 2020-04-18 NOTE — Telephone Encounter (Signed)
Scheduled appts per 7/7 los. Left voicemail with appt date and time.  

## 2020-04-18 NOTE — Patient Instructions (Signed)

## 2020-05-01 ENCOUNTER — Encounter: Payer: Self-pay | Admitting: Genetic Counselor

## 2020-05-01 DIAGNOSIS — Z1379 Encounter for other screening for genetic and chromosomal anomalies: Secondary | ICD-10-CM | POA: Insufficient documentation

## 2020-05-04 ENCOUNTER — Other Ambulatory Visit: Payer: Self-pay | Admitting: Oncology

## 2020-05-06 MED FILL — Dexamethasone Sodium Phosphate Inj 100 MG/10ML: INTRAMUSCULAR | Qty: 1 | Status: AC

## 2020-05-06 MED FILL — Fosaprepitant Dimeglumine For IV Infusion 150 MG (Base Eq): INTRAVENOUS | Qty: 5 | Status: AC

## 2020-05-07 ENCOUNTER — Telehealth: Payer: Self-pay | Admitting: Pharmacist

## 2020-05-07 ENCOUNTER — Inpatient Hospital Stay: Payer: 59

## 2020-05-07 ENCOUNTER — Inpatient Hospital Stay: Payer: 59 | Admitting: Oncology

## 2020-05-07 ENCOUNTER — Telehealth: Payer: Self-pay | Admitting: Oncology

## 2020-05-07 ENCOUNTER — Telehealth: Payer: Self-pay

## 2020-05-07 ENCOUNTER — Other Ambulatory Visit: Payer: Self-pay

## 2020-05-07 VITALS — BP 122/80 | HR 87 | Temp 97.9°F | Resp 16 | Ht 61.0 in | Wt 144.3 lb

## 2020-05-07 DIAGNOSIS — C50412 Malignant neoplasm of upper-outer quadrant of left female breast: Secondary | ICD-10-CM | POA: Diagnosis not present

## 2020-05-07 DIAGNOSIS — Z95828 Presence of other vascular implants and grafts: Secondary | ICD-10-CM

## 2020-05-07 DIAGNOSIS — Z171 Estrogen receptor negative status [ER-]: Secondary | ICD-10-CM

## 2020-05-07 DIAGNOSIS — Z5111 Encounter for antineoplastic chemotherapy: Secondary | ICD-10-CM | POA: Diagnosis not present

## 2020-05-07 LAB — CMP (CANCER CENTER ONLY)
ALT: 13 U/L (ref 0–44)
AST: 17 U/L (ref 15–41)
Albumin: 4 g/dL (ref 3.5–5.0)
Alkaline Phosphatase: 156 U/L — ABNORMAL HIGH (ref 38–126)
Anion gap: 7 (ref 5–15)
BUN: 10 mg/dL (ref 6–20)
CO2: 29 mmol/L (ref 22–32)
Calcium: 9.9 mg/dL (ref 8.9–10.3)
Chloride: 107 mmol/L (ref 98–111)
Creatinine: 0.71 mg/dL (ref 0.44–1.00)
GFR, Est AFR Am: >60 mL/min (ref >60)
GFR, Estimated: >60 mL/min (ref >60)
Glucose, Bld: 103 mg/dL — ABNORMAL HIGH (ref 70–99)
Potassium: 3.7 mmol/L (ref 3.5–5.1)
Sodium: 143 mmol/L (ref 135–145)
Total Bilirubin: 0.2 mg/dL — ABNORMAL LOW (ref 0.3–1.2)
Total Protein: 6.9 g/dL (ref 6.5–8.1)

## 2020-05-07 LAB — CBC WITH DIFFERENTIAL (CANCER CENTER ONLY)
Abs Immature Granulocytes: 0.01 10*3/uL (ref 0.00–0.07)
Basophils Absolute: 0 10*3/uL (ref 0.0–0.1)
Basophils Relative: 1 %
Eosinophils Absolute: 0.1 10*3/uL (ref 0.0–0.5)
Eosinophils Relative: 3 %
HCT: 32.6 % — ABNORMAL LOW (ref 36.0–46.0)
Hemoglobin: 9.7 g/dL — ABNORMAL LOW (ref 12.0–15.0)
Immature Granulocytes: 0 %
Lymphocytes Relative: 13 %
Lymphs Abs: 0.4 10*3/uL — ABNORMAL LOW (ref 0.7–4.0)
MCH: 28.9 pg (ref 26.0–34.0)
MCHC: 29.8 g/dL — ABNORMAL LOW (ref 30.0–36.0)
MCV: 97 fL (ref 80.0–100.0)
Monocytes Absolute: 0.6 10*3/uL (ref 0.1–1.0)
Monocytes Relative: 20 %
Neutro Abs: 1.8 10*3/uL (ref 1.7–7.7)
Neutrophils Relative %: 63 %
Platelet Count: 310 10*3/uL (ref 150–400)
RBC: 3.36 MIL/uL — ABNORMAL LOW (ref 3.87–5.11)
RDW: 18.3 % — ABNORMAL HIGH (ref 11.5–15.5)
WBC Count: 2.8 10*3/uL — ABNORMAL LOW (ref 4.0–10.5)
nRBC: 0 % (ref 0.0–0.2)

## 2020-05-07 MED ORDER — SODIUM CHLORIDE 0.9% FLUSH
10.0000 mL | INTRAVENOUS | Status: DC | PRN
  Administered 2020-05-07: 10 mL via INTRAVENOUS
  Filled 2020-05-07: qty 10

## 2020-05-07 MED ORDER — CAPECITABINE 500 MG PO TABS: ORAL_TABLET | ORAL | 0 refills | Status: DC

## 2020-05-07 MED ORDER — SODIUM CHLORIDE 0.9% FLUSH
10.0000 mL | Freq: Once | INTRAVENOUS | Status: AC
  Administered 2020-05-07: 10 mL
  Filled 2020-05-07: qty 10

## 2020-05-07 MED ORDER — HEPARIN SOD (PORK) LOCK FLUSH 100 UNIT/ML IV SOLN
500.0000 [IU] | Freq: Once | INTRAVENOUS | Status: AC
  Administered 2020-05-07: 500 [IU] via INTRAVENOUS
  Filled 2020-05-07: qty 5

## 2020-05-07 NOTE — Telephone Encounter (Signed)
Oral Oncology Pharmacist Encounter  Received new prescription for Xeloda (capecitabine) for the treatment of ER/PR negative, HER-2 negative metastatic breast cancer, planned duration until disease progression or unacceptable drug toxicity.  Prescription dose and frequency assessed for appropriateness. OK for therapy initiation.   CMP and CBC w/ Diff from 05/07/2020 assessed, no relevant lab abnormalities.  Current medication list in Epic reviewed, no relevant DDIs with capecitabine identified.  Prescription has been e-scribed to the [facility] for benefits analysis and approval.  Oral Oncology Clinic will continue to follow for insurance authorization, copayment issues, initial counseling and start date.  Leron Croak, PharmD, BCPS Hematology/Oncology Clinical Pharmacist [facility] [phone] 05/07/2020 3:31 PM

## 2020-05-07 NOTE — Progress Notes (Signed)
[place] OFFICE PROGRESS NOTE   Diagnosis: Breast cancer  INTERVAL HISTORY:   Ms. Rigsbee completed another cycle of Adriamycin/cyclophosphamide on 04/16/2020.  No mouth sores, nausea, or dyspnea.  The left breast now feels normal.  No new complaint.  Objective:  Vital signs in last 24 hours:  Blood pressure 122/80, pulse 87, temperature 97.9 F (36.6 C), temperature source Temporal, resp. rate 16, height _0  (1.549 m), weight 144 lb 4.8 oz (65.5 kg), SpO2 100 %.    HEENT: No thrush or ulcers Resp: Lungs with decreased breath sounds at the left compared to the right chest, no respiratory distress Cardio: Regular rate and rhythm GI: No hepatomegaly Vascular: No leg edema  Skin: Tiny cutaneous nodules at the left upper lateral anterior chest near the axillary line Breast: Left breast without mass  Portacath/PICC-without erythema  Lab Results:  Lab Results  Component Value Date   WBC 2.8 (L) 05/07/2020   HGB 9.7 (L) 05/07/2020   HCT 32.6 (L) 05/07/2020   MCV 97.0 05/07/2020   PLT 310 05/07/2020   NEUTROABS 1.8 05/07/2020    CMP  Lab Results  Component Value Date   NA 143 05/07/2020   K 3.7 05/07/2020   CL 107 05/07/2020   CO2 29 05/07/2020   GLUCOSE 103 (H) 05/07/2020   BUN 10 05/07/2020   CREATININE 0.71 05/07/2020   CALCIUM 9.9 05/07/2020   PROT 6.9 05/07/2020   ALBUMIN 4.0 05/07/2020   AST 17 05/07/2020   ALT 13 05/07/2020   ALKPHOS 156 (H) 05/07/2020   BILITOT 0.2 (L) 05/07/2020   GFRNONAA >60 05/07/2020   GFRAA >60 05/07/2020    Medications: I have reviewed the patient's current medications.   Assessment/Plan: 1.Metastatic breast cancer  CT chest 10/25/2019-left breast mass left axillary lymph nodes, abnormal appearance of the left concerning for tumor involvement, mediastinal adenopathy, diffuse sclerotic and lytic lesions, left pleural effusion  CT abdomen/pelvis 10/26/2019 enhancing left breast mass with left axillary  lymphadenopathy and tumor involved the left chest wall the latissimus dorsi segment 4B liver lesion, diffuse lytic/sclerotic lesions  Ultrasound-guided biopsy of left liver lesion 10/29/1999-metastatic carcinoma, cytokeratin andGATA3positive consistent with metastatic breast cancer. HER-2 negative, ER negative, PR negative, Ki-6720%. PD-L1 <1% expression   Invitae hereditary panel negative  Initiated systemic chemotherapy weekly abraxane 100 mg/m2 days 1, 8, 15 q28 days   Cycle 1 day 1 on 11/14/19  Cycle 1 day 8 on 11/21/19 (hg 7.5, receive 1 unit RBCs)  Cycle 1 day 15 on 11/28/2019  Cycle 1 Adriamycin/Cytoxan 12/12/2019  Cycle 2 Adriamycin/Cytoxan 01/02/2020  Cycle 3 Adriamycin/Cytoxan 01/23/2020  Cycle 4 Adriamycin/Cytoxan 02/13/2020  Cycle 5 Adriamycin/Cytoxan 03/05/2020  Cycle 6 Adriamycin/Cytoxan 03/26/2020  Restaging chest CT 03/24/2020-residual soft tissue lesion in the lateral left breast, partially imaged.  Associated with improving soft tissue thickening, nodularity and stranding in the left axilla without a discrete measurable lesion.  Resolved left pleural effusion with left chest tube in place.  2 mm right upper lobe nodule decreased in size from 10/25/2019.  Diffuse osseous metastatic disease.  Cycle 7 Adriamycin/Cytoxan 04/16/2020 2. Left upper extremity DVT-left subclavian, axillary, and brachial vein thrombosis confirmed on  Anticoagulation with apixaban, changed to parenteral hospital admission1/14/2021transition to twice daily Lovenox at discharge; transition to once daily 11/08/2019.  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 Prophylactic ciprofloxacin initiated. [facility] admission 12/19/2019-acute respiratory failure with hypoxia, large left pleural effusion, ? HCAP, pancytopenia    Disposition: Ms. Rubalcava appears well.  She has completed 7 cycles of Adriamycin/cyclophosphamide with a cumulative Adriamycin dose of 360 mg/m.  She has entered clinical remission with this regimen.  I recommend discontinuing further doxorubicin due to the risk of cardiac toxicity.  We discussed treatment options including observation and maintenance capecitabine.  We also discussed referral to [facility] for a second opinion and to check clinical trial options.  I explained no therapy will be curative.  I expect the current remission to last 4 months as compared to years.  I recommend beginning capecitabine.  We reviewed potential toxicities associated with capecitabine including the chance for mucositis, diarrhea, and hematologic toxicity.  We discussed the rash, hyperpigmentation, sun sensitivity, and hand/foot syndrome associated with capecitabine.  She agrees to proceed.  She has extensive bone metastases.  She will begin Zometa prophylaxis.  We discussed toxicities associated with Zometa including the chance of osteonecrosis.  She will schedule an appointment with her dentist prior to beginning Zometa on 05/28/2020.  She will return for an office visit on 05/28/2020.  Betsy Coder, MD  05/07/2020  2:26 PM

## 2020-05-07 NOTE — Telephone Encounter (Signed)
Scheduled per 07/28 los, patient received updated Calender.

## 2020-05-07 NOTE — Progress Notes (Signed)
Provided patient with printed/verbal information on Zometa and Xeloda.

## 2020-05-07 NOTE — Telephone Encounter (Signed)
Oral Oncology Patient Advocate Encounter  Received notification from [facility] that prior authorization for Capecitabine is required.  PA submitted on CoverMyMeds Key [id number] Status is pending  Oral Oncology Clinic will continue to follow.  [place] Patient [place] Phone [phone] Fax [phone] 05/07/2020 3:09 PM

## 2020-05-07 NOTE — Patient Instructions (Signed)
Implanted Port Insertion, Care After °This sheet gives you information about how to care for yourself after your procedure. Your health care provider may also give you more specific instructions. If you have problems or questions, contact your health care provider. °What can I expect after the procedure? °After the procedure, it is common to have: °· Discomfort at the port insertion site. °· Bruising on the skin over the port. This should improve over 3-4 days. °Follow these instructions at home: °Port care °· After your port is placed, you will get a manufacturer's information card. The card has information about your port. Keep this card with you at all times. °· Take care of the port as told by your health care provider. Ask your health care provider if you or a family member can get training for taking care of the port at home. A home health care nurse may also take care of the port. °· Make sure to remember what type of port you have. °Incision care ° °  ° °· Follow instructions from your health care provider about how to take care of your port insertion site. Make sure you: °? Wash your hands with soap and water before and after you change your bandage (dressing). If soap and water are not available, use hand sanitizer. °? Change your dressing as told by your health care provider. °? Leave stitches (sutures), skin glue, or adhesive strips in place. These skin closures may need to stay in place for 2 weeks or longer. If adhesive strip edges start to loosen and curl up, you may trim the loose edges. Do not remove adhesive strips completely unless your health care provider tells you to do that. °· Check your port insertion site every day for signs of infection. Check for: °? Redness, swelling, or pain. °? Fluid or blood. °? Warmth. °? Pus or a bad smell. °Activity °· Return to your normal activities as told by your health care provider. Ask your health care provider what activities are safe for you. °· Do not  lift anything that is heavier than 10 lb (4.5 kg), or the limit that you are told, until your health care provider says that it is safe. °General instructions °· Take over-the-counter and prescription medicines only as told by your health care provider. °· Do not take baths, swim, or use a hot tub until your health care provider approves. Ask your health care provider if you may take showers. You may only be allowed to take sponge baths. °· Do not drive for 24 hours if you were given a sedative during your procedure. °· Wear a medical alert bracelet in case of an emergency. This will tell any health care providers that you have a port. °· Keep all follow-up visits as told by your health care provider. This is important. °Contact a health care provider if: °· You cannot flush your port with saline as directed, or you cannot draw blood from the port. °· You have a fever or chills. °· You have redness, swelling, or pain around your port insertion site. °· You have fluid or blood coming from your port insertion site. °· Your port insertion site feels warm to the touch. °· You have pus or a bad smell coming from the port insertion site. °Get help right away if: °· You have chest pain or shortness of breath. °· You have bleeding from your port that you cannot control. °Summary °· Take care of the port as told by your health   care provider. Keep the manufacturer's information card with you at all times. °· Change your dressing as told by your health care provider. °· Contact a health care provider if you have a fever or chills or if you have redness, swelling, or pain around your port insertion site. °· Keep all follow-up visits as told by your health care provider. °This information is not intended to replace advice given to you by your health care provider. Make sure you discuss any questions you have with your health care provider. °Document Revised: 04/25/2018 Document Reviewed: 04/25/2018 °Elsevier Patient Education ©  2020 Elsevier Inc. ° °

## 2020-05-08 ENCOUNTER — Encounter: Payer: Self-pay | Admitting: *Deleted

## 2020-05-08 MED ORDER — CAPECITABINE 500 MG PO TABS: ORAL_TABLET | ORAL | 0 refills | Status: DC

## 2020-05-08 NOTE — Telephone Encounter (Signed)
Oral Chemotherapy Pharmacist Encounter  I spoke with patient for overview of: Xeloda (capecitabine) for the treatment of ER/PR negative, HER-2 negative metastatic breast cancer, planned duration until disease progression or unacceptable drug toxicity.  Counseled patient on administration, dosing, side effects, monitoring, drug-food interactions, safe handling, storage, and disposal.  Patient will take Xeloda 522m tablets, 3 tablets (15016m by mouth in AM and 3 tabs (150031mby mouth in PM, within 30 minutes of finishing meals, on days 1-14 of each 21 day cycle.   Xeloda start date: 05/12/2020  Adverse effects include but are not limited to: fatigue, decreased blood counts, GI upset, diarrhea, mouth sores, and hand-foot syndrome.  Patient has anti-emetic on hand and knows to take it if nausea develops.   Patient will obtain anti diarrheal and alert the office of 4 or more loose stools above baseline.  Reviewed with patient importance of keeping a medication schedule and plan for any missed doses.  Medication reconciliation performed and medication/allergy list updated.  [facility]'s [facility] Patient informed of this and provided pharmacy's phone number.   Medication calendar and education sheets placed in mail for patient.   All questions answered.  Ms. SteTozziiced understanding and appreciation.   Patient knows to call the office with questions or concerns.  RebLeron CroakharmD, BCPS Hematology/Oncology Clinical Pharmacist [phone]/2021 4:07 PM

## 2020-05-08 NOTE — Progress Notes (Signed)
Faxed referral and chart information to [facility] [phone] to see DR. Hiram Comber or colleague.

## 2020-05-08 NOTE — Telephone Encounter (Signed)
Oral Oncology Patient Advocate Encounter  Prior Authorization for Capecitabine has been approved.    PA# [id number] Effective dates: 05/07/20 through 05/07/21  Patient must fill at [facility] will continue to follow.   [place] Patient [place] Phone [phone] Fax [phone] 05/08/2020 7:25 AM

## 2020-05-14 NOTE — Telephone Encounter (Signed)
Oral Chemotherapy Pharmacist Encounter   Spoke with patient today to follow up regarding patient's oral chemotherapy medication: Xeloda (capecitabine)  Patient informed me that she will be receiving it from [facility] today (05/14/2020) and will start medication 05/15/2020.  Reviewed with patient again administration, dosing, side effects, monitoring, drug-food interactions, safe handling, storage, and disposal of medication. As well as management of possible adverse effects that included: fatigue, decreased blood counts, GI upset, diarrhea, mouth sores, and hand-foot syndrome  Patient knows to call the office with questions or concerns.  Leron Croak, PharmD, BCPS Hematology/Oncology Clinical Pharmacist [facility] [phone] 05/14/2020 3:02 PM

## 2020-05-25 ENCOUNTER — Other Ambulatory Visit: Payer: Self-pay | Admitting: Oncology

## 2020-05-28 ENCOUNTER — Inpatient Hospital Stay: Payer: 59 | Admitting: Nurse Practitioner

## 2020-05-28 ENCOUNTER — Telehealth: Payer: Self-pay | Admitting: Nurse Practitioner

## 2020-05-28 ENCOUNTER — Inpatient Hospital Stay: Payer: 59

## 2020-05-28 ENCOUNTER — Other Ambulatory Visit: Payer: Self-pay | Admitting: *Deleted

## 2020-05-28 ENCOUNTER — Other Ambulatory Visit: Payer: Self-pay

## 2020-05-28 ENCOUNTER — Encounter: Payer: Self-pay | Admitting: Nurse Practitioner

## 2020-05-28 ENCOUNTER — Inpatient Hospital Stay: Payer: 59 | Attending: Oncology

## 2020-05-28 VITALS — BP 120/77 | HR 77 | Temp 97.8°F | Resp 18 | Ht 61.0 in | Wt 145.6 lb

## 2020-05-28 DIAGNOSIS — C50412 Malignant neoplasm of upper-outer quadrant of left female breast: Secondary | ICD-10-CM

## 2020-05-28 DIAGNOSIS — C7951 Secondary malignant neoplasm of bone: Secondary | ICD-10-CM | POA: Insufficient documentation

## 2020-05-28 DIAGNOSIS — Z95828 Presence of other vascular implants and grafts: Secondary | ICD-10-CM

## 2020-05-28 DIAGNOSIS — Z171 Estrogen receptor negative status [ER-]: Secondary | ICD-10-CM

## 2020-05-28 DIAGNOSIS — C787 Secondary malignant neoplasm of liver and intrahepatic bile duct: Secondary | ICD-10-CM | POA: Insufficient documentation

## 2020-05-28 DIAGNOSIS — C773 Secondary and unspecified malignant neoplasm of axilla and upper limb lymph nodes: Secondary | ICD-10-CM | POA: Insufficient documentation

## 2020-05-28 DIAGNOSIS — C50912 Malignant neoplasm of unspecified site of left female breast: Secondary | ICD-10-CM | POA: Diagnosis not present

## 2020-05-28 LAB — CBC WITH DIFFERENTIAL (CANCER CENTER ONLY)
Abs Immature Granulocytes: 0.01 10*3/uL (ref 0.00–0.07)
Basophils Absolute: 0 10*3/uL (ref 0.0–0.1)
Basophils Relative: 0 %
Eosinophils Absolute: 0.1 10*3/uL (ref 0.0–0.5)
Eosinophils Relative: 3 %
HCT: 32.8 % — ABNORMAL LOW (ref 36.0–46.0)
Hemoglobin: 10 g/dL — ABNORMAL LOW (ref 12.0–15.0)
Immature Granulocytes: 0 %
Lymphocytes Relative: 17 %
Lymphs Abs: 0.6 10*3/uL — ABNORMAL LOW (ref 0.7–4.0)
MCH: 28.7 pg (ref 26.0–34.0)
MCHC: 30.5 g/dL (ref 30.0–36.0)
MCV: 94 fL (ref 80.0–100.0)
Monocytes Absolute: 0.5 10*3/uL (ref 0.1–1.0)
Monocytes Relative: 14 %
Neutro Abs: 2.3 10*3/uL (ref 1.7–7.7)
Neutrophils Relative %: 66 %
Platelet Count: 264 10*3/uL (ref 150–400)
RBC: 3.49 MIL/uL — ABNORMAL LOW (ref 3.87–5.11)
RDW: 16.7 % — ABNORMAL HIGH (ref 11.5–15.5)
WBC Count: 3.4 10*3/uL — ABNORMAL LOW (ref 4.0–10.5)
nRBC: 0 % (ref 0.0–0.2)

## 2020-05-28 LAB — CMP (CANCER CENTER ONLY)
ALT: 18 U/L (ref 0–44)
AST: 21 U/L (ref 15–41)
Albumin: 3.9 g/dL (ref 3.5–5.0)
Alkaline Phosphatase: 173 U/L — ABNORMAL HIGH (ref 38–126)
Anion gap: 9 (ref 5–15)
BUN: 14 mg/dL (ref 6–20)
CO2: 28 mmol/L (ref 22–32)
Calcium: 9.9 mg/dL (ref 8.9–10.3)
Chloride: 105 mmol/L (ref 98–111)
Creatinine: 0.73 mg/dL (ref 0.44–1.00)
GFR, Est AFR Am: >60 mL/min (ref >60)
GFR, Estimated: >60 mL/min (ref >60)
Glucose, Bld: 102 mg/dL — ABNORMAL HIGH (ref 70–99)
Potassium: 3.9 mmol/L (ref 3.5–5.1)
Sodium: 142 mmol/L (ref 135–145)
Total Bilirubin: 0.3 mg/dL (ref 0.3–1.2)
Total Protein: 7.1 g/dL (ref 6.5–8.1)

## 2020-05-28 MED ORDER — CAPECITABINE 500 MG PO TABS: ORAL_TABLET | ORAL | 0 refills | Status: DC

## 2020-05-28 MED ORDER — SODIUM CHLORIDE 0.9% FLUSH
10.0000 mL | Freq: Once | INTRAVENOUS | Status: AC | PRN
  Administered 2020-05-28: 10 mL
  Filled 2020-05-28: qty 10

## 2020-05-28 NOTE — Progress Notes (Signed)
[place] OFFICE PROGRESS NOTE   Diagnosis: Breast cancer  INTERVAL HISTORY:   Abigail [name] returns as scheduled.  She began cycle 1 Xeloda 05/15/2020.  She denies nausea/vomiting.  No mouth sores.  No diarrhea.  No hand or foot pain or redness.  She has noticed some darkening of the skin on her hands.  No fever, cough, shortness of breath.  She has decided against the second opinion at [facility].  Objective:  Vital signs in last 24 hours:  Blood pressure 120/77, pulse 77, temperature 97.8 F (36.6 C), temperature source Tympanic, resp. rate 18, height $RemoveBe'5\' 1"'EkPpKjoKp$  (1.549 m), weight 145 lb 9.6 oz (66 kg), SpO2 100 %.    HEENT: No thrush or ulcers. Resp: Lungs clear bilaterally. Cardio: Regular rate and rhythm. GI: Abdomen soft and nontender.  No hepatomegaly. Vascular: No leg edema. Neuro: Alert and oriented. Skin: Tiny cutaneous nodules left upper to lateral anterior chest. Port-A-Cath without erythema.   Lab Results:  Lab Results  Component Value Date   WBC 3.4 (L) 05/28/2020   HGB 10.0 (L) 05/28/2020   HCT 32.8 (L) 05/28/2020   MCV 94.0 05/28/2020   PLT 264 05/28/2020   NEUTROABS 2.3 05/28/2020    Imaging:  No results found.  Medications: I have reviewed the patient's current medications.  Assessment/Plan: 1.Metastatic breast cancer  CT chest 10/25/2019-left breast mass left axillary lymph nodes, abnormal appearance of the left concerning for tumor involvement, mediastinal adenopathy, diffuse sclerotic and lytic lesions, left pleural effusion  CT abdomen/pelvis 10/26/2019 enhancing left breast mass with left axillary lymphadenopathy and tumor involved the left chest wall the latissimus dorsi segment 4B liver lesion, diffuse lytic/sclerotic lesions  Ultrasound-guided biopsy of left liver lesion 10/29/1999-metastatic carcinoma, cytokeratin andGATA3positive consistent with metastatic breast cancer. HER-2 negative, ER negative, PR negative, Ki-6720%. PD-L1 <1%  expression   Invitae hereditary panel negative  Initiated systemic chemotherapy weekly abraxane 100 mg/m2 days 1, 8, 15 q28 days   Cycle 1 day 1 on 11/14/19  Cycle 1 day 8 on 11/21/19 (hg 7.5, receive 1 unit RBCs)  Cycle 1 day 15 on 11/28/2019  Cycle 1 Adriamycin/Cytoxan 12/12/2019  Cycle 2 Adriamycin/Cytoxan 01/02/2020  Cycle 3 Adriamycin/Cytoxan 01/23/2020  Cycle 4 Adriamycin/Cytoxan 02/13/2020  Cycle 5 Adriamycin/Cytoxan 03/05/2020  Cycle 6 Adriamycin/Cytoxan 03/26/2020  Restaging chest CT 03/24/2020-residual soft tissue lesion in the lateral left breast, partially imaged.  Associated with improving soft tissue thickening, nodularity and stranding in the left axilla without a discrete measurable lesion.  Resolved left pleural effusion with left chest tube in place.  2 mm right upper lobe nodule decreased in size from 10/25/2019.  Diffuse osseous metastatic disease.  Cycle 7 Adriamycin/Cytoxan 04/16/2020  Cycle 1 Xeloda 05/15/2020  Cycle 2 Xeloda 06/05/2020 2. Left upper extremity DVT-left subclavian, axillary, and brachial vein thrombosis confirmed on  Anticoagulation with apixaban, changed to parenteral hospital admission1/14/2021transition to twice daily Lovenox at discharge; transition to once daily 11/08/2019.  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 Prophylactic ciprofloxacin initiated. [facility]  admission 12/19/2019-acute respiratory failure with hypoxia, large left pleural effusion, ? HCAP, pancytopenia    Disposition: Abigail [name] appears stable.  She began cycle 1 Xeloda 05/15/2020.  She seems to be tolerating well.  She will begin cycle 2 on 06/05/2020.  She is scheduled for Zometa today.  Dental appointment is on 06/20/2020.  We will cancel Zometa today and reschedule with her next appointment here.  We reviewed the CBC from today.  Counts adequate to continue with Xeloda as above.  She has decided against the second opinion at [facility]  We recommended the Covid vaccine.  She has not decided if she will take the vaccine.  She will return for lab, follow-up, Zometa on 06/24/2020.  She will contact the office in the interim with any problems.  Plan reviewed with Dr. Benay Spice.    Ned Card ANP/GNP-BC   05/28/2020  9:53 AM

## 2020-05-28 NOTE — Telephone Encounter (Signed)
Scheduled appointments per 8/18 los. Left message for patient with appointment date and time.

## 2020-05-28 NOTE — Patient Instructions (Signed)

## 2020-05-29 LAB — CANCER ANTIGEN 27.29: CA 27.29: 51.5 U/mL — ABNORMAL HIGH (ref 0.0–38.6)

## 2020-06-19 ENCOUNTER — Other Ambulatory Visit: Payer: Self-pay | Admitting: *Deleted

## 2020-06-19 DIAGNOSIS — C50412 Malignant neoplasm of upper-outer quadrant of left female breast: Secondary | ICD-10-CM

## 2020-06-19 MED ORDER — CAPECITABINE 500 MG PO TABS: ORAL_TABLET | ORAL | 0 refills | Status: DC

## 2020-06-24 ENCOUNTER — Inpatient Hospital Stay: Payer: 59 | Attending: Oncology

## 2020-06-24 ENCOUNTER — Other Ambulatory Visit: Payer: Self-pay

## 2020-06-24 ENCOUNTER — Inpatient Hospital Stay: Payer: 59

## 2020-06-24 ENCOUNTER — Inpatient Hospital Stay: Payer: 59 | Admitting: Oncology

## 2020-06-24 VITALS — BP 124/84 | HR 79 | Temp 98.0°F | Resp 18 | Ht 61.0 in | Wt 145.7 lb

## 2020-06-24 DIAGNOSIS — Z171 Estrogen receptor negative status [ER-]: Secondary | ICD-10-CM

## 2020-06-24 DIAGNOSIS — C7951 Secondary malignant neoplasm of bone: Secondary | ICD-10-CM | POA: Insufficient documentation

## 2020-06-24 DIAGNOSIS — Z23 Encounter for immunization: Secondary | ICD-10-CM | POA: Insufficient documentation

## 2020-06-24 DIAGNOSIS — C50412 Malignant neoplasm of upper-outer quadrant of left female breast: Secondary | ICD-10-CM

## 2020-06-24 DIAGNOSIS — L819 Disorder of pigmentation, unspecified: Secondary | ICD-10-CM | POA: Diagnosis not present

## 2020-06-24 DIAGNOSIS — C787 Secondary malignant neoplasm of liver and intrahepatic bile duct: Secondary | ICD-10-CM | POA: Insufficient documentation

## 2020-06-24 DIAGNOSIS — C50912 Malignant neoplasm of unspecified site of left female breast: Secondary | ICD-10-CM | POA: Diagnosis not present

## 2020-06-24 DIAGNOSIS — C773 Secondary and unspecified malignant neoplasm of axilla and upper limb lymph nodes: Secondary | ICD-10-CM | POA: Diagnosis not present

## 2020-06-24 DIAGNOSIS — Z95828 Presence of other vascular implants and grafts: Secondary | ICD-10-CM

## 2020-06-24 LAB — CMP (CANCER CENTER ONLY)
ALT: 16 U/L (ref 0–44)
AST: 21 U/L (ref 15–41)
Albumin: 4 g/dL (ref 3.5–5.0)
Alkaline Phosphatase: 164 U/L — ABNORMAL HIGH (ref 38–126)
Anion gap: 6 (ref 5–15)
BUN: 10 mg/dL (ref 6–20)
CO2: 30 mmol/L (ref 22–32)
Calcium: 9.7 mg/dL (ref 8.9–10.3)
Chloride: 105 mmol/L (ref 98–111)
Creatinine: 0.76 mg/dL (ref 0.44–1.00)
GFR, Est AFR Am: >60 mL/min (ref >60)
GFR, Estimated: >60 mL/min (ref >60)
Glucose, Bld: 117 mg/dL — ABNORMAL HIGH (ref 70–99)
Potassium: 3.9 mmol/L (ref 3.5–5.1)
Sodium: 141 mmol/L (ref 135–145)
Total Bilirubin: 0.3 mg/dL (ref 0.3–1.2)
Total Protein: 7.3 g/dL (ref 6.5–8.1)

## 2020-06-24 LAB — CBC WITH DIFFERENTIAL (CANCER CENTER ONLY)
Abs Immature Granulocytes: 0.01 10*3/uL (ref 0.00–0.07)
Basophils Absolute: 0 10*3/uL (ref 0.0–0.1)
Basophils Relative: 0 %
Eosinophils Absolute: 0 10*3/uL (ref 0.0–0.5)
Eosinophils Relative: 1 %
HCT: 34.4 % — ABNORMAL LOW (ref 36.0–46.0)
Hemoglobin: 10.8 g/dL — ABNORMAL LOW (ref 12.0–15.0)
Immature Granulocytes: 0 %
Lymphocytes Relative: 13 %
Lymphs Abs: 0.6 10*3/uL — ABNORMAL LOW (ref 0.7–4.0)
MCH: 29.9 pg (ref 26.0–34.0)
MCHC: 31.4 g/dL (ref 30.0–36.0)
MCV: 95.3 fL (ref 80.0–100.0)
Monocytes Absolute: 0.4 10*3/uL (ref 0.1–1.0)
Monocytes Relative: 9 %
Neutro Abs: 3.6 10*3/uL (ref 1.7–7.7)
Neutrophils Relative %: 77 %
Platelet Count: 219 10*3/uL (ref 150–400)
RBC: 3.61 MIL/uL — ABNORMAL LOW (ref 3.87–5.11)
RDW: 17.7 % — ABNORMAL HIGH (ref 11.5–15.5)
WBC Count: 4.7 10*3/uL (ref 4.0–10.5)
nRBC: 0 % (ref 0.0–0.2)

## 2020-06-24 MED ORDER — SODIUM CHLORIDE 0.9% FLUSH
10.0000 mL | Freq: Once | INTRAVENOUS | Status: AC
  Administered 2020-06-24: 10 mL
  Filled 2020-06-24: qty 10

## 2020-06-24 MED ORDER — SODIUM CHLORIDE 0.9% FLUSH
10.0000 mL | INTRAVENOUS | Status: DC | PRN
  Administered 2020-06-24: 10 mL via INTRAVENOUS
  Filled 2020-06-24: qty 10

## 2020-06-24 MED ORDER — HEPARIN SOD (PORK) LOCK FLUSH 100 UNIT/ML IV SOLN
500.0000 [IU] | Freq: Once | INTRAVENOUS | Status: AC
  Administered 2020-06-24: 500 [IU] via INTRAVENOUS
  Filled 2020-06-24: qty 5

## 2020-06-24 NOTE — Progress Notes (Signed)
[name] OFFICE PROGRESS NOTE   Diagnosis: Breast cancer  INTERVAL HISTORY:   Abigail [name] returns as scheduled.  She completed cycle 2 Xeloda beginning 06/05/2020.  She has developed hyperpigmentation of the hands and feet.  No mouth sores or diarrhea.  No pain.  She has returned to work.  Objective:  Vital signs in last 24 hours:  Blood pressure 124/84, pulse 79, temperature 98 F (36.7 C), temperature source Tympanic, resp. rate 18, height '5\' 1"'  (1.549 m), weight 145 lb 11.2 oz (66.1 kg), SpO2 100 %.    HEENT: No thrush or ulcers Lymphatics: No supraclavicular or axillary nodes Resp: Lungs clear bilaterally Cardio: Regular rate and rhythm GI: No hepatosplenomegaly Vascular: No leg edema Skin: Hyperpigmentation and dryness of the palms and soles, no skin breakdown, firm less than 1 cm cutaneous nodular lesions at the left anterior chest, pectoral line, and posterior axilla/upper back Breast: Firm masslike fullness in the lateral aspect of the left breast  Portacath/PICC-without erythema  Lab Results:  Lab Results  Component Value Date   WBC 4.7 06/24/2020   HGB 10.8 (L) 06/24/2020   HCT 34.4 (L) 06/24/2020   MCV 95.3 06/24/2020   PLT 219 06/24/2020   NEUTROABS 3.6 06/24/2020    CMP  Lab Results  Component Value Date   NA 141 06/24/2020   K 3.9 06/24/2020   CL 105 06/24/2020   CO2 30 06/24/2020   GLUCOSE 117 (H) 06/24/2020   BUN 10 06/24/2020   CREATININE 0.76 06/24/2020   CALCIUM 9.7 06/24/2020   PROT 7.3 06/24/2020   ALBUMIN 4.0 06/24/2020   AST 21 06/24/2020   ALT 16 06/24/2020   ALKPHOS 164 (H) 06/24/2020   BILITOT 0.3 06/24/2020   GFRNONAA >60 06/24/2020   GFRAA >60 06/24/2020     Medications: I have reviewed the patient's current medications.   Assessment/Plan: .Metastatic breast cancer  CT chest 10/25/2019-left breast mass left axillary lymph nodes, abnormal appearance of the left concerning for tumor involvement,  mediastinal adenopathy, diffuse sclerotic and lytic lesions, left pleural effusion  CT abdomen/pelvis 10/26/2019 enhancing left breast mass with left axillary lymphadenopathy and tumor involved the left chest wall the latissimus dorsi segment 4B liver lesion, diffuse lytic/sclerotic lesions  Ultrasound-guided biopsy of left liver lesion 10/29/1999-metastatic carcinoma, cytokeratin andGATA3positive consistent with metastatic breast cancer. HER-2 negative, ER negative, PR negative, Ki-6720%. PD-L1 <1% expression   Invitae hereditary panel negative  Initiated systemic chemotherapy weekly abraxane 100 mg/m2 days 1, 8, 15 q28 days   Cycle 1 day 1 on 11/14/19  Cycle 1 day 8 on 11/21/19 (hg 7.5, receive 1 unit RBCs)  Cycle 1 day 15 on 11/28/2019  Cycle 1 Adriamycin/Cytoxan 12/12/2019  Cycle 2 Adriamycin/Cytoxan 01/02/2020  Cycle 3 Adriamycin/Cytoxan 01/23/2020  Cycle 4 Adriamycin/Cytoxan 02/13/2020  Cycle 5 Adriamycin/Cytoxan 03/05/2020  Cycle 6 Adriamycin/Cytoxan 03/26/2020  Restaging chest CT 03/24/2020-residual soft tissue lesion in the lateral left breast, partially imaged.  Associated with improving soft tissue thickening, nodularity and stranding in the left axilla without a discrete measurable lesion.  Resolved left pleural effusion with left chest tube in place.  2 mm right upper lobe nodule decreased in size from 10/25/2019.  Diffuse osseous metastatic disease.  Cycle 7 Adriamycin/Cytoxan 04/16/2020  Cycle 1 Xeloda 05/15/2020  Cycle 2 Xeloda 06/05/2020  Cycle 3 Xeloda 06/26/2020 2. Left upper extremity DVT-left subclavian, axillary, and brachial vein thrombosis confirmed on  Anticoagulation with apixaban, changed to parenteral hospital admission1/14/2021transition to twice daily Lovenox at discharge; transition to  once daily 11/08/2019.  Lovenox discontinued and rivaroxaban anticoagulation started 01/15/2020 3.Anemia-likely secondary to metastatic breast cancer involving the bone  marrow, stable 01/23/2020 4.Left pleural effusion-thoracentesis 11/09/2019, cytology positive for malignant cells;Pleurx catheter placed 12/20/2019 5.Port-A-Cath placement1/27/2021, interventional radiology 6.2D echo 11/08/2019-ejection fraction 55 to 60%, no pericardial effusion, echocardiogram 04/07/2020-LVEF 60-65% 7.Tachycardia-likely multifactorial secondary to cancer burden, anemia, pleural effusion-improved 8.Neutropenia secondary to chemotherapy 12/19/2019. Prophylactic ciprofloxacin initiated. [facility] admission 12/19/2019-acute respiratory failure with hypoxia, large left pleural effusion, ? HCAP, pancytopenia     Disposition: Abigail [name] appears unchanged.  She has completed 2 cycles of Xeloda.  She has mild hyperpigmentation secondary to Xeloda.  The cutaneous lesions at the left breast and chest wall appear slightly progressive.  The plan is to complete another cycle of Xeloda beginning 06/26/2020.  She will return for an office visit in 3 weeks.  I recommended she obtain the COVID-19 vaccine.  She agrees to a COVID-19 vaccine on 07/04/2020.  She will begin Zometa when she is here on 07/11/2020.  Betsy Coder, MD  06/24/2020  10:32 AM

## 2020-06-24 NOTE — Progress Notes (Signed)
Undecided on COVID vaccine. Declined flu vaccine today. Provided printed information on COVID vaccine to review.

## 2020-06-24 NOTE — Patient Instructions (Signed)

## 2020-06-25 ENCOUNTER — Telehealth: Payer: Self-pay | Admitting: Oncology

## 2020-06-25 LAB — CANCER ANTIGEN 27.29: CA 27.29: 55.9 U/mL — ABNORMAL HIGH (ref 0.0–38.6)

## 2020-06-25 NOTE — Telephone Encounter (Signed)
Scheduled appointments per 9/14 los. Patient is aware of all upcoming appointments.

## 2020-06-30 ENCOUNTER — Telehealth: Payer: Self-pay | Admitting: Nurse Practitioner

## 2020-06-30 NOTE — Telephone Encounter (Signed)
R/s f/u and labs for 10/1 appt per sch msg. Called and spoke with patient. Confirmed time change in appt

## 2020-07-02 ENCOUNTER — Other Ambulatory Visit: Payer: Self-pay | Admitting: *Deleted

## 2020-07-02 DIAGNOSIS — Z171 Estrogen receptor negative status [ER-]: Secondary | ICD-10-CM

## 2020-07-02 MED ORDER — CAPECITABINE 500 MG PO TABS: ORAL_TABLET | ORAL | 0 refills | Status: DC

## 2020-07-02 NOTE — Progress Notes (Signed)
Received faxed request for refill on capecitabine from [facility].

## 2020-07-04 ENCOUNTER — Ambulatory Visit: Payer: 59

## 2020-07-04 ENCOUNTER — Other Ambulatory Visit: Payer: Self-pay

## 2020-07-04 ENCOUNTER — Inpatient Hospital Stay: Payer: 59

## 2020-07-04 DIAGNOSIS — Z23 Encounter for immunization: Secondary | ICD-10-CM

## 2020-07-04 NOTE — Progress Notes (Signed)
   Covid-19 Vaccination Clinic  Name:  Abigail [name]    MRN: [id number] DOB: 13-Apr-1966  07/04/2020  Abigail [name] was observed post Covid-19 immunization for 15 minutes without incident. She was provided with Vaccine Information Sheet and instruction to access the V-Safe system.   Abigail [name] was instructed to call 911 with any severe reactions post vaccine: Marland Kitchen Difficulty breathing  . Swelling of face and throat  . A fast heartbeat  . A bad rash all over body  . Dizziness and weakness   Immunizations Administered    Name Date Dose VIS Date Route   Pfizer COVID-19 Vaccine 07/04/2020 10:18 AM 0.3 mL 12/05/2018 Intramuscular   Manufacturer: [place]   Lot: [id number]   [place]: [id number]

## 2020-07-11 ENCOUNTER — Ambulatory Visit: Payer: 59 | Admitting: Nurse Practitioner

## 2020-07-11 ENCOUNTER — Inpatient Hospital Stay: Payer: 59

## 2020-07-11 ENCOUNTER — Inpatient Hospital Stay (HOSPITAL_BASED_OUTPATIENT_CLINIC_OR_DEPARTMENT_OTHER): Payer: 59 | Admitting: Oncology

## 2020-07-11 ENCOUNTER — Other Ambulatory Visit: Payer: 59

## 2020-07-11 ENCOUNTER — Inpatient Hospital Stay: Payer: 59 | Attending: Oncology

## 2020-07-11 ENCOUNTER — Other Ambulatory Visit: Payer: Self-pay

## 2020-07-11 VITALS — BP 110/82 | HR 89 | Temp 97.9°F | Resp 18 | Ht 61.0 in | Wt 144.1 lb

## 2020-07-11 DIAGNOSIS — C7951 Secondary malignant neoplasm of bone: Secondary | ICD-10-CM | POA: Diagnosis present

## 2020-07-11 DIAGNOSIS — R229 Localized swelling, mass and lump, unspecified: Secondary | ICD-10-CM | POA: Insufficient documentation

## 2020-07-11 DIAGNOSIS — C50412 Malignant neoplasm of upper-outer quadrant of left female breast: Secondary | ICD-10-CM

## 2020-07-11 DIAGNOSIS — C50912 Malignant neoplasm of unspecified site of left female breast: Secondary | ICD-10-CM | POA: Insufficient documentation

## 2020-07-11 DIAGNOSIS — Z171 Estrogen receptor negative status [ER-]: Secondary | ICD-10-CM | POA: Diagnosis not present

## 2020-07-11 DIAGNOSIS — M25552 Pain in left hip: Secondary | ICD-10-CM | POA: Insufficient documentation

## 2020-07-11 DIAGNOSIS — M25551 Pain in right hip: Secondary | ICD-10-CM | POA: Diagnosis not present

## 2020-07-11 DIAGNOSIS — Z23 Encounter for immunization: Secondary | ICD-10-CM | POA: Diagnosis not present

## 2020-07-11 DIAGNOSIS — C787 Secondary malignant neoplasm of liver and intrahepatic bile duct: Secondary | ICD-10-CM | POA: Diagnosis not present

## 2020-07-11 LAB — CBC WITH DIFFERENTIAL (CANCER CENTER ONLY)
Abs Immature Granulocytes: 0.01 10*3/uL (ref 0.00–0.07)
Basophils Absolute: 0 10*3/uL (ref 0.0–0.1)
Basophils Relative: 0 %
Eosinophils Absolute: 0.1 10*3/uL (ref 0.0–0.5)
Eosinophils Relative: 3 %
HCT: 33.1 % — ABNORMAL LOW (ref 36.0–46.0)
Hemoglobin: 10.4 g/dL — ABNORMAL LOW (ref 12.0–15.0)
Immature Granulocytes: 0 %
Lymphocytes Relative: 15 %
Lymphs Abs: 0.6 10*3/uL — ABNORMAL LOW (ref 0.7–4.0)
MCH: 29.8 pg (ref 26.0–34.0)
MCHC: 31.4 g/dL (ref 30.0–36.0)
MCV: 94.8 fL (ref 80.0–100.0)
Monocytes Absolute: 0.4 10*3/uL (ref 0.1–1.0)
Monocytes Relative: 12 %
Neutro Abs: 2.6 10*3/uL (ref 1.7–7.7)
Neutrophils Relative %: 70 %
Platelet Count: 267 10*3/uL (ref 150–400)
RBC: 3.49 MIL/uL — ABNORMAL LOW (ref 3.87–5.11)
RDW: 18 % — ABNORMAL HIGH (ref 11.5–15.5)
WBC Count: 3.8 10*3/uL — ABNORMAL LOW (ref 4.0–10.5)
nRBC: 0 % (ref 0.0–0.2)

## 2020-07-11 LAB — CMP (CANCER CENTER ONLY)
ALT: 20 U/L (ref 0–44)
AST: 22 U/L (ref 15–41)
Albumin: 3.9 g/dL (ref 3.5–5.0)
Alkaline Phosphatase: 137 U/L — ABNORMAL HIGH (ref 38–126)
Anion gap: 11 (ref 5–15)
BUN: 11 mg/dL (ref 6–20)
CO2: 25 mmol/L (ref 22–32)
Calcium: 9.9 mg/dL (ref 8.9–10.3)
Chloride: 104 mmol/L (ref 98–111)
Creatinine: 0.8 mg/dL (ref 0.44–1.00)
GFR, Est AFR Am: >60 mL/min (ref >60)
GFR, Estimated: >60 mL/min (ref >60)
Glucose, Bld: 103 mg/dL — ABNORMAL HIGH (ref 70–99)
Potassium: 3.7 mmol/L (ref 3.5–5.1)
Sodium: 140 mmol/L (ref 135–145)
Total Bilirubin: 0.4 mg/dL (ref 0.3–1.2)
Total Protein: 7.4 g/dL (ref 6.5–8.1)

## 2020-07-11 MED ORDER — HEPARIN SOD (PORK) LOCK FLUSH 100 UNIT/ML IV SOLN
500.0000 [IU] | Freq: Once | INTRAVENOUS | Status: AC
  Administered 2020-07-11: 500 [IU] via INTRAVENOUS
  Filled 2020-07-11: qty 5

## 2020-07-11 MED ORDER — SODIUM CHLORIDE 0.9% FLUSH
10.0000 mL | INTRAVENOUS | Status: DC | PRN
  Administered 2020-07-11: 10 mL via INTRAVENOUS
  Filled 2020-07-11: qty 10

## 2020-07-11 NOTE — Patient Instructions (Signed)

## 2020-07-11 NOTE — Progress Notes (Signed)
[facility] OFFICE PROGRESS NOTE   Diagnosis: Breast cancer  INTERVAL HISTORY:   Abigail [name] completed another cycle of Xeloda beginning 06/26/2020.  No diarrhea or hand/foot pain.  She has developed hyperpigmentation and dryness of the hands and feet.  She has noted to "nodules "at the left back and persistent nodules at the left anterolateral chest.  She has intermittent back pain.  Good appetite.  No bleeding. She received the first Covid vaccine on 07/04/2020. Objective:  Vital signs in last 24 hours:  Blood pressure 110/82, pulse 89, temperature 97.9 F (36.6 C), temperature source Tympanic, resp. rate 18, height '5\' 1"'  (1.549 m), weight 144 lb 1.6 oz (65.4 kg), SpO2 96 %.    Lymphatics: No cervical, supraclavicular, or left axillary nodes Resp: Lungs clear bilaterally Cardio: Regular rate and rhythm GI: No hepatomegaly Vascular: No leg edema Breast: Firm mass in the upper outer left breast Skin: 2 cutaneous nodules at the posterior left axilla, similar nodule at the lateral left upper back, at least 2 nodules at the anterior lateral chest and a nodule in the deep left axilla  Portacath/PICC-without erythema  Lab Results:  Lab Results  Component Value Date   WBC 3.8 (L) 07/11/2020   HGB 10.4 (L) 07/11/2020   HCT 33.1 (L) 07/11/2020   MCV 94.8 07/11/2020   PLT 267 07/11/2020   NEUTROABS 2.6 07/11/2020    CMP  Lab Results  Component Value Date   NA 141 06/24/2020   K 3.9 06/24/2020   CL 105 06/24/2020   CO2 30 06/24/2020   GLUCOSE 117 (H) 06/24/2020   BUN 10 06/24/2020   CREATININE 0.76 06/24/2020   CALCIUM 9.7 06/24/2020   PROT 7.3 06/24/2020   ALBUMIN 4.0 06/24/2020   AST 21 06/24/2020   ALT 16 06/24/2020   ALKPHOS 164 (H) 06/24/2020   BILITOT 0.3 06/24/2020   GFRNONAA >60 06/24/2020   GFRAA >60 06/24/2020    Medications: I have reviewed the patient's current medications.   Assessment/Plan: 1. Metastatic breast cancer  CT chest  10/25/2019-left breast mass left axillary lymph nodes, abnormal appearance of the left concerning for tumor involvement, mediastinal adenopathy, diffuse sclerotic and lytic lesions, left pleural effusion  CT abdomen/pelvis 10/26/2019 enhancing left breast mass with left axillary lymphadenopathy and tumor involved the left chest wall the latissimus dorsi segment 4B liver lesion, diffuse lytic/sclerotic lesions  Ultrasound-guided biopsy of left liver lesion 10/29/1999-metastatic carcinoma, cytokeratin andGATA3positive consistent with metastatic breast cancer. HER-2 negative, ER negative, PR negative, Ki-6720%. PD-L1 <1% expression   Invitae hereditary panel negative  Initiated systemic chemotherapy weekly abraxane 100 mg/m2 days 1, 8, 15 q28 days   Cycle 1 day 1 on 11/14/19  Cycle 1 day 8 on 11/21/19 (hg 7.5, receive 1 unit RBCs)  Cycle 1 day 15 on 11/28/2019  Cycle 1 Adriamycin/Cytoxan 12/12/2019  Cycle 2 Adriamycin/Cytoxan 01/02/2020  Cycle 3 Adriamycin/Cytoxan 01/23/2020  Cycle 4 Adriamycin/Cytoxan 02/13/2020  Cycle 5 Adriamycin/Cytoxan 03/05/2020  Cycle 6 Adriamycin/Cytoxan 03/26/2020  Restaging chest CT 03/24/2020-residual soft tissue lesion in the lateral left breast, partially imaged.  Associated with improving soft tissue thickening, nodularity and stranding in the left axilla without a discrete measurable lesion.  Resolved left pleural effusion with left chest tube in place.  2 mm right upper lobe nodule decreased in size from 10/25/2019.  Diffuse osseous metastatic disease.  Cycle 7 Adriamycin/Cytoxan 04/16/2020  Cycle 1 Xeloda 05/15/2020  Cycle 2 Xeloda 06/05/2020  Cycle 3 Xeloda 06/26/2020  Cycle 4 Xeloda 07/18/2019 2.  Left upper extremity DVT-left subclavian, axillary, and brachial vein thrombosis confirmed on  Anticoagulation with apixaban, changed to parenteral hospital admission1/14/2021transition to twice daily Lovenox at discharge; transition to once daily  11/08/2019.  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 Prophylactic ciprofloxacin initiated. [facility] admission 12/19/2019-acute respiratory failure with hypoxia, large left pleural effusion, ? HCAP, pancytopenia      Disposition: Abigail [name] is tolerating the Xeloda well.  She will begin cycle 4 on 07/17/2020.  There is a persistent palpable mass in the left breast.  The nodules at the left anterolateral and posterior chest wall appear slightly increased in size today.  We will follow up on the CA 27-29 from today.  Abigail [name] will return for an office visit on 08/05/2020.  We refer her for a restaging CT evaluation and  make a change to a different systemic therapy if the skin nodules have increased in size.    Betsy Coder, MD  07/11/2020  9:08 AM

## 2020-07-12 LAB — CANCER ANTIGEN 27.29: CA 27.29: 48.4 U/mL — ABNORMAL HIGH (ref 0.0–38.6)

## 2020-07-22 ENCOUNTER — Other Ambulatory Visit: Payer: Self-pay | Admitting: *Deleted

## 2020-07-22 DIAGNOSIS — C50412 Malignant neoplasm of upper-outer quadrant of left female breast: Secondary | ICD-10-CM

## 2020-07-22 MED ORDER — CAPECITABINE 500 MG PO TABS: ORAL_TABLET | ORAL | 0 refills | Status: DC

## 2020-07-25 ENCOUNTER — Other Ambulatory Visit: Payer: Self-pay

## 2020-07-25 ENCOUNTER — Inpatient Hospital Stay: Payer: 59

## 2020-07-25 DIAGNOSIS — C50912 Malignant neoplasm of unspecified site of left female breast: Secondary | ICD-10-CM | POA: Diagnosis not present

## 2020-07-25 DIAGNOSIS — Z23 Encounter for immunization: Secondary | ICD-10-CM

## 2020-07-25 NOTE — Progress Notes (Signed)
   Covid-19 Vaccination Clinic  Name:  Abigail [name]    MRN: [id number] DOB: [birth year]  07/25/2020  [name] was observed post Covid-19 immunization for 15 minutes without incident. She was provided with Vaccine Information Sheet and instruction to access the V-Safe system.   [name] was instructed to call 911 with any severe reactions post vaccine: Marland Kitchen Difficulty breathing  . Swelling of face and throat  . A fast heartbeat  . A bad rash all over body  . Dizziness and weakness   Immunizations Administered    Name Date Dose VIS Date Route   Pfizer COVID-19 Vaccine 07/25/2020 10:08 AM 0.3 mL 12/05/2018 Intramuscular   Manufacturer: [place]   Lot: [id number]   [place]: [id number]

## 2020-07-29 ENCOUNTER — Other Ambulatory Visit: Payer: Self-pay | Admitting: *Deleted

## 2020-07-29 DIAGNOSIS — Z171 Estrogen receptor negative status [ER-]: Secondary | ICD-10-CM

## 2020-07-29 DIAGNOSIS — C50412 Malignant neoplasm of upper-outer quadrant of left female breast: Secondary | ICD-10-CM

## 2020-07-29 MED ORDER — CAPECITABINE 500 MG PO TABS: ORAL_TABLET | ORAL | 0 refills | Status: AC

## 2020-07-29 NOTE — Progress Notes (Signed)
Received refill request for Xeloda from [facility]. Refill escribed.

## 2020-08-05 ENCOUNTER — Encounter: Payer: Self-pay | Admitting: Nurse Practitioner

## 2020-08-05 ENCOUNTER — Inpatient Hospital Stay (HOSPITAL_BASED_OUTPATIENT_CLINIC_OR_DEPARTMENT_OTHER): Payer: 59 | Admitting: Nurse Practitioner

## 2020-08-05 ENCOUNTER — Inpatient Hospital Stay: Payer: 59

## 2020-08-05 ENCOUNTER — Ambulatory Visit (HOSPITAL_COMMUNITY)
Admission: RE | Admit: 2020-08-05 | Discharge: 2020-08-05 | Disposition: A | Discharge status: Home / Self Care | Payer: 59 | Source: Ambulatory Visit | Attending: Nurse Practitioner | Admitting: Nurse Practitioner

## 2020-08-05 ENCOUNTER — Other Ambulatory Visit: Payer: Self-pay

## 2020-08-05 VITALS — BP 113/82 | HR 88 | Temp 97.8°F | Resp 16 | Ht 61.0 in | Wt 136.9 lb

## 2020-08-05 DIAGNOSIS — Z171 Estrogen receptor negative status [ER-]: Secondary | ICD-10-CM

## 2020-08-05 DIAGNOSIS — C50412 Malignant neoplasm of upper-outer quadrant of left female breast: Secondary | ICD-10-CM

## 2020-08-05 DIAGNOSIS — Z95828 Presence of other vascular implants and grafts: Secondary | ICD-10-CM

## 2020-08-05 LAB — CBC WITH DIFFERENTIAL (CANCER CENTER ONLY)
Abs Immature Granulocytes: 0.03 10*3/uL (ref 0.00–0.07)
Basophils Absolute: 0 10*3/uL (ref 0.0–0.1)
Basophils Relative: 0 %
Eosinophils Absolute: 0.1 10*3/uL (ref 0.0–0.5)
Eosinophils Relative: 2 %
HCT: 31.8 % — ABNORMAL LOW (ref 36.0–46.0)
Hemoglobin: 10.3 g/dL — ABNORMAL LOW (ref 12.0–15.0)
Immature Granulocytes: 1 %
Lymphocytes Relative: 9 %
Lymphs Abs: 0.6 10*3/uL — ABNORMAL LOW (ref 0.7–4.0)
MCH: 31.3 pg (ref 26.0–34.0)
MCHC: 32.4 g/dL (ref 30.0–36.0)
MCV: 96.7 fL (ref 80.0–100.0)
Monocytes Absolute: 0.8 10*3/uL (ref 0.1–1.0)
Monocytes Relative: 12 %
Neutro Abs: 4.8 10*3/uL (ref 1.7–7.7)
Neutrophils Relative %: 76 %
Platelet Count: 257 10*3/uL (ref 150–400)
RBC: 3.29 MIL/uL — ABNORMAL LOW (ref 3.87–5.11)
RDW: 19.4 % — ABNORMAL HIGH (ref 11.5–15.5)
WBC Count: 6.3 10*3/uL (ref 4.0–10.5)
nRBC: 0 % (ref 0.0–0.2)

## 2020-08-05 LAB — CMP (CANCER CENTER ONLY)
ALT: 27 U/L (ref 0–44)
AST: 28 U/L (ref 15–41)
Albumin: 4.3 g/dL (ref 3.5–5.0)
Alkaline Phosphatase: 113 U/L (ref 38–126)
Anion gap: 12 (ref 5–15)
BUN: 12 mg/dL (ref 6–20)
CO2: 28 mmol/L (ref 22–32)
Calcium: 10.1 mg/dL (ref 8.9–10.3)
Chloride: 101 mmol/L (ref 98–111)
Creatinine: 0.65 mg/dL (ref 0.44–1.00)
GFR, Estimated: >60 mL/min (ref >60)
Glucose, Bld: 104 mg/dL — ABNORMAL HIGH (ref 70–99)
Potassium: 3.5 mmol/L (ref 3.5–5.1)
Sodium: 141 mmol/L (ref 135–145)
Total Bilirubin: 0.8 mg/dL (ref 0.3–1.2)
Total Protein: 7.5 g/dL (ref 6.5–8.1)

## 2020-08-05 IMAGING — DX DG HIP (WITH OR WITHOUT PELVIS) 1V*R*
3 series · 3 of 3 positions shown · non-contrast
Comparison: None.

CLINICAL DATA: Metastatic breast cancer.  Right hip pain

EXAM:
DG HIP (WITH OR WITHOUT PELVIS) 1V RIGHT

[hip ap]
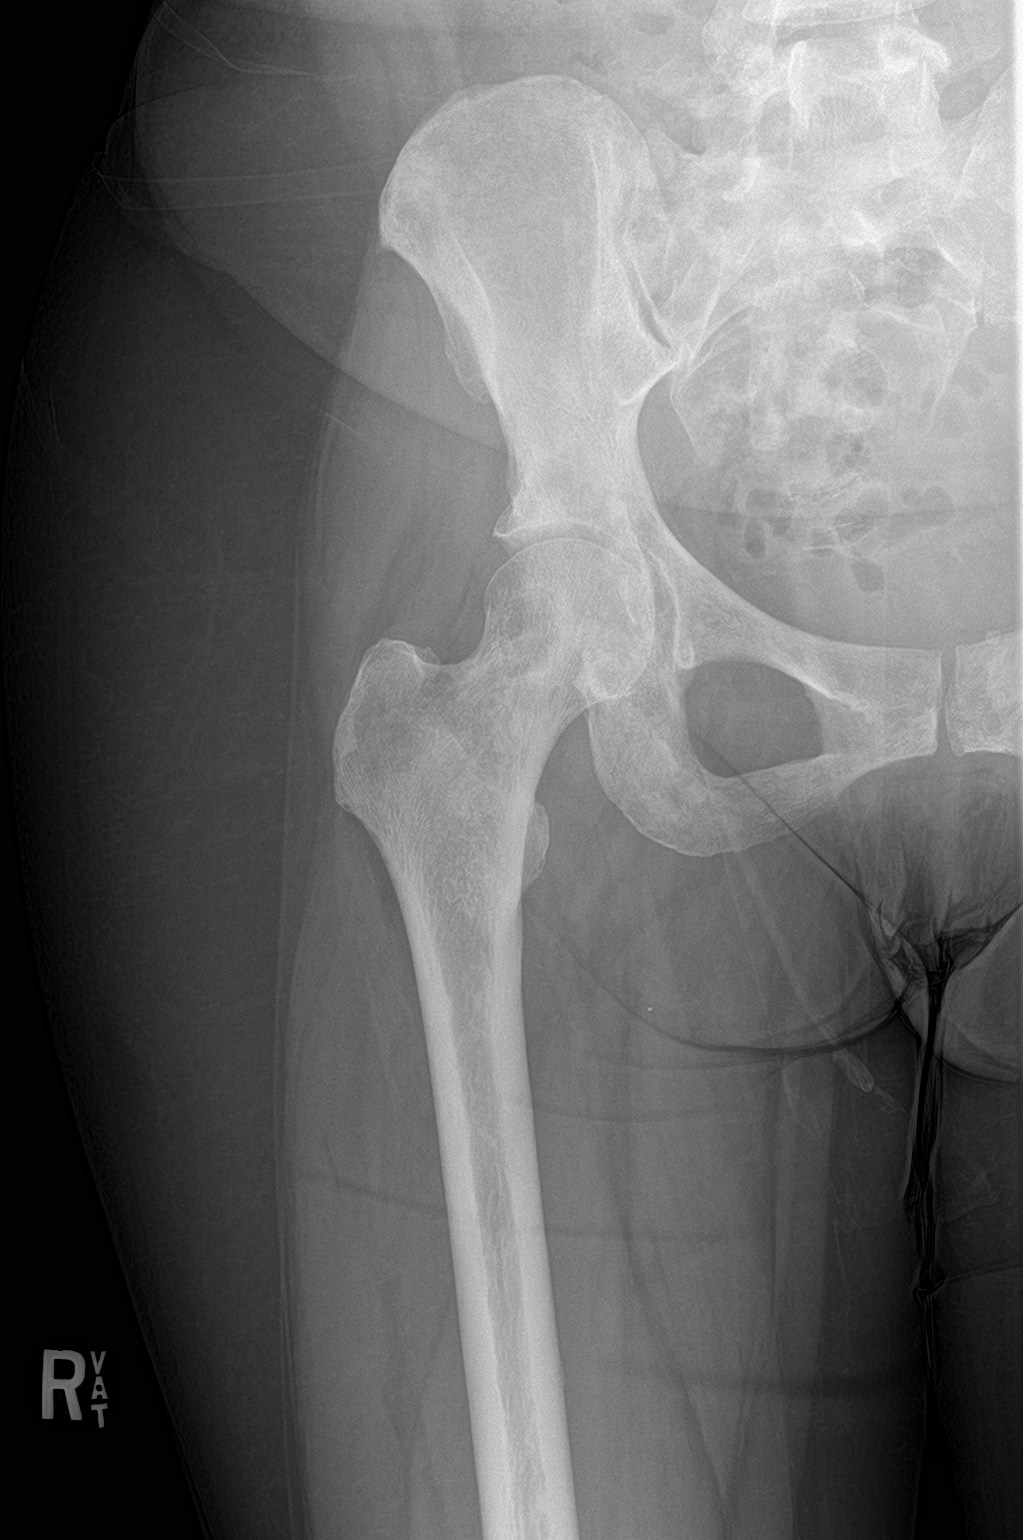

[hip lat]
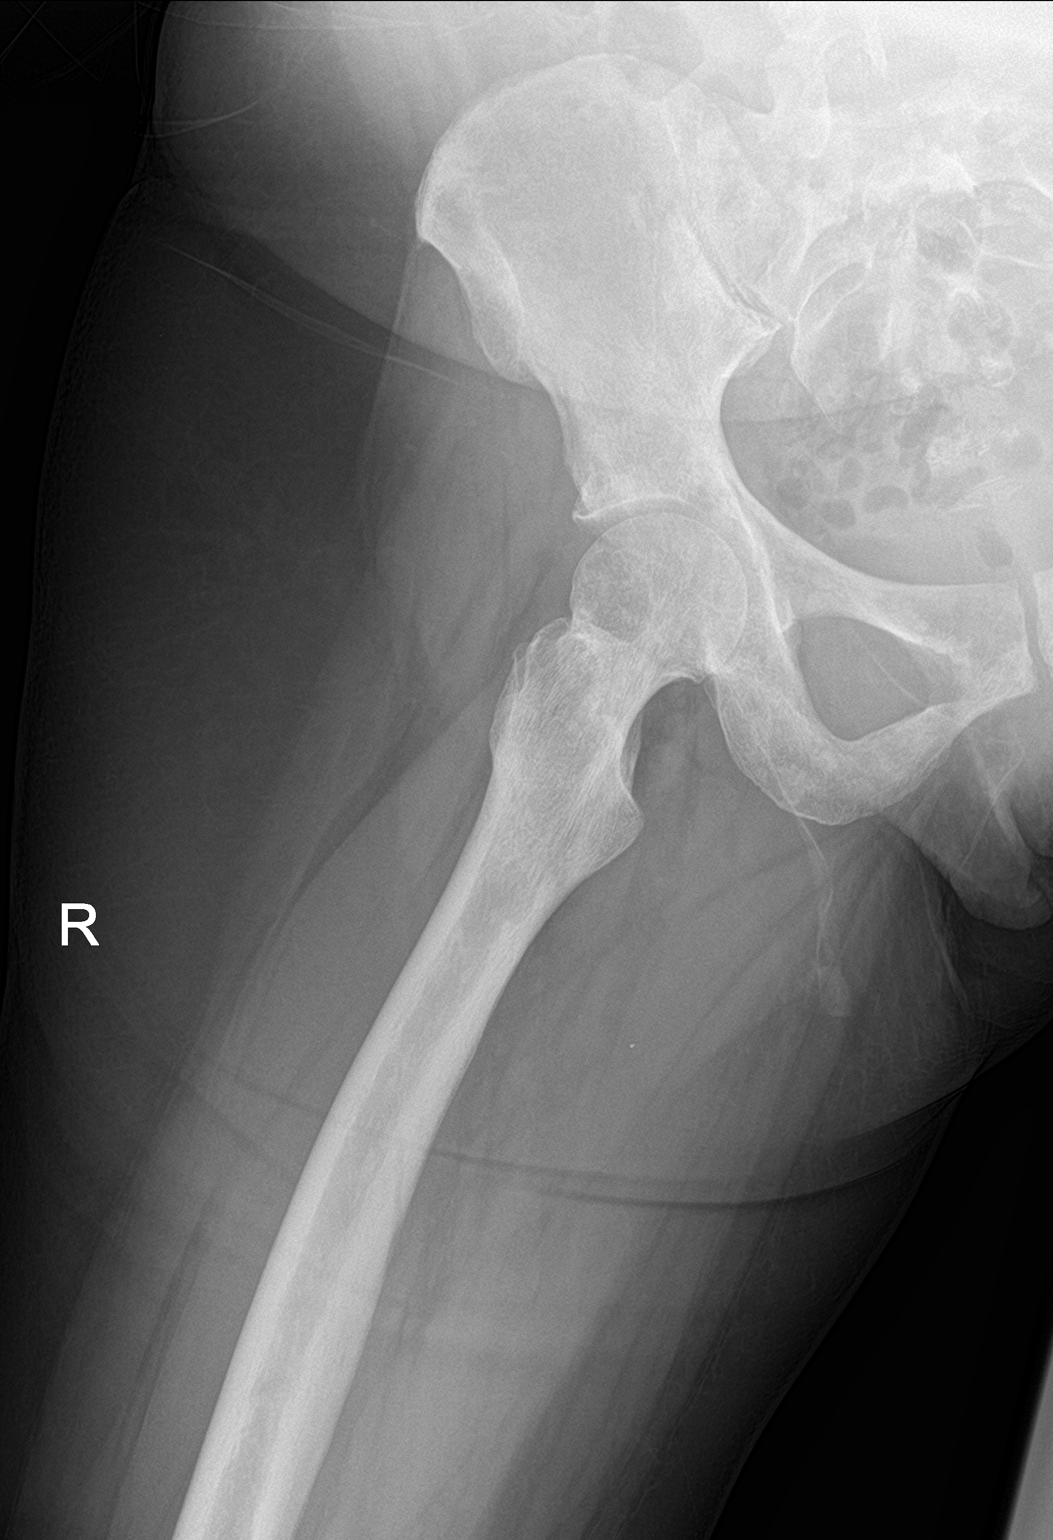

[pelvis ap]
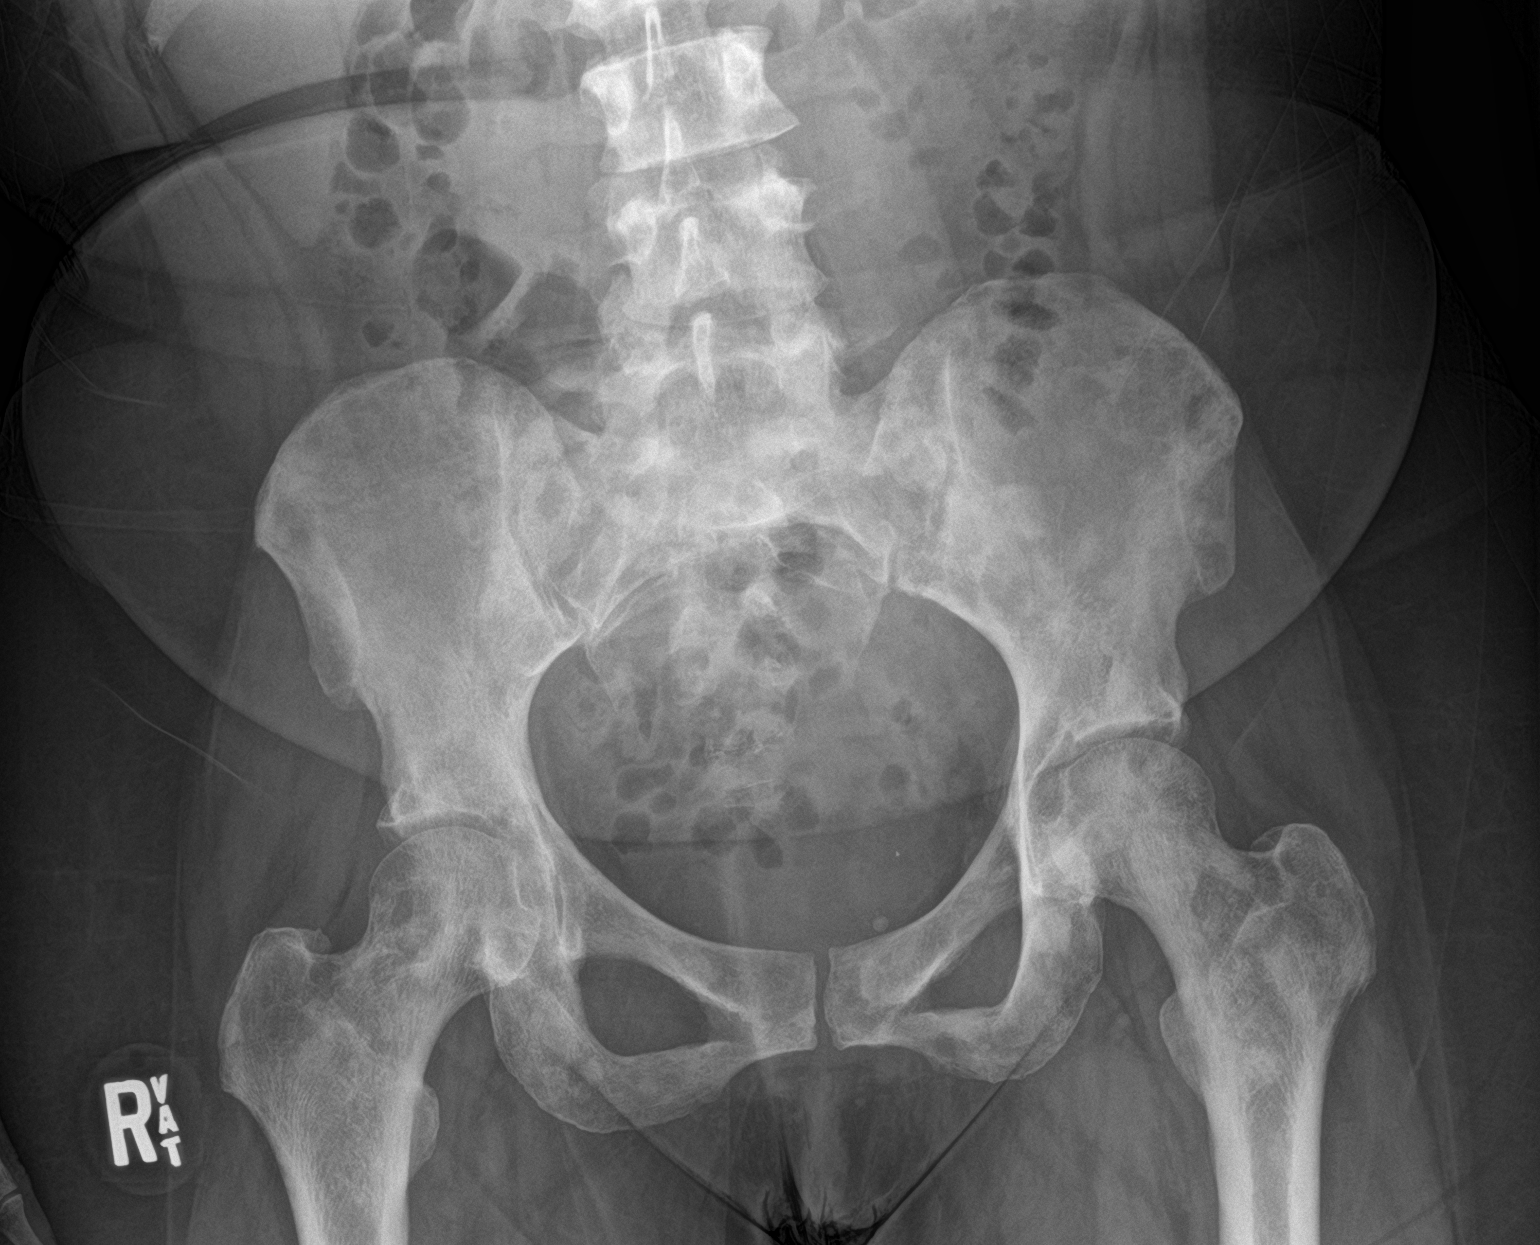

[3 of 3 positions shown; findings below may reference images not displayed]

FINDINGS: Diffuse sclerotic metastatic disease throughout the pelvis and
proximal femur bilaterally.

Negative for fracture.  Right hip joint space normal.
IMPRESSION: Widespread bony metastatic disease.  Negative for fracture.

## 2020-08-05 IMAGING — DX DG FEMUR 2+V*R*
4 series · 4 of 4 positions shown · non-contrast
Comparison: None.

CLINICAL DATA: Metastatic breast cancer.  Right hip pain 3 weeks

EXAM:
RIGHT FEMUR 2 VIEWS

[femur ap (1 of 2)]
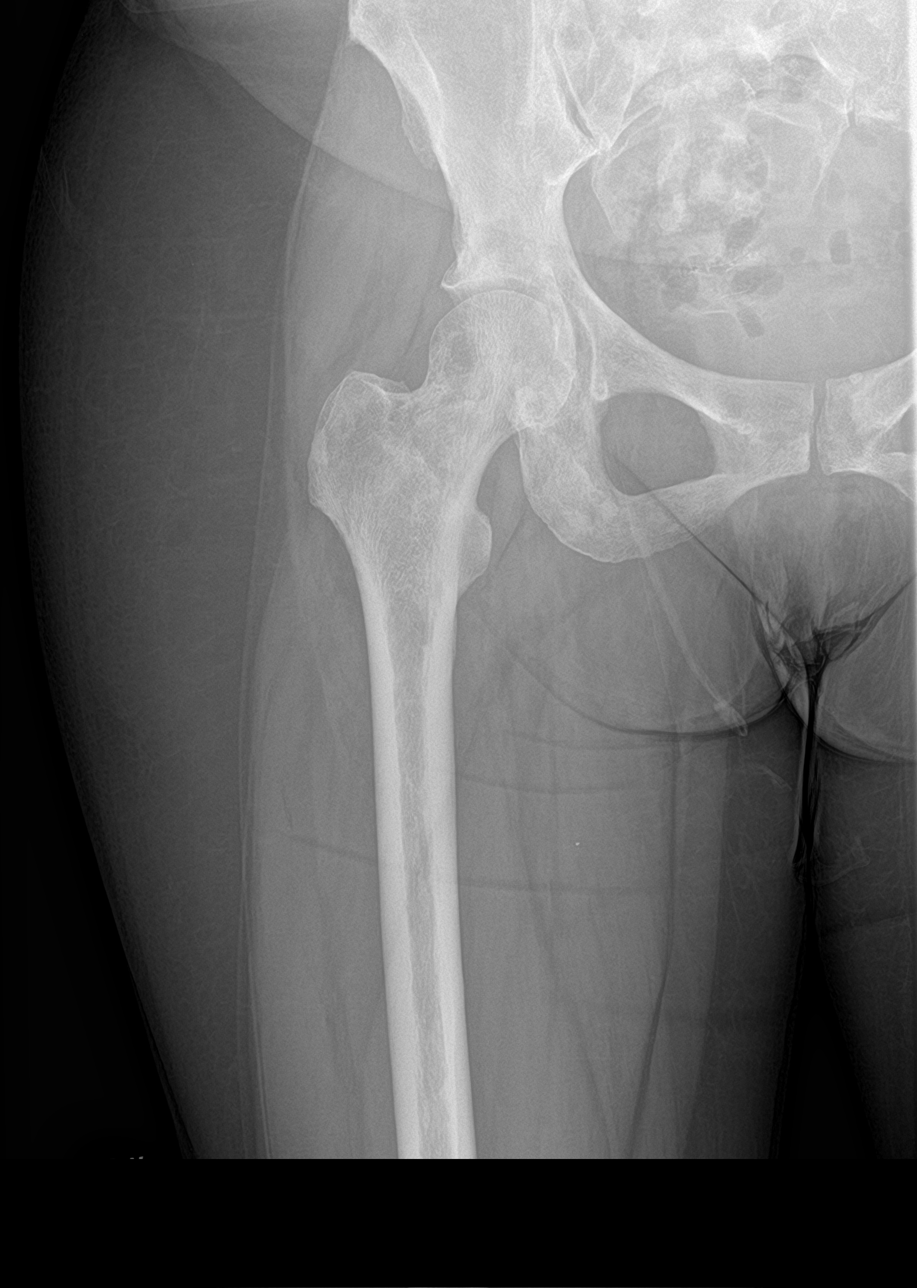

[femur ap (2 of 2)]
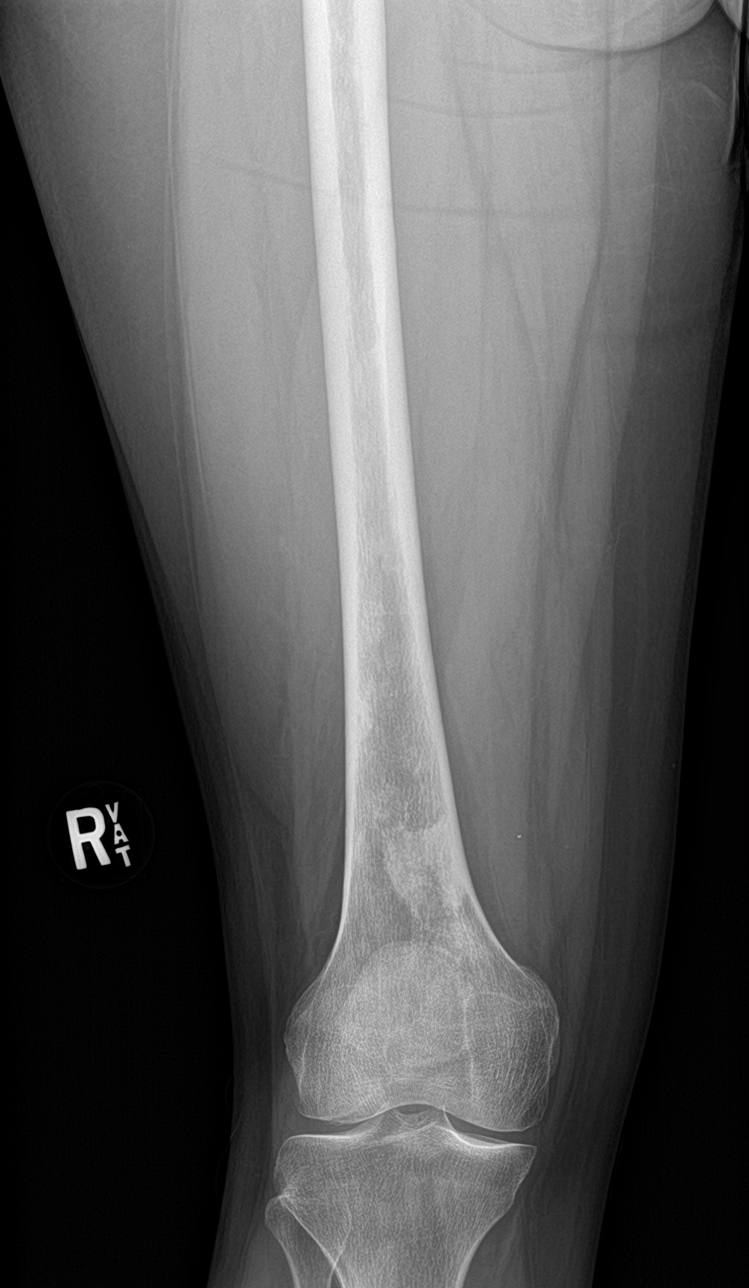

[femur lat (1 of 2)]
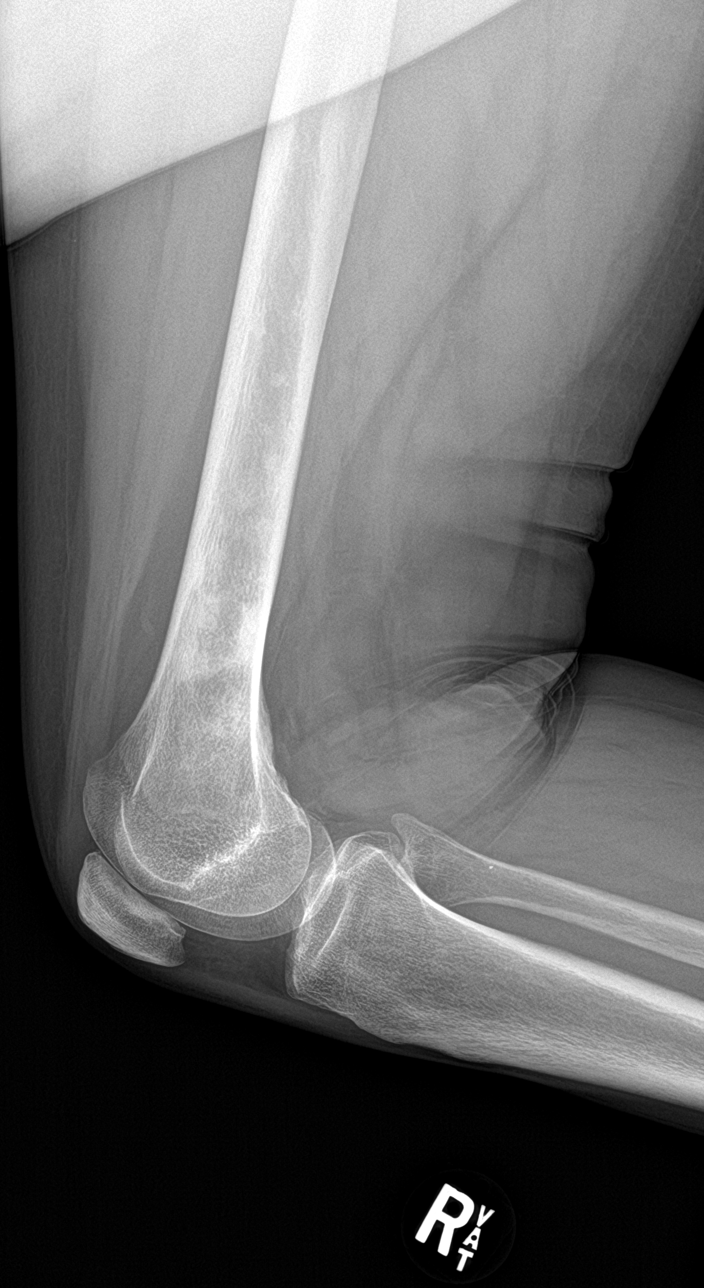

[femur lat (2 of 2)]
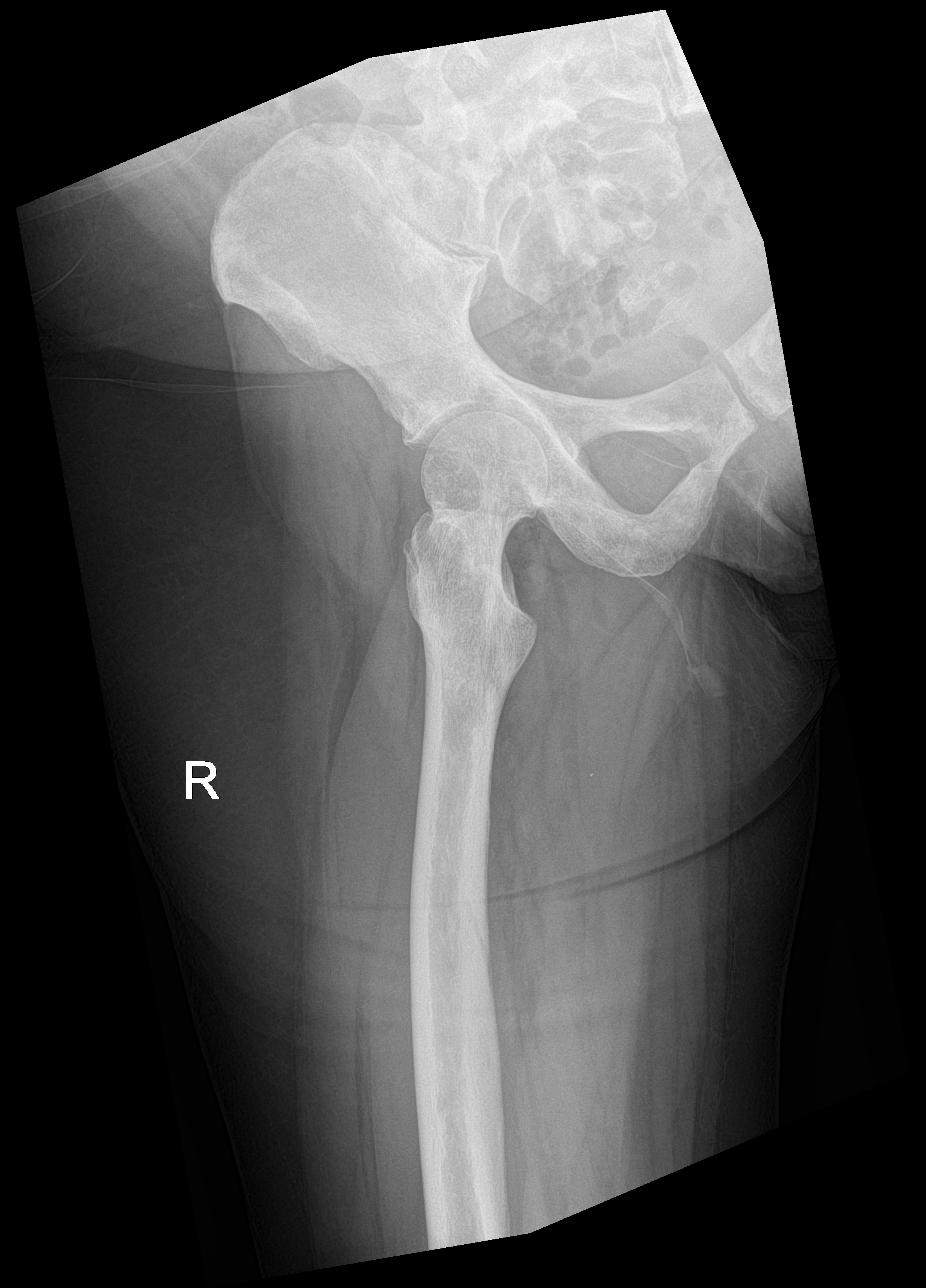

[4 of 4 positions shown; findings below may reference images not displayed]

FINDINGS: Patchy sclerotic metastatic disease is present in the right femur
and throughout the pelvis. Normal right hip and right knee.

Negative for fracture.
IMPRESSION: Bony metastatic disease in the right femur.  Negative for fracture.

## 2020-08-05 MED ORDER — SODIUM CHLORIDE 0.9% FLUSH
10.0000 mL | Freq: Once | INTRAVENOUS | Status: AC
  Administered 2020-08-05: 10 mL
  Filled 2020-08-05: qty 10

## 2020-08-05 MED ORDER — HEPARIN SOD (PORK) LOCK FLUSH 100 UNIT/ML IV SOLN
500.0000 [IU] | Freq: Once | INTRAVENOUS | Status: AC
  Administered 2020-08-05: 500 [IU]
  Filled 2020-08-05: qty 5

## 2020-08-05 NOTE — Progress Notes (Addendum)
[facility] OFFICE PROGRESS NOTE   Diagnosis: Breast cancer  INTERVAL HISTORY:   Abigail [name] returns as scheduled.  She completed cycle 4 Xeloda beginning 07/17/2020.  She denies nausea/vomiting.  No mouth sores.  No diarrhea.  She notes darkness over the palms as well as dryness.  She reports a 3-week history of right hip and leg pain.  No leg weakness or numbness.  Right chest wall nodules have increased in size.  Left breast is now more firm, painful.  Objective:  Vital signs in last 24 hours:  Blood pressure 113/82, pulse 88, temperature 97.8 F (36.6 C), temperature source Tympanic, resp. rate 16, height 5' 1" (1.549 m), weight 136 lb 14.4 oz (62.1 kg), SpO2 100 %.    HEENT: No thrush or ulcers. Lymphatics: No palpable cervical, supraclavicular or left axillary lymph nodes. Resp: Lungs clear bilaterally. Cardio: Regular rate and rhythm. GI: Abdomen soft and nontender.  No hepatomegaly. Vascular: No leg edema. Neuro: Lower extremity strength is intact. Skin: Several cutaneous nodules posterior left axilla, left lateral upper back; increased nodularity anterior lateral chest and a nodule in the deep left axilla. Breast: Firm mass upper outer left breast, increased edema involving the entire breast. Musculoskeletal: Nontender over the right hip and femur. Port-A-Cath without erythema.  Lab Results:  Lab Results  Component Value Date   WBC 6.3 08/05/2020   HGB 10.3 (L) 08/05/2020   HCT 31.8 (L) 08/05/2020   MCV 96.7 08/05/2020   PLT 257 08/05/2020   NEUTROABS 4.8 08/05/2020    Imaging:  No results found.  Medications: I have reviewed the patient's current medications.  Assessment/Plan: 1. Metastatic breast cancer  CT chest 10/25/2019-left breast mass left axillary lymph nodes, abnormal appearance of the left concerning for tumor involvement, mediastinal adenopathy, diffuse sclerotic and lytic lesions, left pleural effusion  CT abdomen/pelvis  10/26/2019 enhancing left breast mass with left axillary lymphadenopathy and tumor involved the left chest wall the latissimus dorsi segment 4B liver lesion, diffuse lytic/sclerotic lesions  Ultrasound-guided biopsy of left liver lesion 10/29/1999-metastatic carcinoma, cytokeratin andGATA3positive consistent with metastatic breast cancer. HER-2 negative, ER negative, PR negative, Ki-6720%. PD-L1 <1% expression   Invitae hereditary panel negative  Initiated systemic chemotherapy weekly abraxane 100 mg/m2 days 1, 8, 15 q28 days   Cycle 1 day 1 on 11/14/19  Cycle 1 day 8 on 11/21/19 (hg 7.5, receive 1 unit RBCs)  Cycle 1 day 15 on 11/28/2019  Cycle 1 Adriamycin/Cytoxan 12/12/2019  Cycle 2 Adriamycin/Cytoxan 01/02/2020  Cycle 3 Adriamycin/Cytoxan 01/23/2020  Cycle 4 Adriamycin/Cytoxan 02/13/2020  Cycle 5 Adriamycin/Cytoxan 03/05/2020  Cycle 6 Adriamycin/Cytoxan 03/26/2020  Restaging chest CT 03/24/2020-residual soft tissue lesion in the lateral left breast, partially imaged. Associated with improving soft tissue thickening, nodularity and stranding in the left axilla without a discrete measurable lesion. Resolved left pleural effusion with left chest tube in place. 2 mm right upper lobe nodule decreased in size from 10/25/2019. Diffuse osseous metastatic disease.  Cycle 7 Adriamycin/Cytoxan 04/16/2020  Cycle 1 Xeloda 05/15/2020  Cycle 2 Xeloda 06/05/2020  Cycle 3 Xeloda 06/26/2020  Cycle 4 Xeloda 07/17/2020  Xeloda discontinued 08/05/2020 due to progression 2. Left upper extremity DVT-left subclavian, axillary, and brachial vein thrombosis confirmed on  Anticoagulation with apixaban, changed to parenteral hospital admission1/14/2021transition to twice daily Lovenox at discharge; transition to once daily 11/08/2019.  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 Prophylactic ciprofloxacin initiated. 9. Hospital admission 12/19/2019-acute respiratory failure with hypoxia, large left pleural effusion, ? HCAP, pancytopenia  Disposition: Abigail [name] has completed 4 cycles of Xeloda.  There is clinical evidence of disease progression on exam today.  She will discontinue Xeloda.  Dr. Sherrill recommends a referral to Dr. Dees at [facility] to discuss treatment options, clinical trial availability.  Abigail [name] agrees.  We made the referral today.  She is having pain at the right hip/leg.  Plan for plain x-rays today.  We reviewed the CBC from today.  Blood counts are stable.  She will return for a follow-up appointment in approximately 2 weeks.  We are available to see her sooner if needed.  Patient seen with Dr. Sherrill.    ANP/GNP-BC   08/05/2020  9:29 AM  This was a shared visit with  .  Abigail [name] was interviewed and examined.  There is clinical evidence of disease progression while on Xeloda.  The left breast mass and skin nodules appear larger.  She will be referred for an x-ray to evaluate the right leg discomfort.  She agrees to a referral to [facility] to consider clinical trials and standard systemic treatment options.  Brad Sherrill, MD      

## 2020-08-05 NOTE — Patient Instructions (Signed)

## 2020-08-06 ENCOUNTER — Telehealth: Payer: Self-pay | Admitting: Nurse Practitioner

## 2020-08-06 ENCOUNTER — Telehealth: Payer: Self-pay | Admitting: Oncology

## 2020-08-06 ENCOUNTER — Other Ambulatory Visit: Payer: Self-pay | Admitting: Nurse Practitioner

## 2020-08-06 ENCOUNTER — Telehealth: Payer: Self-pay | Admitting: *Deleted

## 2020-08-06 ENCOUNTER — Other Ambulatory Visit: Payer: Self-pay

## 2020-08-06 ENCOUNTER — Telehealth: Payer: Self-pay

## 2020-08-06 DIAGNOSIS — N6320 Unspecified lump in the left breast, unspecified quadrant: Secondary | ICD-10-CM

## 2020-08-06 LAB — CANCER ANTIGEN 27.29: CA 27.29: 61.3 U/mL — ABNORMAL HIGH (ref 0.0–38.6)

## 2020-08-06 MED ORDER — HYDROCODONE-ACETAMINOPHEN 5-325 MG PO TABS
1.0000 | ORAL_TABLET | Freq: Three times a day (TID) | ORAL | 0 refills | Status: AC | PRN
Start: 2020-08-06 — End: ?

## 2020-08-06 NOTE — Telephone Encounter (Signed)
Abigail [name] called to f/u on her hydrocodone/apap refill--made her aware it was sent this afternoon. Notified of her films of right hip/femur with metastatic disease, but no pending fractures noted. Spoke with new patient coordinator w/Dr. Albertina Parr at [facility] and they have received her referral and are working on getting her in soon. Referral order, demographics, and chart information faxed to [facility] [phone]

## 2020-08-06 NOTE — Telephone Encounter (Signed)
Pt called left message to request Norco refill

## 2020-08-06 NOTE — Telephone Encounter (Signed)
Scheduled appointment per 10/26 los. Spoke to patient who is aware of appointment date and time.  

## 2020-08-06 NOTE — Telephone Encounter (Signed)
Release: [id number] Faxed medical records to [facility] Dr. Samuel Bouche @ [phone]

## 2020-08-06 NOTE — Progress Notes (Signed)
Refill request forwarded to provider

## 2020-08-07 ENCOUNTER — Telehealth: Payer: Self-pay | Admitting: *Deleted

## 2020-08-07 NOTE — Telephone Encounter (Signed)
Provided appointment for 08/13/20 at 12:30/1300 with Dr. Andee Poles File (per Dr. Albertina Parr choice). They have not been able to reach patient and requested our office notify her. They will email her information packed. Called and left VM for patient to call back to obtain her referral appointment.

## 2020-08-08 NOTE — Telephone Encounter (Signed)
Called her w/appointment information at [facility]. She was not where she could write it down. Asking for have it sent to her via [place].

## 2020-08-12 ENCOUNTER — Telehealth: Payer: Self-pay | Admitting: *Deleted

## 2020-08-12 NOTE — Telephone Encounter (Signed)
Patient left VM requesting a return call today. Called her back and she reports she saw Dr. Wyatt Portela today due to gradual visual loss in her left eye. 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 She reports it was a gradual loss since last week. He told her she needs to be admitted to hospital for labs/MRI. She is asking for Dr. Gearldine Shown input--she wants to keep her appointment tomorrow at [facility] for 2nd opinion. Per Dr. Benay Spice: Go to hospital for admission per Dr. Zenia Resides suggestion. Will most likely need neurologist. Will call [facility] tomorrow and reschedule her appointment.

## 2020-08-13 ENCOUNTER — Encounter (HOSPITAL_COMMUNITY): Payer: Self-pay | Admitting: Emergency Medicine

## 2020-08-13 ENCOUNTER — Telehealth: Payer: Self-pay | Admitting: *Deleted

## 2020-08-13 ENCOUNTER — Other Ambulatory Visit: Payer: Self-pay

## 2020-08-13 ENCOUNTER — Emergency Department (HOSPITAL_COMMUNITY)
Admission: EM | Admit: 2020-08-13 | Discharge: 2020-08-13 | Disposition: A | Discharge status: Home / Self Care | Payer: 59 | Source: Home / Self Care | Attending: Emergency Medicine | Admitting: Emergency Medicine

## 2020-08-13 ENCOUNTER — Emergency Department (HOSPITAL_COMMUNITY): Payer: 59

## 2020-08-13 DIAGNOSIS — Z20822 Contact with and (suspected) exposure to covid-19: Secondary | ICD-10-CM | POA: Insufficient documentation

## 2020-08-13 DIAGNOSIS — R400 Somnolence: Secondary | ICD-10-CM | POA: Diagnosis not present

## 2020-08-13 DIAGNOSIS — R531 Weakness: Secondary | ICD-10-CM | POA: Insufficient documentation

## 2020-08-13 DIAGNOSIS — H538 Other visual disturbances: Secondary | ICD-10-CM

## 2020-08-13 DIAGNOSIS — C7931 Secondary malignant neoplasm of brain: Secondary | ICD-10-CM | POA: Diagnosis not present

## 2020-08-13 LAB — CBC
HCT: 35.1 % — ABNORMAL LOW (ref 36.0–46.0)
Hemoglobin: 10.6 g/dL — ABNORMAL LOW (ref 12.0–15.0)
MCH: 29.9 pg (ref 26.0–34.0)
MCHC: 30.2 g/dL (ref 30.0–36.0)
MCV: 98.9 fL (ref 80.0–100.0)
Platelets: 185 10*3/uL (ref 150–400)
RBC: 3.55 MIL/uL — ABNORMAL LOW (ref 3.87–5.11)
RDW: 18.9 % — ABNORMAL HIGH (ref 11.5–15.5)
WBC: 7 10*3/uL (ref 4.0–10.5)
nRBC: 0 % (ref 0.0–0.2)

## 2020-08-13 LAB — BASIC METABOLIC PANEL
Anion gap: 13 (ref 5–15)
BUN: 12 mg/dL (ref 6–20)
CO2: 29 mmol/L (ref 22–32)
Calcium: 10.2 mg/dL (ref 8.9–10.3)
Chloride: 93 mmol/L — ABNORMAL LOW (ref 98–111)
Creatinine, Ser: 0.52 mg/dL (ref 0.44–1.00)
GFR, Estimated: >60 mL/min (ref >60)
Glucose, Bld: 119 mg/dL — ABNORMAL HIGH (ref 70–99)
Potassium: 3.2 mmol/L — ABNORMAL LOW (ref 3.5–5.1)
Sodium: 135 mmol/L (ref 135–145)

## 2020-08-13 LAB — DIFFERENTIAL
Abs Immature Granulocytes: 0.03 10*3/uL (ref 0.00–0.07)
Basophils Absolute: 0 10*3/uL (ref 0.0–0.1)
Basophils Relative: 0 %
Eosinophils Absolute: 0.1 10*3/uL (ref 0.0–0.5)
Eosinophils Relative: 1 %
Immature Granulocytes: 0 %
Lymphocytes Relative: 8 %
Lymphs Abs: 0.6 10*3/uL — ABNORMAL LOW (ref 0.7–4.0)
Monocytes Absolute: 0.8 10*3/uL (ref 0.1–1.0)
Monocytes Relative: 12 %
Neutro Abs: 5.4 10*3/uL (ref 1.7–7.7)
Neutrophils Relative %: 79 %

## 2020-08-13 LAB — I-STAT BETA HCG BLOOD, ED (MC, WL, AP ONLY): I-stat hCG, quantitative: 10.1 m[IU]/mL — ABNORMAL HIGH (ref <5)

## 2020-08-13 LAB — APTT: aPTT: 23 seconds — ABNORMAL LOW (ref 24–36)

## 2020-08-13 LAB — RESP PANEL BY RT PCR (RSV, FLU A&B, COVID)
Influenza A by PCR: NEGATIVE
Influenza B by PCR: NEGATIVE
Respiratory Syncytial Virus by PCR: NEGATIVE
SARS Coronavirus 2 by RT PCR: NEGATIVE

## 2020-08-13 LAB — PROTIME-INR
INR: 1.2 (ref 0.8–1.2)
Prothrombin Time: 15.1 seconds (ref 11.4–15.2)

## 2020-08-13 IMAGING — MR MR HEAD WO/W CM
9 of 13 series · 33 of 48 positions shown · IV contrast (Yes   MULTIHANCE)
Comparison: None.

CLINICAL DATA: Blurry vision left eye, reported abnormal finding
eye doctor; history of breast cancer

EXAM:
MRI HEAD AND ORBITS WITHOUT AND WITH CONTRAST
TECHNIQUE: Multiplanar, multiecho pulse sequences of the brain and surrounding
structures were obtained without and with intravenous contrast.
Multiplanar, multiecho pulse sequences of the orbits and surrounding
structures were obtained including fat saturation techniques, before
and after intravenous contrast administration.
CONTRAST:  6mL GADAVIST GADOBUTROL 1 MMOL/ML IV SOLN

[Series 3: DWI · axial · 3.0mm · 1.09mm/px · z∈[-104,+34]mm · 9 of 96 slices shown (1 of 4)]
[im 1/96]
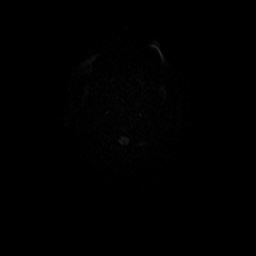
[im 12/96]
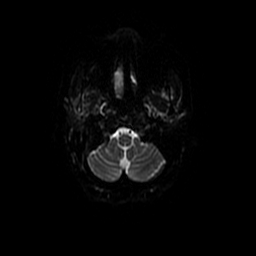
[im 24/96]
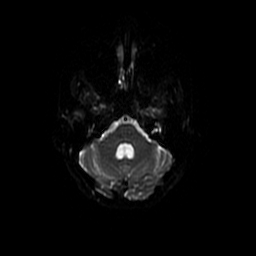
[im 36/96]
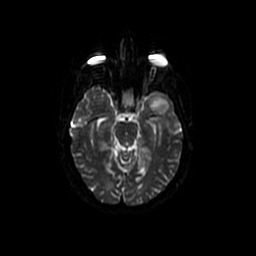
[im 48/96]
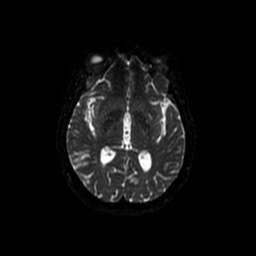
[im 60/96]
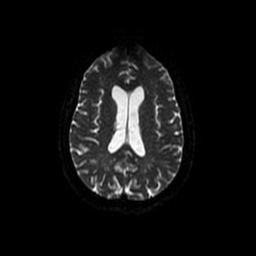
[im 72/96]
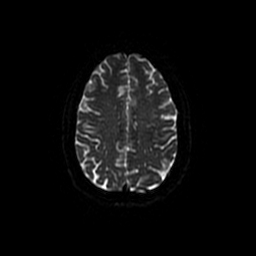
[im 84/96]
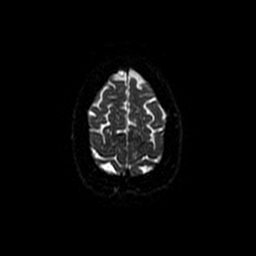
[im 96/96]
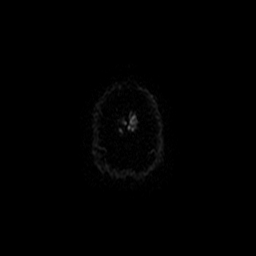

[Series 4: DWI · coronal · 5.0mm · 1.09mm/px · 6 of 74 slices shown (2 of 4)]
[im 1/74]
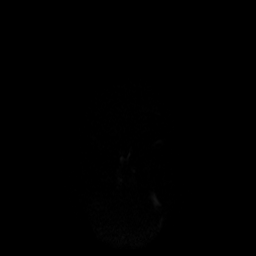
[im 15/74]
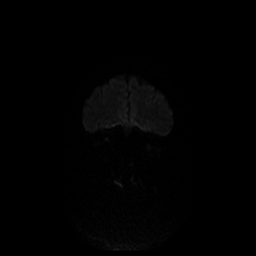
[im 30/74]
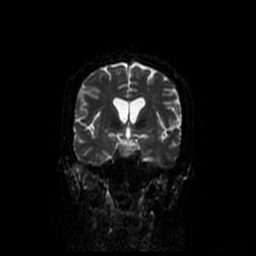
[im 44/74]
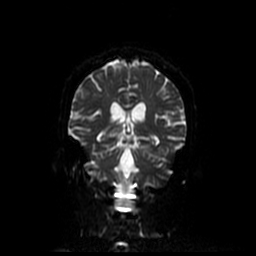
[im 59/74]
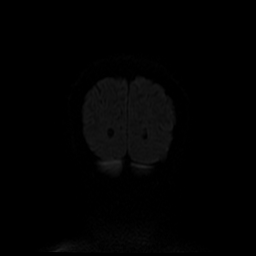
[im 74/74]
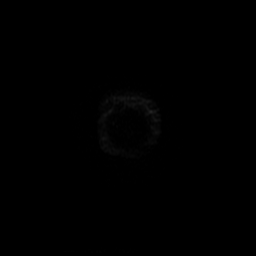

[Series 6: T2 · axial · 5.0mm · 0.43mm/px · 1 of 24 slices shown]
[im 1/24]
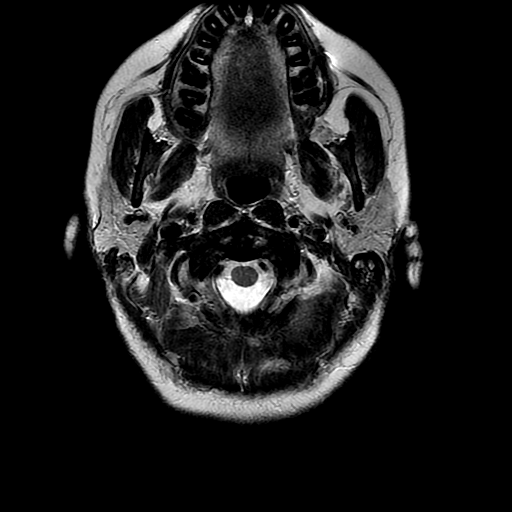

[Series 7: FLAIR · axial · 3.0mm · 0.43mm/px · z∈[-96,+39]mm · 2 of 24 slices shown]
[im 1/24]
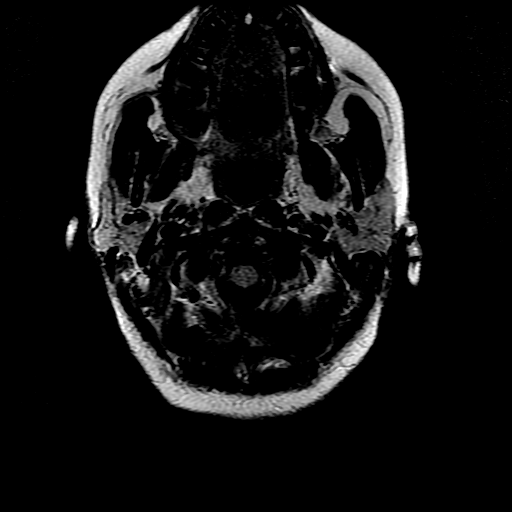
[im 24/24]
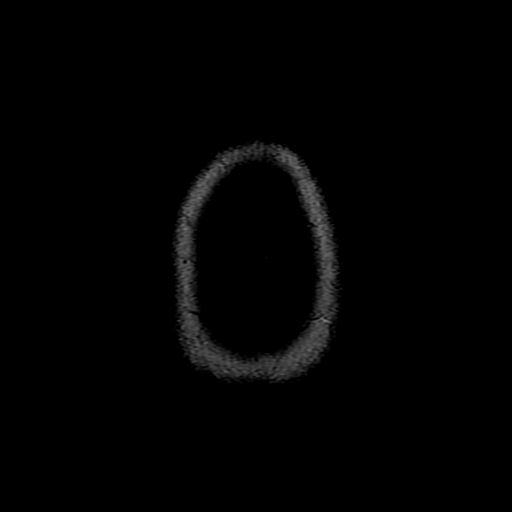

[Series 18: T1 post-contrast · axial · 3.0mm · 0.47mm/px · z∈[-103,+41]mm · 4 of 50 slices shown (1 of 3)]
[im 1/50]
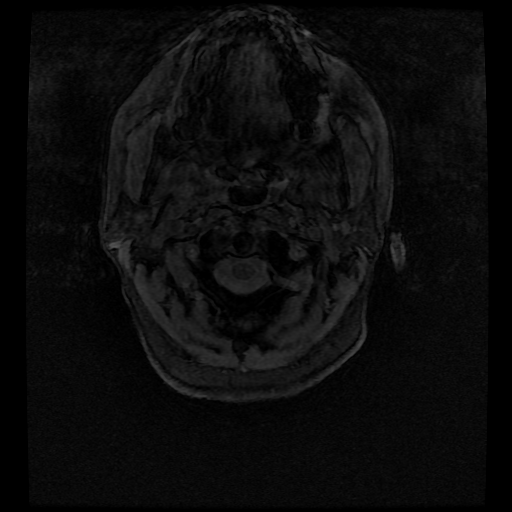
[im 17/50]
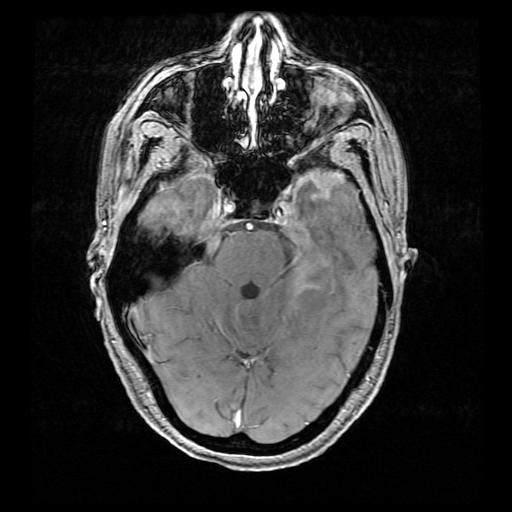
[im 33/50]
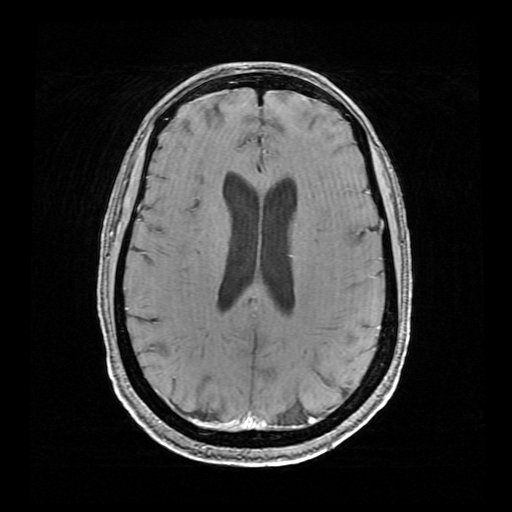
[im 50/50]
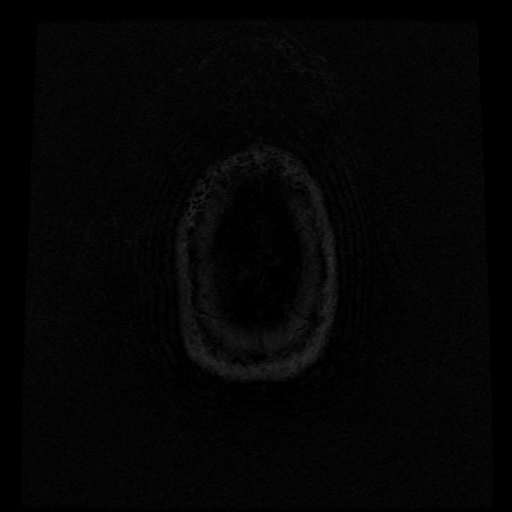

[Series 19: T1 post-contrast · coronal · 5.0mm · 0.39mm/px · 2 of 28 slices shown (2 of 3)]
[im 1/28]
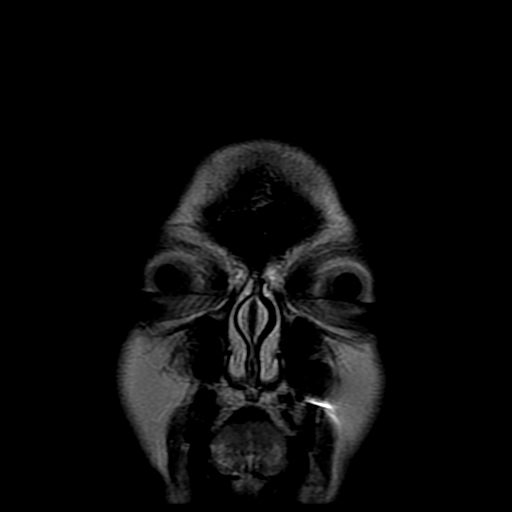
[im 28/28]
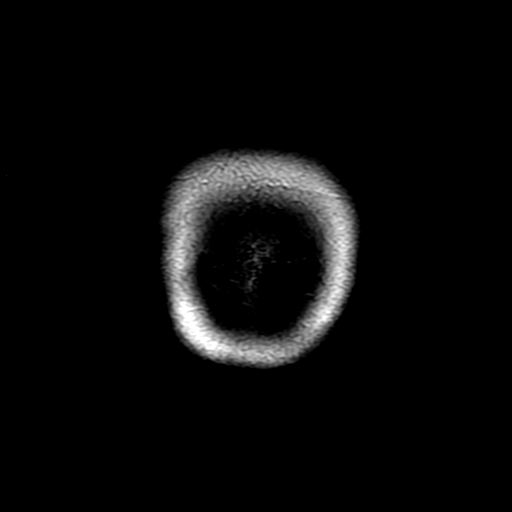

[Series 20: T1 post-contrast · sagittal · 5.0mm · 0.47mm/px · 2 of 23 slices shown (3 of 3)]
[im 1/23]
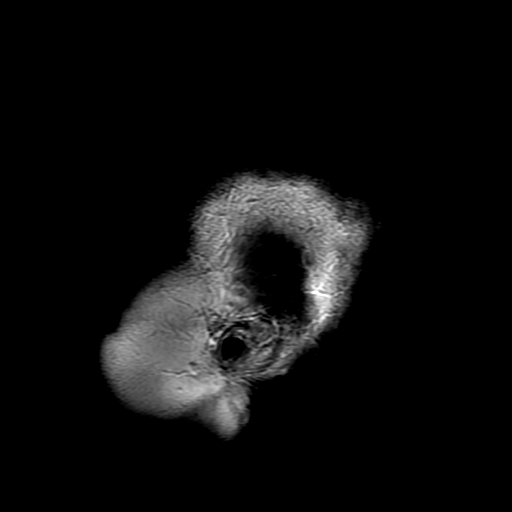
[im 23/23]
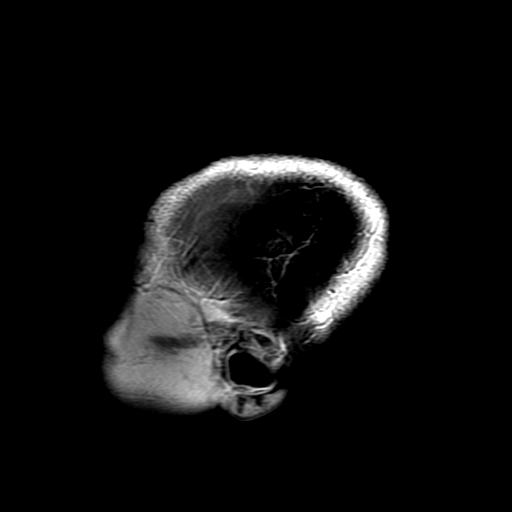

[Series 300: DWI · axial · 3.0mm · 1.09mm/px · z∈[-104,+34]mm · 4 of 48 slices shown (3 of 4)]
[im 1/48]
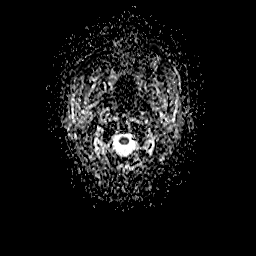
[im 16/48]
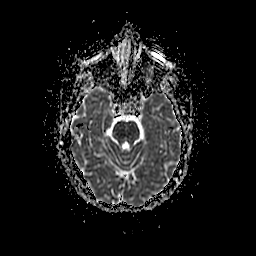
[im 32/48]
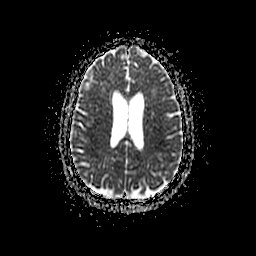
[im 48/48]
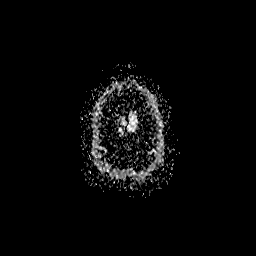

[Series 400: DWI · coronal · 5.0mm · 1.09mm/px · 3 of 37 slices shown (4 of 4)]
[im 1/37]
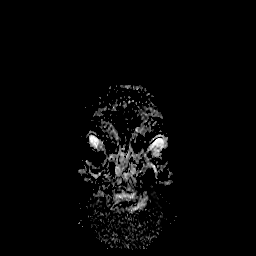
[im 19/37]
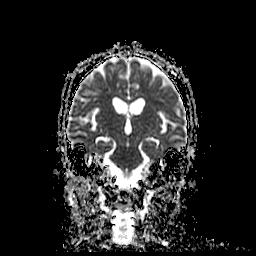
[im 37/37]
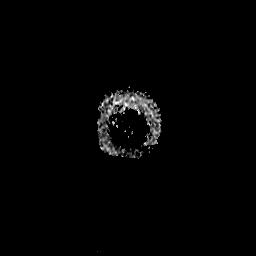

[33 of 48 positions shown; findings below may reference images not displayed]

FINDINGS: MRI HEAD FINDINGS

Brain: See series 16 for saved images. There is slightly nodular
dural-based enhancement along the left sphenoid bone and middle
cranial fossa. Mild edema is present in the underlying left temporal
lobe. Suspected adjacent sulcal leptomeningeal enhancement. Dural
enhancement extends posteriorly along the left tentorial insertion.
Much less prominent dural-based enhancement is also likely present
along the medial margin of the right middle cranial fossa.

There is abnormal enhancement within the internal auditory canals.
There is also abnormal enhancement along the trigeminal nerves.
Suspected abnormal leptomeningeal enhancement along the brainstem.

Enhancement measuring 9 x 7 mm along the left anterior frontal
convexity (series 16, image 19).

There is no acute infarction or intracranial hemorrhage. Prominence
of the ventricles and sulci reflects minor generalized parenchymal
volume loss. Suspected mild disproportionate ventricular prominence.
Patchy foci of T2 hyperintensity in the supratentorial white matter
are nonspecific but may reflect minor chronic microvascular ischemic
changes.

Vascular: Major vessel flow voids at the skull base are preserved.

Skull and upper cervical spine: Decreased and mildly heterogeneous
T1 marrow signal, which may reflect metastatic disease. Suggestion
of an expansile lesion of the right mandibular condyle.

Other: Minimal patchy mastoid fluid opacification. Sella is
unremarkable.

MRI ORBITS FINDINGS

Orbits: Asymmetric enhancement of the left optic nerve sheath.
Globes, extraocular muscles, and lacrimal glands are symmetric and
unremarkable.

Visualized sinuses: Minor mucosal thickening

Soft tissues: Unremarkable.
IMPRESSION: Abnormal dural-based enhancement in the left middle cranial fossa
extending posteriorly along the left tentorial insertion. Mild
underlying temporal lobe edema. Asymmetric enhancement along the
left optic nerve sheath likely reflects same process. Likely
reflects metastatic disease.

Subcentimeter enhancement along the left anterior frontal convexity
may be parenchymal or extra-axial. There is abnormal leptomeningeal
enhancement elsewhere also suspicious for metastatic disease.
Possible associated mild communicating hydrocephalus.

Abnormal marrow signal suspicious for osseous metastatic disease.
There is suspected extraosseous extension about the right mandibular
condyle.

## 2020-08-13 IMAGING — MR MR ORBITS WO/W CM
4 of 6 series · 18 of 48 positions shown · IV contrast (gadavist)
Comparison: None.

CLINICAL DATA: Blurry vision left eye, reported abnormal finding
eye doctor; history of breast cancer

EXAM:
MRI HEAD AND ORBITS WITHOUT AND WITH CONTRAST
TECHNIQUE: Multiplanar, multiecho pulse sequences of the brain and surrounding
structures were obtained without and with intravenous contrast.
Multiplanar, multiecho pulse sequences of the orbits and surrounding
structures were obtained including fat saturation techniques, before
and after intravenous contrast administration.
CONTRAST:  6mL GADAVIST GADOBUTROL 1 MMOL/ML IV SOLN

[Series 13: T2 fat-sat · axial · 3.0mm · 0.35mm/px · z∈[-56,-3]mm · 3 of 20 slices shown (1 of 2)]
[im 4/20]
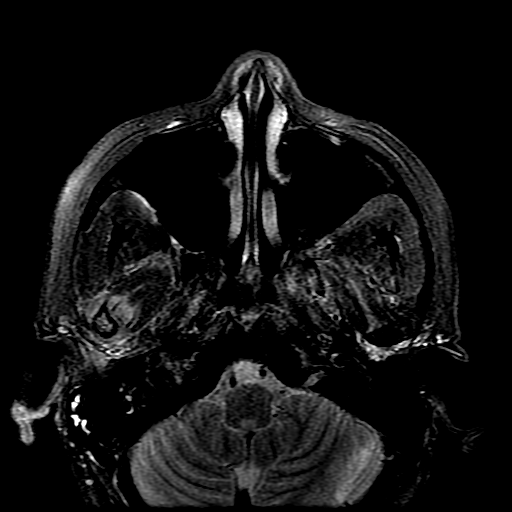
[im 12/20]
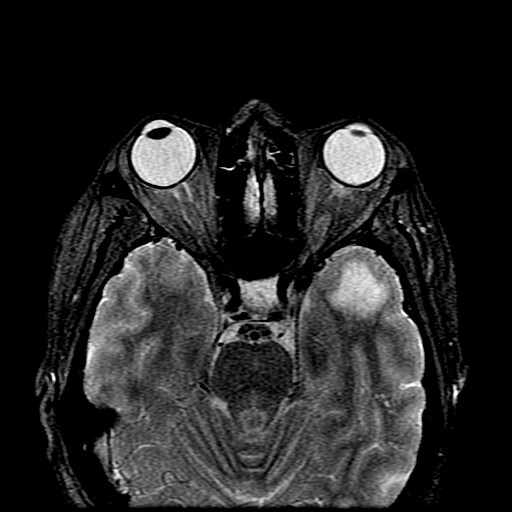
[im 20/20]
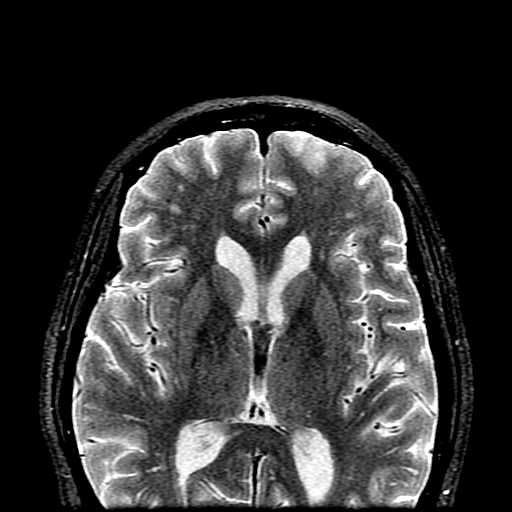

[Series 14: T2 fat-sat · coronal · 3.0mm · 0.35mm/px · 3 of 30 slices shown (2 of 2)]
[im 4/30]
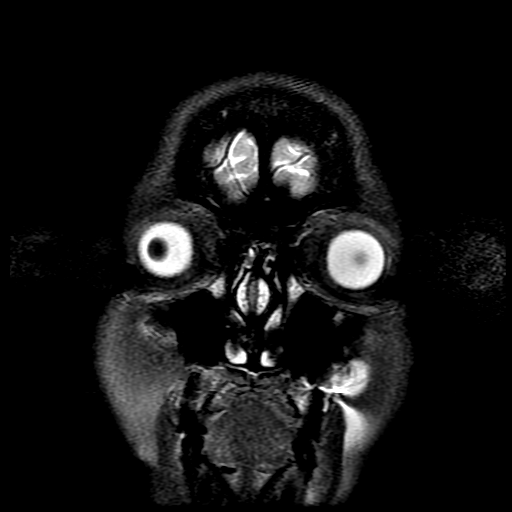
[im 17/30]
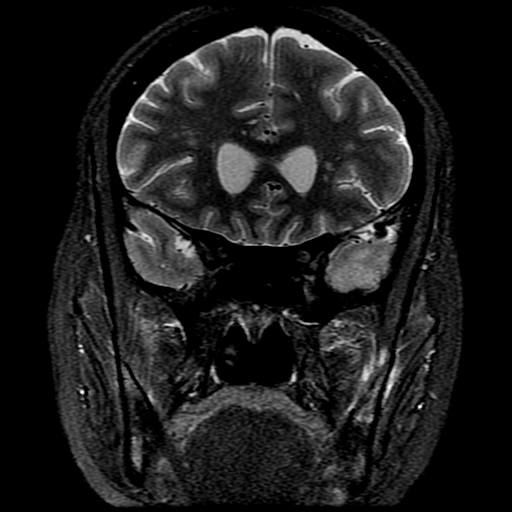
[im 26/30]
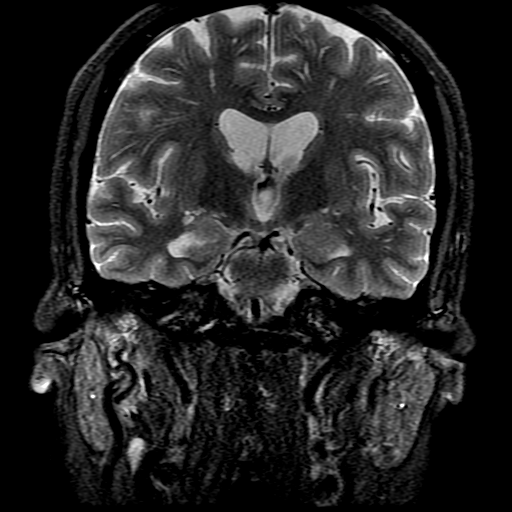

[Series 16: T1 post-contrast · axial · 3.0mm · 0.35mm/px · z∈[-66,-3]mm · 6 of 20 slices shown (1 of 2)]
[im 1/20]
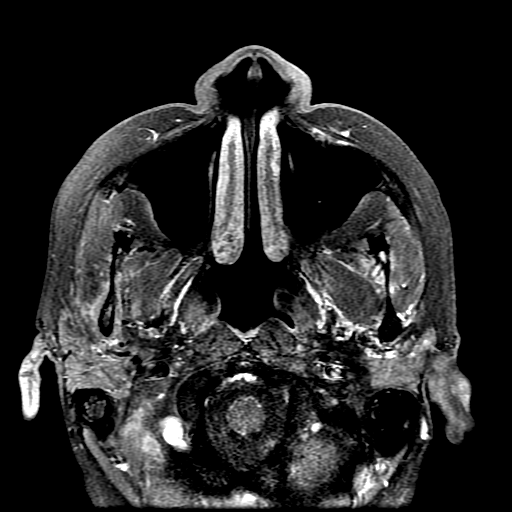
[im 4/20]
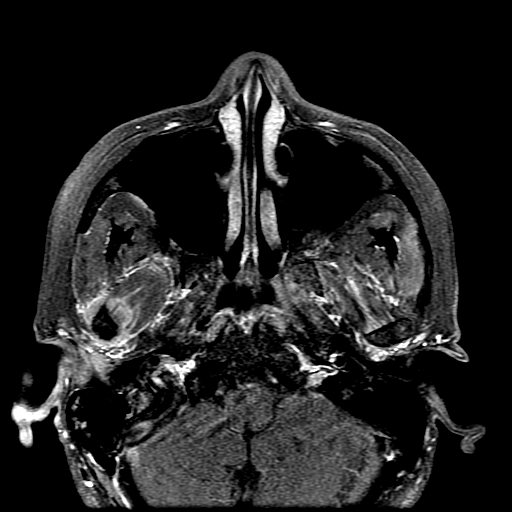
[im 8/20]
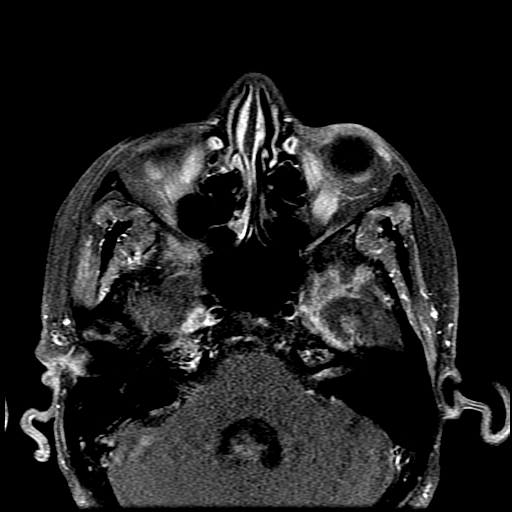
[im 12/20]
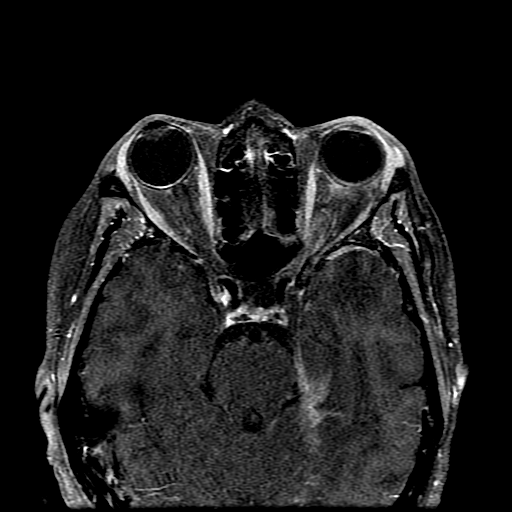
[im 16/20]
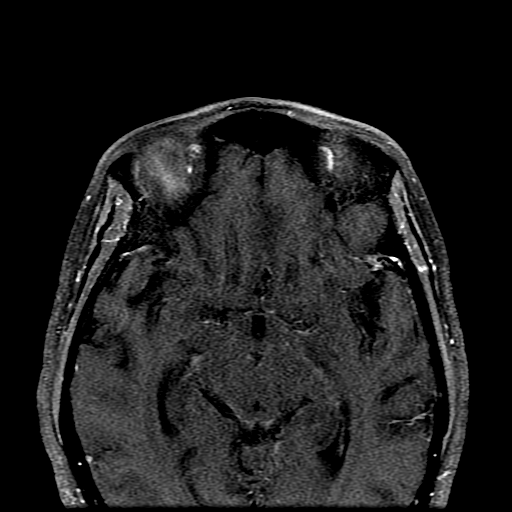
[im 20/20]
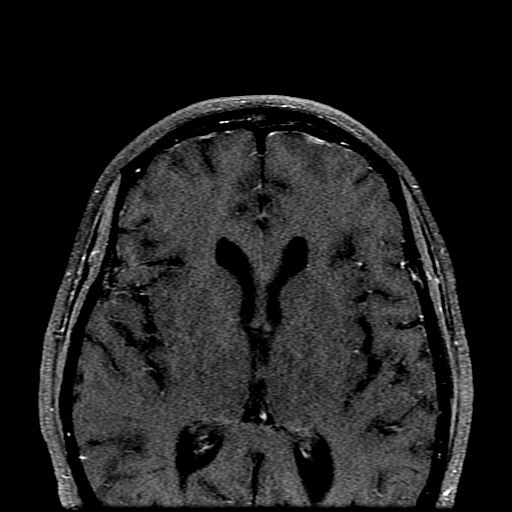

[Series 17: T1 post-contrast · coronal · 3.0mm · 0.35mm/px · 6 of 30 slices shown (2 of 2)]
[im 1/30]
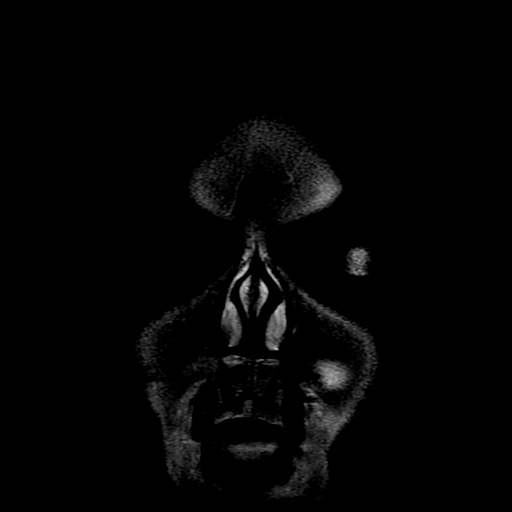
[im 4/30]
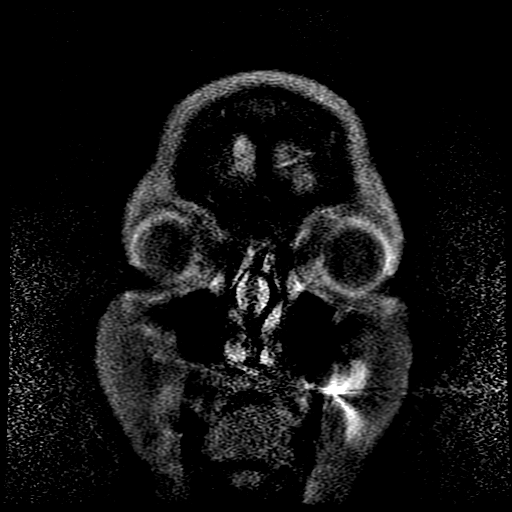
[im 10/30]
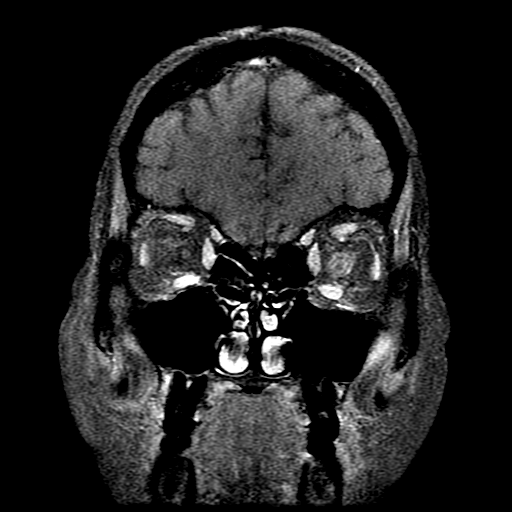
[im 13/30]
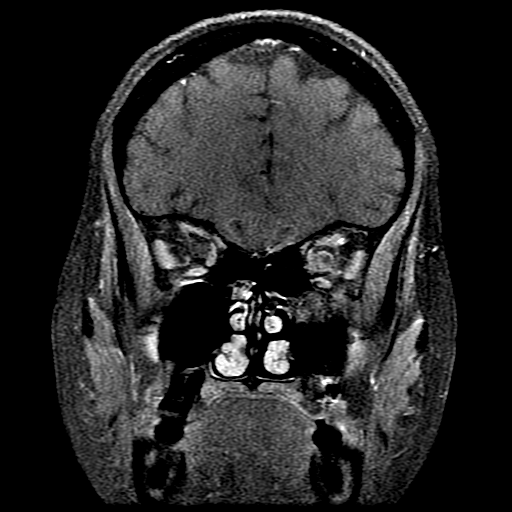
[im 17/30]
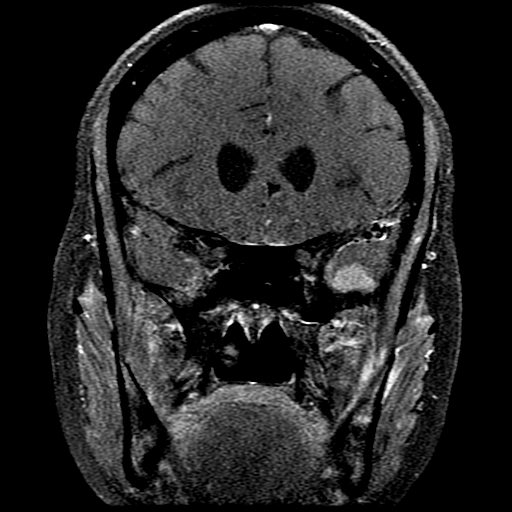
[im 26/30]
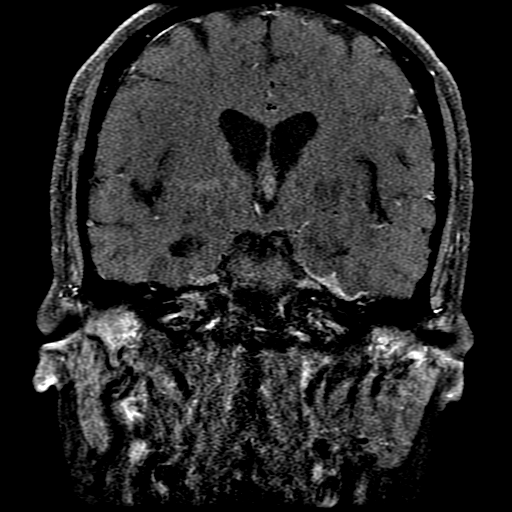

[18 of 48 positions shown; findings below may reference images not displayed]

FINDINGS: MRI HEAD FINDINGS

Brain: See series 16 for saved images. There is slightly nodular
dural-based enhancement along the left sphenoid bone and middle
cranial fossa. Mild edema is present in the underlying left temporal
lobe. Suspected adjacent sulcal leptomeningeal enhancement. Dural
enhancement extends posteriorly along the left tentorial insertion.
Much less prominent dural-based enhancement is also likely present
along the medial margin of the right middle cranial fossa.

There is abnormal enhancement within the internal auditory canals.
There is also abnormal enhancement along the trigeminal nerves.
Suspected abnormal leptomeningeal enhancement along the brainstem.

Enhancement measuring 9 x 7 mm along the left anterior frontal
convexity (series 16, image 19).

There is no acute infarction or intracranial hemorrhage. Prominence
of the ventricles and sulci reflects minor generalized parenchymal
volume loss. Suspected mild disproportionate ventricular prominence.
Patchy foci of T2 hyperintensity in the supratentorial white matter
are nonspecific but may reflect minor chronic microvascular ischemic
changes.

Vascular: Major vessel flow voids at the skull base are preserved.

Skull and upper cervical spine: Decreased and mildly heterogeneous
T1 marrow signal, which may reflect metastatic disease. Suggestion
of an expansile lesion of the right mandibular condyle.

Other: Minimal patchy mastoid fluid opacification. Sella is
unremarkable.

MRI ORBITS FINDINGS

Orbits: Asymmetric enhancement of the left optic nerve sheath.
Globes, extraocular muscles, and lacrimal glands are symmetric and
unremarkable.

Visualized sinuses: Minor mucosal thickening

Soft tissues: Unremarkable.
IMPRESSION: Abnormal dural-based enhancement in the left middle cranial fossa
extending posteriorly along the left tentorial insertion. Mild
underlying temporal lobe edema. Asymmetric enhancement along the
left optic nerve sheath likely reflects same process. Likely
reflects metastatic disease.

Subcentimeter enhancement along the left anterior frontal convexity
may be parenchymal or extra-axial. There is abnormal leptomeningeal
enhancement elsewhere also suspicious for metastatic disease.
Possible associated mild communicating hydrocephalus.

Abnormal marrow signal suspicious for osseous metastatic disease.
There is suspected extraosseous extension about the right mandibular
condyle.

## 2020-08-13 MED ORDER — LORAZEPAM 2 MG/ML IJ SOLN: 0.5000 mg | Freq: Once | INTRAMUSCULAR | Status: DC | PRN

## 2020-08-13 MED ORDER — GADOBUTROL 1 MMOL/ML IV SOLN
6.0000 mL | Freq: Once | INTRAVENOUS | Status: AC | PRN
  Administered 2020-08-13: 6 mL via INTRAVENOUS

## 2020-08-13 MED ORDER — OXYCODONE-ACETAMINOPHEN 5-325 MG PO TABS
1.0000 | ORAL_TABLET | Freq: Once | ORAL | Status: AC
  Administered 2020-08-13: 1 via ORAL
  Filled 2020-08-13: qty 1

## 2020-08-13 MED ORDER — DEXAMETHASONE 4 MG PO TABS: 4.0000 mg | ORAL_TABLET | Freq: Two times a day (BID) | ORAL | 0 refills | Status: AC

## 2020-08-13 NOTE — Discharge Instructions (Addendum)
Seen here for left blurry vision.  Imaging reveals masses in your brain concerning for metastases.  I prescribed you steroids please take as prescribed.  I recommend continue with your pain medications as prescribed.  You may take over-the-counter pain medications like ibuprofen every 6 as needed please follow dosing back of bottle.  It is important a follow-up with your oncologist for further evaluation.  Please call the office tomorrow morning at 8:30 so that you can be seen.  Come back to the emergency department if you develop chest pain, shortness of breath, severe abdominal pain, uncontrolled nausea, vomiting, diarrhea.

## 2020-08-13 NOTE — ED Provider Notes (Signed)
[facility] EMERGENCY DEPARTMENT Provider Note   CSN: [id number] Arrival date & time: 08/13/20  1150     History Chief Complaint  Patient presents with  . Blurred Vision    Abigail [name] is a 54 y.o. female.  HPI   Patient with significant medical history of breast cancer currently in remission, DVT in axillary artery of left upper extremity presents to the emergency department with  chief complaint of left visual blurriness. Patient states she got some soap in her eye about 1 week ago. She states her eye was hurting and she had some blurriness. She then went to her eye doctor for further evaluation. They noted that that she had swelling in her eye and had 90% visual loss of the left eye. They instructed her to come to the emergency department for further imaging. Patient denies painful eye loss, tunnel vision, recent falls, is not on anticoagulants,  Denies paresthesias or weakness in the upper or lower extremities. She states she wears glasses at nighttime to help with night driving but does not have any other eye abnormalities. Spoke with Shirleen Schirmer [facility] who saw the patient yesterday and noted that she has optic neuritis and is concerned for possible MS. He sent her here to have MRI of the brain and orbits for possible admission to the neuro hospitalist for further evaluation. Patient denies headaches, fevers, chills, shortness of breath, chest pain, dumping, nausea, vomiting, diarrhea, pedal edema.  Past Medical History:  Diagnosis Date  . Allergic rhinitis   . Hyperlipidemia    LDL 150's '11    Patient Active Problem List   Diagnosis Date Noted  . Genetic testing 05/01/2020  . Acute respiratory failure with hypoxia ([place]) 12/19/2019  . Port-A-Cath in place 11/14/2019  . Goals of care, counseling/discussion 11/02/2019  . Breast cancer ([place]) 11/02/2019  . HCAP (healthcare-associated pneumonia) 10/25/2019  . 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 Breast mass, left  10/25/2019  . DVT (deep vein thrombosis) in pregnancy 10/25/2019  . Hypokalemia 10/25/2019  . Acute deep vein thrombosis (DVT) of axillary vein of left upper extremity ([place]) 10/19/2019  . Left arm pain 09/10/2019  . Routine health maintenance 08/04/2011  . Borderline hyperlipidemia 06/05/2010    Past Surgical History:  Procedure Laterality Date  . CESAREAN SECTION    . IR IMAGING GUIDED PORT INSERTION  11/07/2019  . IR PERC PLEURAL DRAIN W/INDWELL CATH W/IMG GUIDE  12/20/2019  . IR REMOVAL OF PLURAL CATH W/CUFF  04/09/2020  . IR THORACENTESIS ASP PLEURAL SPACE W/IMG GUIDE  11/09/2019  . IR US GUIDE BX ASP/DRAIN  10/29/2019     OB History   No obstetric history on file.     Family History  Problem Relation Age of Onset  . Diabetes Mother   . Hypertension Mother   . Hypertension Father   . Hypertension Sister   . Heart disease Sister        MI - no serious damage to heart  . Cancer Neg Hx     Social History   Tobacco Use  . Smoking status: Never Smoker  . Smokeless tobacco: Never Used  Substance Use Topics  . Alcohol use: Yes    Comment: rare use - twice rare  . Drug use: No    Home Medications Prior to Admission medications   Medication Sig Start Date End Date Taking? Authorizing Provider  capecitabine (XELODA) 500 MG tablet Take 1500 mg AM, Take 1500 mg PM for total dose of  3000 mg daily, Take for 14 days on and 7 days off. Repeat every 21 days 08/06/20   Ladell Pier, MD  dexamethasone (DECADRON) 4 MG tablet Take 1 tablet (4 mg total) by mouth 2 (two) times daily with a meal for 10 days. 08/13/20 08/23/20  Marcello Fennel, PA-C  HYDROcodone-acetaminophen (NORCO/VICODIN) 5-325 MG tablet Take 1 tablet by mouth every 8 (eight) hours as needed for moderate pain or severe pain. 08/06/20   Owens Shark, NP  lidocaine-prilocaine (EMLA) cream Apply 1 application topically as directed. Apply to port 1 hour prior to stick and cover with plastic wrap 03/26/20   Owens Shark, NP  ondansetron (ZOFRAN) 8 MG tablet Take 1 tablet (8 mg total) by mouth every 8 (eight) hours as needed for nausea or vomiting. Patient not taking: Reported on 04/16/2020 11/02/19   Ladell Pier, MD  potassium chloride SA (KLOR-CON) 20 MEQ tablet Take 1 tablet (20 mEq total) by mouth daily. Patient not taking: Reported on 02/13/2020 12/12/19   Ladell Pier, MD  rivaroxaban (XARELTO) 20 MG TABS tablet Take 1 tablet (20 mg total) by mouth daily with supper. 02/13/20   Ladell Pier, MD    Allergies    Shellfish allergy and Sulfamethoxazole  Review of Systems   Review of Systems  Constitutional: Negative for chills and fever.  HENT: Negative for congestion, trouble swallowing and voice change.   Eyes: Positive for visual disturbance. Negative for photophobia, pain and redness.  Respiratory: Negative for cough and shortness of breath.   Cardiovascular: Negative for chest pain.  Gastrointestinal: Negative for abdominal pain, diarrhea, nausea and vomiting.  Genitourinary: Negative for enuresis, flank pain and frequency.  Musculoskeletal: Negative for back pain.  Skin: Negative for rash.  Neurological: Negative for dizziness and headaches.  Hematological: Does not bruise/bleed easily.    Physical Exam Updated Vital Signs BP 116/85   Pulse (!) 118   Temp 98.4 F (36.9 C) (Oral)   Resp 15   Ht 5\' 1"  (1.549 m)   Wt 60.8 kg   SpO2 98%   BMI 25.32 kg/m   Physical Exam Vitals and nursing note reviewed.  Constitutional:      General: She is not in acute distress.    Appearance: Normal appearance. She is not ill-appearing or diaphoretic.  HENT:     Head: Normocephalic and atraumatic.     Nose: No congestion or rhinorrhea.     Mouth/Throat:     Mouth: Mucous membranes are moist.     Pharynx: Oropharynx is clear.  Eyes:     General: Visual field deficit present. No scleral icterus.       Right eye: No discharge.        Left eye: No discharge.     Extraocular Movements:  Extraocular movements intact.     Conjunctiva/sclera: Conjunctivae normal.     Pupils: Pupils are equal, round, and reactive to light.  Cardiovascular:     Rate and Rhythm: Normal rate and regular rhythm.     Pulses: Normal pulses.     Heart sounds: No murmur heard.  No friction rub. No gallop.   Pulmonary:     Effort: Pulmonary effort is normal. No respiratory distress.     Breath sounds: No wheezing, rhonchi or rales.  Abdominal:     General: There is no distension.     Palpations: Abdomen is soft.     Tenderness: There is no abdominal tenderness. There is no right  CVA tenderness, left CVA tenderness or guarding.  Musculoskeletal:        General: No swelling or tenderness.     Right lower leg: No edema.     Left lower leg: No edema.  Skin:    General: Skin is warm and dry.     Findings: No rash.  Neurological:     General: No focal deficit present.     Mental Status: She is alert and oriented to person, place, and time.     GCS: GCS eye subscore is 4. GCS verbal subscore is 5. GCS motor subscore is 6.     Cranial Nerves: No cranial nerve deficit or facial asymmetry.     Sensory: Sensation is intact. No sensory deficit.     Motor: Weakness present. No pronator drift.     Coordination: Coordination is intact. Romberg sign negative. Finger-Nose-Finger Test and Heel to [place] Test normal.     Comments: Patient had noted visual changes in her left eye, unable to see fingers when right eye is covered. EOMs fully intact.  Patient had noted left upper extremity weakness, she has full range of motion neurovascular fully intact.  Psychiatric:        Mood and Affect: Mood normal.     ED Results / Procedures / Treatments   Labs (all labs ordered are listed, but only abnormal results are displayed) Labs Reviewed  CBC - Abnormal; Notable for the following components:      Result Value   RBC 3.55 (*)    Hemoglobin 10.6 (*)    HCT 35.1 (*)    RDW 18.9 (*)    All other components  within normal limits  BASIC METABOLIC PANEL - Abnormal; Notable for the following components:   Potassium 3.2 (*)    Chloride 93 (*)    Glucose, Bld 119 (*)    All other components within normal limits  DIFFERENTIAL - Abnormal; Notable for the following components:   Lymphs Abs 0.6 (*)    All other components within normal limits  APTT - Abnormal; Notable for the following components:   aPTT 23 (*)    All other components within normal limits  I-STAT BETA HCG BLOOD, ED ([facility], [facility], [facility] ONLY) - Abnormal; Notable for the following components:   I-stat hCG, quantitative 10.1 (*)    All other components within normal limits  RESP PANEL BY RT PCR (RSV, FLU A&B, COVID)  PROTIME-INR  RAPID URINE DRUG SCREEN, HOSP PERFORMED  URINALYSIS, ROUTINE W REFLEX MICROSCOPIC    EKG EKG Interpretation  Date/Time:  Wednesday August 13 2020 14:27:31 EDT Ventricular Rate:  109 PR Interval:    QRS Duration: 78 QT Interval:  332 QTC Calculation: 447 R Axis:   35 Text Interpretation: Sinus tachycardia Low voltage, precordial leads Borderline T abnormalities, anterior leads since last tracing no significant change Confirmed by Daleen Bo [phone]) on 08/13/2020 4:53:17 PM   Radiology MR Brain W and Wo Contrast  Result Date: 08/13/2020 CLINICAL DATA:  Blurry vision left eye, reported abnormal finding eye doctor; history of breast cancer EXAM: MRI HEAD AND ORBITS WITHOUT AND WITH CONTRAST TECHNIQUE: Multiplanar, multiecho pulse sequences of the brain and surrounding structures were obtained without and with intravenous contrast. Multiplanar, multiecho pulse sequences of the orbits and surrounding structures were obtained including fat saturation techniques, before and after intravenous contrast administration. CONTRAST:  25mL GADAVIST GADOBUTROL 1 MMOL/ML IV SOLN COMPARISON:  None. FINDINGS: MRI HEAD FINDINGS Brain: See series 16 for saved images.  There is slightly nodular dural-based enhancement along the  left sphenoid bone and middle cranial fossa. Mild edema is present in the underlying left temporal lobe. Suspected adjacent sulcal leptomeningeal enhancement. Dural enhancement extends posteriorly along the left tentorial insertion. Much less prominent dural-based enhancement is also likely present along the medial margin of the right middle cranial fossa. There is abnormal enhancement within the internal auditory canals. There is also abnormal enhancement along the trigeminal nerves. Suspected abnormal leptomeningeal enhancement along the brainstem. Enhancement measuring 9 x 7 mm along the left anterior frontal convexity (series 16, image 19). There is no acute infarction or intracranial hemorrhage. Prominence of the ventricles and sulci reflects minor generalized parenchymal volume loss. Suspected mild disproportionate ventricular prominence. Patchy foci of T2 hyperintensity in the supratentorial white matter are nonspecific but may reflect minor chronic microvascular ischemic changes. Vascular: Major vessel flow voids at the skull base are preserved. Skull and upper cervical spine: Decreased and mildly heterogeneous T1 marrow signal, which may reflect metastatic disease. Suggestion of an expansile lesion of the right mandibular condyle. Other: Minimal patchy mastoid fluid opacification. Sella is unremarkable. MRI ORBITS FINDINGS Orbits: Asymmetric enhancement of the left optic nerve sheath. Globes, extraocular muscles, and lacrimal glands are symmetric and unremarkable. Visualized sinuses: Minor mucosal thickening Soft tissues: Unremarkable. IMPRESSION: Abnormal dural-based enhancement in the left middle cranial fossa extending posteriorly along the left tentorial insertion. Mild underlying temporal lobe edema. Asymmetric enhancement along the left optic nerve sheath likely reflects same process. Likely reflects metastatic disease. Subcentimeter enhancement along the left anterior frontal convexity may be  parenchymal or extra-axial. There is abnormal leptomeningeal enhancement elsewhere also suspicious for metastatic disease. 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 Abnormal marrow signal suspicious for osseous metastatic disease. There is suspected extraosseous extension about the right mandibular condyle. Electronically Signed   By: Macy Mis M.D.   On: 08/13/2020 16:59   MR ORBITS W WO CONTRAST  Result Date: 08/13/2020 CLINICAL DATA:  Blurry vision left eye, reported abnormal finding eye doctor; history of breast cancer EXAM: MRI HEAD AND ORBITS WITHOUT AND WITH CONTRAST TECHNIQUE: Multiplanar, multiecho pulse sequences of the brain and surrounding structures were obtained without and with intravenous contrast. Multiplanar, multiecho pulse sequences of the orbits and surrounding structures were obtained including fat saturation techniques, before and after intravenous contrast administration. CONTRAST:  22mL GADAVIST GADOBUTROL 1 MMOL/ML IV SOLN COMPARISON:  None. FINDINGS: MRI HEAD FINDINGS Brain: See series 16 for saved images. There is slightly nodular dural-based enhancement along the left sphenoid bone and middle cranial fossa. Mild edema is present in the underlying left temporal lobe. Suspected adjacent sulcal leptomeningeal enhancement. Dural enhancement extends posteriorly along the left tentorial insertion. Much less prominent dural-based enhancement is also likely present along the medial margin of the right middle cranial fossa. There is abnormal enhancement within the internal auditory canals. There is also abnormal enhancement along the trigeminal nerves. Suspected abnormal leptomeningeal enhancement along the brainstem. Enhancement measuring 9 x 7 mm along the left anterior frontal convexity (series 16, image 19). There is no acute infarction or intracranial hemorrhage. Prominence of the ventricles and sulci reflects minor generalized parenchymal volume loss. Suspected mild  disproportionate ventricular prominence. Patchy foci of T2 hyperintensity in the supratentorial white matter are nonspecific but may reflect minor chronic microvascular ischemic changes. Vascular: Major vessel flow voids at the skull base are preserved. Skull and upper cervical spine: Decreased and mildly heterogeneous T1 marrow signal, which may reflect metastatic disease. Suggestion of an expansile lesion of  the right mandibular condyle. Other: Minimal patchy mastoid fluid opacification. Sella is unremarkable. MRI ORBITS FINDINGS Orbits: Asymmetric enhancement of the left optic nerve sheath. Globes, extraocular muscles, and lacrimal glands are symmetric and unremarkable. Visualized sinuses: Minor mucosal thickening Soft tissues: Unremarkable. IMPRESSION: Abnormal dural-based enhancement in the left middle cranial fossa extending posteriorly along the left tentorial insertion. Mild underlying temporal lobe edema. Asymmetric enhancement along the left optic nerve sheath likely reflects same process. Likely reflects metastatic disease. Subcentimeter enhancement along the left anterior frontal convexity may be parenchymal or extra-axial. There is abnormal leptomeningeal enhancement elsewhere also suspicious for metastatic disease. 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 Abnormal marrow signal suspicious for osseous metastatic disease. There is suspected extraosseous extension about the right mandibular condyle. Electronically Signed   By: Macy Mis M.D.   On: 08/13/2020 16:59    Procedures Procedures (including critical care time)  Medications Ordered in ED Medications  LORazepam (ATIVAN) injection 0.5 mg (0 mg Intravenous Hold 08/13/20 1815)  gadobutrol (GADAVIST) 1 MMOL/ML injection 6 mL (6 mLs Intravenous Contrast Given 08/13/20 1633)  oxyCODONE-acetaminophen (PERCOCET/ROXICET) 5-325 MG per tablet 1 tablet (1 tablet Oral Given 08/13/20 1822)    ED Course  I have reviewed the triage  vital signs and the nursing notes.  Pertinent labs & imaging results that were available during my care of the patient were reviewed by me and considered in my medical decision making (see chart for details).    MDM Rules/Calculators/A&P                          I have personally reviewed all imaging, labs and have interpreted them.  Patient presents with left-sided blurred vision. She is alert, did not appear acute distress, vital signs reassuring. Will order stroke lab work-up, proceed with MRI of brain and orbits for further evaluation.  CBC negative for leukocytosis, with normocytic anemia appears to be a baseline for patient.  BMP shows slight hypokalemia 3.2, no metabolic acidosis, hyperglycemia 119, no anion gap or AKI noted.  Respiratory panel negative for Covid, influenza A/B, no RSV.  HCG 10.1, APTT 23, prothrombin time 15.1, INR 1.2. MRI of brain and orbits shows possible metastatic disease along the optic nerve sheaths.  Also shows enhancement along the left anterior ffrontal may be parenchymal or extra axonal.  Possible metastatic disease also noted along the  Leptomeningeal.  Abnormal marrow signal suspicious for osseous possible metastasis disease.  Due to concerns of new metastases within the brain will consult with oncology for further recommendation.Spoke with Dr. Alvy Bimler of oncology who feels as long as there is no midline shift and no focal deficits and patient wants to go home she can be seen outpatient early tomorrow.  She does recommend starting dexamethasone 2 times daily for next 10 days.  She will contact Dr.Sherrill Dominica Severin patient's oncologist and she will come in tomorrow.  Low suspicion for intracranial head bleed or CVA as there is no neuro deficit on exam,, no signs of infarction noted on MRI.  Suspect slight left upper extremity weakness secondary to DVT.  Low suspicion for acute glaucoma or optic neuritis as MRI of orbits does not reveal lesions on the optic nerve.   Low suspicion for systemic infection as patient is nontoxic-appearing, vital signs reassuring, no obvious infection on exam, no leukocytosis seen on CBC.  Low suspicion for hyperglycemia or DKA patient is a nondiabetic, she does not have an elevated glucose level.  Unfortunately I suspect  patient's decrease in vision is most likely caused by metastatic cancer.  Will start patient on steroids as directed by Dr.Gorsuch and have her follow-up with her oncologist tomorrow morning at 8:30 AM.  Vital signs have remained stable, no indication for hospital admission.  Patient discussed with attending and they agreed with assessment and plan.  Patient given at home care as well strict return precautions.  Patient verbalized that they understood agreed to said plan.  Final Clinical Impression(s) / ED Diagnoses Final diagnoses:  Blurred vision    Rx / DC Orders ED Discharge Orders         Ordered    dexamethasone (DECADRON) 4 MG tablet  2 times daily with meals        08/13/20 1832           Marcello Fennel, PA-C 08/13/20 1857    Daleen Bo, MD 08/13/20 2206

## 2020-08-13 NOTE — ED Notes (Signed)
Please notify patients sisters Lelan Pons and Darcel when back to a room.

## 2020-08-13 NOTE — ED Notes (Addendum)
Pt off floor for MRI

## 2020-08-13 NOTE — ED Notes (Signed)
Pt seen by PCP and ophthalmology r/t to left eye visual loss. Pt states she got shampoo in her eye a week ago.  Pt sent here by ophthalmology for further testing. Pt reports she was told there is swelling in left eye. Pt denies any other injury to eye.  Does not have any other complaints at this time.

## 2020-08-13 NOTE — ED Provider Notes (Signed)
  Face-to-face evaluation   History: Decreased left eye vision for 1 week.  She is actively being treated for breast cancer.  Physical exam: Alert, calm, cooperative.  No dysarthria or aphasia.  She does not appear uncomfortable.  Medical screening examination/treatment/procedure(s) were conducted as a shared visit with non-physician practitioner(s) and myself.  I personally evaluated the patient during the encounter    Daleen Bo, MD 08/13/20 2205

## 2020-08-13 NOTE — ED Notes (Signed)
Pt discharged via wheelchair with family. All questions and concerns addressed. No complaints at this time.

## 2020-08-13 NOTE — ED Triage Notes (Addendum)
Patient arrives to ED with reports of blurred vision in her left eye x1 week after getting shampoo in it. Has seen her eye doctor this past Friday and Monday and they noticed some swelling in the eye. She was sent here for MRI.

## 2020-08-13 NOTE — Telephone Encounter (Addendum)
Called patient and she reports she is going to the hospital today (did not want to go last night). No change in her loss of vision.  Called [facility] and left VM for new patient coordinator w/request to reschedule today's appointment to next week due to acute issue with her eye. Left direct # for return call. Return call from [facility]: Rescheduled to see Dr. Rondel Oh on 09/03/20 @  2 pm with 1:30 pm arrival. Will notify patient when her eye issue is addressed.

## 2020-08-14 ENCOUNTER — Ambulatory Visit: Payer: 59 | Admitting: Nurse Practitioner

## 2020-08-14 ENCOUNTER — Encounter: Payer: Self-pay | Admitting: Radiation Oncology

## 2020-08-14 ENCOUNTER — Telehealth: Payer: Self-pay | Admitting: *Deleted

## 2020-08-14 ENCOUNTER — Other Ambulatory Visit: Payer: Self-pay | Admitting: Radiation Therapy

## 2020-08-14 ENCOUNTER — Other Ambulatory Visit: Payer: Self-pay | Admitting: Radiation Oncology

## 2020-08-14 ENCOUNTER — Inpatient Hospital Stay
Admission: RE | Admit: 2020-08-14 | Discharge: 2020-08-14 | Disposition: A | Discharge status: Home / Self Care | Payer: 59 | Source: Ambulatory Visit | Attending: Radiation Oncology | Admitting: Radiation Oncology

## 2020-08-14 ENCOUNTER — Ambulatory Visit
Admission: RE | Admit: 2020-08-14 | Discharge: 2020-08-14 | Disposition: A | Discharge status: Home / Self Care | Payer: 59 | Source: Ambulatory Visit | Attending: Radiation Oncology | Admitting: Radiation Oncology

## 2020-08-14 ENCOUNTER — Other Ambulatory Visit: Payer: Self-pay

## 2020-08-14 ENCOUNTER — Ambulatory Visit: Admission: RE | Admit: 2020-08-14 | Payer: 59 | Source: Ambulatory Visit | Admitting: Radiation Oncology

## 2020-08-14 ENCOUNTER — Ambulatory Visit: Payer: 59

## 2020-08-14 DIAGNOSIS — G96198 Other disorders of meninges, not elsewhere classified: Secondary | ICD-10-CM

## 2020-08-14 DIAGNOSIS — C50412 Malignant neoplasm of upper-outer quadrant of left female breast: Secondary | ICD-10-CM

## 2020-08-14 DIAGNOSIS — C50912 Malignant neoplasm of unspecified site of left female breast: Secondary | ICD-10-CM | POA: Insufficient documentation

## 2020-08-14 DIAGNOSIS — C7931 Secondary malignant neoplasm of brain: Secondary | ICD-10-CM

## 2020-08-14 DIAGNOSIS — C50919 Malignant neoplasm of unspecified site of unspecified female breast: Secondary | ICD-10-CM | POA: Insufficient documentation

## 2020-08-14 DIAGNOSIS — C7949 Secondary malignant neoplasm of other parts of nervous system: Secondary | ICD-10-CM | POA: Insufficient documentation

## 2020-08-14 DIAGNOSIS — Z171 Estrogen receptor negative status [ER-]: Secondary | ICD-10-CM | POA: Insufficient documentation

## 2020-08-14 MED ORDER — LORAZEPAM 0.5 MG PO TABS: ORAL_TABLET | ORAL | 0 refills | Status: DC

## 2020-08-14 MED ORDER — LORAZEPAM 1 MG PO TABS
0.5000 mg | ORAL_TABLET | Freq: Once | ORAL | Status: AC
  Administered 2020-08-14: 0.5 mg via ORAL
  Filled 2020-08-14: qty 0.5

## 2020-08-14 MED ORDER — MEMANTINE HCL 10 MG PO TABS: 10.0000 mg | ORAL_TABLET | Freq: Two times a day (BID) | ORAL | 4 refills | Status: AC

## 2020-08-14 MED ORDER — MEMANTINE HCL 5 MG PO TABS: ORAL_TABLET | ORAL | 0 refills | Status: DC

## 2020-08-14 NOTE — Progress Notes (Addendum)
Location/Histology of Brain Tumor: Leptomeningeal disease  Patient presented with symptoms of:  Decreased left eye vision for about a week.   MRI Brain 08/13/2020: Abnormal dural-based enhancement in the left middle cranial fossa extending posteriorly along the left tentorial insertion. Mild underlying temporal lobe edema. Asymmetric enhancement along the left optic nerve sheath likely reflects same process. Likely reflects metastatic disease.  Subcentimeter enhancement along the left anterior frontal convexity may be parenchymal or extra-axial. There is abnormal leptomeningeal enhancement elsewhere also suspicious for metastatic disease. 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  Abnormal marrow signal suspicious for osseous metastatic disease. There is suspected extraosseous extension about the right mandibular Condyle.  Past or anticipated interventions, if any, per neurosurgery:   Past or anticipated interventions, if any, per medical oncology:  Dr. Benay Spice -Breast cancer treatment -Completed 5 cycles of xeloda -2nd opinion at [facility] with Dr. Rondel Oh- appt unscheduled at this time.  Dose of Decadron, if applicable: 4 mg BID  Recent neurologic symptoms, if any:   Seizures: no  Headaches: Has had mild headaches this week.  Nausea: Has some nausea this week.  Dizziness/ataxia: moving slow, using cane.  Difficulty with hand coordination: Left hand  Focal numbness/weakness: No numbness in right extremity or left leg.  Has weakness on left side.  Visual deficits/changes: She states she cannot see out of her left eye.  Right vision unchanged.  Confusion/Memory deficits: No   SAFETY ISSUES:  Prior radiation? no  Pacemaker/ICD? no  Possible current pregnancy? Last cycle over 2 years ago.  Perimenopause  Is the patient on methotrexate? No  Additional Complaints / other details:  Has [facility]

## 2020-08-14 NOTE — Progress Notes (Addendum)
Radiation Oncology         [phone] ________________________________  Initial Outpatient Consultation - Conducted via telephone due to current COVID-19 concerns for limiting patient exposure  I spoke with the patient to conduct this consult visit via telephone to spare the patient unnecessary potential exposure in the healthcare setting during the current COVID-19 pandemic. The patient was notified in advance and was offered a [place] meeting to allow for face to face communication but unfortunately reported that they did not have the appropriate resources/technology to support such a visit and instead preferred to proceed with a telephone consult.     Name: Abigail [name]        MRN: [id number]  Date of Service: 08/14/2020 DOB: [birth year]  KT:[id number], Real Cons, MD  Ladell Pier, MD     REFERRING PHYSICIAN: Ladell Pier, MD   DIAGNOSIS: The primary encounter diagnosis was Malignant neoplasm of upper-outer quadrant of left breast in female, estrogen receptor negative ([place]). 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   HISTORY OF PRESENT ILLNESS: Abigail [name] is a 54 y.o. female seen at the request of Dr. Benay Spice for a history of metastatic breast cancer.  The patient originally presented in January 2021 with a large left breast mass axillary adenopathy and concerns for bone disease as well as a left pleural effusion and mediastinal adenopathy.  She also had liver disease and diffuse bone disease.  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  She began systemic chemotherapy on 11/14/2019, and had improvement in her disease overall by imaging.  She was able to transition to Xeloda in August 2021 however this was discontinued in October due to concerns for disease progression.  She started developing some blurriness in her left eye about a week ago after  showering and getting soap in her eye.  Since that time she is gradually lost all vision in her left eye and is only very minimally able to distinguish light from dark.  Given the symptoms, she was evaluated yesterday evening in the emergency department and underwent an MRI of the brain with and without contrast as well as MRI of the orbits.  Her MRI brain revealed nodular based enhancement in the left sphenoid bone middle cranial fossa and associated mild edema was seen in the left temporal lobe.  There was concern for leptomeningeal disease and dural enhancement extended posteriorly along the left tentorial insertion.  There was also abnormal enhancement within the internal auditory canals along the trigeminal nerves and suspected leptomeningeal enhancement along the brainstem.  There is also enhancement measuring 9 x 7 along the left anterior frontal convexity, mild disproportionate ventricular prominence and patchy foci of T2 hyperintensity in the supratentorial white matter probably possible minor chronic microvascular ischemic change.  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  Given these findings she is seen today urgently to consider whole brain radiation.     PREVIOUS RADIATION THERAPY: No   PAST MEDICAL HISTORY:  Past Medical  History:  Diagnosis Date  . Allergic rhinitis   . Hyperlipidemia    LDL 150's '11       PAST SURGICAL HISTORY: Past Surgical History:  Procedure  Laterality Date  . CESAREAN SECTION    . IR IMAGING GUIDED PORT INSERTION  11/07/2019  . IR PERC PLEURAL DRAIN W/INDWELL CATH W/IMG GUIDE  12/20/2019  . IR REMOVAL OF PLURAL CATH W/CUFF  04/09/2020  . IR THORACENTESIS ASP PLEURAL SPACE W/IMG GUIDE  11/09/2019  . IR US GUIDE BX ASP/DRAIN  10/29/2019     FAMILY HISTORY:  Family History  Problem Relation Age of Onset  . Diabetes Mother   . Hypertension Mother   . Hypertension Father   . Hypertension Sister   . Heart disease Sister        MI - no serious damage to heart  . Cancer Neg Hx      SOCIAL HISTORY:  reports that she has never smoked. She has never used smokeless tobacco. She reports current alcohol use. She reports that she does not use drugs.   ALLERGIES: Shellfish allergy and Sulfamethoxazole   MEDICATIONS:  Current Outpatient Medications  Medication Sig Dispense Refill  . capecitabine (XELODA) 500 MG tablet Take 1500 mg AM, Take 1500 mg PM for total dose of 3000 mg daily, Take for 14 days on and 7 days off. Repeat every 21 days 84 tablet 0  . dexamethasone (DECADRON) 4 MG tablet Take 1 tablet (4 mg total) by mouth 2 (two) times daily with a meal for 10 days. 20 tablet 0  . HYDROcodone-acetaminophen (NORCO/VICODIN) 5-325 MG tablet Take 1 tablet by mouth every 8 (eight) hours as needed for moderate pain or severe pain. 50 tablet 0  . lidocaine-prilocaine (EMLA) cream Apply 1 application topically as directed. Apply to port 1 hour prior to stick and cover with plastic wrap 30 g 5  . rivaroxaban (XARELTO) 20 MG TABS tablet Take 1 tablet (20 mg total) by mouth daily with supper. 90 tablet 1  . ondansetron (ZOFRAN) 8 MG tablet Take 1 tablet (8 mg total) by mouth every 8 (eight) hours as needed for nausea or vomiting. (Patient not taking: Reported on 04/16/2020) 30 tablet 1  . potassium chloride SA (KLOR-CON) 20 MEQ tablet Take 1 tablet (20 mEq total) by mouth daily. (Patient not taking: Reported on 02/13/2020) 30 tablet 1   No  current facility-administered medications for this encounter.     REVIEW OF SYSTEMS: On review of systems, the patient reports that she is doing okay overall but has had episodes of much more significant fatigue, she has been unable to see out of her left eye and again reviews with me that she only has very mild distinguishing abilities to distinguish light and dark.  She is also had headaches that have increased in intensity in the last week.  She has not been able to start oral dexamethasone yet but plans to have this picked up by one of her family members today.  No other specific complaints are verbalized.  PHYSICAL EXAM:  Wt Readings from Last 3 Encounters:  08/13/20 134 lb (60.8 kg)  08/05/20 136 lb 14.4 oz (62.1 kg)  07/11/20 144 lb 1.6 oz (65.4 kg)  Unable to assess given encounter type.  ECOG = 2  0 - Asymptomatic (Fully active, able to carry on all predisease activities without restriction)  1 - Symptomatic but completely ambulatory (Restricted in physically strenuous activity but ambulatory and able to carry out work of  a light or sedentary nature. For example, light housework, office work)  2 - Symptomatic, <50% in bed during the day (Ambulatory and capable of all self care but unable to carry out any work activities. Up and about more than 50% of waking hours)  3 - Symptomatic, >50% in bed, but not bedbound (Capable of only limited self-care, confined to bed or chair 50% or more of waking hours)  4 - Bedbound (Completely disabled. Cannot carry on any self-care. Totally confined to bed or chair)  5 - Death   Eustace Pen MM, Creech RH, Tormey DC, et al. [phone]). "Toxicity and response criteria of the [facility]". [place] Oncol. 5 (6): 649-55    LABORATORY DATA:  Lab Results  Component Value Date   WBC 7.0 08/13/2020   HGB 10.6 (L) 08/13/2020   HCT 35.1 (L) 08/13/2020   MCV 98.9 08/13/2020   PLT 185 08/13/2020   Lab Results  Component Value Date    NA 135 08/13/2020   K 3.2 (L) 08/13/2020   CL 93 (L) 08/13/2020   CO2 29 08/13/2020   Lab Results  Component Value Date   ALT 27 08/05/2020   AST 28 08/05/2020   ALKPHOS 113 08/05/2020   BILITOT 0.8 08/05/2020      RADIOGRAPHY: MR Brain W and Wo Contrast  Result Date: 08/13/2020 CLINICAL DATA:  Blurry vision left eye, reported abnormal finding eye doctor; history of breast cancer EXAM: MRI HEAD AND ORBITS WITHOUT AND WITH CONTRAST TECHNIQUE: Multiplanar, multiecho pulse sequences of the brain and surrounding structures were obtained without and with intravenous contrast. Multiplanar, multiecho pulse sequences of the orbits and surrounding structures were obtained including fat saturation techniques, before and after intravenous contrast administration. CONTRAST:  43m GADAVIST GADOBUTROL 1 MMOL/ML IV SOLN COMPARISON:  None. FINDINGS: MRI HEAD FINDINGS Brain: See series 16 for saved images. There is slightly nodular dural-based enhancement along the left sphenoid bone and middle cranial fossa. Mild edema is present in the underlying left temporal lobe. Suspected adjacent sulcal leptomeningeal enhancement. Dural enhancement extends posteriorly along the left tentorial insertion. Much less prominent dural-based enhancement is also likely present along the medial margin of the right middle cranial fossa. There is abnormal enhancement within the internal auditory canals. There is also abnormal enhancement along the trigeminal nerves. Suspected abnormal leptomeningeal enhancement along the brainstem. Enhancement measuring 9 x 7 mm along the left anterior frontal convexity (series 16, image 19). There is no acute infarction or intracranial hemorrhage. Prominence of the ventricles and sulci reflects minor generalized parenchymal volume loss. Suspected mild disproportionate ventricular prominence. Patchy foci of T2 hyperintensity in the supratentorial white matter are nonspecific but may reflect minor  chronic microvascular ischemic changes. Vascular: Major vessel flow voids at the skull base are preserved. Skull and upper cervical spine: Decreased and mildly heterogeneous T1 marrow signal, which may reflect metastatic disease. Suggestion of an expansile lesion of the right mandibular condyle. Other: Minimal patchy mastoid fluid opacification. Sella is unremarkable. MRI ORBITS FINDINGS Orbits: Asymmetric enhancement of the left optic nerve sheath. Globes, extraocular muscles, and lacrimal glands are symmetric and unremarkable. Visualized sinuses: Minor mucosal thickening Soft tissues: Unremarkable. IMPRESSION: Abnormal dural-based enhancement in the left middle cranial fossa extending posteriorly along the left tentorial insertion. Mild underlying temporal lobe edema. Asymmetric enhancement along the left optic nerve sheath likely reflects same process. Likely reflects metastatic disease. Subcentimeter enhancement along the left anterior frontal convexity may be parenchymal or extra-axial. There is abnormal leptomeningeal enhancement  elsewhere also suspicious for metastatic disease. 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 Abnormal marrow signal suspicious for osseous metastatic disease. There is suspected extraosseous extension about the right mandibular condyle. Electronically Signed   By: Macy Mis M.D.   On: 08/13/2020 16:59   DG Hip Unilat W or W/O Pelvis 1 View Right  Result Date: 08/06/2020 CLINICAL DATA:  Metastatic breast cancer.  Right hip pain EXAM: DG HIP (WITH OR WITHOUT PELVIS) 1V RIGHT COMPARISON:  None. FINDINGS: Diffuse sclerotic metastatic disease throughout the pelvis and proximal femur bilaterally. Negative for fracture.  Right hip joint space normal. IMPRESSION: Widespread bony metastatic disease.  Negative for fracture. Electronically Signed   By: Franchot Gallo M.D.   On: 08/06/2020 14:11   DG FEMUR, MIN 2 VIEWS RIGHT  Result Date: 08/06/2020 CLINICAL DATA:   Metastatic breast cancer.  Right hip pain 3 weeks EXAM: RIGHT FEMUR 2 VIEWS COMPARISON:  None. FINDINGS: Patchy sclerotic metastatic disease is present in the right femur and throughout the pelvis. Normal right hip and right knee. Negative for fracture. IMPRESSION: Bony metastatic disease in the right femur.  Negative for fracture. Electronically Signed   By: Franchot Gallo M.D.   On: 08/06/2020 14:12   MR ORBITS W WO CONTRAST  Result Date: 08/13/2020 CLINICAL DATA:  Blurry vision left eye, reported abnormal finding eye doctor; history of breast cancer EXAM: MRI HEAD AND ORBITS WITHOUT AND WITH CONTRAST TECHNIQUE: Multiplanar, multiecho pulse sequences of the brain and surrounding structures were obtained without and with intravenous contrast. Multiplanar, multiecho pulse sequences of the orbits and surrounding structures were obtained including fat saturation techniques, before and after intravenous contrast administration. CONTRAST:  60m GADAVIST GADOBUTROL 1 MMOL/ML IV SOLN COMPARISON:  None. FINDINGS: MRI HEAD FINDINGS Brain: See series 16 for saved images. There is slightly nodular dural-based enhancement along the left sphenoid bone and middle cranial fossa. Mild edema is present in the underlying left temporal lobe. Suspected adjacent sulcal leptomeningeal enhancement. Dural enhancement extends posteriorly along the left tentorial insertion. Much less prominent dural-based enhancement is also likely present along the medial margin of the right middle cranial fossa. There is abnormal enhancement within the internal auditory canals. There is also abnormal enhancement along the trigeminal nerves. Suspected abnormal leptomeningeal enhancement along the brainstem. Enhancement measuring 9 x 7 mm along the left anterior frontal convexity (series 16, image 19). There is no acute infarction or intracranial hemorrhage. Prominence of the ventricles and sulci reflects minor generalized parenchymal volume loss.  Suspected mild disproportionate ventricular prominence. Patchy foci of T2 hyperintensity in the supratentorial white matter are nonspecific but may reflect minor chronic microvascular ischemic changes. Vascular: Major vessel flow voids at the skull base are preserved. Skull and upper cervical spine: Decreased and mildly heterogeneous T1 marrow signal, which may reflect metastatic disease. Suggestion of an expansile lesion of the right mandibular condyle. Other: Minimal patchy mastoid fluid opacification. Sella is unremarkable. MRI ORBITS FINDINGS Orbits: Asymmetric enhancement of the left optic nerve sheath. Globes, extraocular muscles, and lacrimal glands are symmetric and unremarkable. Visualized sinuses: Minor mucosal thickening Soft tissues: Unremarkable. IMPRESSION: Abnormal dural-based enhancement in the left middle cranial fossa extending posteriorly along the left tentorial insertion. Mild underlying temporal lobe edema. Asymmetric enhancement along the left optic nerve sheath likely reflects same process. Likely reflects metastatic disease. Subcentimeter enhancement along the left anterior frontal convexity may be parenchymal or extra-axial. There is abnormal leptomeningeal enhancement elsewhere also suspicious for metastatic disease. 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 Abnormal marrow signal suspicious  for osseous metastatic disease. There is suspected extraosseous extension about the right mandibular condyle. Electronically Signed   By: Macy Mis M.D.   On: 08/13/2020 16:59       IMPRESSION/PLAN: 1. 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  Dr. Lisbeth Renshaw discusses the findings and the patient's diagnosis and recent course.  He recommends stat MRI of the totalspine.  We will coordinate this and try to see if it can be  done prior to discussion in brain oncology conference coming up this Monday.  He reviews the rationale for whole brain radiation treatment which she feels should urgently begin in the next day.  We are trying to coordinate for the patient to come in this afternoon for simulation and for treatment to begin today.  We reviewed the risks, benefits, short-term and long-term effects of radiotherapy as well as the delivery and logistics of treatment and the patient is interested in proceeding.  We then discussed the off label use of Namenda to try and preserve cognitive function as it was used and continues to be recommended in NCCN guidelines for small cell lung cancer patients receiving whole brain radiation.  We reviewed the side effect profile and the patient is interested in using this medication.  A prescription was sent into her pharmacy.  She is also encouraged to continue taking dexamethasone which has been prescribed by the emergency room department.  We will refer her to see Dr. Mickeal Skinner for more formal neurologic evaluation and guidance given her symptoms.  He will see her tomorrow following her treatment.  We appreciate his recommendations for management of her steroids as well. 2. Claustrophobia.  The patient is claustrophobic especially when having MRI scans.  A new prescription was sent in for Ativan 0.5 mg strength to be taken 1 tablet p.o. 30 minutes prior to MRIs or radiotherapy.  A prescription was sent into her pharmacy after we reviewed the side effect profile.  She will also have access to this in the department today prior to her simulation.  Given current concerns for patient exposure during the COVID-19 pandemic, this encounter was conducted via telephone.  The patient has provided two factor identification and has given verbal consent for this type of encounter and has been advised to only accept a meeting of this type in a secure network environment. The time spent during this encounter was  60 minutes including preparation, discussion, and coordination of the patient's care. The attendants for this meeting include Blenda Nicely, RN, Dr. Lisbeth Renshaw, Hayden Pedro  and Darlina Guys.  During the encounter,  Blenda Nicely, RN, Dr. Lisbeth Renshaw, and Hayden Pedro were located at [facility] Radiation Oncology Department.  Abigail [name] was located at home.   The above documentation reflects my direct findings during this shared patient visit. Please see the separate note by Dr. Lisbeth Renshaw on this date for the remainder of the patient's plan of care.    Carola Rhine, PAC

## 2020-08-14 NOTE — Telephone Encounter (Signed)
Called patient's niece Ms. Laurann Montana to inform of MRI for 08-16-20 - arrival time - 11:30 am @ [facility], no restrictions to test, spoke with patient's niece Ms. Laurann Montana and she is aware of this test

## 2020-08-15 ENCOUNTER — Ambulatory Visit
Admission: RE | Admit: 2020-08-15 | Discharge: 2020-08-15 | Disposition: A | Discharge status: Home / Self Care | Payer: 59 | Source: Ambulatory Visit | Attending: Radiation Oncology | Admitting: Radiation Oncology

## 2020-08-15 ENCOUNTER — Inpatient Hospital Stay (HOSPITAL_COMMUNITY)
Admission: EM | Admit: 2020-08-15 | Discharge: 2020-08-19 | DRG: 055 | Disposition: A | Discharge status: Hospice | Payer: 59 | Attending: Internal Medicine | Admitting: Internal Medicine

## 2020-08-15 ENCOUNTER — Encounter: Payer: Self-pay | Admitting: Radiation Oncology

## 2020-08-15 ENCOUNTER — Other Ambulatory Visit: Payer: Self-pay

## 2020-08-15 ENCOUNTER — Inpatient Hospital Stay: Payer: 59 | Attending: Oncology | Admitting: Internal Medicine

## 2020-08-15 ENCOUNTER — Emergency Department (HOSPITAL_COMMUNITY): Payer: 59

## 2020-08-15 ENCOUNTER — Encounter (HOSPITAL_COMMUNITY): Payer: Self-pay | Admitting: Emergency Medicine

## 2020-08-15 ENCOUNTER — Other Ambulatory Visit: Payer: Self-pay | Admitting: Radiation Oncology

## 2020-08-15 ENCOUNTER — Ambulatory Visit: Payer: 59 | Admitting: Radiation Oncology

## 2020-08-15 VITALS — BP 124/81 | HR 56 | Temp 98.0°F | Resp 18 | Ht 61.0 in | Wt 135.0 lb

## 2020-08-15 DIAGNOSIS — C7951 Secondary malignant neoplasm of bone: Secondary | ICD-10-CM | POA: Diagnosis present

## 2020-08-15 DIAGNOSIS — G9340 Encephalopathy, unspecified: Secondary | ICD-10-CM | POA: Diagnosis not present

## 2020-08-15 DIAGNOSIS — E876 Hypokalemia: Secondary | ICD-10-CM | POA: Diagnosis present

## 2020-08-15 DIAGNOSIS — Z9221 Personal history of antineoplastic chemotherapy: Secondary | ICD-10-CM

## 2020-08-15 DIAGNOSIS — Z79899 Other long term (current) drug therapy: Secondary | ICD-10-CM

## 2020-08-15 DIAGNOSIS — C50919 Malignant neoplasm of unspecified site of unspecified female breast: Secondary | ICD-10-CM | POA: Diagnosis present

## 2020-08-15 DIAGNOSIS — Z66 Do not resuscitate: Secondary | ICD-10-CM | POA: Diagnosis present

## 2020-08-15 DIAGNOSIS — Z20822 Contact with and (suspected) exposure to covid-19: Secondary | ICD-10-CM | POA: Diagnosis present

## 2020-08-15 DIAGNOSIS — Z86718 Personal history of other venous thrombosis and embolism: Secondary | ICD-10-CM

## 2020-08-15 DIAGNOSIS — C7931 Secondary malignant neoplasm of brain: Principal | ICD-10-CM | POA: Diagnosis present

## 2020-08-15 DIAGNOSIS — Z515 Encounter for palliative care: Secondary | ICD-10-CM

## 2020-08-15 DIAGNOSIS — C7949 Secondary malignant neoplasm of other parts of nervous system: Secondary | ICD-10-CM | POA: Diagnosis present

## 2020-08-15 DIAGNOSIS — R569 Unspecified convulsions: Secondary | ICD-10-CM | POA: Diagnosis present

## 2020-08-15 DIAGNOSIS — H538 Other visual disturbances: Secondary | ICD-10-CM | POA: Diagnosis present

## 2020-08-15 DIAGNOSIS — E785 Hyperlipidemia, unspecified: Secondary | ICD-10-CM | POA: Diagnosis present

## 2020-08-15 DIAGNOSIS — J309 Allergic rhinitis, unspecified: Secondary | ICD-10-CM | POA: Diagnosis present

## 2020-08-15 DIAGNOSIS — Z882 Allergy status to sulfonamides status: Secondary | ICD-10-CM

## 2020-08-15 DIAGNOSIS — R400 Somnolence: Secondary | ICD-10-CM

## 2020-08-15 DIAGNOSIS — Z7901 Long term (current) use of anticoagulants: Secondary | ICD-10-CM

## 2020-08-15 DIAGNOSIS — R0989 Other specified symptoms and signs involving the circulatory and respiratory systems: Secondary | ICD-10-CM

## 2020-08-15 DIAGNOSIS — C50912 Malignant neoplasm of unspecified site of left female breast: Secondary | ICD-10-CM | POA: Diagnosis present

## 2020-08-15 DIAGNOSIS — Z91013 Allergy to seafood: Secondary | ICD-10-CM

## 2020-08-15 LAB — TSH: TSH: 0.307 u[IU]/mL — ABNORMAL LOW (ref 0.350–4.500)

## 2020-08-15 LAB — COMPREHENSIVE METABOLIC PANEL
ALT: 35 U/L (ref 0–44)
AST: 34 U/L (ref 15–41)
Albumin: 3.3 g/dL — ABNORMAL LOW (ref 3.5–5.0)
Alkaline Phosphatase: 94 U/L (ref 38–126)
Anion gap: 14 (ref 5–15)
BUN: 13 mg/dL (ref 6–20)
CO2: 29 mmol/L (ref 22–32)
Calcium: 10 mg/dL (ref 8.9–10.3)
Chloride: 90 mmol/L — ABNORMAL LOW (ref 98–111)
Creatinine, Ser: 0.59 mg/dL (ref 0.44–1.00)
GFR, Estimated: >60 mL/min (ref >60)
Glucose, Bld: 101 mg/dL — ABNORMAL HIGH (ref 70–99)
Potassium: 3.2 mmol/L — ABNORMAL LOW (ref 3.5–5.1)
Sodium: 133 mmol/L — ABNORMAL LOW (ref 135–145)
Total Bilirubin: 0.9 mg/dL (ref 0.3–1.2)
Total Protein: 6.7 g/dL (ref 6.5–8.1)

## 2020-08-15 LAB — CBC WITH DIFFERENTIAL/PLATELET
Abs Immature Granulocytes: 0.05 10*3/uL (ref 0.00–0.07)
Basophils Absolute: 0 10*3/uL (ref 0.0–0.1)
Basophils Relative: 0 %
Eosinophils Absolute: 0 10*3/uL (ref 0.0–0.5)
Eosinophils Relative: 0 %
HCT: 30.1 % — ABNORMAL LOW (ref 36.0–46.0)
Hemoglobin: 9.6 g/dL — ABNORMAL LOW (ref 12.0–15.0)
Immature Granulocytes: 1 %
Lymphocytes Relative: 8 %
Lymphs Abs: 0.6 10*3/uL — ABNORMAL LOW (ref 0.7–4.0)
MCH: 30.1 pg (ref 26.0–34.0)
MCHC: 31.9 g/dL (ref 30.0–36.0)
MCV: 94.4 fL (ref 80.0–100.0)
Monocytes Absolute: 0.9 10*3/uL (ref 0.1–1.0)
Monocytes Relative: 12 %
Neutro Abs: 5.8 10*3/uL (ref 1.7–7.7)
Neutrophils Relative %: 79 %
Platelets: 204 10*3/uL (ref 150–400)
RBC: 3.19 MIL/uL — ABNORMAL LOW (ref 3.87–5.11)
RDW: 18.6 % — ABNORMAL HIGH (ref 11.5–15.5)
WBC: 7.3 10*3/uL (ref 4.0–10.5)
nRBC: 0 % (ref 0.0–0.2)

## 2020-08-15 LAB — AMMONIA: Ammonia: 28 umol/L (ref 9–35)

## 2020-08-15 IMAGING — DX DG CHEST 1V PORT
1 series · 1 of 1 positions shown · non-contrast
Comparison: Chest radiograph [DATE].  Chest CT [DATE]

CLINICAL DATA: Somnolence. Rule out infection leading to
encephalopathy.

EXAM:
PORTABLE CHEST 1 VIEW

[chest ap]
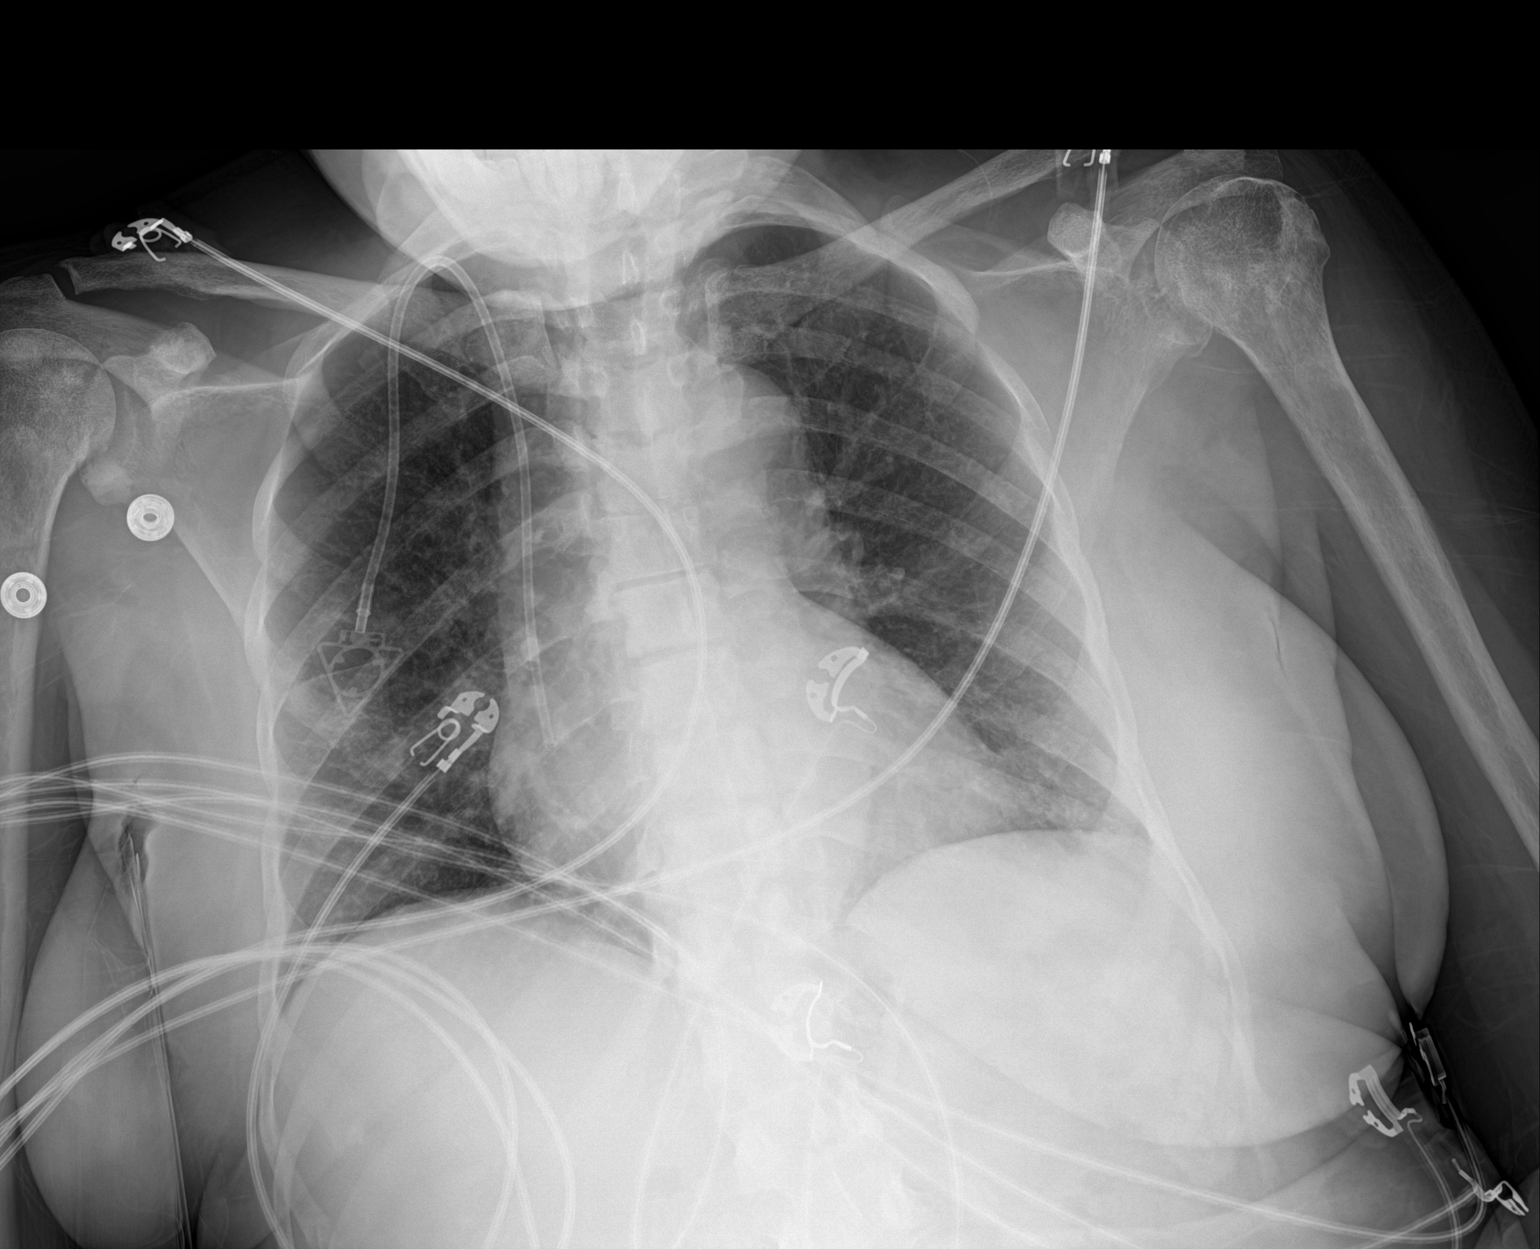

[1 of 1 positions shown; findings below may reference images not displayed]

FINDINGS: Right chest port remains in place. PleurX catheter at the left lung
base on prior chest CT is not definitively seen. There is no
evidence of recurrent pleural effusion. No focal airspace disease.
Heart is normal in size. No pneumothorax. Known osseous metastatic
disease with sclerotic densities involving the shoulders. Many of
the thoracic cage metastasis or radiographically occult.
IMPRESSION: 1. No evidence of intrathoracic infection.
2. Left PleurX catheter on prior chest CT is not definitively seen.
No evidence of recurrent pleural effusion.
3. Known osseous metastatic disease.

## 2020-08-15 IMAGING — MR MR HEAD WO/W CM
15 of 17 series · 42 of 48 positions shown · IV contrast (Gadavist)
Comparison: MRI head with contrast [DATE]

CLINICAL DATA: Brain tumor follow-up.  History of breast cancer.

EXAM:
MRI HEAD WITHOUT AND WITH CONTRAST
TECHNIQUE: Multiplanar, multiecho pulse sequences of the brain and surrounding
structures were obtained without and with intravenous contrast.
CONTRAST:  6mL GADAVIST GADOBUTROL 1 MMOL/ML IV SOLN

[Series 5: DWI · axial · 3.0mm · 0.88mm/px · z∈[-122,+24]mm · 7 of 100 slices shown (1 of 4)]
[im 1/100]
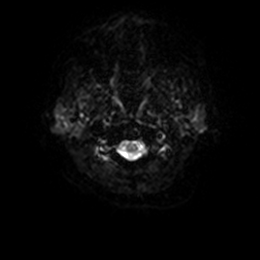
[im 17/100]
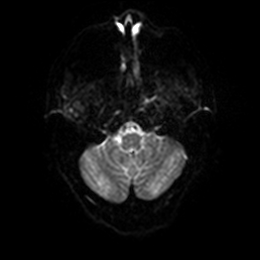
[im 34/100]
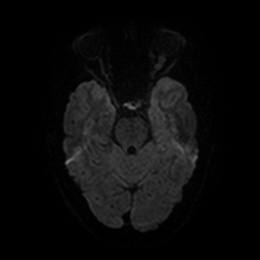
[im 50/100]
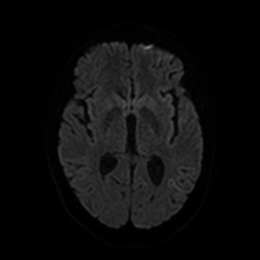
[im 67/100]
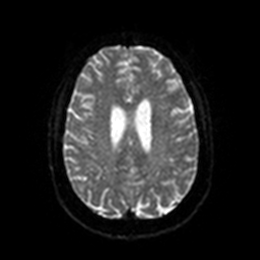
[im 83/100]
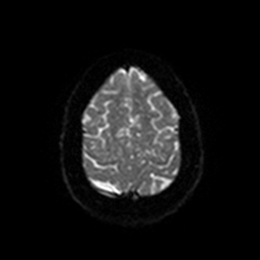
[im 100/100]
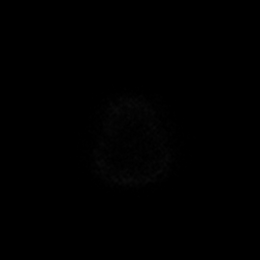

[Series 6: DWI · axial · 3.0mm · 0.88mm/px · z∈[-122,+24]mm · 4 of 50 slices shown (2 of 4)]
[im 1/50]
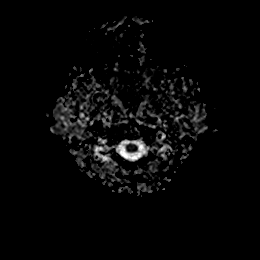
[im 17/50]
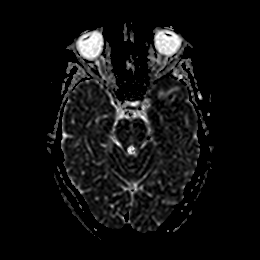
[im 33/50]
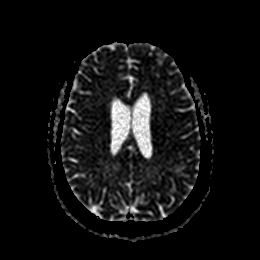
[im 50/50]
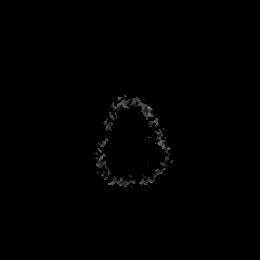

[Series 7: DWI · coronal · 4.0mm · 0.88mm/px · 5 of 76 slices shown (3 of 4)]
[im 1/76]
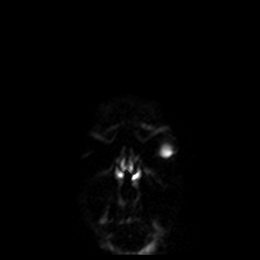
[im 19/76]
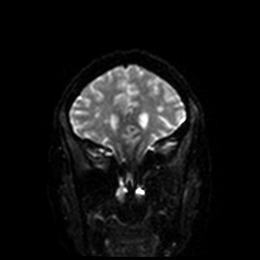
[im 38/76]
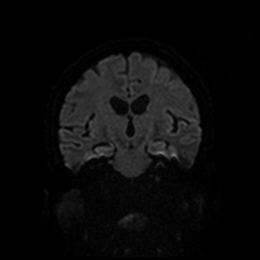
[im 57/76]
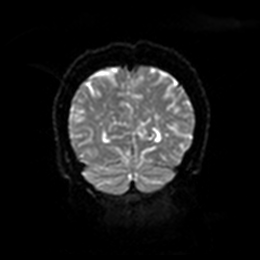
[im 76/76]
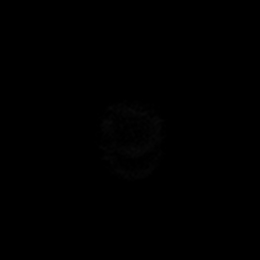

[Series 8: DWI · coronal · 4.0mm · 0.88mm/px · 2 of 38 slices shown (4 of 4)]
[im 1/38]
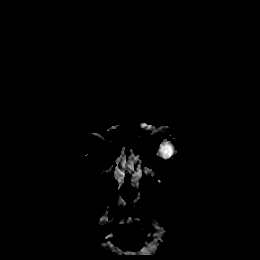
[im 38/38]
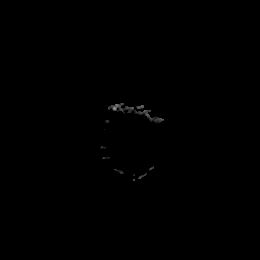

[Series 9: T1 · sagittal · 5.0mm · 0.75mm/px · 1 of 23 slices shown]
[im 1/23]
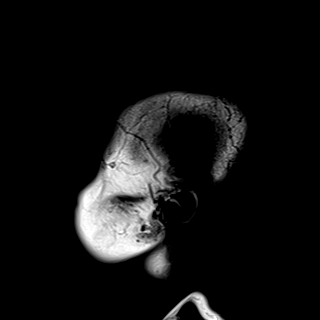

[Series 10: T2 · axial · 5.0mm · 0.72mm/px · z∈[-116,+28]mm · 2 of 25 slices shown]
[im 1/25]
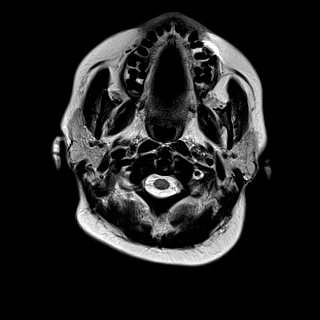
[im 25/25]
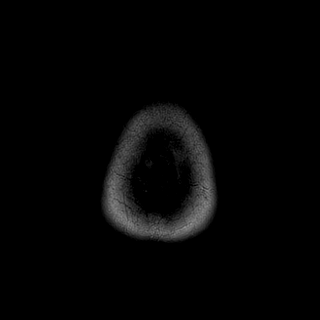

[Series 11: FLAIR · axial · 5.0mm · 0.45mm/px · z∈[-117,+26]mm · 2 of 25 slices shown (1 of 2)]
[im 1/25]
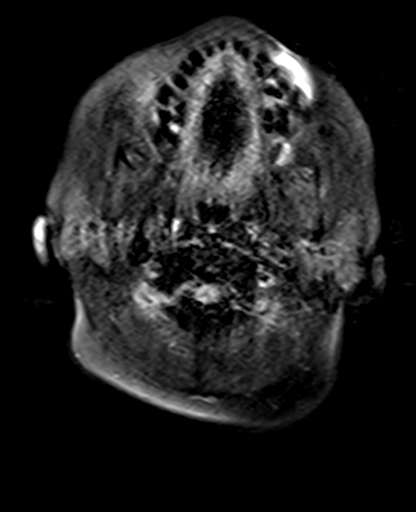
[im 25/25]
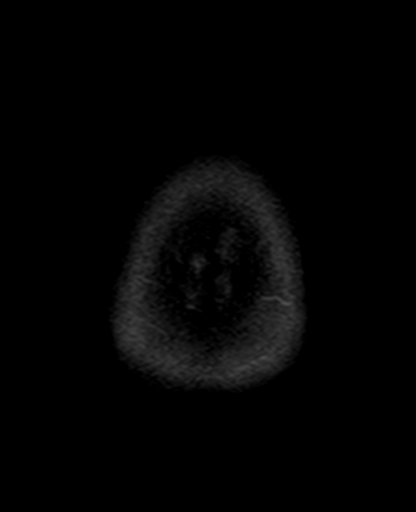

[Series 12: mag_images · axial · 3.0mm · 0.90mm/px · z∈[-130,+35]mm · 3 of 56 slices shown]
[im 1/56]
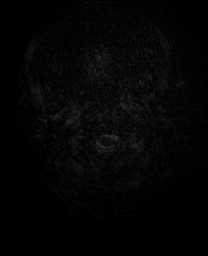
[im 28/56]
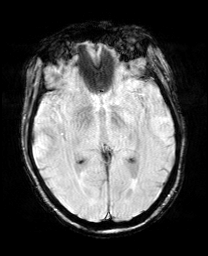
[im 56/56]
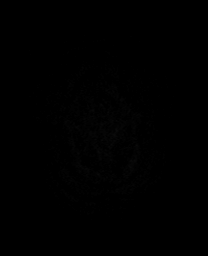

[Series 13: pha_images · axial · 3.0mm · 0.90mm/px · z∈[-130,+32]mm · 3 of 55 slices shown]
[im 1/55]
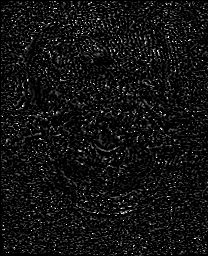
[im 28/55]
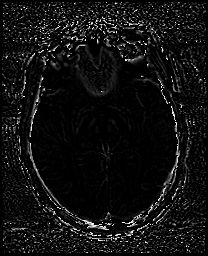
[im 55/55]
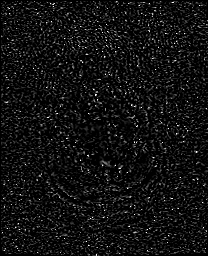

[Series 14: swi_images · axial · 3.0mm · 0.90mm/px · z∈[-130,+35]mm · 3 of 56 slices shown]
[im 1/56]
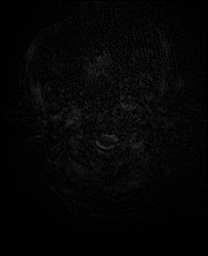
[im 28/56]
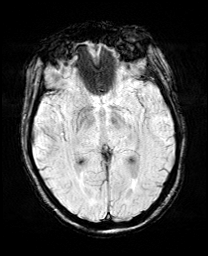
[im 56/56]
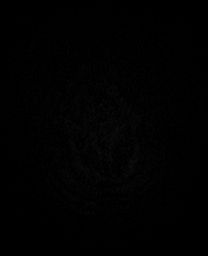

[Series 15: mip_images(sw) · axial · 24.0mm · 0.90mm/px · z∈[-119,+25]mm · 3 of 49 slices shown]
[im 1/49]
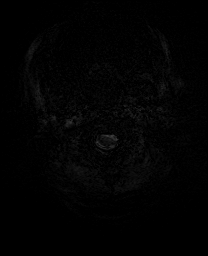
[im 25/49]
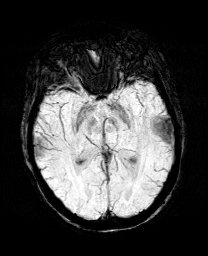
[im 49/49]
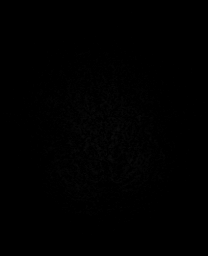

[Series 16: FLAIR · axial · 5.0mm · 0.90mm/px · z∈[-113,+31]mm · 2 of 25 slices shown (2 of 2)]
[im 1/25]
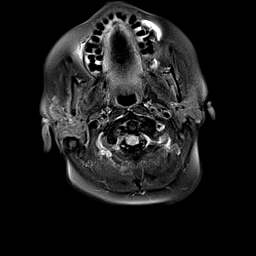
[im 25/25]
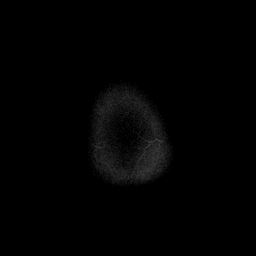

[Series 18: T2 post-contrast · coronal · 5.0mm · 0.72mm/px · 2 of 30 slices shown]
[im 1/30]
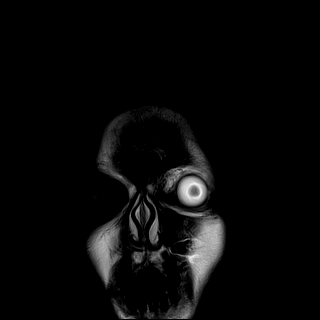
[im 30/30]
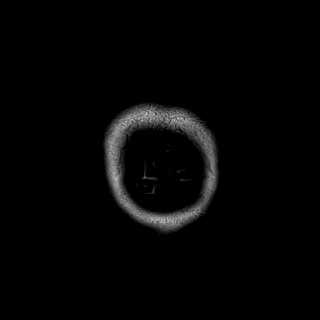

[Series 20: T1 post-contrast · coronal · 5.0mm · 0.34mm/px · 2 of 30 slices shown (1 of 2)]
[im 1/30]
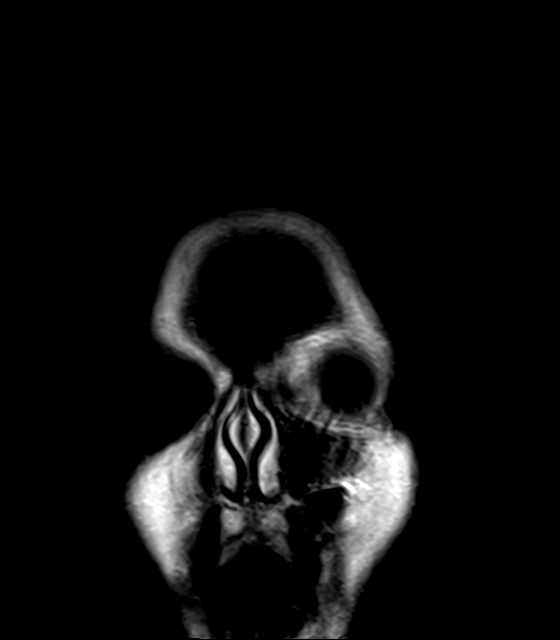
[im 30/30]
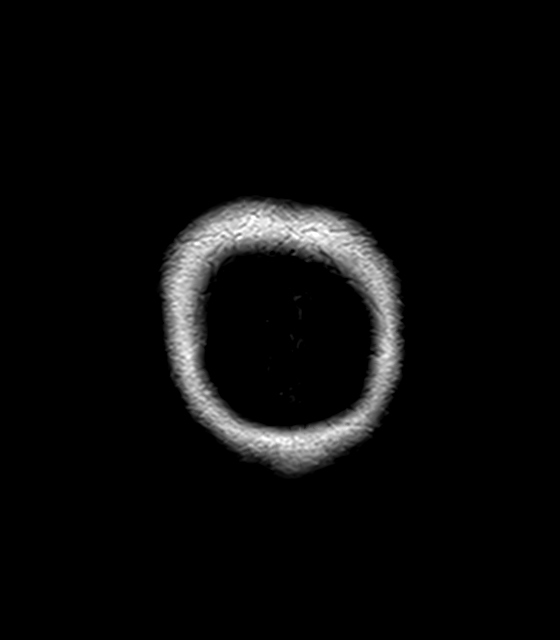

[Series 21: T1 post-contrast · sagittal · 5.0mm · 0.72mm/px · 1 of 23 slices shown (2 of 2)]
[im 1/23]
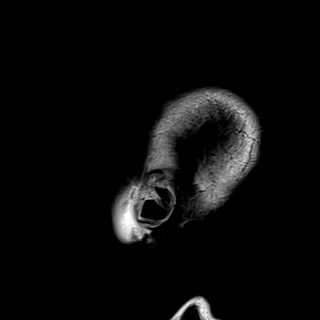

[42 of 48 positions shown; findings below may reference images not displayed]

FINDINGS: Brain: Motion degraded study.

Abnormal dural nodular enhancement in the left middle cranial fossa
similar to the prior study. This extends posteriorly along the
tentorium. There is edema in the left anterior temporal lobe on
FLAIR which is slightly more prominent.

Smaller area of enhancement in the left frontal lobe anteriorly
which appears extra-axial and likely is leptomeningeal and dural
enhancement due to tumor. This also shows associated FLAIR
hyperintensity in the left frontal lobe which is slightly more
prominent.

There is abnormal enhancement in the internal auditory canal
bilaterally most likely due to tumor. There is enhancement in the
left optic nerve a sheath due to leptomeningeal tumor spread.

Motion degraded postcontrast image. No definite parenchymal
metastatic deposit. Negative for acute infarct. Ventricle size
slightly prominent but unchanged.

Vascular: Normal arterial flow voids.

Skull and upper cervical spine: Bone marrow diffusely low signal on
T1 suggesting metastatic disease. Enhancing lesion right mandibular
condyle and ramus consistent with metastatic disease.

Sinuses/Orbits: Patchy enhancement of the left optic nerve sheath
compatible with tumor spread. No mass lesion in the orbit.

Other: None
IMPRESSION: Motion degraded study

Again noted is metastatic disease to the dura in the left middle
cranial fossa. Mild increase in edema in the left anterior temporal
lobe. Similar leptomeningeal enhancement in the left frontal lobe
with slight increase in edema in the adjacent left frontal lobe.

Leptomeningeal tumor spread also in the internal auditory canal
bilaterally and in the left optic nerve sheath.

Bony metastatic disease to the calvarium and right mandibular
condyle and ramus.

## 2020-08-15 MED ORDER — GADOBUTROL 1 MMOL/ML IV SOLN
6.0000 mL | Freq: Once | INTRAVENOUS | Status: AC | PRN
  Administered 2020-08-15: 6 mL via INTRAVENOUS

## 2020-08-15 MED ORDER — CHLORHEXIDINE GLUCONATE CLOTH 2 % EX PADS
6.0000 | MEDICATED_PAD | Freq: Every day | CUTANEOUS | Status: DC
  Administered 2020-08-16 – 2020-08-19 (×3): 6 via TOPICAL

## 2020-08-15 MED ORDER — SODIUM CHLORIDE 0.9% FLUSH
10.0000 mL | Freq: Two times a day (BID) | INTRAVENOUS | Status: DC
  Administered 2020-08-16 – 2020-08-19 (×7): 10 mL

## 2020-08-15 MED ORDER — DEXAMETHASONE SODIUM PHOSPHATE 10 MG/ML IJ SOLN
10.0000 mg | Freq: Once | INTRAMUSCULAR | Status: AC
  Administered 2020-08-16: 10 mg via INTRAVENOUS
  Filled 2020-08-15: qty 1

## 2020-08-15 MED ORDER — SODIUM CHLORIDE 0.9% FLUSH: 10.0000 mL | INTRAVENOUS | Status: DC | PRN

## 2020-08-15 MED ORDER — ONDANSETRON HCL 8 MG PO TABS: 8.0000 mg | ORAL_TABLET | Freq: Three times a day (TID) | ORAL | 1 refills | Status: AC | PRN

## 2020-08-15 NOTE — ED Provider Notes (Signed)
[facility] Provider Note   CSN: [id number] Arrival date & time: 08/15/20  1757     History Chief Complaint  Patient presents with  . Fatigue    Abigail [name] is a 54 y.o. [name].  The history is provided by the patient and medical records. No language interpreter was used.  Altered Mental Status Presenting symptoms: partial responsiveness   Presenting symptoms comment:  Somnolence and fatigue  Severity:  Severe Most recent episode:  Today Episode history:  Continuous Timing:  Constant Progression:  Worsening Chronicity:  New Context comment:  Brain tumor mets Associated symptoms: weakness   Associated symptoms: no abdominal pain, no agitation, no fever, no headaches, no light-headedness, no nausea, no seizures, no slurred speech and no vomiting        Past Medical History:  Diagnosis Date  . Allergic rhinitis   . Hyperlipidemia    LDL 150's '11    Patient Active Problem List   Diagnosis Date Noted  . 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 Brain metastases ([place]) 08/14/2020  . Genetic testing 05/01/2020  . Acute respiratory failure with hypoxia ([facility]) 12/19/2019  . Port-A-Cath in place 11/14/2019  . Goals of care, counseling/discussion 11/02/2019  . Breast cancer ([place]) 11/02/2019  . HCAP (healthcare-associated pneumonia) 10/25/2019  . 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 Breast mass, left 10/25/2019  . DVT (deep vein thrombosis) in pregnancy 10/25/2019  . Hypokalemia 10/25/2019  . Acute deep vein thrombosis (DVT) of axillary vein of left upper extremity ([place]) 10/19/2019  . Left arm pain 09/10/2019  . Routine health maintenance 08/04/2011  . Borderline hyperlipidemia 06/05/2010    Past Surgical History:  Procedure Laterality Date  . CESAREAN SECTION    . IR IMAGING GUIDED PORT INSERTION  11/07/2019  . IR PERC PLEURAL DRAIN W/INDWELL CATH W/IMG GUIDE  12/20/2019  . IR REMOVAL OF PLURAL CATH W/CUFF  04/09/2020  . IR  THORACENTESIS ASP PLEURAL SPACE W/IMG GUIDE  11/09/2019  . IR US GUIDE BX ASP/DRAIN  10/29/2019     OB History   No obstetric history on file.     Family History  Problem Relation Age of Onset  . Diabetes Mother   . Hypertension Mother   . Hypertension Father   . Hypertension Sister   . Heart disease Sister        MI - no serious damage to heart  . Cancer Neg Hx     Social History   Tobacco Use  . Smoking status: Never Smoker  . Smokeless tobacco: Never Used  Substance Use Topics  . Alcohol use: Yes    Comment: rare use - twice rare  . Drug use: No    Home Medications Prior to Admission medications   Medication Sig Start Date End Date Taking? Authorizing Provider  capecitabine (XELODA) 500 MG tablet Take 1500 mg AM, Take 1500 mg PM for total dose of 3000 mg daily, Take for 14 days on and 7 days off. Repeat every 21 days 08/06/20   Ladell Pier, MD  dexamethasone (DECADRON) 4 MG tablet Take 1 tablet (4 mg total) by mouth 2 (two) times daily with a meal for 10 days. 08/13/20 08/23/20  Marcello Fennel, PA-C  HYDROcodone-acetaminophen (NORCO/VICODIN) 5-325 MG tablet Take 1 tablet by mouth every 8 (eight) hours as needed for moderate pain or severe pain. 08/06/20   Owens Shark, NP  lidocaine-prilocaine (EMLA) cream Apply 1 application topically as directed. Apply to port 1 hour prior  to stick and cover with plastic wrap 03/26/20   Owens Shark, NP  LORazepam (ATIVAN) 0.5 MG tablet Take one tablet po 30 min prior to MRI or radiation for claustrophobia 08/14/20   Hayden Pedro, PA-C  memantine (NAMENDA) 10 MG tablet Take 1 tablet (10 mg total) by mouth 2 (two) times daily. Start after 4 weeks of titration 08/14/20   Hay[place] Pedro, PA-C  memantine [facility]) 5 MG tablet Start on 1st day of radiation. Week 1: take one po daily. Week 2: take one po twice daily. Week 3: take two po qam, and one po q pm. Week 4: take 2 po twice daily, and fill second prescription  08/14/20   Hayden Pedro, PA-C  ondansetron (ZOFRAN) 8 MG tablet Take 1 tablet (8 mg total) by mouth every 8 (eight) hours as needed for nausea or vomiting. 08/15/20   Kyung Rudd, MD  potassium chloride SA (KLOR-CON) 20 MEQ tablet Take 1 tablet (20 mEq total) by mouth daily. Patient not taking: Reported on 02/13/2020 12/12/19   Ladell Pier, MD  rivaroxaban (XARELTO) 20 MG TABS tablet Take 1 tablet (20 mg total) by mouth daily with supper. 02/13/20   Ladell Pier, MD    Allergies    Shellfish allergy and Sulfamethoxazole  Review of Systems   Review of Systems  Constitutional: Positive for fatigue. Negative for chills, diaphoresis and fever.  HENT: Negative for congestion.   Respiratory: Negative for cough, chest tightness and shortness of breath.   Cardiovascular: Negative for chest pain.  Gastrointestinal: Negative for abdominal pain, constipation, diarrhea, nausea and vomiting.  Genitourinary: Negative for dysuria and flank pain.  Musculoskeletal: Negative for back pain, neck pain and neck stiffness.  Neurological: Positive for weakness. Negative for seizures, facial asymmetry, speech difficulty, light-headedness, numbness and headaches.  Psychiatric/Behavioral: Negative for agitation.  All other systems reviewed and are negative.   Physical Exam Updated Vital Signs There were no vitals taken for this visit.  Physical Exam Vitals and nursing note reviewed. Exam conducted with a chaperone present.  Constitutional:      General: She is not in acute distress.    Appearance: She is well-developed. She is ill-appearing. She is not toxic-appearing or diaphoretic.  HENT:     Head: Normocephalic and atraumatic.     Nose: No congestion or rhinorrhea.     Mouth/Throat:     Mouth: Mucous membranes are moist.     Pharynx: No oropharyngeal exudate or posterior oropharyngeal erythema.  Eyes:     Conjunctiva/sclera: Conjunctivae normal.     Comments: Left pupil is less reactive  than right.  Difficult to assess extraocular movements as patient is not following all directions all exam maneuvers.  Cardiovascular:     Rate and Rhythm: Normal rate and regular rhythm.     Heart sounds: No murmur heard.   Pulmonary:     Effort: Pulmonary effort is normal. No respiratory distress.     Breath sounds: Normal breath sounds. No wheezing, rhonchi or rales.  Chest:     Chest wall: No tenderness.  Abdominal:     General: Abdomen is flat.     Palpations: Abdomen is soft.     Tenderness: There is no abdominal tenderness. There is no right CVA tenderness, left CVA tenderness, guarding or rebound.  Musculoskeletal:        General: No signs of injury.     Cervical back: Neck supple. No tenderness.     Comments: Firmness in  the left breast tissue with a chaperone.  No fluctuance.  Patient denies tenderness.  Skin:    General: Skin is warm and dry.     Findings: No rash.  Neurological:     Mental Status: She is alert.     Sensory: No sensory deficit.     Motor: Weakness present.  Psychiatric:        Mood and Affect: Mood normal.     ED Results / Procedures / Treatments   Labs (all labs ordered are listed, but only abnormal results are displayed) Labs Reviewed  CBC WITH DIFFERENTIAL/PLATELET - Abnormal; Notable for the following components:      Result Value   RBC 3.19 (*)    Hemoglobin 9.6 (*)    HCT 30.1 (*)    RDW 18.6 (*)    Lymphs Abs 0.6 (*)    All other components within normal limits  COMPREHENSIVE METABOLIC PANEL - Abnormal; Notable for the following components:   Sodium 133 (*)    Potassium 3.2 (*)    Chloride 90 (*)    Glucose, Bld 101 (*)    Albumin 3.3 (*)    All other components within normal limits  TSH - Abnormal; Notable for the following components:   TSH 0.307 (*)    All other components within normal limits  URINE CULTURE  RESPIRATORY PANEL BY RT PCR (FLU A&B, COVID)  AMMONIA  URINALYSIS, ROUTINE W REFLEX MICROSCOPIC     EKG None  Radiology MR Brain W and Wo Contrast  Result Date: 08/15/2020 CLINICAL DATA:  Brain tumor follow-up.  History of breast cancer. EXAM: MRI HEAD WITHOUT AND WITH CONTRAST TECHNIQUE: Multiplanar, multiecho pulse sequences of the brain and surrounding structures were obtained without and with intravenous contrast. CONTRAST:  15mL GADAVIST GADOBUTROL 1 MMOL/ML IV SOLN COMPARISON:  MRI head with contrast 08/13/2020 FINDINGS: Brain: Motion degraded study. Abnormal dural nodular enhancement in the left middle cranial fossa similar to the prior study. This extends posteriorly along the tentorium. There is edema in the left anterior temporal lobe on FLAIR which is slightly more prominent. Smaller area of enhancement in the left frontal lobe anteriorly which appears extra-axial and likely is leptomeningeal and dural enhancement due to tumor. This also shows associated FLAIR hyperintensity in the left frontal lobe which is slightly more prominent. There is abnormal enhancement in the internal auditory canal bilaterally most likely due to tumor. There is enhancement in the left optic nerve a sheath due to leptomeningeal tumor spread. Motion degraded postcontrast image. No definite parenchymal metastatic deposit. Negative for acute infarct. Ventricle size slightly prominent but unchanged. Vascular: Normal arterial flow voids. Skull and upper cervical spine: Bone marrow diffusely low signal on T1 suggesting metastatic disease. Enhancing lesion right mandibular condyle and ramus consistent with metastatic disease. Sinuses/Orbits: Patchy enhancement of the left optic nerve sheath compatible with tumor spread. No mass lesion in the orbit. Other: None IMPRESSION: Motion degraded study Again noted is metastatic disease to the dura in the left middle cranial fossa. Mild increase in edema in the left anterior temporal lobe. Similar leptomeningeal enhancement in the left frontal lobe with slight increase in edema in  the adjacent left frontal lobe. 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 Bony metastatic disease to the calvarium and right mandibular condyle and ramus. Electronically Signed   By: Franchot Gallo M.D.   On: 08/15/2020 22:31   DG Chest Portable 1 View  Result  Date: 08/15/2020 CLINICAL DATA:  Somnolence. Rule out infection leading to encephalopathy. EXAM: PORTABLE CHEST 1 VIEW COMPARISON:  Chest radiograph 12/19/2019.  Chest CT 03/24/2020 FINDINGS: Right chest port remains in place. PleurX catheter at the left lung base on prior chest CT is not definitively seen. There is no evidence of recurrent pleural effusion. No focal airspace disease. Heart is normal in size. No pneumothorax. Known osseous metastatic disease with sclerotic densities involving the shoulders. Many of the thoracic cage metastasis or radiographically occult. IMPRESSION: 1. No evidence of intrathoracic infection. 2. Left PleurX catheter on prior chest CT is not definitively seen. No evidence of recurrent pleural effusion. 3. Known osseous metastatic disease. Electronically Signed   By: Keith Rake M.D.   On: 08/15/2020 19:18    Procedures Procedures (including critical care time)  Medications Ordered in ED Medications  sodium chloride flush (NS) 0.9 % injection 10-40 mL (has no administration in time range)  sodium chloride flush (NS) 0.9 % injection 10-40 mL (has no administration in time range)  Chlorhexidine Gluconate Cloth 2 % PADS 6 each (has no administration in time range)  dexamethasone (DECADRON) injection 10 mg (has no administration in time range)  gadobutrol (GADAVIST) 1 MMOL/ML injection 6 mL (6 mLs Intravenous Contrast Given 08/15/20 2154)    ED Course  I have reviewed the triage vital signs and the nursing notes.  Pertinent labs & imaging results that were available during my care of the patient were reviewed by me and considered in my  medical decision making (see chart for details).    MDM Rules/Calculators/A&P                          Andrienne Havener is a 54 y.o. 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  According to note from patient's oncologist today, patient was found to have brain metastasis and leptomeningeal spread of her cancer.  On the note from the oncologist this morning at around 11:30 AM, he notes that patient had no asymmetry in extremity strength.  According to nursing report, patient has worsened somnolence throughout the day prompting them to have her come to the emergency department for evaluation.  The note from earlier discussed giving Decadron and see how she does.  If she did not improve or worsen, she need to come to the ED for further work-up, management, and possible admission.  Patient reports no complaints other feeling very tired.  She specifically denies any headache or neck pain.  She denies fevers, chills, chest pain, shortness breath, cough, nausea, vomiting, constipation, diarrhea, or urinary symptoms.  She does report feeling tired.  On my exam, I did find abnormalities including right arm and right leg weakness compared to left.  The note from earlier today did not demonstrate this.  She reports symmetric sensation.  No facial droop on my exam.  Abdomen nontender.  Chest nontender.  Lungs clear.    Spoke briefly with neurology after my exam shows some concern for changes throughout the day.  We will get a MRI with and without to look for changes in the last few days.  Patient is denying any headache, less suspicion for hemorrhage.  Neurology felt that MRI would be more beneficial for the patient and CT scan at this time.  MRI ordered.  She will also get work-up to look for occult infection leading  to somnolence and encephalopathy as her oncologist also  expressed this concern.  This was ordered.  Anticipate reassessment and ultimately admission given the somnolence and worsened mental status compared to earlier today.  MRI returned showing more edema on the left side of the brain which may be contributing to the right-sided symptoms we discovered on exam.  I spoke with Dr. Venetia Constable with neurosurgery who does recommend a dose of Decadron 10 mg once and then 4 every 8.  He agrees with admission to medicine for further management for her worsened somnolence.  Anticipate the admitting team speaking with oncology tomorrow and further discussions about goals of care.  Patient will be admitted for further management.  Final Clinical Impression(s) / ED Diagnoses Final diagnoses:  Somnolence     Clinical Impression: 1. Somnolence     Disposition: Admit  This note was prepared with assistance of Dragon voice recognition software. Occasional wrong-word or sound-a-like substitutions may have occurred due to the inherent limitations of voice recognition software.     Chanele Douglas, Gwenyth Allegra, MD 08/15/20 [phone]

## 2020-08-15 NOTE — ED Triage Notes (Signed)
[facility] appointment this am [facility] and was told the brain cancer was spreading.  [facility] pt has been getting more lethargic through out the day.  Pt denies any complaints.  Pt falls asleep easily but will wake with verbal stimuli

## 2020-08-15 NOTE — H&P (Signed)
History and Physical   [name] LZJ:[id number] DOB: 1966-08-30 DOA: 08/15/2020  PCP: Hoyt Koch, MD   Patient coming from: Home  Chief Complaint: Worsening neurologic symptoms  HPI: Abigail [name] is a 55 y.o. 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  For the past week she is experienced worsening blurry vision of left eye, confusion, and gait changes.  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  She was seen at oncology earlier today and they attempted to keep her out of the hospital by treating outpatient with Decadron.  And recommended if patient remained in a state that she presented to the clinic in that she presented to the ED for admission and rule out of metabolic causes of encephalopathy.  Also recommended at end-of-life care should be initiated if her current neurologic symptoms are due to her neurologic spread of her cancer.  This was expressed to the patient's sister as she was accompanying in caring for the patient. After leaving the oncologist office, patient continued to do poorly and never perked up per her sister's report.  So she was brought to the ED for further evaluation and treatment.     ED Course: Vital signs significant for tachypnea to the 20s. Lab work showed mild hyponatremia to 133, K3.2, albumin 3.3, hemoglobin 9.6 (baseline 10.5).  Ammonia within normal limits.  TSH mildly low at 0.3.  Urinalysis, urine culture and respiratory panel ordered but not resulted.  Chest x-ray was without acute abnormality.  MRI showed known brain mets with leptomeningeal spread and increase in temporal edema on the left. EDP discussed with neurology and neurosurgery.  Review of Systems: As per HPI otherwise all other systems reviewed and are negative.   Past Medical History:  Diagnosis Date  . Allergic rhinitis   . Hyperlipidemia      LDL 150's '11    Past Surgical History:  Procedure Laterality Date  . CESAREAN SECTION    . IR IMAGING GUIDED PORT INSERTION  11/07/2019  . IR PERC PLEURAL DRAIN W/INDWELL CATH W/IMG GUIDE  12/20/2019  . IR REMOVAL OF PLURAL CATH W/CUFF  04/09/2020  . IR THORACENTESIS ASP PLEURAL SPACE W/IMG GUIDE  11/09/2019  . IR US GUIDE BX ASP/DRAIN  10/29/2019    Social History  reports that she has never smoked. She has never used smokeless tobacco. She reports current alcohol use. She reports that she does not use drugs.  Allergies  Allergen Reactions  . Shellfish Allergy Itching and Swelling  . Sulfamethoxazole     REACTION: swelling    Family History  Problem Relation Age of Onset  . Diabetes Mother   . Hypertension Mother   . Hypertension Father   . Hypertension Sister   . Heart disease Sister        MI - no serious damage to heart  . Cancer Neg Hx   Reviewed on admission  Prior to Admission medications   Medication Sig Start Date End Date Taking? Authorizing Provider  capecitabine (XELODA) 500 MG tablet Take 1500 mg AM, Take 1500 mg PM for total dose of 3000 mg daily, Take for 14 days on and 7 days off. Repeat every 21 days 08/06/20  Yes Ladell Pier, MD  dexamethasone (DECADRON) 4 MG tablet Take 1 tablet (4 mg total) by mouth 2 (two) times daily with a meal for 10 days. 08/13/20 08/23/20 Yes Deno Etienne  J, PA-C  HYDROcodone-acetaminophen (NORCO/VICODIN) 5-325 MG tablet Take 1 tablet by mouth every 8 (eight) hours as needed for moderate pain or severe pain. 08/06/20  Yes Owens Shark, NP  lidocaine-prilocaine (EMLA) cream Apply 1 application topically as directed. Apply to port 1 hour prior to stick and cover with plastic wrap 03/26/20  Yes Owens Shark, NP  memantine (NAMENDA) 10 MG tablet Take 1 tablet (10 mg total) by mouth 2 (two) times daily. Start after 4 weeks of titration 08/14/20   Hayden Pedro, PA-C  memantine [facility]) 5 MG tablet Start on 1st day of  radiation. Week 1: take one po daily. Week 2: take one po twice daily. Week 3: take two po qam, and one po q pm. Week 4: take 2 po twice daily, and fill second prescription 08/14/20   Hayden Pedro, PA-C  ondansetron (ZOFRAN) 8 MG tablet Take 1 tablet (8 mg total) by mouth every 8 (eight) hours as needed for nausea or vomiting. 08/15/20   Kyung Rudd, MD  potassium chloride SA (KLOR-CON) 20 MEQ tablet Take 1 tablet (20 mEq total) by mouth daily. Patient not taking: Reported on 02/13/2020 12/12/19   Ladell Pier, MD  rivaroxaban (XARELTO) 20 MG TABS tablet Take 1 tablet (20 mg total) by mouth daily with supper. 02/13/20   Ladell Pier, MD    Physical Exam: Vitals:   08/15/20 2115 08/15/20 2300 08/15/20 2315 08/15/20 2330  BP: 125/74 137/72 129/69 121/74  Pulse: (!) 51 (!) 30    Resp: (!) 30 (!) 29 (!) 27 (!) 27  Temp:      TempSrc:      SpO2: 100%      Physical Exam Constitutional:      General: She is not in acute distress.    Comments: Chronically ill appearing female  HENT:     Head: Normocephalic and atraumatic.     Mouth/Throat:     Mouth: Mucous membranes are moist.     Pharynx: Oropharynx is clear.  Eyes:     Extraocular Movements: Extraocular movements intact.     Pupils: Pupils are equal, round, and reactive to light.  Cardiovascular:     Rate and Rhythm: Normal rate and regular rhythm.     Pulses: Normal pulses.     Heart sounds: Normal heart sounds.  Pulmonary:     Effort: Pulmonary effort is normal. No respiratory distress.     Breath sounds: Normal breath sounds.  Abdominal:     General: Bowel sounds are normal. There is no distension.     Palpations: Abdomen is soft.     Tenderness: There is no abdominal tenderness.  Musculoskeletal:        General: No swelling or deformity.  Skin:    General: Skin is warm and dry.  Neurological:     Comments: Drowsy Intermittently alert Oriented to self only Grossly weak on RUE and RLE Unable participate with  full neurologic exam    Labs on Admission: I have personally reviewed following labs and imaging studies  CBC: Recent Labs  Lab 08/13/20 1220 08/15/20 2044  WBC 7.0 7.3  NEUTROABS 5.4 5.8  HGB 10.6* 9.6*  HCT 35.1* 30.1*  MCV 98.9 94.4  PLT 185 109    Basic Metabolic Panel: Recent Labs  Lab 08/13/20 1220 08/15/20 2044  NA 135 133*  K 3.2* 3.2*  CL 93* 90*  CO2 29 29  GLUCOSE 119* 101*  BUN 12 13  CREATININE 0.52  0.59  CALCIUM 10.2 10.0    GFR: Estimated Creatinine Clearance: 67.5 mL/min (by C-G formula based on SCr of 0.59 mg/dL).  Liver Function Tests: Recent Labs  Lab 08/15/20 2044  AST 34  ALT 35  ALKPHOS 94  BILITOT 0.9  PROT 6.7  ALBUMIN 3.3*    Urine analysis:    Component Value Date/Time   COLORURINE LT. YELLOW 08/04/2011 0805   APPEARANCEUR Cloudy 08/04/2011 0805   LABSPEC 1.020 08/04/2011 0805   PHURINE 6.0 08/04/2011 0805   GLUCOSEU NEGATIVE 08/04/2011 0805   HGBUR LARGE 08/04/2011 0805   BILIRUBINUR NEGATIVE 08/04/2011 0805   KETONESUR NEGATIVE 08/04/2011 0805   UROBILINOGEN 0.2 08/04/2011 0805   NITRITE NEGATIVE 08/04/2011 0805   LEUKOCYTESUR NEGATIVE 08/04/2011 0805    Radiological Exams on Admission: MR Brain W and Wo Contrast  Result Date: 08/15/2020 CLINICAL DATA:  Brain tumor follow-up.  History of breast cancer. EXAM: MRI HEAD WITHOUT AND WITH CONTRAST TECHNIQUE: Multiplanar, multiecho pulse sequences of the brain and surrounding structures were obtained without and with intravenous contrast. CONTRAST:  10mL GADAVIST GADOBUTROL 1 MMOL/ML IV SOLN COMPARISON:  MRI head with contrast 08/13/2020 FINDINGS: Brain: Motion degraded study. Abnormal dural nodular enhancement in the left middle cranial fossa similar to the prior study. This extends posteriorly along the tentorium. There is edema in the left anterior temporal lobe on FLAIR which is slightly more prominent. Smaller area of enhancement in the left frontal lobe anteriorly which  appears extra-axial and likely is leptomeningeal and dural enhancement due to tumor. This also shows associated FLAIR hyperintensity in the left frontal lobe which is slightly more prominent. There is abnormal enhancement in the internal auditory canal bilaterally most likely due to tumor. There is enhancement in the left optic nerve a sheath due to leptomeningeal tumor spread. Motion degraded postcontrast image. No definite parenchymal metastatic deposit. Negative for acute infarct. Ventricle size slightly prominent but unchanged. Vascular: Normal arterial flow voids. Skull and upper cervical spine: Bone marrow diffusely low signal on T1 suggesting metastatic disease. Enhancing lesion right mandibular condyle and ramus consistent with metastatic disease. Sinuses/Orbits: Patchy enhancement of the left optic nerve sheath compatible with tumor spread. No mass lesion in the orbit. Other: None IMPRESSION: Motion degraded study Again noted is metastatic disease to the dura in the left middle cranial fossa. Mild increase in edema in the left anterior temporal lobe. Similar leptomeningeal enhancement in the left frontal lobe with slight increase in edema in the adjacent left frontal lobe. 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 Bony metastatic disease to the calvarium and right mandibular condyle and ramus. Electronically Signed   By: Franchot Gallo M.D.   On: 08/15/2020 22:31   DG Chest Portable 1 View  Result Date: 08/15/2020 CLINICAL DATA:  Somnolence. Rule out infection leading to encephalopathy. EXAM: PORTABLE CHEST 1 VIEW COMPARISON:  Chest radiograph 12/19/2019.  Chest CT 03/24/2020 FINDINGS: Right chest port remains in place. PleurX catheter at the left lung base on prior chest CT is not definitively seen. There is no evidence of recurrent pleural effusion. No focal airspace disease. Heart is normal in size. No pneumothorax. Known osseous  metastatic disease with sclerotic densities involving the shoulders. Many of the thoracic cage metastasis or radiographically occult. IMPRESSION: 1. No evidence of intrathoracic infection. 2. Left PleurX catheter on prior chest CT is not definitively seen. No evidence of recurrent pleural effusion. 3. Known osseous metastatic disease. Electronically Signed  By: Keith Rake M.D.   On: 08/15/2020 19:18   EKG: Independently reviewed.  Sinus rhythm, baseline wander  Assessment/Plan Principal Problem:   Encephalopathy acute Active Problems:   Hypokalemia   Breast cancer (HCC)   Brain metastases (HCC)   Leptomeningeal metastases (HCC)  Acute encephalopathy Breast cancer with brain mets and leptomeningeal spread > Known leptomeningeal spread with progressive neurologic symptoms for the past 5 days including blurry vision, confusion, gait changes.  Seen by oncologist earlier today,  trialing on Decadron at home but did not improve and now undergoing evaluation to rule out metabolic encephalopathy.  > Right extremity weakness noted on exam, and sister states this is consistent with what she had seen. > EDP spoke with neurology who recommended the MRI and neurosurgery who recommended Decadron - Neurology and Neurosurgery consulted in ED, appreciate recommendations - Will need oncology consult in the morning, for guidance on her cancer treatment and possible discussion of end-of-life transition - Decadron 10 mg once followed by 4 mg every 8 hours - Chest x-ray without signs of pneumonia, follow-up urinalysis, and blood cultures - Check T4 in the setting of mildly low TSH  Hypokalemia  > K 3.2 in ED - 4 runs of IV potassium - Add on Mg  History of DVT - Resume home anticoagulation tomorrow  DVT prophylaxis: Resume Xarelto tomorrow  Code Status:   Full, family discussing changing patient to DNR status not ready to at this time, sister states that they should have a decision when they come  tomorrow.  They know that they may be facing end-of-life decisions based on their previous conversation with the oncologist.  They are having a difficult time with this as her mother just passed away 1 week ago.  Family Communication:  Spoke with sister, Lelan Pons, by phone at [phone]  Disposition Plan:   Patient is from:  Home  Anticipated DC to:  Hospice, home versus facility  Anticipated DC date:  Pending clinical course   Consults called:  Neurology and neurosurgery consulted by EDP Admission status:  Inpatient, telemetry  Severity of Illness: The appropriate patient status for this patient is INPATIENT. Inpatient status is judged to be reasonable and necessary in order to provide the required intensity of service to ensure the patient's safety. The patient's presenting symptoms, physical exam findings, and initial radiographic and laboratory data in the context of their chronic comorbidities is felt to place them at high risk for further clinical deterioration. Furthermore, it is not anticipated that the patient will be medically stable for discharge from the hospital within 2 midnights of admission. The following factors support the patient status of inpatient.   " The patient's presenting symptoms include acute encephalopathy. " The worrisome physical exam findings include right-sided weakness, encephalopathy. " The initial radiographic and laboratory data are worrisome because of worsening edema of the left temporal lobe in the setting of known metastatic breast cancer with leptomeningeal dissemination. " The chronic co-morbidities include history of DVT.   * I certify that at the point of admission it is my clinical judgment that the patient will require inpatient hospital care spanning beyond 2 midnights from the point of admission due to high intensity of service, high risk for further deterioration and high frequency of surveillance required.Marcelyn Bruins MD [facility]  How to contact the [facility] Attending or Consulting provider [place] or covering provider during after hours [place], for this patient?   1. Check  the care team in [facility] and look for a) attending/consulting [place] provider listed and b) the [facility] team listed 2. Log into [email or web address] and use [place]'s universal password to access. If you do not have the password, please contact the hospital operator. 3. Locate the [facility] provider you are looking for under [facility] and page to a number that you can be directly reached. 4. If you still have difficulty reaching the provider, please page the [facility] (Director on Call) for the Hospitalists listed on amion for assistance.  08/16/2020, 12:53 AM

## 2020-08-15 NOTE — ED Notes (Signed)
Patient transported to MRI 

## 2020-08-15 NOTE — Progress Notes (Signed)
[facility] [place], [place] [address] [phone]   New Patient Evaluation  Date of Service: 08/15/20 Patient Name: Abigail [name] Patient MRN: [id number] Patient DOB: 03/18/1966 Provider: Ventura Sellers, MD  Identifying Statement:  Abigail [name] is a 55 y.o. female with Brain metastases ([facility]) [C79.31] who presents for initial consultation and evaluation regarding cancer associated neurologic deficits.    Referring Provider: Hoyt Koch, MD [address] [place],  [place] [address]  Primary Cancer:  Oncologic History: Oncology History  Breast cancer ([place])  11/02/2019 Initial Diagnosis   Breast cancer ([place])   11/02/2019 Cancer Staging   Staging form: Breast, AJCC 8th Edition - Clinical: Stage IV (cT4, cN2a, pM1, ER-, PR-, HER2-) - Signed by Ladell Pier, MD on 11/02/2019   11/14/2019 - 11/28/2019 Chemotherapy   The patient had PACLitaxel-protein bound (ABRAXANE) chemo infusion 175 mg, 100 mg/m2 = 175 mg, Intravenous,  Once, 1 of 4 cycles Administration: 175 mg (11/14/2019), 175 mg (11/21/2019), 175 mg (11/28/2019)  for chemotherapy treatment.    11/23/2019 Genetic Testing   Negative genetic testing on the common hereditary cancer panel.  The Common Hereditary Gene Panel offered by [facility] includes sequencing and/or deletion duplication testing of the following 48 genes: APC, ATM, AXIN2, BARD1, BMPR1A, BRCA1, BRCA2, BRIP1, CDH1, CDK4, CDKN2A (p14ARF), CDKN2A (p16INK4a), CHEK2, CTNNA1, DICER1, EPCAM (Deletion/duplication testing only), GREM1 (promoter region deletion/duplication testing only), KIT, MEN1, MLH1, MSH2, MSH3, MSH6, MUTYH, NBN, NF1, NHTL1, PALB2, PDGFRA, PMS2, POLD1, POLE, PTEN, RAD50, RAD51C, RAD51D, RNF43, SDHB, SDHC, SDHD, SMAD4, SMARCA4. STK11, TP53, TSC1, TSC2, and VHL.  The following genes were evaluated for sequence changes only: SDHA and HOXB13 c.251G>A variant only. The report date is November 23, 2019.   12/12/2019 - 04/18/2020 Chemotherapy   The patient had DOXOrubicin (ADRIAMYCIN) chemo injection 110 mg, 60 mg/m2 = 110 mg, Intravenous,  Once, 7 of 9 cycles Dose modification: 50 mg/m2 (original dose 60 mg/m2, Cycle 2, Reason: Provider Judgment) Administration: 110 mg (12/12/2019), 92 mg (01/02/2020), 92 mg (01/23/2020), 84 mg (02/13/2020), 84 mg (03/05/2020), 84 mg (03/26/2020), 84 mg (04/16/2020) palonosetron (ALOXI) injection 0.25 mg, 0.25 mg, Intravenous,  Once, 7 of 9 cycles Administration: 0.25 mg (12/12/2019), 0.25 mg (01/02/2020), 0.25 mg (01/23/2020), 0.25 mg (02/13/2020), 0.25 mg (03/05/2020), 0.25 mg (03/26/2020), 0.25 mg (04/16/2020) pegfilgrastim-cbqv (UDENYCA) injection 6 mg, 6 mg, Subcutaneous, Once, 7 of 9 cycles Administration: 6 mg (12/14/2019), 6 mg (01/04/2020), 6 mg (01/25/2020), 6 mg (02/15/2020), 6 mg (03/07/2020), 6 mg (03/28/2020), 6 mg (04/18/2020) cyclophosphamide (CYTOXAN) 1,100 mg in sodium chloride 0.9 % 250 mL chemo infusion, 600 mg/m2 = 1,100 mg, Intravenous,  Once, 7 of 9 cycles Dose modification: 500 mg/m2 (original dose 600 mg/m2, Cycle 2, Reason: Provider Judgment) Administration: 1,100 mg (12/12/2019), 920 mg (01/02/2020), 920 mg (01/23/2020), 840 mg (02/13/2020), 840 mg (03/05/2020), 840 mg (03/26/2020), 840 mg (04/16/2020) fosaprepitant (EMEND) 150 mg in sodium chloride 0.9 % 145 mL IVPB, 150 mg, Intravenous,  Once, 7 of 9 cycles Administration: 150 mg (12/12/2019), 150 mg (01/02/2020), 150 mg (01/23/2020), 150 mg (02/13/2020), 150 mg (03/05/2020), 150 mg (03/26/2020), 150 mg (04/16/2020)  for chemotherapy treatment.      History of Present Illness: The patient's records from the referring physician were obtained and reviewed and the patient interviewed to confirm this HPI.  Abigail [name] presented to neurologic attention this week after she and family noticed left eye blurry vision, and also some confusion and gait impairment.  These symptoms have been rapidly progressive over the past 5 days.  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  Over past 24 hours, she is very drowsy and struggling to answer questions appropriately.  Currently she is in a wheelchair, unable to perform ADLs.  She was able to undergo radiation this morning.  Unfortunately she is less than 1 week removed from her mother's passing.  Presents today with sister who provided history.  Medications: Current Outpatient Medications on File Prior to Visit  Medication Sig Dispense Refill  . capecitabine (XELODA) 500 MG tablet Take 1500 mg AM, Take 1500 mg PM for total dose of 3000 mg daily, Take for 14 days on and 7 days off. Repeat every 21 days 84 tablet 0  . dexamethasone (DECADRON) 4 MG tablet Take 1 tablet (4 mg total) by mouth 2 (two) times daily with a meal for 10 days. 20 tablet 0  . HYDROcodone-acetaminophen (NORCO/VICODIN) 5-325 MG tablet Take 1 tablet by mouth every 8 (eight) hours as needed for moderate pain or severe pain. 50 tablet 0  . lidocaine-prilocaine (EMLA) cream Apply 1 application topically as directed. Apply to port 1 hour prior to stick and cover with plastic wrap 30 g 5  . LORazepam (ATIVAN) 0.5 MG tablet Take one tablet po 30 min prior to MRI or radiation for claustrophobia 30 tablet 0  . memantine (NAMENDA) 10 MG tablet Take 1 tablet (10 mg total) by mouth 2 (two) times daily. Start after 4 weeks of titration 60 tablet 4  . memantine (NAMENDA) 5 MG tablet Start on 1st day of radiation. Week 1: take one po daily. Week 2: take one po twice daily. Week 3: take two po qam, and one po q pm. Week 4: take 2 po twice daily, and fill second prescription 70 tablet 0  . potassium chloride SA (KLOR-CON) 20 MEQ tablet Take 1 tablet (20 mEq total) by mouth daily. (Patient not taking: Reported on 02/13/2020) 30 tablet 1  . rivaroxaban (XARELTO) 20 MG TABS tablet Take 1 tablet (20 mg total) by  mouth daily with supper. 90 tablet 1   No current facility-administered medications on file prior to visit.    Allergies:  Allergies  Allergen Reactions  . Shellfish Allergy Itching and Swelling  . Sulfamethoxazole     REACTION: swelling   Past Medical History:  Past Medical History:  Diagnosis Date  . Allergic rhinitis   . Hyperlipidemia    LDL 150's '11   Past Surgical History:  Past Surgical History:  Procedure Laterality Date  . CESAREAN SECTION    . IR IMAGING GUIDED PORT INSERTION  11/07/2019  . IR PERC PLEURAL DRAIN W/INDWELL CATH W/IMG GUIDE  12/20/2019  . IR REMOVAL OF PLURAL CATH W/CUFF  04/09/2020  . IR THORACENTESIS ASP PLEURAL SPACE W/IMG GUIDE  11/09/2019  . IR US GUIDE BX ASP/DRAIN  10/29/2019   Social History:  Social History   Socioeconomic History  . Marital status: Single    Spouse name: Not on file  . Number of children: 3  . Years of education: 56  . Highest education level: Not on file  Occupational History  . Occupation: Armed forces training and education officer  Tobacco Use  . Smoking status: Never Smoker  . Smokeless tobacco: Never Used  Substance and Sexual Activity  . Alcohol use: Yes    Comment: rare use - twice rare  . Drug use:  No  . Sexual activity: Yes    Partners: Male    Birth control/protection: None  Other Topics Concern  . Not on file  Social History Narrative   HSG, [place]. Married '04, 1 dtr - '05, raising niece and nephew. Work - Chief Strategy Officer. Marriage in good. No history of abuse. Working on [place] education at [employer] (oct '12)   Social Determinants of [place] Strain:   . Difficulty of Paying Living Expenses: Not on file  Food Insecurity:   . Worried About Charity fundraiser in the Last Year: Not on file  . Ran Out of Food in the Last Year: Not on file  Transportation Needs:   . Lack of Transportation (Medical): Not on file  . Lack of Transportation (Non-Medical): Not on file  Physical  Activity:   . Days of Exercise per Week: Not on file  . Minutes of Exercise per Session: Not on file  Stress:   . Feeling of Stress : Not on file  Social Connections:   . Frequency of Communication with Friends and Family: Not on file  . Frequency of Social Gatherings with Friends and Family: Not on file  . Attends Religious Services: Not on file  . Active Member of Clubs or Organizations: Not on file  . Attends Archivist Meetings: Not on file  . Marital Status: Not on file  Intimate Partner Violence:   . Fear of Current or Ex-Partner: Not on file  . Emotionally Abused: Not on file  . Physically Abused: Not on file  . Sexually Abused: Not on file   Family History:  Family History  Problem Relation Age of Onset  . Diabetes Mother   . Hypertension Mother   . Hypertension Father   . Hypertension Sister   . Heart disease Sister        MI - no serious damage to heart  . Cancer Neg Hx     Review of Systems: UTO- altered mental status  Physical Exam: Vitals:   08/15/20 1212  BP: 124/81  Pulse: (!) 56  Resp: 18  Temp: 98 F (36.7 C)  SpO2: 100%   KPS: 50. General: Not alert, head slouched Head: Normal EENT: No conjunctival injection or scleral icterus.  Lungs: Resp effort normal Cardiac: Regular rate Abdomen: Non-distended abdomen Skin: No rashes cyanosis or petechiae. Extremities: No clubbing or edema  Neurologic Exam: Mental Status: Eyes closed, arouses to directed voice.  Does not maintain alertness or achieve attentiveness to examiner.  Answers some questions appropriately but unable to identify what part of body is the reason for visit or radiation treatment.  Cannot tell me her daughter's correct age.  Cranial Nerves: Visual acuity impaired left eye. Visual fields are full. Extra-ocular movements intact. L eye ptotic. Face is symmetric Motor: Both arms and legs move independently, some antigravity.  No clear asymmetry Sensory: Limited eval Gait: Non  ambulatory  Labs: I have reviewed the data as listed    Component Value Date/Time   NA 135 08/13/2020 1220   K 3.2 (L) 08/13/2020 1220   CL 93 (L) 08/13/2020 1220   CO2 29 08/13/2020 1220   GLUCOSE 119 (H) 08/13/2020 1220   GLUCOSE 91 08/29/2006 0841   BUN 12 08/13/2020 1220   CREATININE 0.52 08/13/2020 1220   CREATININE 0.65 08/05/2020 0842   CALCIUM 10.2 08/13/2020 1220   PROT 7.5 08/05/2020 0842   ALBUMIN 4.3 08/05/2020 0842   AST 28  08/05/2020 0842   ALT 27 08/05/2020 0842   ALKPHOS 113 08/05/2020 0842   BILITOT 0.8 08/05/2020 0842   GFRNONAA >60 08/13/2020 1220   GFRNONAA >60 08/05/2020 0842   GFRAA >60 07/11/2020 0831   Lab Results  Component Value Date   WBC 7.0 08/13/2020   NEUTROABS 5.4 08/13/2020   HGB 10.6 (L) 08/13/2020   HCT 35.1 (L) 08/13/2020   MCV 98.9 08/13/2020   PLT 185 08/13/2020    Imaging:  MR Brain W and Wo Contrast  Result Date: 08/13/2020 CLINICAL DATA:  Blurry vision left eye, reported abnormal finding eye doctor; history of breast cancer EXAM: MRI HEAD AND ORBITS WITHOUT AND WITH CONTRAST TECHNIQUE: Multiplanar, multiecho pulse sequences of the brain and surrounding structures were obtained without and with intravenous contrast. Multiplanar, multiecho pulse sequences of the orbits and surrounding structures were obtained including fat saturation techniques, before and after intravenous contrast administration. CONTRAST:  70m GADAVIST GADOBUTROL 1 MMOL/ML IV SOLN COMPARISON:  None. FINDINGS: MRI HEAD FINDINGS Brain: See series 16 for saved images. There is slightly nodular dural-based enhancement along the left sphenoid bone and middle cranial fossa. Mild edema is present in the underlying left temporal lobe. Suspected adjacent sulcal leptomeningeal enhancement. Dural enhancement extends posteriorly along the left tentorial insertion. Much less prominent dural-based enhancement is also likely present along the medial margin of the right middle cranial  fossa. There is abnormal enhancement within the internal auditory canals. There is also abnormal enhancement along the trigeminal nerves. Suspected abnormal leptomeningeal enhancement along the brainstem. Enhancement measuring 9 x 7 mm along the left anterior frontal convexity (series 16, image 19). There is no acute infarction or intracranial hemorrhage. Prominence of the ventricles and sulci reflects minor generalized parenchymal volume loss. Suspected mild disproportionate ventricular prominence. Patchy foci of T2 hyperintensity in the supratentorial white matter are nonspecific but may reflect minor chronic microvascular ischemic changes. Vascular: Major vessel flow voids at the skull base are preserved. Skull and upper cervical spine: Decreased and mildly heterogeneous T1 marrow signal, which may reflect metastatic disease. Suggestion of an expansile lesion of the right mandibular condyle. Other: Minimal patchy mastoid fluid opacification. Sella is unremarkable. MRI ORBITS FINDINGS Orbits: Asymmetric enhancement of the left optic nerve sheath. Globes, extraocular muscles, and lacrimal glands are symmetric and unremarkable. Visualized sinuses: Minor mucosal thickening Soft tissues: Unremarkable. IMPRESSION: Abnormal dural-based enhancement in the left middle cranial fossa extending posteriorly along the left tentorial insertion. Mild underlying temporal lobe edema. Asymmetric enhancement along the left optic nerve sheath likely reflects same process. Likely reflects metastatic disease. Subcentimeter enhancement along the left anterior frontal convexity may be parenchymal or extra-axial. There is abnormal leptomeningeal enhancement elsewhere also suspicious for metastatic disease. 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 Abnormal marrow signal suspicious for osseous metastatic disease. There is suspected extraosseous extension about the right mandibular condyle. Electronically Signed   By: PMacy MisM.D.   On: 08/13/2020 16:59   DG Hip Unilat W or W/O Pelvis 1 View Right  Result Date: 08/06/2020 CLINICAL DATA:  Metastatic breast cancer.  Right hip pain EXAM: DG HIP (WITH OR WITHOUT PELVIS) 1V RIGHT COMPARISON:  None. FINDINGS: Diffuse sclerotic metastatic disease throughout the pelvis and proximal femur bilaterally. Negative for fracture.  Right hip joint space normal. IMPRESSION: Widespread bony metastatic disease.  Negative for fracture. Electronically Signed   By: CFranchot GalloM.D.   On: 08/06/2020 14:11   DG FEMUR, MIN 2 VIEWS RIGHT  Result Date: 08/06/2020 CLINICAL DATA:  Metastatic breast cancer.  Right hip pain 3 weeks EXAM: RIGHT FEMUR 2 VIEWS COMPARISON:  None. FINDINGS: Patchy sclerotic metastatic disease is present in the right femur and throughout the pelvis. Normal right hip and right knee. Negative for fracture. IMPRESSION: Bony metastatic disease in the right femur.  Negative for fracture. Electronically Signed   By: Franchot Gallo M.D.   On: 08/06/2020 14:12   MR ORBITS W WO CONTRAST  Result Date: 08/13/2020 CLINICAL DATA:  Blurry vision left eye, reported abnormal finding eye doctor; history of breast cancer EXAM: MRI HEAD AND ORBITS WITHOUT AND WITH CONTRAST TECHNIQUE: Multiplanar, multiecho pulse sequences of the brain and surrounding structures were obtained without and with intravenous contrast. Multiplanar, multiecho pulse sequences of the orbits and surrounding structures were obtained including fat saturation techniques, before and after intravenous contrast administration. CONTRAST:  67m GADAVIST GADOBUTROL 1 MMOL/ML IV SOLN COMPARISON:  None. FINDINGS: MRI HEAD FINDINGS Brain: See series 16 for saved images. There is slightly nodular dural-based enhancement along the left sphenoid bone and middle cranial fossa. Mild edema is present in the underlying left temporal lobe. Suspected adjacent sulcal leptomeningeal enhancement. Dural enhancement extends posteriorly  along the left tentorial insertion. Much less prominent dural-based enhancement is also likely present along the medial margin of the right middle cranial fossa. There is abnormal enhancement within the internal auditory canals. There is also abnormal enhancement along the trigeminal nerves. Suspected abnormal leptomeningeal enhancement along the brainstem. Enhancement measuring 9 x 7 mm along the left anterior frontal convexity (series 16, image 19). There is no acute infarction or intracranial hemorrhage. Prominence of the ventricles and sulci reflects minor generalized parenchymal volume loss. Suspected mild disproportionate ventricular prominence. Patchy foci of T2 hyperintensity in the supratentorial white matter are nonspecific but may reflect minor chronic microvascular ischemic changes. Vascular: Major vessel flow voids at the skull base are preserved. Skull and upper cervical spine: Decreased and mildly heterogeneous T1 marrow signal, which may reflect metastatic disease. Suggestion of an expansile lesion of the right mandibular condyle. Other: Minimal patchy mastoid fluid opacification. Sella is unremarkable. MRI ORBITS FINDINGS Orbits: Asymmetric enhancement of the left optic nerve sheath. Globes, extraocular muscles, and lacrimal glands are symmetric and unremarkable. Visualized sinuses: Minor mucosal thickening Soft tissues: Unremarkable. IMPRESSION: Abnormal dural-based enhancement in the left middle cranial fossa extending posteriorly along the left tentorial insertion. Mild underlying temporal lobe edema. Asymmetric enhancement along the left optic nerve sheath likely reflects same process. Likely reflects metastatic disease. Subcentimeter enhancement along the left anterior frontal convexity may be parenchymal or extra-axial. There is abnormal leptomeningeal enhancement elsewhere also suspicious for metastatic disease. 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 Abnormal marrow signal  suspicious for osseous metastatic disease. There is suspected extraosseous extension about the right mandibular condyle. Electronically Signed   By: PMacy MisM.D.   On: 08/13/2020 16:59    CMoultonClinician Interpretation: I have personally reviewed the radiological images as listed.  My interpretation, in the context of the patient's clinical presentation, is progressive disease   Assessment/Plan Brain metastases ([facility] [C79.31]  GTanishka Droletpresents today with new focal neurologic deficits in the setting of what appears to be acute or subacute encephalopathy.  Encephalopathy is either metabolic in nature or secondary to dysfunction of brainstem arousal networks 2/2 aggressive leptomeningeal spread.  Extent of disease on brain MRI does not correlate fully with severity of clinical picture, but imaging may underestimate true extent of infiltration.       Our recommendations for her sister  today: -She wants to keep her out of the hospital if possible.  We suggested dosing her decadron, feeding her if possible, giving chance to perk up.  She did receive benzodiazepine yesterday and has had two radiation treatments (whole brain) to date. -If continues in this state, would recommend ED visit and admission to rule out metabolic causes of encephalopathy.   -If current condition is secondary to aggressive CNS infiltration of tumor, then end of life care should be initiated. -If further workup is not desired then hospice services can be arranged -Do not recommend following through with planned total spine MRI scheduled for tomorrow, regardless given her condition  Sister understands that even in most optimal scenario we do not have a cure/fix for leptomeningeal disease, and radiation therapy could provide some extension of lifespan at best.    She will collect family and continue with discussions regarding these difficult choices.  Unfortunately patient does not have awareness or insight at present to  make her own medical decisions.   We spent twenty additional minutes teaching regarding the natural history, biology, and historical experience in the treatment of neurologic complications of cancer.   We appreciate the opportunity to participate in the care of Abigail [name].  We are available for further support, direction, input.  All questions were answered. The patient knows to call the clinic with any problems, questions or concerns. No barriers to learning were detected.  The total time spent in the encounter was 40 minutes and more than 50% was on counseling and review of test results   Ventura Sellers, MD Medical Director of Neuro-Oncology [facility] [place] 08/15/20 12:22 PM

## 2020-08-15 NOTE — ED Notes (Signed)
Pt went to MRI.

## 2020-08-16 ENCOUNTER — Ambulatory Visit (HOSPITAL_COMMUNITY): Admission: RE | Admit: 2020-08-16 | Payer: 59 | Source: Ambulatory Visit

## 2020-08-16 DIAGNOSIS — Z7901 Long term (current) use of anticoagulants: Secondary | ICD-10-CM | POA: Diagnosis not present

## 2020-08-16 DIAGNOSIS — Z9221 Personal history of antineoplastic chemotherapy: Secondary | ICD-10-CM | POA: Diagnosis not present

## 2020-08-16 DIAGNOSIS — G9340 Encephalopathy, unspecified: Secondary | ICD-10-CM | POA: Diagnosis present

## 2020-08-16 DIAGNOSIS — Z515 Encounter for palliative care: Secondary | ICD-10-CM | POA: Diagnosis not present

## 2020-08-16 DIAGNOSIS — J309 Allergic rhinitis, unspecified: Secondary | ICD-10-CM | POA: Diagnosis present

## 2020-08-16 DIAGNOSIS — E876 Hypokalemia: Secondary | ICD-10-CM | POA: Diagnosis present

## 2020-08-16 DIAGNOSIS — R400 Somnolence: Secondary | ICD-10-CM | POA: Diagnosis present

## 2020-08-16 DIAGNOSIS — Z66 Do not resuscitate: Secondary | ICD-10-CM | POA: Diagnosis present

## 2020-08-16 DIAGNOSIS — Z7189 Other specified counseling: Secondary | ICD-10-CM | POA: Diagnosis not present

## 2020-08-16 DIAGNOSIS — Z20822 Contact with and (suspected) exposure to covid-19: Secondary | ICD-10-CM | POA: Diagnosis present

## 2020-08-16 DIAGNOSIS — C7949 Secondary malignant neoplasm of other parts of nervous system: Secondary | ICD-10-CM | POA: Diagnosis present

## 2020-08-16 DIAGNOSIS — C50919 Malignant neoplasm of unspecified site of unspecified female breast: Secondary | ICD-10-CM | POA: Diagnosis not present

## 2020-08-16 DIAGNOSIS — Z79899 Other long term (current) drug therapy: Secondary | ICD-10-CM | POA: Diagnosis not present

## 2020-08-16 DIAGNOSIS — H538 Other visual disturbances: Secondary | ICD-10-CM | POA: Diagnosis present

## 2020-08-16 DIAGNOSIS — E785 Hyperlipidemia, unspecified: Secondary | ICD-10-CM | POA: Diagnosis present

## 2020-08-16 DIAGNOSIS — R569 Unspecified convulsions: Secondary | ICD-10-CM | POA: Diagnosis present

## 2020-08-16 DIAGNOSIS — Z91013 Allergy to seafood: Secondary | ICD-10-CM | POA: Diagnosis not present

## 2020-08-16 DIAGNOSIS — C7951 Secondary malignant neoplasm of bone: Secondary | ICD-10-CM | POA: Diagnosis present

## 2020-08-16 DIAGNOSIS — C50912 Malignant neoplasm of unspecified site of left female breast: Secondary | ICD-10-CM | POA: Diagnosis present

## 2020-08-16 DIAGNOSIS — Z86718 Personal history of other venous thrombosis and embolism: Secondary | ICD-10-CM | POA: Diagnosis not present

## 2020-08-16 DIAGNOSIS — C7931 Secondary malignant neoplasm of brain: Secondary | ICD-10-CM | POA: Diagnosis not present

## 2020-08-16 DIAGNOSIS — Z882 Allergy status to sulfonamides status: Secondary | ICD-10-CM | POA: Diagnosis not present

## 2020-08-16 LAB — COMPREHENSIVE METABOLIC PANEL
ALT: 37 U/L (ref 0–44)
AST: 35 U/L (ref 15–41)
Albumin: 3.2 g/dL — ABNORMAL LOW (ref 3.5–5.0)
Alkaline Phosphatase: 99 U/L (ref 38–126)
Anion gap: 14 (ref 5–15)
BUN: 12 mg/dL (ref 6–20)
CO2: 28 mmol/L (ref 22–32)
Calcium: 9.7 mg/dL (ref 8.9–10.3)
Chloride: 91 mmol/L — ABNORMAL LOW (ref 98–111)
Creatinine, Ser: 0.63 mg/dL (ref 0.44–1.00)
GFR, Estimated: >60 mL/min (ref >60)
Glucose, Bld: 106 mg/dL — ABNORMAL HIGH (ref 70–99)
Potassium: 3.5 mmol/L (ref 3.5–5.1)
Sodium: 133 mmol/L — ABNORMAL LOW (ref 135–145)
Total Bilirubin: 1.2 mg/dL (ref 0.3–1.2)
Total Protein: 6.7 g/dL (ref 6.5–8.1)

## 2020-08-16 LAB — RESPIRATORY PANEL BY RT PCR (FLU A&B, COVID)
Influenza A by PCR: NEGATIVE
Influenza B by PCR: NEGATIVE
SARS Coronavirus 2 by RT PCR: NEGATIVE

## 2020-08-16 LAB — CBC
HCT: 29.5 % — ABNORMAL LOW (ref 36.0–46.0)
Hemoglobin: 9.5 g/dL — ABNORMAL LOW (ref 12.0–15.0)
MCH: 30.2 pg (ref 26.0–34.0)
MCHC: 32.2 g/dL (ref 30.0–36.0)
MCV: 93.7 fL (ref 80.0–100.0)
Platelets: 202 10*3/uL (ref 150–400)
RBC: 3.15 MIL/uL — ABNORMAL LOW (ref 3.87–5.11)
RDW: 18.6 % — ABNORMAL HIGH (ref 11.5–15.5)
WBC: 8.3 10*3/uL (ref 4.0–10.5)
nRBC: 0 % (ref 0.0–0.2)

## 2020-08-16 LAB — T4, FREE: Free T4: 1.13 ng/dL — ABNORMAL HIGH (ref 0.61–1.12)

## 2020-08-16 LAB — MAGNESIUM: Magnesium: 1.5 mg/dL — ABNORMAL LOW (ref 1.7–2.4)

## 2020-08-16 MED ORDER — RIVAROXABAN 20 MG PO TABS
20.0000 mg | ORAL_TABLET | Freq: Every day | ORAL | Status: DC
  Filled 2020-08-16: qty 1

## 2020-08-16 MED ORDER — SODIUM CHLORIDE 0.9% FLUSH
3.0000 mL | Freq: Two times a day (BID) | INTRAVENOUS | Status: DC
  Administered 2020-08-16 – 2020-08-19 (×5): 3 mL via INTRAVENOUS

## 2020-08-16 MED ORDER — ACETAMINOPHEN 325 MG PO TABS: 650.0000 mg | ORAL_TABLET | Freq: Four times a day (QID) | ORAL | Status: DC | PRN

## 2020-08-16 MED ORDER — ACETAMINOPHEN 650 MG RE SUPP: 650.0000 mg | Freq: Four times a day (QID) | RECTAL | Status: DC | PRN

## 2020-08-16 MED ORDER — HYDROCODONE-ACETAMINOPHEN 5-325 MG PO TABS
1.0000 | ORAL_TABLET | Freq: Three times a day (TID) | ORAL | Status: DC | PRN
  Administered 2020-08-19: 1 via ORAL
  Filled 2020-08-16 (×2): qty 1

## 2020-08-16 MED ORDER — RIVAROXABAN 20 MG PO TABS: 20.0000 mg | ORAL_TABLET | Freq: Every day | ORAL | Status: DC

## 2020-08-16 MED ORDER — MORPHINE SULFATE (PF) 2 MG/ML IV SOLN
2.0000 mg | INTRAVENOUS | Status: DC | PRN
  Administered 2020-08-17 – 2020-08-18 (×3): 2 mg via INTRAVENOUS
  Filled 2020-08-16 (×5): qty 1

## 2020-08-16 MED ORDER — LEVETIRACETAM IN NACL 500 MG/100ML IV SOLN
500.0000 mg | Freq: Two times a day (BID) | INTRAVENOUS | Status: DC
  Administered 2020-08-16 – 2020-08-19 (×7): 500 mg via INTRAVENOUS
  Filled 2020-08-16 (×7): qty 100

## 2020-08-16 MED ORDER — SODIUM CHLORIDE 0.9 % IV SOLN
INTRAVENOUS | Status: DC | PRN
  Administered 2020-08-16: 500 mL via INTRAVENOUS

## 2020-08-16 MED ORDER — DEXAMETHASONE SODIUM PHOSPHATE 4 MG/ML IJ SOLN
4.0000 mg | Freq: Three times a day (TID) | INTRAMUSCULAR | Status: DC
  Administered 2020-08-17 – 2020-08-19 (×8): 4 mg via INTRAVENOUS
  Filled 2020-08-16 (×8): qty 1

## 2020-08-16 MED ORDER — POTASSIUM CHLORIDE 10 MEQ/100ML IV SOLN
10.0000 meq | INTRAVENOUS | Status: AC
  Administered 2020-08-16 (×4): 10 meq via INTRAVENOUS
  Filled 2020-08-16 (×4): qty 100

## 2020-08-16 MED ORDER — LORAZEPAM 2 MG/ML IJ SOLN: 1.0000 mg | Freq: Once | INTRAMUSCULAR | Status: AC

## 2020-08-16 MED ORDER — LORAZEPAM 2 MG/ML IJ SOLN
INTRAMUSCULAR | Status: AC
  Administered 2020-08-16: 1 mg via INTRAVENOUS
  Filled 2020-08-16: qty 1

## 2020-08-16 MED ORDER — MAGNESIUM SULFATE IN D5W 1-5 GM/100ML-% IV SOLN
1.0000 g | Freq: Once | INTRAVENOUS | Status: AC
  Administered 2020-08-16: 1 g via INTRAVENOUS
  Filled 2020-08-16: qty 100

## 2020-08-16 NOTE — ED Notes (Signed)
Report given to Irene RN

## 2020-08-16 NOTE — ED Notes (Signed)
Pt moved to ED 37; report given.

## 2020-08-16 NOTE — Progress Notes (Signed)
Patient had not voided since arriving to unit. Family reports patient did not void at all yesterday as well. Bladder scanned showing 468ml. Notified Dr. Posey Pronto. Received verbal order to place foley at this time. Sister at bedside.

## 2020-08-16 NOTE — Progress Notes (Signed)
PROGRESS NOTE    Abigail [name]  QAS:[id number] DOB: [birth year] DOA: 08/15/2020  PCP: Hoyt Koch, MD    Brief Narrative:  HPI: Abigail [name] is a 54 y.o. 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  For the past week she is experienced worsening blurry vision of left eye, confusion, and gait changes.  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  She was seen at oncology earlier today and they attempted to keep her out of the hospital by treating outpatient with Decadron.  And recommended if patient remained in a state that she presented to the clinic in that she presented to the ED for admission and rule out of metabolic causes of encephalopathy.  Also recommended at end-of-life care should be initiated if her current neurologic symptoms are due to her neurologic spread of her cancer.  This was expressed to the patient's sister as she was accompanying in caring for the patient. After leaving the oncologist office, patient continued to do poorly and never perked up per her sister's report.  So she was brought to the ED for further evaluation and treatment.     ED Course: Vital signs significant for tachypnea to the 20s. Lab work showed mild hyponatremia to 133, K3.2, albumin 3.3, hemoglobin 9.6 (baseline 10.5).  Ammonia within normal limits.  TSH mildly low at 0.3.  Urinalysis, urine culture and respiratory panel ordered but not resulted.  Chest x-ray was without acute abnormality.  MRI showed known brain mets with leptomeningeal spread and increase in temporal edema on the left. EDP discussed with neurology and neurosurgery.   Assessment & Plan: Principal Problem:   Encephalopathy acute: Attribute to leptomeningeal spread and poor prognosis. Pt has had seizure while I was at bedside and me and sister were hold pt and nurse came immediately and pt  given ativan 1mg  iv.pt also started on keppra per neuro recommendations. Greatly appreciate neurology neurosurgery consults.    Breast cancer [facility]) Per oncology.    Hypokalemia Replaced and we will follow.   Hypomagnesia: Replaced 1 gm iv .     DVT prophylaxis: Xarelto Code Status: DNR Family Communication: Sister at bedside.  Disposition Plan: TBD  Status is: Inpatient  Remains inpatient appropriate because:Inpatient level of care appropriate due to severity of illness   Dispo: The patient is from: Home              Anticipated d/c is to: TBD              Anticipated d/c date is: 1 day              Patient currently is not medically stable to d/c.   Consultants:  Neurology. Neurosurgery.  Procedures:  None  Subjective: Pt is unresponsive to commands.   Objective: Vitals:   08/16/20 0600 08/16/20 0634 08/16/20 0900 08/16/20 1610  BP: 130/77 132/76 128/71 116/79  Pulse: 65 65 66   Resp: (!) 24 18 18  (!) 24  Temp:  98.6 F (37 C)  98.8 F (37.1 C)  TempSrc:  Oral  Axillary  SpO2: 100% 100% 99%    SpO2: 99 % O2 Flow Rate (L/min): 2 L/min  Intake/Output Summary (Last 24 hours) at 08/16/2020 1621 Last data filed at 08/16/2020 0301 Gross per 24 hour  Intake 100 ml  Output --  Net 100 ml   There were no vitals filed for this  visit.  Examination: Blood pressure 116/79, pulse 66, temperature 98.8 F (37.1 C), temperature source Axillary, resp. rate (!) 24, SpO2 99 %. General exam: Appears calm and comfortable  HEENT:EOMI, perrl  Respiratory system: Clear to auscultation. Respiratory effort normal. Cardiovascular system: S1 & S2 heard, RRR. No JVD, murmurs, rubs, gallops or clicks. No pedal edema. Gastrointestinal system: Abdomen is nondistended, soft and nontender. No organomegaly or masses felt. Normal bowel sounds heard. Central nervous system: Alert and oriented.CN grossly intact No focal neurological deficits. Extremities: pt moving all 4 ext and  ambulating. Skin: No rashes, lesions or ulcers Psychiatry: Judgement and insight appear normal. Mood & affect appropriate.     Data Reviewed: I have personally reviewed following labs and imaging studies  I/O last 3 completed shifts: In: 100 [IV Piggyback:100] Out: -  No intake/output data recorded. Lab Results  Component Value Date   CREATININE 0.63 08/16/2020   CREATININE 0.59 08/15/2020   CREATININE 0.52 08/13/2020   CBC: Recent Labs  Lab 08/13/20 1220 08/15/20 2044 08/16/20 0345  WBC 7.0 7.3 8.3  NEUTROABS 5.4 5.8  --   HGB 10.6* 9.6* 9.5*  HCT 35.1* 30.1* 29.5*  MCV 98.9 94.4 93.7  PLT 185 204 937   Basic Metabolic Panel: Recent Labs  Lab 08/13/20 1220 08/15/20 2044 08/16/20 0345  NA 135 133* 133*  K 3.2* 3.2* 3.5  CL 93* 90* 91*  CO2 29 29 28   GLUCOSE 119* 101* 106*  BUN 12 13 12   CREATININE 0.52 0.59 0.63  CALCIUM 10.2 10.0 9.7  MG  --   --  1.5*   GFR: Estimated Creatinine Clearance: 67.5 mL/min (by C-G formula based on SCr of 0.63 mg/dL). Liver Function Tests: Recent Labs  Lab 08/15/20 2044 08/16/20 0345  AST 34 35  ALT 35 37  ALKPHOS 94 99  BILITOT 0.9 1.2  PROT 6.7 6.7  ALBUMIN 3.3* 3.2*   No results for input(s): LIPASE, AMYLASE in the last 168 hours. Recent Labs  Lab 08/15/20 2044  AMMONIA 28    Coagulation Profile: Recent Labs  Lab 08/13/20 1720  INR 1.2   Cardiac Enzymes: No results for input(s): CKTOTAL, CKMB, CKMBINDEX, TROPONINI in the last 168 hours. BNP (last 3 results) No results for input(s): PROBNP in the last 8760 hours. HbA1C: No results for input(s): HGBA1C in the last 72 hours. CBG: No results for input(s): GLUCAP in the last 168 hours. Lipid Profile: No results for input(s): CHOL, HDL, LDLCALC, TRIG, CHOLHDL, LDLDIRECT in the last 72 hours. Thyroid Function Tests: Recent Labs    08/15/20 2044 08/16/20 0345  TSH 0.307*  --   FREET4  --  1.13*   Anemia Panel: No results for input(s): VITAMINB12,  FOLATE, FERRITIN, TIBC, IRON, RETICCTPCT in the last 72 hours. Sepsis Labs: No results for input(s): PROCALCITON, LATICACIDVEN in the last 168 hours.  Recent Results (from the past 240 hour(s))  Resp Panel by RT PCR (RSV, Flu A&B, Covid) - Nasopharyngeal Swab     Status: None   Collection Time: 08/13/20  2:33 PM   Specimen: Nasopharyngeal Swab  Result Value Ref Range Status   SARS Coronavirus 2 by RT PCR NEGATIVE NEGATIVE Final    Comment: (NOTE) SARS-CoV-2 target nucleic acids are NOT DETECTED.  The SARS-CoV-2 RNA is generally detectable in upper respiratoy specimens during the acute phase of infection. The lowest concentration of SARS-CoV-2 viral copies this assay can detect is 131 copies/mL. A negative result does not preclude SARS-Cov-2 infection and should not  be used as the sole basis for treatment or other patient management decisions. A negative result may occur with  improper specimen collection/handling, submission of specimen other than nasopharyngeal swab, presence of viral mutation(s) within the areas targeted by this assay, and inadequate number of viral copies (<131 copies/mL). A negative result must be combined with clinical observations, patient history, and epidemiological information. The expected result is Negative.  Fact Sheet for Patients:  PinkCheek.be  Fact Sheet for Healthcare Providers:  GravelBags.it  This test is no t yet approved or cleared by the [place] FDA and  has been authorized for detection and/or diagnosis of SARS-CoV-2 by FDA under an Emergency Use Authorization (EUA). This EUA will remain  in effect (meaning this test can be used) for the duration of the COVID-19 declaration under Section 564(b)(1) of the Act, 21 U.S.C. section 360bbb-3(b)(1), unless the authorization is terminated or revoked sooner.     Influenza A by PCR NEGATIVE NEGATIVE Final   Influenza B by PCR  NEGATIVE NEGATIVE Final    Comment: (NOTE) The Xpert Xpress SARS-CoV-2/FLU/RSV assay is intended as an aid in  the diagnosis of influenza from Nasopharyngeal swab specimens and  should not be used as a sole basis for treatment. Nasal washings and  aspirates are unacceptable for Xpert Xpress SARS-CoV-2/FLU/RSV  testing.  Fact Sheet for Patients: PinkCheek.be  Fact Sheet for Healthcare Providers: GravelBags.it  This test is not yet approved or cleared by the [place] FDA and  has been authorized for detection and/or diagnosis of SARS-CoV-2 by  FDA under an Emergency Use Authorization (EUA). This EUA will remain  in effect (meaning this test can be used) for the duration of the  Covid-19 declaration under Section 564(b)(1) of the Act, 21  U.S.C. section 360bbb-3(b)(1), unless the authorization is  terminated or revoked.    Respiratory Syncytial Virus by PCR NEGATIVE NEGATIVE Final    Comment: (NOTE) Fact Sheet for Patients: PinkCheek.be  Fact Sheet for Healthcare Providers: GravelBags.it  This test is not yet approved or cleared by the [place] FDA and  has been authorized for detection and/or diagnosis of SARS-CoV-2 by  FDA under an Emergency Use Authorization (EUA). This EUA will remain  in effect (meaning this test can be used) for the duration of the  COVID-19 declaration under Section 564(b)(1) of the Act, 21 U.S.C.  section 360bbb-3(b)(1), unless the authorization is terminated or  revoked. Performed at [facility], [place] [address] [place], [place] [address]   Respiratory Panel by RT PCR (Flu A&B, Covid) - Nasopharyngeal Swab     Status: None   Collection Time: 08/16/20  3:20 AM   Specimen: Nasopharyngeal Swab  Result Value Ref Range Status   SARS Coronavirus 2 by RT PCR NEGATIVE NEGATIVE Final    Comment: (NOTE) SARS-CoV-2 target nucleic  acids are NOT DETECTED.  The SARS-CoV-2 RNA is generally detectable in upper respiratoy specimens during the acute phase of infection. The lowest concentration of SARS-CoV-2 viral copies this assay can detect is 131 copies/mL. A negative result does not preclude SARS-Cov-2 infection and should not be used as the sole basis for treatment or other patient management decisions. A negative result may occur with  improper specimen collection/handling, submission of specimen other than nasopharyngeal swab, presence of viral mutation(s) within the areas targeted by this assay, and inadequate number of viral copies (<131 copies/mL). A negative result must be combined with clinical observations, patient history, and epidemiological information. The expected result is Negative.  Fact Sheet for Patients:  PinkCheek.be  Fact Sheet for Healthcare Providers:  GravelBags.it  This test is no t yet approved or cleared by the [place] FDA and  has been authorized for detection and/or diagnosis of SARS-CoV-2 by FDA under an Emergency Use Authorization (EUA). This EUA will remain  in effect (meaning this test can be used) for the duration of the COVID-19 declaration under Section 564(b)(1) of the Act, 21 U.S.C. section 360bbb-3(b)(1), unless the authorization is terminated or revoked sooner.     Influenza A by PCR NEGATIVE NEGATIVE Final   Influenza B by PCR NEGATIVE NEGATIVE Final    Comment: (NOTE) The Xpert Xpress SARS-CoV-2/FLU/RSV assay is intended as an aid in  the diagnosis of influenza from Nasopharyngeal swab specimens and  should not be used as a sole basis for treatment. Nasal washings and  aspirates are unacceptable for Xpert Xpress SARS-CoV-2/FLU/RSV  testing.  Fact Sheet for Patients: PinkCheek.be  Fact Sheet for Healthcare Providers: GravelBags.it  This test  is not yet approved or cleared by the [place] FDA and  has been authorized for detection and/or diagnosis of SARS-CoV-2 by  FDA under an Emergency Use Authorization (EUA). This EUA will remain  in effect (meaning this test can be used) for the duration of the  Covid-19 declaration under Section 564(b)(1) of the Act, 21  U.S.C. section 360bbb-3(b)(1), unless the authorization is  terminated or revoked. Performed at [facility], [place] [address], [place], [place] [address]       Radiology Studies: MR Brain W and Wo Contrast  Result Date: 08/15/2020 CLINICAL DATA:  Brain tumor follow-up.  History of breast cancer. EXAM: MRI HEAD WITHOUT AND WITH CONTRAST TECHNIQUE: Multiplanar, multiecho pulse sequences of the brain and surrounding structures were obtained without and with intravenous contrast. CONTRAST:  71mL GADAVIST GADOBUTROL 1 MMOL/ML IV SOLN COMPARISON:  MRI head with contrast 08/13/2020 FINDINGS: Brain: Motion degraded study. Abnormal dural nodular enhancement in the left middle cranial fossa similar to the prior study. This extends posteriorly along the tentorium. There is edema in the left anterior temporal lobe on FLAIR which is slightly more prominent. Smaller area of enhancement in the left frontal lobe anteriorly which appears extra-axial and likely is leptomeningeal and dural enhancement due to tumor. This also shows associated FLAIR hyperintensity in the left frontal lobe which is slightly more prominent. There is abnormal enhancement in the internal auditory canal bilaterally most likely due to tumor. There is enhancement in the left optic nerve a sheath due to leptomeningeal tumor spread. Motion degraded postcontrast image. No definite parenchymal metastatic deposit. Negative for acute infarct. Ventricle size slightly prominent but unchanged. Vascular: Normal arterial flow voids. Skull and upper cervical spine: Bone marrow diffusely low signal on T1 suggesting metastatic  disease. Enhancing lesion right mandibular condyle and ramus consistent with metastatic disease. Sinuses/Orbits: Patchy enhancement of the left optic nerve sheath compatible with tumor spread. No mass lesion in the orbit. Other: None IMPRESSION: Motion degraded study Again noted is metastatic disease to the dura in the left middle cranial fossa. Mild increase in edema in the left anterior temporal lobe. Similar leptomeningeal enhancement in the left frontal lobe with slight increase in edema in the adjacent left frontal lobe. 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 Bony metastatic disease to the calvarium and right mandibular condyle and ramus. Electronically Signed   By: Franchot Gallo M.D.   On: 08/15/2020 22:31  DG Chest Portable 1 View  Result Date: 08/15/2020 CLINICAL DATA:  Somnolence. Rule out infection leading to encephalopathy. EXAM: PORTABLE CHEST 1 VIEW COMPARISON:  Chest radiograph 12/19/2019.  Chest CT 03/24/2020 FINDINGS: Right chest port remains in place. PleurX catheter at the left lung base on prior chest CT is not definitively seen. There is no evidence of recurrent pleural effusion. No focal airspace disease. Heart is normal in size. No pneumothorax. Known osseous metastatic disease with sclerotic densities involving the shoulders. Many of the thoracic cage metastasis or radiographically occult. IMPRESSION: 1. No evidence of intrathoracic infection. 2. Left PleurX catheter on prior chest CT is not definitively seen. No evidence of recurrent pleural effusion. 3. Known osseous metastatic disease. Electronically Signed   By: Keith Rake M.D.   On: 08/15/2020 19:18      Current Facility-Administered Medications (Endocrine & Metabolic):  Derrill Memo ON 08/17/2020] dexamethasone (DECADRON) injection 4 mg       Current Facility-Administered Medications (Analgesics):  .  acetaminophen (TYLENOL) tablet 650 mg  **OR** acetaminophen (TYLENOL) suppository 650 mg.  HYDROcodone-acetaminophen (NORCO/VICODIN) 5-325 MG per tablet 1 tablet   Current Facility-Administered Medications (Hematological):  Marland Kitchen  [START ON 08/17/2020] rivaroxaban (XARELTO) tablet 20 mg   Current Facility-Administered Medications (Other):  Marland Kitchen  Chlorhexidine Gluconate Cloth 2 % PADS 6 each .  levETIRAcetam (KEPPRA) IVPB 500 mg/100 mL premix .  sodium chloride flush (NS) 0.9 % injection 10-40 mL .  sodium chloride flush (NS) 0.9 % injection 10-40 mL .  sodium chloride flush (NS) 0.9 % injection 3 mL  No current outpatient medications on file.  Anti-infectives (From admission, onward)   None      Scheduled Meds: . Chlorhexidine Gluconate Cloth  6 each Topical Daily  . [START ON 08/17/2020] dexamethasone (DECADRON) injection  4 mg Intravenous Q8H  . [START ON 08/17/2020] rivaroxaban  20 mg Oral Q supper  . sodium chloride flush  10-40 mL Intracatheter Q12H  . sodium chloride flush  3 mL Intravenous Q12H   Continuous Infusions: . levETIRAcetam 500 mg (08/16/20 1441)     LOS: 0 days    Para Skeans, MD [facility] Pager [phone] If 7PM-7AM, please contact night-coverage [email or web address] Password [email or web address] 08/16/2020, 4:21 PM

## 2020-08-16 NOTE — Progress Notes (Signed)
Niece and sister to bedside. They are requesting to speak to provider regarding plan of care and code status. Notified Dr. Posey Pronto. She will come to unit to see patient. Family updated.

## 2020-08-16 NOTE — ED Notes (Addendum)
Pt refused temperature. Clenched her teeth and wouldn't open. Pt also refused rectal temperature by shaking head no and refusing to let us turn her on her side or doing it herself.

## 2020-08-16 NOTE — Progress Notes (Signed)
Patient noted to be having seizure activity. Dr. Posey Pronto at bedside. Ativan 1mg  given IV. Pt biting her tongue with some blood noted. Oral suction provided with yankaeur. O2 increased to 4L by Dr. Posey Pronto. Seizure activity lasted approx 2 minutes.

## 2020-08-16 NOTE — Consult Note (Signed)
Neurology Consultation Reason for Consult: Rapid neurological decline Referring Physician: Florina Ou  CC: Rapid decline  History is obtained from: Family and chart review  HPI: Abigail [name] is a 54 y.o. female with a past medical history significant for breast cancer recently found to have leptomeningeal disease with a very rapid decline over a few days  See excellent note by Dr. Mickeal Skinner on 11/4 for details.  In brief due to her neurological decline, including eating less, talking less, a clear gaze preference and unilateral weakness, she and her family were advised that they should go to the ED if she continued to decline to rule out a treatable metabolic/infectious process, whereas if there was evidence for further growth of malignancy hospice care should be pursued.  Her work-up has been unremarkable with mildly depleted electrolyte levels such as magnesium of 1.5 expected in a patient who is not eating much.  Her MRI, which I personally reviewed has revealed expanding leptomeningeal disease and some worsening edema.  These results were reviewed with the family, who agreed that the patient would want to be comfort care at this time.  On review of systems, family denied any clear seizure activity such as rhythmic twitching of any parts of the patient's body  Given goals of care, I performed a limited exam, noting that the patient overall was resting comfortably, had a left gaze preference that she could not completely overcome.  She was able to say "okay" but did not appear to understand all of my commands.   I have reviewed labs in epic and the results pertinent to this consultation are: Magnesium of 1.5 as above  I have reviewed the images obtained: MRI brain as described above  Impression: This is a 53 year old woman with a past medical history significant for breast cancer with metastasis to the leptomeninges and rapid neurological decline and due to the same.  We discussed that while  she does not clearly have any motor seizures, one of the major areas of her brain involved, left temporal lobe, is a highly epileptogenic focus.  Family is in agreement to try aggressive IV dexamethasone to try to reduce edema and improve the patient's ability to interact with them as well as Keppra to treat any potential focal seizures contributing to aphasia again in an attempt to maximize quality of life.  Should the [place] make the patient too sleepy should be discontinued  Recommendations: -Dexamethasone 10 mg IV followed by 4 mg every 8 hours -Keppra 500 mg twice daily -Appreciate primary team assistance with transition to comfort and palliative care -Neurology will be available on an as-needed basis going forward   [place] [phone]

## 2020-08-16 NOTE — Progress Notes (Signed)
Pt had episode of oxygen saturation decreasing to 65%. She was awake with eyes opened staring at staff and her sister. She appeared to be holding her breath. Mild sternal rub and vocal stimuli given, Placed on 4L O2NC as saturations slowly increased to mid 90's where they remained. O2 decreased to 2L. Will continue to monitor.

## 2020-08-16 NOTE — Consult Note (Signed)
Neurosurgery Consultation  Reason for Consult: AMS, brain metastases Referring Physician: Posey Pronto  CC: Altered mental status  HPI: [name] that presents with progressive altered mental status, blurry vision, and confusion. When I tried to discuss with her, she would only nod her head yes to all questions and say "uh huh" but no other answers, so further hx from the pt is unfortunately limited. From chart review, she has a history of breast cancer and has been followed by Dr. Mickeal Skinner. She saw him earlier this week with an increase in steroids but concern for progressive decline.    ROS: A 14 point ROS was performed and is negative except as noted in the HPI.   PMHx:  Past Medical History:  Diagnosis Date   Allergic rhinitis    Hyperlipidemia    LDL 150's '11   FamHx:  Family History  Problem Relation Age of Onset   Diabetes Mother    Hypertension Mother    Hypertension Father    Hypertension Sister    Heart disease Sister        MI - no serious damage to heart   Cancer Neg Hx    SocHx:  reports that she has never smoked. She has never used smokeless tobacco. She reports current alcohol use. She reports that she does not use drugs.  Exam: Vital signs in last 24 hours: Temp:  [98 F (36.7 C)-99.4 F (37.4 C)] 98.6 F (37 C) (11/06 0634) Pulse Rate:  [30-96] 66 (11/06 0900) Resp:  [12-31] 18 (11/06 0900) BP: (119-137)/(67-90) 128/71 (11/06 0900) SpO2:  [97 %-100 %] 99 % (11/06 0900) Weight:  [61.2 kg] 61.2 kg (11/05 1212) General: Somnolent, lying in bed in NAD Head: Normocephalic and atruamatic HEENT: Neck supple Pulmonary: breathing room air comfortably, no evidence of increased work of breathing Cardiac: RRR Abdomen: S NT ND Extremities: Warm and well perfused x4 Neuro: Somnolent, forces eyes closed with Bell's phenomenon when opened - therefore unable to evaluate pupillary reactivity or gaze deviation / conjugation, face grossly symmetric, does not FC,  only says "uh huh" to all questions, oriented x zero, MAEx4 without preference, squeezes hands reflexively but not reliably to command   Assessment and Plan: 54 y.o. woman with metastatic breast cancer with progressive AMS. MRI brain w/wo personally reviewed, which shows L tonsillar, L tentorial, L temporal tip, L frontal dural enhancement / thickening c/w leptomeningeal disease, also note of enhancement / thickening along the course of the L optic nerve sheath and likely involvement of the L>R IAC dura / 7-8 complex, no e/o hydrocephalus.   -no acute neurosurgical intervention indicated at this time, unfortunately a poor prognosis at this point in her disease, agree with ruling out metabolic causes that could be contributing to the progressive global decline.  -please call with any concerns or questions  Judith Part, MD 08/16/20 11:24 AM [facility]

## 2020-08-17 ENCOUNTER — Inpatient Hospital Stay (HOSPITAL_COMMUNITY): Payer: 59

## 2020-08-17 DIAGNOSIS — Z515 Encounter for palliative care: Secondary | ICD-10-CM

## 2020-08-17 DIAGNOSIS — C50919 Malignant neoplasm of unspecified site of unspecified female breast: Secondary | ICD-10-CM

## 2020-08-17 DIAGNOSIS — C7931 Secondary malignant neoplasm of brain: Principal | ICD-10-CM

## 2020-08-17 DIAGNOSIS — Z7189 Other specified counseling: Secondary | ICD-10-CM

## 2020-08-17 LAB — CBC WITH DIFFERENTIAL/PLATELET
Abs Immature Granulocytes: 0.03 10*3/uL (ref 0.00–0.07)
Basophils Absolute: 0 10*3/uL (ref 0.0–0.1)
Basophils Relative: 0 %
Eosinophils Absolute: 0 10*3/uL (ref 0.0–0.5)
Eosinophils Relative: 0 %
HCT: 31.7 % — ABNORMAL LOW (ref 36.0–46.0)
Hemoglobin: 10.1 g/dL — ABNORMAL LOW (ref 12.0–15.0)
Immature Granulocytes: 0 %
Lymphocytes Relative: 5 %
Lymphs Abs: 0.3 10*3/uL — ABNORMAL LOW (ref 0.7–4.0)
MCH: 29.6 pg (ref 26.0–34.0)
MCHC: 31.9 g/dL (ref 30.0–36.0)
MCV: 93 fL (ref 80.0–100.0)
Monocytes Absolute: 0.5 10*3/uL (ref 0.1–1.0)
Monocytes Relative: 6 %
Neutro Abs: 6.4 10*3/uL (ref 1.7–7.7)
Neutrophils Relative %: 89 %
Platelets: 231 10*3/uL (ref 150–400)
RBC: 3.41 MIL/uL — ABNORMAL LOW (ref 3.87–5.11)
RDW: 17.9 % — ABNORMAL HIGH (ref 11.5–15.5)
WBC: 7.2 10*3/uL (ref 4.0–10.5)
nRBC: 0 % (ref 0.0–0.2)

## 2020-08-17 LAB — COMPREHENSIVE METABOLIC PANEL
ALT: 36 U/L (ref 0–44)
AST: 28 U/L (ref 15–41)
Albumin: 2.9 g/dL — ABNORMAL LOW (ref 3.5–5.0)
Alkaline Phosphatase: 94 U/L (ref 38–126)
Anion gap: 11 (ref 5–15)
BUN: 18 mg/dL (ref 6–20)
CO2: 29 mmol/L (ref 22–32)
Calcium: 9.5 mg/dL (ref 8.9–10.3)
Chloride: 89 mmol/L — ABNORMAL LOW (ref 98–111)
Creatinine, Ser: 0.63 mg/dL (ref 0.44–1.00)
GFR, Estimated: >60 mL/min (ref >60)
Glucose, Bld: 111 mg/dL — ABNORMAL HIGH (ref 70–99)
Potassium: 3.3 mmol/L — ABNORMAL LOW (ref 3.5–5.1)
Sodium: 129 mmol/L — ABNORMAL LOW (ref 135–145)
Total Bilirubin: 1 mg/dL (ref 0.3–1.2)
Total Protein: 6.4 g/dL — ABNORMAL LOW (ref 6.5–8.1)

## 2020-08-17 LAB — URINALYSIS, ROUTINE W REFLEX MICROSCOPIC
Bacteria, UA: NONE SEEN
Bilirubin Urine: NEGATIVE
Glucose, UA: NEGATIVE mg/dL
Ketones, ur: 20 mg/dL — AB
Leukocytes,Ua: NEGATIVE
Nitrite: NEGATIVE
Protein, ur: 30 mg/dL — AB
Specific Gravity, Urine: 1.026 (ref 1.005–1.030)
pH: 5 (ref 5.0–8.0)

## 2020-08-17 LAB — PHOSPHORUS: Phosphorus: 3.8 mg/dL (ref 2.5–4.6)

## 2020-08-17 LAB — MAGNESIUM: Magnesium: 1.9 mg/dL (ref 1.7–2.4)

## 2020-08-17 IMAGING — DX DG CHEST 1V PORT
1 series · 1 of 1 positions shown · non-contrast
Comparison: [DATE]

CLINICAL DATA: Rhonchi at right lung base

EXAM:
PORTABLE CHEST 1 VIEW

[chest]
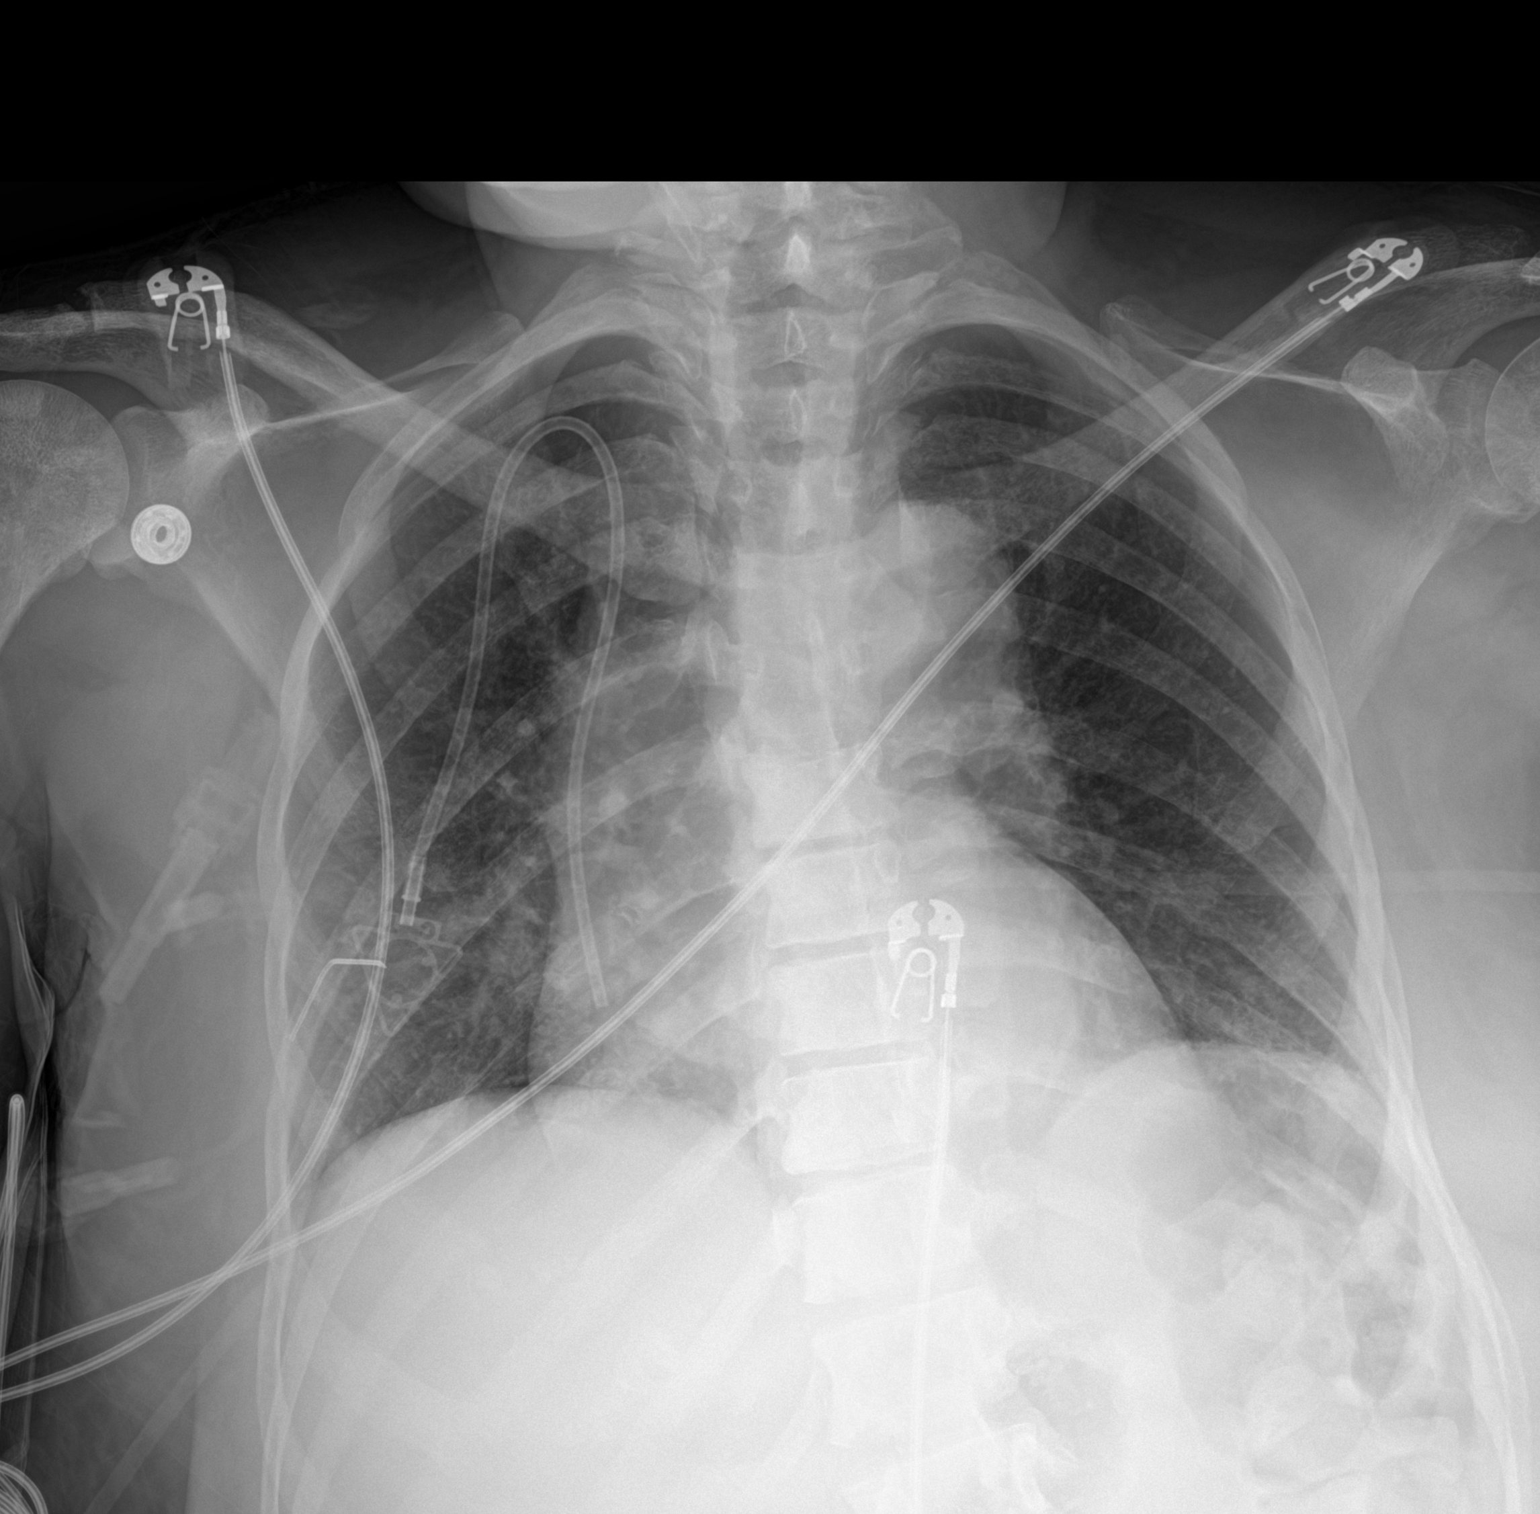

[1 of 1 positions shown; findings below may reference images not displayed]

FINDINGS: Single frontal view of the chest demonstrates a stable cardiac
silhouette. Chronic interstitial prominence throughout the lungs
compatible with scarring. No airspace disease, effusion, or
pneumothorax. No acute fractures. Stable diffuse bony metastases
unchanged. Right chest wall port unchanged.
IMPRESSION: 1. Chronic scarring, no acute process.
2. Stable bony metastatic disease.

## 2020-08-17 MED ORDER — SODIUM CHLORIDE 0.9 % IV SOLN: INTRAVENOUS | Status: DC | PRN

## 2020-08-17 NOTE — Progress Notes (Signed)
Pt's MEWS  Has been yellow, this is driven by her LOC which has not changed since her admission to the hospital

## 2020-08-17 NOTE — Progress Notes (Signed)
PROGRESS NOTE    Abigail [name]  JIR:[id number] DOB: [birth year] DOA: 08/15/2020  PCP: Hoyt Koch, MD    Brief Narrative:  HPI: Abigail [name] is a 54 y.o. 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  For the past week she is experienced worsening blurry vision of left eye, confusion, and gait changes.  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  She was seen at oncology earlier today and they attempted to keep her out of the hospital by treating outpatient with Decadron.  And recommended if patient remained in a state that she presented to the clinic in that she presented to the ED for admission and rule out of metabolic causes of encephalopathy.  Also recommended at end-of-life care should be initiated if her current neurologic symptoms are due to her neurologic spread of her cancer.  This was expressed to the patient's sister as she was accompanying in caring for the patient. After leaving the oncologist office, patient continued to do poorly and never perked up per her sister's report.  So she was brought to the ED for further evaluation and treatment.     ED Course: Vital signs significant for tachypnea to the 20s. Lab work showed mild hyponatremia to 133, K3.2, albumin 3.3, hemoglobin 9.6 (baseline 10.5).  Ammonia within normal limits.  TSH mildly low at 0.3.  Urinalysis, urine culture and respiratory panel ordered but not resulted.  Chest x-ray was without acute abnormality.  MRI showed known brain mets with leptomeningeal spread and increase in temporal edema on the left. EDP discussed with neurology and neurosurgery.   Assessment & Plan: Principal Problem:  Encephalopathy acute: Attribute to leptomeningeal spread and poor prognosis. Pt has had seizure while I was at bedside and me and sister were hold pt and nurse came immediately and pt  given ativan 1mg  iv.pt also started on keppra per neuro recommendations. Greatly appreciate neurology neurosurgery consults. 08/17/20 Pt is alert, awake and oriented but weak. I suspect the steroids are decreasing the swelling and therefore seizures.  Sister is very happy and relieved , I have d/w her that further care will now be based on  Oncology.    Breast cancer [facility]) Per oncology.   Hypokalemia Replaced and we will follow.   Hypomagnesia: Replaced 1 gm iv .    DVT prophylaxis: Xarelto Code Status: DNR Family Communication: Sister at bedside.  Disposition Plan: TBD  Status is: Inpatient  Remains inpatient appropriate because:Inpatient level of care appropriate due to severity of illness   Dispo: The patient is from: Home              Anticipated d/c is to: TBD              Anticipated d/c date is: 1 day              Patient currently is not medically stable to d/c.   Consultants:  Neurology. Neurosurgery.  Procedures:  None  Subjective: Pt is unresponsive to commands.   08/17/20 Pt is alert and awake and follows commands , prognosis still guarded and will wait for oncology plan for her cancer. Have also requested palliative care consult. Labs pending.   Objective: Vitals:   08/16/20 1908 08/16/20 2340 08/17/20 0340 08/17/20 0800  BP: 123/74 130/76 111/82   Pulse: 78 69 73 72  Resp: 18 10 20  (!) 22  Temp: 98.5 F (  36.9 C) 99 F (37.2 C) 98.5 F (36.9 C)   TempSrc: Axillary  Oral   SpO2: 100% 100% 99% 98%   SpO2: 98 % O2 Flow Rate (L/min): 2 L/min  Intake/Output Summary (Last 24 hours) at 08/17/2020 1452 Last data filed at 08/17/2020 0446 Gross per 24 hour  Intake 213.6 ml  Output 750 ml  Net -536.4 ml   There were no vitals filed for this visit.  Examination: Blood pressure 111/82, pulse 72, temperature 98.5 F (36.9 C), temperature source Oral, resp. rate (!) 22, SpO2 98 %. General exam: Appears calm and comfortable  HEENT:EOMI, perrl    Respiratory system: rt lung base rhonchi . Respiratory effort normal. Cardiovascular system: S1 & S2 heard, 2+systolic murmurs in aortic, rubs, gallops or clicks. No pedal edema. Gastrointestinal system: Abdomen is nondistended, soft and nontender. No organomegaly or masses felt. Normal bowel sounds heard. Central nervous system: Alert and oriented.CN grossly intact No focal neurological deficits. Extremities: pt moving all 4 ext and ambulating. Skin: No rashes, lesions or ulcers Psychiatry: Judgement and insight appear normal. Mood & affect appropriate.    Data Reviewed: I have personally reviewed following labs and imaging studies  I/O last 3 completed shifts: In: 313.6 [I.V.:13.6; IV Piggyback:300] Out: 750 [Urine:750] No intake/output data recorded. Lab Results  Component Value Date   CREATININE 0.63 08/16/2020   CREATININE 0.59 08/15/2020   CREATININE 0.52 08/13/2020   CBC: Recent Labs  Lab 08/13/20 1220 08/15/20 2044 08/16/20 0345  WBC 7.0 7.3 8.3  NEUTROABS 5.4 5.8  --   HGB 10.6* 9.6* 9.5*  HCT 35.1* 30.1* 29.5*  MCV 98.9 94.4 93.7  PLT 185 204 947   Basic Metabolic Panel: Recent Labs  Lab 08/13/20 1220 08/15/20 2044 08/16/20 0345  NA 135 133* 133*  K 3.2* 3.2* 3.5  CL 93* 90* 91*  CO2 29 29 28   GLUCOSE 119* 101* 106*  BUN 12 13 12   CREATININE 0.52 0.59 0.63  CALCIUM 10.2 10.0 9.7  MG  --   --  1.5*   GFR: Estimated Creatinine Clearance: 67.5 mL/min (by C-G formula based on SCr of 0.63 mg/dL). Liver Function Tests: Recent Labs  Lab 08/15/20 2044 08/16/20 0345  AST 34 35  ALT 35 37  ALKPHOS 94 99  BILITOT 0.9 1.2  PROT 6.7 6.7  ALBUMIN 3.3* 3.2*   No results for input(s): LIPASE, AMYLASE in the last 168 hours. Recent Labs  Lab 08/15/20 2044  AMMONIA 28    Coagulation Profile: Recent Labs  Lab 08/13/20 1720  INR 1.2   Cardiac Enzymes: No results for input(s): CKTOTAL, CKMB, CKMBINDEX, TROPONINI in the last 168 hours. BNP (last 3  results) No results for input(s): PROBNP in the last 8760 hours. HbA1C: No results for input(s): HGBA1C in the last 72 hours. CBG: No results for input(s): GLUCAP in the last 168 hours. Lipid Profile: No results for input(s): CHOL, HDL, LDLCALC, TRIG, CHOLHDL, LDLDIRECT in the last 72 hours. Thyroid Function Tests: Recent Labs    08/15/20 2044 08/16/20 0345  TSH 0.307*  --   FREET4  --  1.13*   Anemia Panel: No results for input(s): VITAMINB12, FOLATE, FERRITIN, TIBC, IRON, RETICCTPCT in the last 72 hours. Sepsis Labs: No results for input(s): PROCALCITON, LATICACIDVEN in the last 168 hours.  Recent Results (from the past 240 hour(s))  Resp Panel by RT PCR (RSV, Flu A&B, Covid) - Nasopharyngeal Swab     Status: None   Collection Time: 08/13/20  2:33  PM   Specimen: Nasopharyngeal Swab  Result Value Ref Range Status   SARS Coronavirus 2 by RT PCR NEGATIVE NEGATIVE Final    Comment: (NOTE) SARS-CoV-2 target nucleic acids are NOT DETECTED.  The SARS-CoV-2 RNA is generally detectable in upper respiratoy specimens during the acute phase of infection. The lowest concentration of SARS-CoV-2 viral copies this assay can detect is 131 copies/mL. A negative result does not preclude SARS-Cov-2 infection and should not be used as the sole basis for treatment or other patient management decisions. A negative result may occur with  improper specimen collection/handling, submission of specimen other than nasopharyngeal swab, presence of viral mutation(s) within the areas targeted by this assay, and inadequate number of viral copies (<131 copies/mL). A negative result must be combined with clinical observations, patient history, and epidemiological information. The expected result is Negative.  Fact Sheet for Patients:  PinkCheek.be  Fact Sheet for Healthcare Providers:  GravelBags.it  This test is no t yet approved or cleared by  the [place] FDA and  has been authorized for detection and/or diagnosis of SARS-CoV-2 by FDA under an Emergency Use Authorization (EUA). This EUA will remain  in effect (meaning this test can be used) for the duration of the COVID-19 declaration under Section 564(b)(1) of the Act, 21 U.S.C. section 360bbb-3(b)(1), unless the authorization is terminated or revoked sooner.     Influenza A by PCR NEGATIVE NEGATIVE Final   Influenza B by PCR NEGATIVE NEGATIVE Final    Comment: (NOTE) The Xpert Xpress SARS-CoV-2/FLU/RSV assay is intended as an aid in  the diagnosis of influenza from Nasopharyngeal swab specimens and  should not be used as a sole basis for treatment. Nasal washings and  aspirates are unacceptable for Xpert Xpress SARS-CoV-2/FLU/RSV  testing.  Fact Sheet for Patients: PinkCheek.be  Fact Sheet for Healthcare Providers: GravelBags.it  This test is not yet approved or cleared by the [place] FDA and  has been authorized for detection and/or diagnosis of SARS-CoV-2 by  FDA under an Emergency Use Authorization (EUA). This EUA will remain  in effect (meaning this test can be used) for the duration of the  Covid-19 declaration under Section 564(b)(1) of the Act, 21  U.S.C. section 360bbb-3(b)(1), unless the authorization is  terminated or revoked.    Respiratory Syncytial Virus by PCR NEGATIVE NEGATIVE Final    Comment: (NOTE) Fact Sheet for Patients: PinkCheek.be  Fact Sheet for Healthcare Providers: GravelBags.it  This test is not yet approved or cleared by the [place] FDA and  has been authorized for detection and/or diagnosis of SARS-CoV-2 by  FDA under an Emergency Use Authorization (EUA). This EUA will remain  in effect (meaning this test can be used) for the duration of the  COVID-19 declaration under Section 564(b)(1) of the Act, 21  U.S.C.  section 360bbb-3(b)(1), unless the authorization is terminated or  revoked. Performed at [facility], [place] [address] [place], [place] [address]   Respiratory Panel by RT PCR (Flu A&B, Covid) - Nasopharyngeal Swab     Status: None   Collection Time: 08/16/20  3:20 AM   Specimen: Nasopharyngeal Swab  Result Value Ref Range Status   SARS Coronavirus 2 by RT PCR NEGATIVE NEGATIVE Final    Comment: (NOTE) SARS-CoV-2 target nucleic acids are NOT DETECTED.  The SARS-CoV-2 RNA is generally detectable in upper respiratoy specimens during the acute phase of infection. The lowest concentration of SARS-CoV-2 viral copies this assay can detect is 131 copies/mL. A negative  result does not preclude SARS-Cov-2 infection and should not be used as the sole basis for treatment or other patient management decisions. A negative result may occur with  improper specimen collection/handling, submission of specimen other than nasopharyngeal swab, presence of viral mutation(s) within the areas targeted by this assay, and inadequate number of viral copies (<131 copies/mL). A negative result must be combined with clinical observations, patient history, and epidemiological information. The expected result is Negative.  Fact Sheet for Patients:  PinkCheek.be  Fact Sheet for Healthcare Providers:  GravelBags.it  This test is no t yet approved or cleared by the [place] FDA and  has been authorized for detection and/or diagnosis of SARS-CoV-2 by FDA under an Emergency Use Authorization (EUA). This EUA will remain  in effect (meaning this test can be used) for the duration of the COVID-19 declaration under Section 564(b)(1) of the Act, 21 U.S.C. section 360bbb-3(b)(1), unless the authorization is terminated or revoked sooner.     Influenza A by PCR NEGATIVE NEGATIVE Final   Influenza B by PCR NEGATIVE NEGATIVE Final    Comment:  (NOTE) The Xpert Xpress SARS-CoV-2/FLU/RSV assay is intended as an aid in  the diagnosis of influenza from Nasopharyngeal swab specimens and  should not be used as a sole basis for treatment. Nasal washings and  aspirates are unacceptable for Xpert Xpress SARS-CoV-2/FLU/RSV  testing.  Fact Sheet for Patients: PinkCheek.be  Fact Sheet for Healthcare Providers: GravelBags.it  This test is not yet approved or cleared by the [place] FDA and  has been authorized for detection and/or diagnosis of SARS-CoV-2 by  FDA under an Emergency Use Authorization (EUA). This EUA will remain  in effect (meaning this test can be used) for the duration of the  Covid-19 declaration under Section 564(b)(1) of the Act, 21  U.S.C. section 360bbb-3(b)(1), unless the authorization is  terminated or revoked. Performed at [facility], [place] [address], [place], [place] [address]       Radiology Studies: MR Brain W and Wo Contrast  Result Date: 08/15/2020 CLINICAL DATA:  Brain tumor follow-up.  History of breast cancer. EXAM: MRI HEAD WITHOUT AND WITH CONTRAST TECHNIQUE: Multiplanar, multiecho pulse sequences of the brain and surrounding structures were obtained without and with intravenous contrast. CONTRAST:  16mL GADAVIST GADOBUTROL 1 MMOL/ML IV SOLN COMPARISON:  MRI head with contrast 08/13/2020 FINDINGS: Brain: Motion degraded study. Abnormal dural nodular enhancement in the left middle cranial fossa similar to the prior study. This extends posteriorly along the tentorium. There is edema in the left anterior temporal lobe on FLAIR which is slightly more prominent. Smaller area of enhancement in the left frontal lobe anteriorly which appears extra-axial and likely is leptomeningeal and dural enhancement due to tumor. This also shows associated FLAIR hyperintensity in the left frontal lobe which is slightly more prominent. There is abnormal  enhancement in the internal auditory canal bilaterally most likely due to tumor. There is enhancement in the left optic nerve a sheath due to leptomeningeal tumor spread. Motion degraded postcontrast image. No definite parenchymal metastatic deposit. Negative for acute infarct. Ventricle size slightly prominent but unchanged. Vascular: Normal arterial flow voids. Skull and upper cervical spine: Bone marrow diffusely low signal on T1 suggesting metastatic disease. Enhancing lesion right mandibular condyle and ramus consistent with metastatic disease. Sinuses/Orbits: Patchy enhancement of the left optic nerve sheath compatible with tumor spread. No mass lesion in the orbit. Other: None IMPRESSION: Motion degraded study Again noted is metastatic disease to the dura  in the left middle cranial fossa. Mild increase in edema in the left anterior temporal lobe. Similar leptomeningeal enhancement in the left frontal lobe with slight increase in edema in the adjacent left frontal lobe. 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 Bony metastatic disease to the calvarium and right mandibular condyle and ramus. Electronically Signed   By: Franchot Gallo M.D.   On: 08/15/2020 22:31   DG Chest Portable 1 View  Result Date: 08/15/2020 CLINICAL DATA:  Somnolence. Rule out infection leading to encephalopathy. EXAM: PORTABLE CHEST 1 VIEW COMPARISON:  Chest radiograph 12/19/2019.  Chest CT 03/24/2020 FINDINGS: Right chest port remains in place. PleurX catheter at the left lung base on prior chest CT is not definitively seen. There is no evidence of recurrent pleural effusion. No focal airspace disease. Heart is normal in size. No pneumothorax. Known osseous metastatic disease with sclerotic densities involving the shoulders. Many of the thoracic cage metastasis or radiographically occult. IMPRESSION: 1. No evidence of intrathoracic infection. 2. Left PleurX  catheter on prior chest CT is not definitively seen. No evidence of recurrent pleural effusion. 3. Known osseous metastatic disease. Electronically Signed   By: Keith Rake M.D.   On: 08/15/2020 19:18      Current Facility-Administered Medications (Endocrine & Metabolic):  Derrill Memo ON 08/17/2020] dexamethasone (DECADRON) injection 4 mg       Current Facility-Administered Medications (Analgesics):  .  acetaminophen (TYLENOL) tablet 650 mg **OR** acetaminophen (TYLENOL) suppository 650 mg.  HYDROcodone-acetaminophen (NORCO/VICODIN) 5-325 MG per tablet 1 tablet   Current Facility-Administered Medications (Hematological):  Marland Kitchen  [START ON 08/17/2020] rivaroxaban (XARELTO) tablet 20 mg   Current Facility-Administered Medications (Other):  Marland Kitchen  Chlorhexidine Gluconate Cloth 2 % PADS 6 each .  levETIRAcetam (KEPPRA) IVPB 500 mg/100 mL premix .  sodium chloride flush (NS) 0.9 % injection 10-40 mL .  sodium chloride flush (NS) 0.9 % injection 10-40 mL .  sodium chloride flush (NS) 0.9 % injection 3 mL  No current outpatient medications on file.  Anti-infectives (From admission, onward)   None      Scheduled Meds: . Chlorhexidine Gluconate Cloth  6 each Topical Daily  . [START ON 08/17/2020] dexamethasone (DECADRON) injection  4 mg Intravenous Q8H  . [START ON 08/17/2020] rivaroxaban  20 mg Oral Q supper  . sodium chloride flush  10-40 mL Intracatheter Q12H  . sodium chloride flush  3 mL Intravenous Q12H   Continuous Infusions: . sodium chloride    . levETIRAcetam Stopped (08/17/20 0418)     LOS: 1 day    Para Skeans, MD [facility] Pager [phone] If 7PM-7AM, please contact night-coverage [email or web address] Password [email or web address] 08/17/2020, 2:52 PM

## 2020-08-17 NOTE — Progress Notes (Signed)
Pt has been more awake today than yesterday, however still extremely fatigued and sleeping most of the day. When awake it is brief but she is coherent and does answer simple questions with clear speech yet very soft spoken. Family has been at bedside all day. Palliative Care NP met with two family members in family room today. Pt required a dose of morphine for pain x 1 this shift. No seizure activity noted. Foley draining yellow, somewhat cloudy urine. Labs drawn and sent, portable cxr done as ordered, NS increased to 17m/hr. VSS with cardiac rhythm NS 70's. Pt has maintained oxygen sats greater than 95 on RA. Will continue to monitor and offer emotional support to family and patient.

## 2020-08-17 NOTE — Consult Note (Signed)
Consultation Note Date: 08/17/2020   Patient Name: Abigail [name]  DOB: Mar 14, 1966  MRN: [id number]  Age / Sex: 54 y.o., female  PCP: Abigail Koch, MD Referring Physician: Para Skeans, MD  Reason for Consultation: Establishing goals of care  HPI/Patient Profile: 54 y.o. 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 Witnessed seizure in ED.   Clinical Assessment and Goals of Care: I met today at [facility] bedside along with [name] sister, Abigail [name]. Abigail [name] reports that Abigail [name] is much improved from yesterday. She remains comfortable and without distress but arouses and speaks and answers questions. She answers mainly yes and no but does say thank you and short phrases. She is very weak and fatigued. Sister, Abigail [name], is at bedside and reports that niece, Abigail [name], is main contact and Media planner and Abigail [name] confirms.   I met with Abigail [name] and Abigail [name]'s brother as well. We spoke privately about the seriousness and aggressive nature of leptomeningeal spread of [name] cancer. I am worried that with Abigail [name] (they report that she has been struggling to swallow pills even at home lately) that time may be limited. We discussed watchful waiting over the next day or two to determine [name] ability to eat/drink, swallow pills, and alertness/wakefulness. If she continues to sleep most of the time and eat/drink very little I feel she could be a candidate for hospice facility. Family are invested in continuing steroids to attempt to give [name] the most alertness for [name] quality of life to spend time with [name] family. We also discussed preparing [name] 106 yo daughter and Abigail [name]  but she is not ready. We discussed use of [facility] and Abigail [name] agrees this would be helpful but [name]'s daughter may not be ready for this yet. From comments made it does seem that [name] daughter understands that [name] [name] in nearing end of life but not prepared to discuss this yet.   They are interested in making sure that Abigail [name] is surrounded by [name] loving family. They are hopeful to make [name] time left comfortable and the best possible. They are open to hospice options.   Primary Decision Maker Patient assisted by [name] niece, Abigail [name]    SUMMARY OF RECOMMENDATIONS   - DNR - Plans for hospice after optimization (hospice at home vs facility - family leaning towards facility if appropriate)  Code Status/Advance Care Planning:  DNR   Symptom Management:   Continue steroids and antiepileptics.   Morphine as needed.   Palliative Prophylaxis:   Aspiration, Delirium Protocol, Frequent Pain Assessment, Oral Care and Turn Reposition  Psycho-social/Spiritual:   Desire for further Chaplaincy support:yes  Additional Recommendations: Education on Hospice and Grief/Bereavement Support  Prognosis:   Poor prognosis. Likely weeks and appropriate for hospice facility. They had a good experience recently with Abigail [name] with Abigail [name] (funeral  was Monday).   Discharge Planning: To Be Determined      Primary Diagnoses: Present on Admission: . Encephalopathy acute . Breast cancer (Abigail [name]) . Hypokalemia   I have reviewed the medical record, interviewed the patient and family, and examined the patient. The following aspects are pertinent.  Past Medical History:  Diagnosis Date  . Allergic rhinitis   . Hyperlipidemia    LDL 150's '11   Social History   Socioeconomic History  . Marital status: Single    Spouse name: Not on file  . Number of children: 3  . Years of education: 70  . Highest education level: Not on file  Occupational History  . Occupation: Armed forces training and education officer    Tobacco Use  . Smoking status: Never Smoker  . Smokeless tobacco: Never Used  Substance and Sexual Activity  . Alcohol use: Yes    Comment: rare use - twice rare  . Drug use: No  . Sexual activity: Yes    Partners: Male    Birth control/protection: None  Other Topics Concern  . Not on file  Social History Narrative   HSG, [place]. Married '04, 1 dtr - '05, raising niece and nephew. Work - Chief Strategy Officer. Marriage in good. No history of abuse. Working on [place] education at [employer] (oct '12)   Social Determinants of [place] Strain:   . Difficulty of Paying Living Expenses: Not on file  Food Insecurity:   . Worried About Charity fundraiser in the Last Year: Not on file  . Ran Out of Food in the Last Year: Not on file  Transportation Needs:   . Lack of Transportation (Medical): Not on file  . Lack of Transportation (Non-Medical): Not on file  Physical Activity:   . Days of Exercise per Week: Not on file  . Minutes of Exercise per Session: Not on file  Stress:   . Feeling of Stress : Not on file  Social Connections:   . Frequency of Communication with Friends and Family: Not on file  . Frequency of Social Gatherings with Friends and Family: Not on file  . Attends Religious Services: Not on file  . Active Member of Clubs or Organizations: Not on file  . Attends Archivist Meetings: Not on file  . Marital Status: Not on file   Family History  Problem Relation Age of Onset  . Diabetes [name]   . Hypertension [name]   . Hypertension Father   . Hypertension Sister   . Heart disease Sister        MI - no serious damage to heart  . Cancer Neg Hx    Scheduled Meds: . Chlorhexidine Gluconate Cloth  6 each Topical Daily  . dexamethasone (DECADRON) injection  4 mg Intravenous Q8H  . rivaroxaban  20 mg Oral Q supper  . sodium chloride flush  10-40 mL Intracatheter Q12H  . sodium chloride flush  3 mL Intravenous Q12H    Continuous Infusions: . sodium chloride 10 mL/hr at 08/17/20 0446  . levETIRAcetam Stopped (08/17/20 0418)   PRN Meds:.sodium chloride, acetaminophen **OR** acetaminophen, HYDROcodone-acetaminophen, morphine injection, sodium chloride flush Allergies  Allergen Reactions  . Shellfish Allergy Itching and Swelling  . Sulfamethoxazole Swelling   Review of Systems  Constitutional: Positive for activity change, appetite change and fatigue.  Respiratory: Negative for shortness of breath.   Neurological: Positive for weakness.    Physical Exam Vitals and nursing note reviewed.  Constitutional:  General: She is not in acute distress.    Appearance: She is ill-appearing.  Cardiovascular:     Rate and Rhythm: Normal rate.  Pulmonary:     Effort: Pulmonary effort is normal. No tachypnea, accessory muscle usage or respiratory distress.  Abdominal:     General: Abdomen is flat.  Neurological:     Mental Status: She is easily aroused. She is lethargic.     Vital Signs: BP 111/82 (BP Location: Right Arm)   Pulse 73   Temp 98.5 F (36.9 C) (Oral)   Resp 20   SpO2 99%  Pain Scale: 0-10   Pain Score: 0-No pain   SpO2: SpO2: 99 % O2 Device:SpO2: 99 % O2 Flow Rate: .O2 Flow Rate (L/min): 2 L/min  IO: Intake/output summary:   Intake/Output Summary (Last 24 hours) at 08/17/2020 0918 Last data filed at 08/17/2020 0446 Gross per 24 hour  Intake 213.6 ml  Output 750 ml  Net -536.4 ml    LBM:   Baseline Weight:   Most recent weight:       Palliative Assessment/Data:     Time In/Out: 1030-1100, 1530-1610 Time Total: 70 min Greater than 50%  of this time was spent counseling and coordinating care related to the above assessment and plan.  Signed by: Vinie Sill, NP Palliative Medicine Team Pager # [phone] (M-F 8a-5p) Team Phone # [phone] (Nights/Weekends)

## 2020-08-18 ENCOUNTER — Ambulatory Visit: Payer: 59

## 2020-08-18 DIAGNOSIS — G9340 Encephalopathy, unspecified: Secondary | ICD-10-CM | POA: Diagnosis not present

## 2020-08-18 LAB — URINE CULTURE: Culture: NO GROWTH

## 2020-08-18 MED ORDER — POTASSIUM CHLORIDE 10 MEQ/100ML IV SOLN
10.0000 meq | INTRAVENOUS | Status: AC
  Administered 2020-08-18 (×2): 10 meq via INTRAVENOUS
  Filled 2020-08-18 (×2): qty 100

## 2020-08-18 MED ORDER — MORPHINE SULFATE (PF) 2 MG/ML IV SOLN
2.0000 mg | INTRAVENOUS | Status: DC | PRN
  Administered 2020-08-18 – 2020-08-19 (×3): 2 mg via INTRAVENOUS
  Filled 2020-08-18 (×5): qty 1

## 2020-08-18 MED ORDER — LORAZEPAM 2 MG/ML IJ SOLN: 2.0000 mg | INTRAMUSCULAR | Status: DC | PRN

## 2020-08-18 MED ORDER — GLYCOPYRROLATE 0.2 MG/ML IJ SOLN: 0.4000 mg | INTRAMUSCULAR | Status: DC | PRN

## 2020-08-18 NOTE — Progress Notes (Signed)
Palliative Medicine RN Note: Spoke with PMT NP Vinie Sill, who confirmed that pt is at end of life and meets current criteria for end of life visitation.   I spoke with Unit Director Langley Gauss, who will allow 4 at a time, and I notified Sunday Spillers at the front desk of the exception.  Marjie Skiff Lorieann Argueta, RN, BSN, [facility] Palliative Medicine Team 08/18/2020 11:26 AM Office [phone]

## 2020-08-18 NOTE — Progress Notes (Addendum)
HEMATOLOGY-ONCOLOGY PROGRESS NOTE  SUBJECTIVE: The patient presented to the hospital with worsening neurological symptoms.  Over the weekend, she has had some seizure activity.  Currently on Keppra and dexamethasone.  The patient opens her eyes and answer some basic questions.  Sister is at the bedside.  The patient's sister indicates that she has not seen any recurrent seizure activity. She states that the patient was much more lucid earlier today.  The patient tells me that she has a headache.  The patient knows her name, but thinks that she is in [facility] and that it is 56.  Oncology History  Breast cancer ([place])  11/02/2019 Initial Diagnosis   Breast cancer ([place])   11/02/2019 Cancer Staging   Staging form: Breast, AJCC 8th Edition - Clinical: Stage IV (cT4, cN2a, pM1, ER-, PR-, HER2-) - Signed by Ladell Pier, MD on 11/02/2019   11/14/2019 - 11/28/2019 Chemotherapy   The patient had PACLitaxel-protein bound (ABRAXANE) chemo infusion 175 mg, 100 mg/m2 = 175 mg, Intravenous,  Once, 1 of 4 cycles Administration: 175 mg (11/14/2019), 175 mg (11/21/2019), 175 mg (11/28/2019)  for chemotherapy treatment.    11/23/2019 Genetic Testing   Negative genetic testing on the common hereditary cancer panel.  The Common Hereditary Gene Panel offered by [facility] includes sequencing and/or deletion duplication testing of the following 48 genes: APC, ATM, AXIN2, BARD1, BMPR1A, BRCA1, BRCA2, BRIP1, CDH1, CDK4, CDKN2A (p14ARF), CDKN2A (p16INK4a), CHEK2, CTNNA1, DICER1, EPCAM (Deletion/duplication testing only), GREM1 (promoter region deletion/duplication testing only), KIT, MEN1, MLH1, MSH2, MSH3, MSH6, MUTYH, NBN, NF1, NHTL1, PALB2, PDGFRA, PMS2, POLD1, POLE, PTEN, RAD50, RAD51C, RAD51D, RNF43, SDHB, SDHC, SDHD, SMAD4, SMARCA4. STK11, TP53, TSC1, TSC2, and VHL.  The following genes were evaluated for sequence changes only: SDHA and HOXB13 c.251G>A variant only. The report date is November 23, 2019.   12/12/2019 -  04/18/2020 Chemotherapy   The patient had DOXOrubicin (ADRIAMYCIN) chemo injection 110 mg, 60 mg/m2 = 110 mg, Intravenous,  Once, 7 of 9 cycles Dose modification: 50 mg/m2 (original dose 60 mg/m2, Cycle 2, Reason: Provider Judgment) Administration: 110 mg (12/12/2019), 92 mg (01/02/2020), 92 mg (01/23/2020), 84 mg (02/13/2020), 84 mg (03/05/2020), 84 mg (03/26/2020), 84 mg (04/16/2020) palonosetron (ALOXI) injection 0.25 mg, 0.25 mg, Intravenous,  Once, 7 of 9 cycles Administration: 0.25 mg (12/12/2019), 0.25 mg (01/02/2020), 0.25 mg (01/23/2020), 0.25 mg (02/13/2020), 0.25 mg (03/05/2020), 0.25 mg (03/26/2020), 0.25 mg (04/16/2020) pegfilgrastim-cbqv (UDENYCA) injection 6 mg, 6 mg, Subcutaneous, Once, 7 of 9 cycles Administration: 6 mg (12/14/2019), 6 mg (01/04/2020), 6 mg (01/25/2020), 6 mg (02/15/2020), 6 mg (03/07/2020), 6 mg (03/28/2020), 6 mg (04/18/2020) cyclophosphamide (CYTOXAN) 1,100 mg in sodium chloride 0.9 % 250 mL chemo infusion, 600 mg/m2 = 1,100 mg, Intravenous,  Once, 7 of 9 cycles Dose modification: 500 mg/m2 (original dose 600 mg/m2, Cycle 2, Reason: Provider Judgment) Administration: 1,100 mg (12/12/2019), 920 mg (01/02/2020), 920 mg (01/23/2020), 840 mg (02/13/2020), 840 mg (03/05/2020), 840 mg (03/26/2020), 840 mg (04/16/2020) fosaprepitant (EMEND) 150 mg in sodium chloride 0.9 % 145 mL IVPB, 150 mg, Intravenous,  Once, 7 of 9 cycles Administration: 150 mg (12/12/2019), 150 mg (01/02/2020), 150 mg (01/23/2020), 150 mg (02/13/2020), 150 mg (03/05/2020), 150 mg (03/26/2020), 150 mg (04/16/2020)  for chemotherapy treatment.     PHYSICAL EXAMINATION:  Vitals:   08/17/20 1624 08/18/20 0726  BP: 113/77 109/72  Pulse: 70 68  Resp: 20 20  Temp: 98.8 F (37.1 C) 99.4 F (37.4 C)  SpO2: 96% 99%   There were no  vitals filed for this visit.  Intake/Output from previous day: 11/07 0701 - 11/08 0700 In: 485.7 [P.O.:120; I.V.:365.7] Out: 1000 [Urine:1000]  GENERAL: Alert, able to answer brief questions EYES: PERRL, the  patient did not consistently follow my finger to check EOMs so unable to fully assess LUNGS: clear to auscultation and percussion with normal breathing effort HEART: regular rate & rhythm and no murmurs and no lower extremity edema ABDOMEN:abdomen soft, non-tender and normal bowel sounds NEURO: Alert and able answer questions, oriented to person only.  LABORATORY DATA:  I have reviewed the data as listed CMP Latest Ref Rng & Units 08/17/2020 08/16/2020 08/15/2020  Glucose 70 - 99 mg/dL 111(H) 106(H) 101(H)  BUN 6 - 20 mg/dL '18 12 13  ' Creatinine 0.44 - 1.00 mg/dL 0.63 0.63 0.59  Sodium 135 - 145 mmol/L 129(L) 133(L) 133(L)  Potassium 3.5 - 5.1 mmol/L 3.3(L) 3.5 3.2(L)  Chloride 98 - 111 mmol/L 89(L) 91(L) 90(L)  CO2 22 - 32 mmol/L '29 28 29  ' Calcium 8.9 - 10.3 mg/dL 9.5 9.7 10.0  Total Protein 6.5 - 8.1 g/dL 6.4(L) 6.7 6.7  Total Bilirubin 0.3 - 1.2 mg/dL 1.0 1.2 0.9  Alkaline Phos 38 - 126 U/L 94 99 94  AST 15 - 41 U/L 28 35 34  ALT 0 - 44 U/L 36 37 35    Lab Results  Component Value Date   WBC 7.2 08/17/2020   HGB 10.1 (L) 08/17/2020   HCT 31.7 (L) 08/17/2020   MCV 93.0 08/17/2020   PLT 231 08/17/2020   NEUTROABS 6.4 08/17/2020    MR Brain W and Wo Contrast  Result Date: 08/15/2020 CLINICAL DATA:  Brain tumor follow-up.  History of breast cancer. EXAM: MRI HEAD WITHOUT AND WITH CONTRAST TECHNIQUE: Multiplanar, multiecho pulse sequences of the brain and surrounding structures were obtained without and with intravenous contrast. CONTRAST:  59m GADAVIST GADOBUTROL 1 MMOL/ML IV SOLN COMPARISON:  MRI head with contrast 08/13/2020 FINDINGS: Brain: Motion degraded study. Abnormal dural nodular enhancement in the left middle cranial fossa similar to the prior study. This extends posteriorly along the tentorium. There is edema in the left anterior temporal lobe on FLAIR which is slightly more prominent. Smaller area of enhancement in the left frontal lobe anteriorly which appears extra-axial  and likely is leptomeningeal and dural enhancement due to tumor. This also shows associated FLAIR hyperintensity in the left frontal lobe which is slightly more prominent. There is abnormal enhancement in the internal auditory canal bilaterally most likely due to tumor. There is enhancement in the left optic nerve a sheath due to leptomeningeal tumor spread. Motion degraded postcontrast image. No definite parenchymal metastatic deposit. Negative for acute infarct. Ventricle size slightly prominent but unchanged. Vascular: Normal arterial flow voids. Skull and upper cervical spine: Bone marrow diffusely low signal on T1 suggesting metastatic disease. Enhancing lesion right mandibular condyle and ramus consistent with metastatic disease. Sinuses/Orbits: Patchy enhancement of the left optic nerve sheath compatible with tumor spread. No mass lesion in the orbit. Other: None IMPRESSION: Motion degraded study Again noted is metastatic disease to the dura in the left middle cranial fossa. Mild increase in edema in the left anterior temporal lobe. Similar leptomeningeal enhancement in the left frontal lobe with slight increase in edema in the adjacent left frontal lobe. 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 Bony metastatic disease to the calvarium and right mandibular condyle and ramus. Electronically Signed   By: CJuanda Crumble  Carlis Abbott M.D.   On: 08/15/2020 22:31   MR Brain W and Wo Contrast  Result Date: 08/13/2020 CLINICAL DATA:  Blurry vision left eye, reported abnormal finding eye doctor; history of breast cancer EXAM: MRI HEAD AND ORBITS WITHOUT AND WITH CONTRAST TECHNIQUE: Multiplanar, multiecho pulse sequences of the brain and surrounding structures were obtained without and with intravenous contrast. Multiplanar, multiecho pulse sequences of the orbits and surrounding structures were obtained including fat saturation techniques, before and after  intravenous contrast administration. CONTRAST:  81m GADAVIST GADOBUTROL 1 MMOL/ML IV SOLN COMPARISON:  None. FINDINGS: MRI HEAD FINDINGS Brain: See series 16 for saved images. There is slightly nodular dural-based enhancement along the left sphenoid bone and middle cranial fossa. Mild edema is present in the underlying left temporal lobe. Suspected adjacent sulcal leptomeningeal enhancement. Dural enhancement extends posteriorly along the left tentorial insertion. Much less prominent dural-based enhancement is also likely present along the medial margin of the right middle cranial fossa. There is abnormal enhancement within the internal auditory canals. There is also abnormal enhancement along the trigeminal nerves. Suspected abnormal leptomeningeal enhancement along the brainstem. Enhancement measuring 9 x 7 mm along the left anterior frontal convexity (series 16, image 19). There is no acute infarction or intracranial hemorrhage. Prominence of the ventricles and sulci reflects minor generalized parenchymal volume loss. Suspected mild disproportionate ventricular prominence. Patchy foci of T2 hyperintensity in the supratentorial white matter are nonspecific but may reflect minor chronic microvascular ischemic changes. Vascular: Major vessel flow voids at the skull base are preserved. Skull and upper cervical spine: Decreased and mildly heterogeneous T1 marrow signal, which may reflect metastatic disease. Suggestion of an expansile lesion of the right mandibular condyle. Other: Minimal patchy mastoid fluid opacification. Sella is unremarkable. MRI ORBITS FINDINGS Orbits: Asymmetric enhancement of the left optic nerve sheath. Globes, extraocular muscles, and lacrimal glands are symmetric and unremarkable. Visualized sinuses: Minor mucosal thickening Soft tissues: Unremarkable. IMPRESSION: Abnormal dural-based enhancement in the left middle cranial fossa extending posteriorly along the left tentorial insertion. Mild  underlying temporal lobe edema. Asymmetric enhancement along the left optic nerve sheath likely reflects same process. Likely reflects metastatic disease. Subcentimeter enhancement along the left anterior frontal convexity may be parenchymal or extra-axial. There is abnormal leptomeningeal enhancement elsewhere also suspicious for metastatic disease. 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 Abnormal marrow signal suspicious for osseous metastatic disease. There is suspected extraosseous extension about the right mandibular condyle. Electronically Signed   By: PMacy MisM.D.   On: 08/13/2020 16:59   DG Chest Port 1 View  Result Date: 08/17/2020 CLINICAL DATA:  Rhonchi at right lung base EXAM: PORTABLE CHEST 1 VIEW COMPARISON:  08/15/2020 FINDINGS: Single frontal view of the chest demonstrates a stable cardiac silhouette. Chronic interstitial prominence throughout the lungs compatible with scarring. No airspace disease, effusion, or pneumothorax. No acute fractures. Stable diffuse bony metastases unchanged. Right chest wall port unchanged. IMPRESSION: 1. Chronic scarring, no acute process. 2. Stable bony metastatic disease. Electronically Signed   By: MRanda NgoM.D.   On: 08/17/2020 19:43   DG Chest Portable 1 View  Result Date: 08/15/2020 CLINICAL DATA:  Somnolence. Rule out infection leading to encephalopathy. EXAM: PORTABLE CHEST 1 VIEW COMPARISON:  Chest radiograph 12/19/2019.  Chest CT 03/24/2020 FINDINGS: Right chest port remains in place. PleurX catheter at the left lung base on prior chest CT is not definitively seen. There is no evidence of recurrent pleural effusion. No focal airspace disease. Heart is normal in size. No pneumothorax. Known osseous  metastatic disease with sclerotic densities involving the shoulders. Many of the thoracic cage metastasis or radiographically occult. IMPRESSION: 1. No evidence of intrathoracic infection. 2. Left PleurX catheter on prior chest CT is  not definitively seen. No evidence of recurrent pleural effusion. 3. Known osseous metastatic disease. Electronically Signed   By: Keith Rake M.D.   On: 08/15/2020 19:18   DG Hip Unilat W or W/O Pelvis 1 View Right  Result Date: 08/06/2020 CLINICAL DATA:  Metastatic breast cancer.  Right hip pain EXAM: DG HIP (WITH OR WITHOUT PELVIS) 1V RIGHT COMPARISON:  None. FINDINGS: Diffuse sclerotic metastatic disease throughout the pelvis and proximal femur bilaterally. Negative for fracture.  Right hip joint space normal. IMPRESSION: Widespread bony metastatic disease.  Negative for fracture. Electronically Signed   By: Franchot Gallo M.D.   On: 08/06/2020 14:11   DG FEMUR, MIN 2 VIEWS RIGHT  Result Date: 08/06/2020 CLINICAL DATA:  Metastatic breast cancer.  Right hip pain 3 weeks EXAM: RIGHT FEMUR 2 VIEWS COMPARISON:  None. FINDINGS: Patchy sclerotic metastatic disease is present in the right femur and throughout the pelvis. Normal right hip and right knee. Negative for fracture. IMPRESSION: Bony metastatic disease in the right femur.  Negative for fracture. Electronically Signed   By: Franchot Gallo M.D.   On: 08/06/2020 14:12   MR ORBITS W WO CONTRAST  Result Date: 08/13/2020 CLINICAL DATA:  Blurry vision left eye, reported abnormal finding eye doctor; history of breast cancer EXAM: MRI HEAD AND ORBITS WITHOUT AND WITH CONTRAST TECHNIQUE: Multiplanar, multiecho pulse sequences of the brain and surrounding structures were obtained without and with intravenous contrast. Multiplanar, multiecho pulse sequences of the orbits and surrounding structures were obtained including fat saturation techniques, before and after intravenous contrast administration. CONTRAST:  90m GADAVIST GADOBUTROL 1 MMOL/ML IV SOLN COMPARISON:  None. FINDINGS: MRI HEAD FINDINGS Brain: See series 16 for saved images. There is slightly nodular dural-based enhancement along the left sphenoid bone and middle cranial fossa. Mild edema is  present in the underlying left temporal lobe. Suspected adjacent sulcal leptomeningeal enhancement. Dural enhancement extends posteriorly along the left tentorial insertion. Much less prominent dural-based enhancement is also likely present along the medial margin of the right middle cranial fossa. There is abnormal enhancement within the internal auditory canals. There is also abnormal enhancement along the trigeminal nerves. Suspected abnormal leptomeningeal enhancement along the brainstem. Enhancement measuring 9 x 7 mm along the left anterior frontal convexity (series 16, image 19). There is no acute infarction or intracranial hemorrhage. Prominence of the ventricles and sulci reflects minor generalized parenchymal volume loss. Suspected mild disproportionate ventricular prominence. Patchy foci of T2 hyperintensity in the supratentorial white matter are nonspecific but may reflect minor chronic microvascular ischemic changes. Vascular: Major vessel flow voids at the skull base are preserved. Skull and upper cervical spine: Decreased and mildly heterogeneous T1 marrow signal, which may reflect metastatic disease. Suggestion of an expansile lesion of the right mandibular condyle. Other: Minimal patchy mastoid fluid opacification. Sella is unremarkable. MRI ORBITS FINDINGS Orbits: Asymmetric enhancement of the left optic nerve sheath. Globes, extraocular muscles, and lacrimal glands are symmetric and unremarkable. Visualized sinuses: Minor mucosal thickening Soft tissues: Unremarkable. IMPRESSION: Abnormal dural-based enhancement in the left middle cranial fossa extending posteriorly along the left tentorial insertion. Mild underlying temporal lobe edema. Asymmetric enhancement along the left optic nerve sheath likely reflects same process. Likely reflects metastatic disease. Subcentimeter enhancement along the left anterior frontal convexity may be parenchymal or extra-axial. There is  abnormal leptomeningeal  enhancement elsewhere also suspicious for metastatic disease. 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 Abnormal marrow signal suspicious for osseous metastatic disease. There is suspected extraosseous extension about the right mandibular condyle. Electronically Signed   By: Macy Mis M.D.   On: 08/13/2020 16:59    ASSESSMENT AND PLAN: Metastatic breast cancer  CT chest 10/25/2019-left breast mass left axillary lymph nodes, abnormal appearance of the left concerning for tumor involvement, mediastinal adenopathy, diffuse sclerotic and lytic lesions, left pleural effusion  CT abdomen/pelvis 10/26/2019 enhancing left breast mass with left axillary lymphadenopathy and tumor involved the left chest wall the latissimus dorsi segment 4B liver lesion, diffuse lytic/sclerotic lesions  Ultrasound-guided biopsy of left liver lesion 10/29/1999-metastatic carcinoma, cytokeratin andGATA3positive consistent with metastatic breast cancer. HER-2 negative, ER negative, PR negative, Ki-[id number]%. PD-L1 <1% expression   [facility] hereditary panel negative  Initiated systemic chemotherapy weekly abraxane 100 mg/m2 days 1, 8, 15 q28 days   Cycle 1 day 1 on 11/14/19  Cycle 1 day 8 on 11/21/19 (hg 7.5, receive 1 unit RBCs)  Cycle 1 day 15 on 11/28/2019  Cycle 1 Adriamycin/Cytoxan 12/12/2019  Cycle 2 Adriamycin/Cytoxan 01/02/2020  Cycle 3 Adriamycin/Cytoxan 01/23/2020  Cycle 4 Adriamycin/Cytoxan 02/13/2020  Cycle 5 Adriamycin/Cytoxan 03/05/2020  Cycle 6 Adriamycin/Cytoxan 03/26/2020  Restaging chest CT 03/24/2020-residual soft tissue lesion in the lateral left breast, partially imaged. Associated with improving soft tissue thickening, nodularity and stranding in the left axilla without a discrete measurable lesion. Resolved left pleural effusion with left chest tube in place. 2 mm right upper lobe nodule decreased in size from 10/25/2019. Diffuse osseous metastatic disease.  Cycle 7 Adriamycin/Cytoxan  04/16/2020  Cycle 1 Xeloda 05/15/2020  Cycle 2 Xeloda 06/05/2020  Cycle 3 Xeloda 06/26/2020  Cycle 4 Xeloda 07/17/2020  Xeloda discontinued 08/05/2020 due to progression 2. Left upper extremity DVT-left subclavian, axillary, and brachial vein thrombosis confirmed on  Anticoagulation with apixaban, changed to parenteral hospital admission1/14/2021transition to twice daily Lovenox at discharge; transition to once daily 11/08/2019.  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 Prophylactic ciprofloxacin initiated. [facility] admission 12/19/2019-acute respiratory failure with hypoxia, large left pleural effusion, ? HCAP, pancytopenia 10.  Hospital admission 08/15/2020-worsening neurologic status  Abigail [name] is now admitted for worsening neurologic status.  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  She is currently on dexamethasone and Keppra.  No recurrent seizure activity.  Discussed with family at the bedside that we do not have any good treatment options for leptomeningeal disease.  We discussed continued aggressive treatment with additional work-up including an LP versus focusing on comfort and transitioning to hospice.  The patient's family seems to be interested in transitioning to hospice and keeping her comfortable.  We discussed home with hospice versus residential hospice.  The family has  expressed interest in pursuing residential hospice.  Recommendations: 1.  Continue supportive care with steroids and antiseizure medications 2.  Pain medication as needed for headaches. 3.  Recommend TOC consult to make arrangements for hospice-home versus residential.   LOS: 2 days   Mikey Bussing, DNP, AGPCNP-BC, AOCNP 08/18/20 Abigail [name] was interviewed and examined.  The chart was reviewed.  Her sister and niece were at  the bedside.  Abigail [name] has metastatic breast cancer with recent systemic disease progression following Xeloda chemotherapy.  She was diagnosed with CNS disease last week after presenting with left eye vision loss.  She was noted to have leptomeningeal enhancement suspicious for metastatic disease.  She was admitted with worsening of her mental status on 08/15/2020.  A repeat MRI confirmed metastatic disease involving the dura of the left middle cranial fossa with leptomeningeal enhancement of the left frontal lobe and left optic nerve sheath.  She had a seizure over the weekend.  I discussed the poor prognosis with Abigail [name] and her family.  She is lethargic at present and follows some commands.  I explained the poor prognosis in the setting of hormone receptor negative, HER-2 negative metastatic breast cancer and metastatic disease involving the CNS, bones, skin, pleura, and liver.  Her prognosis for survival beyond weeks is very poor.  I recommend comfort care with hospice.  She is a candidate for [employer].  Her family is in agreement with hospice care.  Abigail [name] has altered mental status at present and needs help from her family to make medical decisions.

## 2020-08-18 NOTE — TOC Initial Note (Signed)
Transition of Care [facility]) - Initial/Assessment Note    Patient Details  Name: Abigail [name] MRN: [id number] Date of Birth: June 17, 1966  Transition of Care [facility]) CM/SW Contact:    Geralynn Ochs, LCSW Phone Number: 08/18/2020, 3:33 PM  Clinical Narrative:    CSW alerted by Palliative Medicine NP that patient's family has elected comfort care, with preference for [facility] for hospice services. CSW contacted Chrislyn to provide referral, and discussed the losses that the family has experienced recently as well as the patient having a young daughter that will likely struggle with the loss of the patient. [facility] to have grief counselors actively available for the patient's family to assist. No bed available at [facility] today, but CSW to continue to follow.               Expected Discharge Plan: [place] Barriers to Discharge: Hospice Bed not available   Patient Goals and CMS Choice Patient states their goals for this hospitalization and ongoing recovery are:: patient unable to participate in goal setting, family just wants to keep her comfortable CMS Medicare.gov Compare Post Acute Care list provided to:: Patient Represenative (must comment) Choice offered to / list presented to :  (niece)  Expected Discharge Plan and Services Expected Discharge Plan: [place]     Post Acute Care Choice: Hospice                                        Prior Living Arrangements/Services     Patient language and need for interpreter reviewed:: No Do you feel safe going back to the place where you live?: Yes      Need for Family Participation in Patient Care: Yes (Comment) Care giver support system in place?: No (comment)   Criminal Activity/Legal Involvement Pertinent to Current Situation/Hospitalization: No - Comment as needed  Activities of Daily Living      Permission Sought/Granted Permission sought to share information with : Facility  Sport and exercise psychologist, Family Supports Permission granted to share information with : Yes, Verbal Permission Granted  Share Information with NAME: Jerrol Banana  Permission granted to share info w AGENCY: International aid/development worker granted to share info w Relationship: Niece     Emotional Assessment   Attitude/Demeanor/Rapport: Unable to Assess Affect (typically observed): Unable to Assess        Admission diagnosis:  Somnolence [R40.0] Encephalopathy acute [G93.40] Patient Active Problem List   Diagnosis Date Noted  . Palliative care by specialist   . Encephalopathy acute 08/16/2020  . 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 Genetic testing 05/01/2020  . Acute respiratory failure with hypoxia ([place]) 12/19/2019  . Port-A-Cath in place 11/14/2019  . Goals of care, counseling/discussion 11/02/2019  . Breast cancer ([place]) 11/02/2019  . HCAP (healthcare-associated pneumonia) 10/25/2019  . 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 Breast mass, left 10/25/2019  . DVT (deep vein thrombosis) in pregnancy 10/25/2019  . Hypokalemia 10/25/2019  . Acute deep vein thrombosis (DVT) of axillary vein of left upper extremity ([place]) 10/19/2019  . Left arm pain 09/10/2019  . Routine health maintenance 08/04/2011  . Borderline hyperlipidemia 06/05/2010   PCP:  Hoyt Koch, MD Pharmacy:   [id number] [place], [place] [address] Phone: [phone] Fax: [phone]  [facility]  [place] [place] [address] Phone: [phone] Fax: [phone]  [facility] [place], [place] [address] Phone: [phone] Fax: [phone]     Social Determinants of Health (SDOH)  Interventions    Readmission Risk Interventions No flowsheet data found.

## 2020-08-18 NOTE — Progress Notes (Signed)
Palliative:  HPI: 54 y.o. 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 Witnessed seizure in ED.    I met today at [facility] bedside. Fortunately her niece/Shantee has just arrived to bedside and we were also joined by Dr. Benay Spice. Jailen is more alert and speech is better than yesterday which I attribute to benefits of steroids and time out from her last seizure. Dr. Benay Spice and Dr. Mickeal Skinner both noted to agree with transition to hospice facility. This is desire of family. Jerrol Banana is Air traffic controller and Meli agrees with transition to [facility] where she can get the care she needs. Although Creola continues to want to be cured and healed she seems to have understanding of her poor prognosis and although she wants to get better she also tells me that she is not worried or concerned about anything. We discussed that God will continue to take care of her and heal her when the time is right.   I spoke privately with [facility] and she would like to proceed with pursuing placement at [facility]. We would like to continue IV steroids as long as this is helping give Bailee meaningful moments with her family. She also feels that this is best as family recently cared for Annarae's mother who died recently (funeral was on 2022/12/20) and that caring for Zykira at home would be too overwhelming and also not in Kirbie's daughter's best interest (she is 5 yo). They will need maximum emotional support and [facility] referral. Discussed process of hospice referral and that we will wait to hear from them when they have a bed available and they will contact her to discuss further and complete paperwork. Until then we will continue with current level of care and providing comfort and surrounding Elliemae with her  loving family.   All questions/concerns addressed. Emotional support provided.   Exam: Awakens for longer periods of time today vs yesterday but still lethargic. She is speaking in more than 1-2 words today vs yesterday. Responses seem appropriate. She is drinking some fluids but has not had anything to eat since hospitalized. No distress. Cannot see through L eye. Breathing regular, unlabored. L breast tender to touch. L arm tender to touch.   Plan: - To [facility] when bed available.  - Continue IV decadron and continue at hospice for increased quality of life and symptom management. Continuing Keppra for seizure prevention also a comfort measure for Meliza.  - As needed medication in place to ensure comfort is continued if symptoms arise.   1 min  Vinie Sill, NP Palliative Medicine Team Pager [phone] (Please see [email or web address] for schedule) Team Phone [phone]    Greater than 50%  of this time was spent counseling and coordinating care related to the above assessment and plan

## 2020-08-18 NOTE — Plan of Care (Signed)
  Problem: Education: Goal: Knowledge of General Education information will improve Description: Including pain rating scale, medication(s)/side effects and non-pharmacologic comfort measures Outcome: Progressing   Problem: Clinical Measurements: Goal: Ability to maintain clinical measurements within normal limits will improve Outcome: Progressing Goal: Will remain free from infection Outcome: Progressing Goal: Diagnostic test results will improve Outcome: Progressing Goal: Respiratory complications will improve Outcome: Progressing Goal: Cardiovascular complication will be avoided Outcome: Progressing   Problem: Nutrition: Goal: Adequate nutrition will be maintained Outcome: Progressing   Problem: Elimination: Goal: Will not experience complications related to bowel motility Outcome: Progressing Goal: Will not experience complications related to urinary retention Outcome: Progressing   Problem: Pain Managment: Goal: General experience of comfort will improve Outcome: Progressing   Problem: Safety: Goal: Ability to remain free from injury will improve Outcome: Progressing   Problem: Skin Integrity: Goal: Risk for impaired skin integrity will decrease Outcome: Progressing

## 2020-08-18 NOTE — Progress Notes (Signed)
PROGRESS NOTE    Abigail [name]  EPP:[id number] DOB: 02-08-1966 DOA: 08/15/2020  PCP: Abigail Koch, MD    Brief Narrative:  HPI: Abigail [name] is a 54 y.o. 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  For the past week she is experienced worsening blurry vision of left eye, confusion, and gait changes.  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  She was seen at oncology earlier today and they attempted to keep her out of the hospital by treating outpatient with Decadron.  And recommended if patient remained in a state that she presented to the clinic in that she presented to the ED for admission and rule out of metabolic causes of encephalopathy.  Also recommended at end-of-life care should be initiated if her current neurologic symptoms are due to her neurologic spread of her cancer.  This was expressed to the patient's sister as she was accompanying in caring for the patient. After leaving the oncologist office, patient continued to do poorly and never perked up per her sister's report.  So she was brought to the ED for further evaluation and treatment.     ED Course: Vital signs significant for tachypnea to the 20s. Lab work showed mild hyponatremia to 133, K3.2, albumin 3.3, hemoglobin 9.6 (baseline 10.5).  Ammonia within normal limits.  TSH mildly low at 0.3.  Urinalysis, urine culture and respiratory panel ordered but not resulted.  Chest x-ray was without acute abnormality.  MRI showed known brain mets with leptomeningeal spread and increase in temporal edema on the left. EDP discussed with neurology and neurosurgery.   Assessment & Plan: Principal Problem:  Encephalopathy acute: Attribute to leptomeningeal spread and poor prognosis. Pt has had seizure while I was at bedside and me and sister were hold pt and nurse came immediately and pt  given ativan 1mg  iv.pt also started on keppra per neuro recommendations. Greatly appreciate neurology neurosurgery consults. 08/17/20 Pt is alert, awake and oriented but weak. I suspect the steroids are decreasing the swelling and therefore seizures.  Sister is very happy and relieved , I have d/w her that further care will now be based on  Oncology.  08/18/2020 Patient is somnolent was just given morphine earlier for pain.  Sister Abigail [name] is at bedside She states that she cannot make any decisions for her sister and she wants her to be treated.  She states that her niece is the vision maker in this and when they arrived she would like to talk to all her doctors, case managers.  Breast cancer [facility]) Per oncology.   Hypokalemia Replaced and we will follow.   Hypomagnesia: Replaced 1 gm iv .    DVT prophylaxis: Xarelto Code Status: DNR Family Communication: Sister at bedside.  Disposition Plan: TBD  Status is: Inpatient  Remains inpatient appropriate because:Inpatient level of care appropriate due to severity of illness   Dispo: The patient is from: Home              Anticipated d/c is to: TBD              Anticipated d/c date is: 1 day              Patient currently is not medically stable to d/c.   Consultants:  Neurology. Neurosurgery.  Procedures:  None  Subjective: Pt is unresponsive to commands.   08/17/20 Pt is alert and awake  and follows commands , prognosis still guarded and will wait for oncology plan for her cancer. Have also requested palliative care consult. Labs pending.   08/18/2020 [name] sister at bedside.  Chart reviewed and per palliative care note niece and family has decided for hospice.  We will continue to treat any pain and make [name] comfortable.  Objective: Vitals:   08/17/20 0800 08/17/20 1624 08/18/20 0726 08/18/20 1127  BP:  113/77 109/72 114/72  Pulse: 72 70 68 64  Resp: (!) 22 20 20 17   Temp:  98.8 F (37.1 C) 99.4 F  (37.4 C) 98.4 F (36.9 C)  TempSrc:  Axillary    SpO2: 98% 96% 99% 97%   SpO2: 97 % O2 Flow Rate (L/min): 2 L/min  Intake/Output Summary (Last 24 hours) at 08/18/2020 1622 Last data filed at 08/18/2020 1610 Gross per 24 hour  Intake --  Output 550 ml  Net -550 ml   There were no vitals filed for this visit.  Examination: Blood pressure 114/72, pulse 64, temperature 98.4 F (36.9 C), resp. rate 17, SpO2 97 %. General exam: Somnolent ,arousable HEENT:EOMI Respiratory system: Clear to auscultation anteriorly respiratory effort normal. Cardiovascular system: Regular rate and rhythm, left breast mass skin intact. gastrointestinal system: Abdomen is nondistended, soft and nontender. No organomegaly or masses felt. Normal bowel sounds heard. Central nervous system: Limited exam due to pain medicine and comfort.   Extremities: pt moving all 4 ext and ambulating. Skin: No rashes, lesions or ulcers  Data Reviewed: I have personally reviewed following labs and imaging studies  I/O last 3 completed shifts: In: 699.3 [P.O.:120; I.V.:379.3; IV Piggyback:200] Out: 1750 [Urine:1750] Total I/O In: -  Out: 550 [Urine:550] Lab Results  Component Value Date   CREATININE 0.63 08/17/2020   CREATININE 0.63 08/16/2020   CREATININE 0.59 08/15/2020   CBC: Recent Labs  Lab 08/13/20 1220 08/15/20 2044 08/16/20 0345 08/17/20 1550  WBC 7.0 7.3 8.3 7.2  NEUTROABS 5.4 5.8  --  6.4  HGB 10.6* 9.6* 9.5* 10.1*  HCT 35.1* 30.1* 29.5* 31.7*  MCV 98.9 94.4 93.7 93.0  PLT 185 204 202 960   Basic Metabolic Panel: Recent Labs  Lab 08/13/20 1220 08/15/20 2044 08/16/20 0345 08/17/20 1550  NA 135 133* 133* 129*  K 3.2* 3.2* 3.5 3.3*  CL 93* 90* 91* 89*  CO2 29 29 28 29   GLUCOSE 119* 101* 106* 111*  BUN 12 13 12 18   CREATININE 0.52 0.59 0.63 0.63  CALCIUM 10.2 10.0 9.7 9.5  MG  --   --  1.5* 1.9  PHOS  --   --   --  3.8   GFR: Estimated Creatinine Clearance: 67.5 mL/min (by C-G formula  based on SCr of 0.63 mg/dL). Liver Function Tests: Recent Labs  Lab 08/15/20 2044 08/16/20 0345 08/17/20 1550  AST 34 35 28  ALT 35 37 36  ALKPHOS 94 99 94  BILITOT 0.9 1.2 1.0  PROT 6.7 6.7 6.4*  ALBUMIN 3.3* 3.2* 2.9*   No results for input(s): LIPASE, AMYLASE in the last 168 hours. Recent Labs  Lab 08/15/20 2044  AMMONIA 28    Coagulation Profile: Recent Labs  Lab 08/13/20 1720  INR 1.2   Cardiac Enzymes: No results for input(s): CKTOTAL, CKMB, CKMBINDEX, TROPONINI in the last 168 hours. BNP (last 3 results) No results for input(s): PROBNP in the last 8760 hours. HbA1C: No results for input(s): HGBA1C in the last 72 hours. CBG: No results for input(s): GLUCAP in  the last 168 hours. Lipid Profile: No results for input(s): CHOL, HDL, LDLCALC, TRIG, CHOLHDL, LDLDIRECT in the last 72 hours. Thyroid Function Tests: Recent Labs    08/15/20 2044 08/16/20 0345  TSH 0.307*  --   FREET4  --  1.13*   Anemia Panel: No results for input(s): VITAMINB12, FOLATE, FERRITIN, TIBC, IRON, RETICCTPCT in the last 72 hours. Sepsis Labs: No results for input(s): PROCALCITON, LATICACIDVEN in the last 168 hours.  Recent Results (from the past 240 hour(s))  Resp Panel by RT PCR (RSV, Flu A&B, Covid) - Nasopharyngeal Swab     Status: None   Collection Time: 08/13/20  2:33 PM   Specimen: Nasopharyngeal Swab  Result Value Ref Range Status   SARS Coronavirus 2 by RT PCR NEGATIVE NEGATIVE Final    Comment: (NOTE) SARS-CoV-2 target nucleic acids are NOT DETECTED.  The SARS-CoV-2 RNA is generally detectable in upper respiratoy specimens during the acute phase of infection. The lowest concentration of SARS-CoV-2 viral copies this assay can detect is 131 copies/mL. A negative result does not preclude SARS-Cov-2 infection and should not be used as the sole basis for treatment or other patient management decisions. A negative result may occur with  improper specimen collection/handling,  submission of specimen other than nasopharyngeal swab, presence of viral mutation(s) within the areas targeted by this assay, and inadequate number of viral copies (<131 copies/mL). A negative result must be combined with clinical observations, patient history, and epidemiological information. The expected result is Negative.  Fact Sheet for Patients:  PinkCheek.be  Fact Sheet for Healthcare Providers:  GravelBags.it  This test is no t yet approved or cleared by the [place] FDA and  has been authorized for detection and/or diagnosis of SARS-CoV-2 by FDA under an Emergency Use Authorization (EUA). This EUA will remain  in effect (meaning this test can be used) for the duration of the COVID-19 declaration under Section 564(b)(1) of the Act, 21 U.S.C. section 360bbb-3(b)(1), unless the authorization is terminated or revoked sooner.     Influenza A by PCR NEGATIVE NEGATIVE Final   Influenza B by PCR NEGATIVE NEGATIVE Final    Comment: (NOTE) The Xpert Xpress SARS-CoV-2/FLU/RSV assay is intended as an aid in  the diagnosis of influenza from Nasopharyngeal swab specimens and  should not be used as a sole basis for treatment. Nasal washings and  aspirates are unacceptable for Xpert Xpress SARS-CoV-2/FLU/RSV  testing.  Fact Sheet for Patients: PinkCheek.be  Fact Sheet for Healthcare Providers: GravelBags.it  This test is not yet approved or cleared by the [place] FDA and  has been authorized for detection and/or diagnosis of SARS-CoV-2 by  FDA under an Emergency Use Authorization (EUA). This EUA will remain  in effect (meaning this test can be used) for the duration of the  Covid-19 declaration under Section 564(b)(1) of the Act, 21  U.S.C. section 360bbb-3(b)(1), unless the authorization is  terminated or revoked.    Respiratory Syncytial Virus by PCR  NEGATIVE NEGATIVE Final    Comment: (NOTE) Fact Sheet for Patients: PinkCheek.be  Fact Sheet for Healthcare Providers: GravelBags.it  This test is not yet approved or cleared by the [place] FDA and  has been authorized for detection and/or diagnosis of SARS-CoV-2 by  FDA under an Emergency Use Authorization (EUA). This EUA will remain  in effect (meaning this test can be used) for the duration of the  COVID-19 declaration under Section 564(b)(1) of the Act, 21 U.S.C.  section 360bbb-3(b)(1), unless the authorization is  terminated or  revoked. Performed at [facility], [place] [address] [place], [place] [address]   Respiratory Panel by RT PCR (Flu A&B, Covid) - Nasopharyngeal Swab     Status: None   Collection Time: 08/16/20  3:20 AM   Specimen: Nasopharyngeal Swab  Result Value Ref Range Status   SARS Coronavirus 2 by RT PCR NEGATIVE NEGATIVE Final    Comment: (NOTE) SARS-CoV-2 target nucleic acids are NOT DETECTED.  The SARS-CoV-2 RNA is generally detectable in upper respiratoy specimens during the acute phase of infection. The lowest concentration of SARS-CoV-2 viral copies this assay can detect is 131 copies/mL. A negative result does not preclude SARS-Cov-2 infection and should not be used as the sole basis for treatment or other patient management decisions. A negative result may occur with  improper specimen collection/handling, submission of specimen other than nasopharyngeal swab, presence of viral mutation(s) within the areas targeted by this assay, and inadequate number of viral copies (<131 copies/mL). A negative result must be combined with clinical observations, patient history, and epidemiological information. The expected result is Negative.  Fact Sheet for Patients:  PinkCheek.be  Fact Sheet for Healthcare Providers:   GravelBags.it  This test is no t yet approved or cleared by the [place] FDA and  has been authorized for detection and/or diagnosis of SARS-CoV-2 by FDA under an Emergency Use Authorization (EUA). This EUA will remain  in effect (meaning this test can be used) for the duration of the COVID-19 declaration under Section 564(b)(1) of the Act, 21 U.S.C. section 360bbb-3(b)(1), unless the authorization is terminated or revoked sooner.     Influenza A by PCR NEGATIVE NEGATIVE Final   Influenza B by PCR NEGATIVE NEGATIVE Final    Comment: (NOTE) The Xpert Xpress SARS-CoV-2/FLU/RSV assay is intended as an aid in  the diagnosis of influenza from Nasopharyngeal swab specimens and  should not be used as a sole basis for treatment. Nasal washings and  aspirates are unacceptable for Xpert Xpress SARS-CoV-2/FLU/RSV  testing.  Fact Sheet for Patients: PinkCheek.be  Fact Sheet for Healthcare Providers: GravelBags.it  This test is not yet approved or cleared by the [place] FDA and  has been authorized for detection and/or diagnosis of SARS-CoV-2 by  FDA under an Emergency Use Authorization (EUA). This EUA will remain  in effect (meaning this test can be used) for the duration of the  Covid-19 declaration under Section 564(b)(1) of the Act, 21  U.S.C. section 360bbb-3(b)(1), unless the authorization is  terminated or revoked. Performed at [facility], [place] [address], [place], [place] [address]   Culture, Urine     Status: None   Collection Time: 08/17/20  8:41 AM   Specimen: Urine, Random  Result Value Ref Range Status   Specimen Description URINE, RANDOM  Final   Special Requests NONE  Final   Culture   Final    NO GROWTH Performed at [facility], [place] [address] [place], [place] [address]    Report Status 08/18/2020 FINAL  Final      Radiology Studies: DG Chest Port 1  View  Result Date: 08/17/2020 CLINICAL DATA:  Rhonchi at right lung base EXAM: PORTABLE CHEST 1 VIEW COMPARISON:  08/15/2020 FINDINGS: Single frontal view of the chest demonstrates a stable cardiac silhouette. Chronic interstitial prominence throughout the lungs compatible with scarring. No airspace disease, effusion, or pneumothorax. No acute fractures. Stable diffuse bony metastases unchanged. Right chest wall port unchanged. IMPRESSION: 1. Chronic scarring,  no acute process. 2. Stable bony metastatic disease. Electronically Signed   By: Randa Ngo M.D.   On: 08/17/2020 19:43   Scheduled Meds: . Chlorhexidine Gluconate Cloth  6 each Topical Daily  . [START ON 08/17/2020] dexamethasone (DECADRON) injection  4 mg Intravenous Q8H  . [START ON 08/17/2020] rivaroxaban  20 mg Oral Q supper  . sodium chloride flush  10-40 mL Intracatheter Q12H  . sodium chloride flush  3 mL Intravenous Q12H   Continuous Infusions: . sodium chloride 75 mL/hr at 08/18/20 0608  . levETIRAcetam 500 mg (08/18/20 1413)     LOS: 2 days    Para Skeans, MD [facility] Pager [phone] If 7PM-7AM, please contact night-coverage [email or web address] Password [facility] 08/18/2020, 4:22 PM

## 2020-08-18 NOTE — Progress Notes (Signed)
Manufacturing engineer [facility]) Hospital Liaison note.    Received request from [place] for family interest in [facility]. [facility] is unable to offer a room today. Hospital Liaison will follow up tomorrow or sooner if a room becomes available.  Understanding the serious nature of the patient's condition along with multiple recent losses experienced by the family [facility] counselors will be made aware and early grieving counseling will be offered.   Please do not hesitate to call with questions.  Thank you for the opportunity to participate in this patients care.  Chrislyn Edison Pace, BSN, RN [place] (listed on [place] under Hospice[facility])    [phone]

## 2020-08-19 ENCOUNTER — Ambulatory Visit: Payer: 59

## 2020-08-19 ENCOUNTER — Telehealth: Payer: Self-pay | Admitting: *Deleted

## 2020-08-19 MED ORDER — LEVETIRACETAM 500 MG PO TABS: 500.0000 mg | ORAL_TABLET | Freq: Two times a day (BID) | ORAL | 0 refills | Status: AC

## 2020-08-19 NOTE — Progress Notes (Signed)
Engineer, maintenance [facility]) Hospital Liaison note.   Received request from [place] for family interest in [facility] with request for transfer today. Chart reviewed and eligibility confirmed. Spoke to [place] to confirm interest and explain services. Family agreeable to transfer today. CSW aware.    Registration paper work will be completed by [facility]. Once completed I will update TOC. Dr. Orpah Melter to assume care per family request.    RN please call report to [phone] once consents complete. Please arrange transport for patient   Thank you,    Clementeen Hoof, BSN, RN [place] (listed on [facility] under [facility] [place] [facility] [place])   [phone]

## 2020-08-19 NOTE — Discharge Summary (Addendum)
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 Pt has had seizure while I was at bedside and me and sister were hold pt and nurse came immediately and pt given ativan 1mg  iv.pt also started on keppra per neuro recommendations. Greatly appreciate neurology neurosurgery consults. 08/17/20 Pt is alert, awake and oriented but weak. I suspect the steroids are decreasing the swelling and therefore seizures.  Sister is very happy and relieved , I have d/w her that further care will now be based on  Oncology.  08/18/2020 Patient is somnolent was just given morphine earlier for pain.  Sister Abigail [name] is at bedside She states that she cannot make any decisions for her sister and she wants her to be treated.  She states that her niece is the vision maker in this and when they arrived she would like to talk to all her doctors, case managers. 08/19/2020 Pt condition same and stable. She is oriented and resting.   Breast cancer [facility]) Per oncology.  Due to extensive metastatic disease and terminal prognosis family has decide for comfort measure and hospice facility with [facility].     Discharge Conditon:  Stable.  Diet recommendation:  As tolerated.   Discharge activity: Fall precaution. Seizure precaution. Aspiration precaution.   History of present illness:  Abigail [name] is a 54 y.o. 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  For the past week she is experienced worsening blurry vision of left eye, confusion, and gait changes.  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  She was seen  at oncology earlier today and they attempted to keep her out of the hospital by treating outpatient with Decadron.  And recommended if patient remained in a state that she presented to the clinic in that she presented to the ED for admission and rule out of metabolic causes of encephalopathy.  Also recommended at end-of-life care should be initiated if her current neurologic symptoms are due to her neurologic spread of her cancer.  This was expressed to the patient's sister as she was accompanying in caring for the patient. After leaving the oncologist office, patient continued to do poorly and never perked up per her sister's report.  So she was brought to the ED for further evaluation and treatment.     ED Course: Vital signs significant for tachypnea to the 20s. Lab work showed mild hyponatremia to 133, K3.2, albumin 3.3, hemoglobin 9.6 (baseline 10.5).  Ammonia within normal limits.  TSH mildly low at 0.3.  Urinalysis, urine culture and respiratory panel ordered but not resulted.  Chest x-ray was without acute abnormality.  MRI showed known brain mets with leptomeningeal spread and increase in temporal edema on the left. EDP discussed with neurology and neurosurgery.  Hospital Course:  Pt admitted with encephalopathy due to brain mets and day two of admission pt had one seizure and resolved quickly and ativan 1 mg iv was given.Sister ms patricia has been at bedside and is sad and niece and family has decided for Hospice care at hospice facility.  Pt was started on dexamethasone and keppra and pt became aware and oriented. Imaging has shown metastatic spread to bones and liver and brain and  family  has agreed for hospice center with comfort measure. D/W dr. Benay Spice ans xarelto is d/c.  Procedures: None  Consultations: Oncology:Dr.Sherill. Palliative Care: Vinie Sill.  Discharge Exam: Vitals:   08/19/20 0824 08/19/20 1245  BP: 132/81 (!) 163/91  Pulse: 60 67  Resp: 18 18  Temp: 97.9 F  (36.6 C) 98.2 F (36.8 C)  SpO2: 99% 100%    Physical Exam Vitals and nursing note reviewed.  HENT:     Right Ear: External ear normal.     Left Ear: External ear normal.  Eyes:     Extraocular Movements: Extraocular movements intact.  Cardiovascular:     Rate and Rhythm: Normal rate and regular rhythm.  Pulmonary:     Effort: Pulmonary effort is normal.     Breath sounds: Normal breath sounds.  Abdominal:     Palpations: Abdomen is soft.  Neurological:     General: No focal deficit present.     Mental Status: She is alert and oriented to person, place, and time.     Cranial Nerves: Cranial nerves are intact.     Motor: Weakness present.      Discharge Instructions   Discharge Instructions    Bed rest   Complete by: As directed    Call MD for:  difficulty breathing, headache or visual disturbances   Complete by: As directed    Call MD for:  severe uncontrolled pain   Complete by: As directed    Diet - low sodium heart healthy   Complete by: As directed      Allergies as of 08/19/2020      Reactions   Shellfish Allergy Itching, Swelling   Sulfamethoxazole Swelling      Medication List    STOP taking these medications   potassium chloride SA 20 MEQ tablet Commonly known as: KLOR-CON   rivaroxaban 20 MG Tabs tablet Commonly known as: XARELTO     TAKE these medications   capecitabine 500 MG tablet Commonly known as: XELODA Take 1500 mg AM, Take 1500 mg PM for total dose of 3000 mg daily, Take for 14 days on and 7 days off. Repeat every 21 days What changed:   how much to take  how to take this  when to take this  additional instructions   dexamethasone 4 MG tablet Commonly known as: DECADRON Take 1 tablet (4 mg total) by mouth 2 (two) times daily with a meal for 10 days.   HYDROcodone-acetaminophen 5-325 MG tablet Commonly known as: NORCO/VICODIN Take 1 tablet by mouth every 8 (eight) hours as needed for moderate pain or severe pain.    levETIRAcetam 500 MG tablet Commonly known as: Keppra Take 1 tablet (500 mg total) by mouth 2 (two) times daily.   lidocaine-prilocaine cream Commonly known as: EMLA Apply 1 application topically as directed. Apply to port 1 hour prior to stick and cover with plastic wrap What changed:   when to take this  additional instructions   memantine 10 MG tablet Commonly known as: Namenda Take 1 tablet (10 mg total) by mouth 2 (two) times daily. Start after 4 weeks of titration What changed: Another medication with the same name was removed. Continue taking this medication, and follow the directions you see here.   ondansetron 8 MG tablet Commonly known as: ZOFRAN Take 1 tablet (8 mg total) by mouth every 8 (eight) hours as needed for nausea or vomiting.      Allergies  Allergen Reactions   Shellfish Allergy Itching and  Swelling   Sulfamethoxazole Swelling      The results of significant diagnostics from this hospitalization (including imaging, microbiology, ancillary and laboratory) are listed below for reference.    Significant Diagnostic Studies: MR Brain W and Wo Contrast  Result Date: 08/15/2020 CLINICAL DATA:  Brain tumor follow-up.  History of breast cancer. EXAM: MRI HEAD WITHOUT AND WITH CONTRAST TECHNIQUE: Multiplanar, multiecho pulse sequences of the brain and surrounding structures were obtained without and with intravenous contrast. CONTRAST:  27mL GADAVIST GADOBUTROL 1 MMOL/ML IV SOLN COMPARISON:  MRI head with contrast 08/13/2020 FINDINGS: Brain: Motion degraded study. Abnormal dural nodular enhancement in the left middle cranial fossa similar to the prior study. This extends posteriorly along the tentorium. There is edema in the left anterior temporal lobe on FLAIR which is slightly more prominent. Smaller area of enhancement in the left frontal lobe anteriorly which appears extra-axial and likely is leptomeningeal and dural enhancement due to tumor. This also shows  associated FLAIR hyperintensity in the left frontal lobe which is slightly more prominent. There is abnormal enhancement in the internal auditory canal bilaterally most likely due to tumor. There is enhancement in the left optic nerve a sheath due to leptomeningeal tumor spread. Motion degraded postcontrast image. No definite parenchymal metastatic deposit. Negative for acute infarct. Ventricle size slightly prominent but unchanged. Vascular: Normal arterial flow voids. Skull and upper cervical spine: Bone marrow diffusely low signal on T1 suggesting metastatic disease. Enhancing lesion right mandibular condyle and ramus consistent with metastatic disease. Sinuses/Orbits: Patchy enhancement of the left optic nerve sheath compatible with tumor spread. No mass lesion in the orbit. Other: None IMPRESSION:  Motion degraded study Again noted is metastatic disease to the dura in the left middle cranial fossa.  Mild increase in edema in the left anterior temporal lobe. Similar leptomeningeal enhancement in the left frontal lobe with slight increase in edema in the adjacent left frontal lobe.  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  Bony metastatic disease to the calvarium and right mandibular condyle and ramus.  Electronically Signed   By: Franchot Gallo M.D.   On: 08/15/2020 22:31   MR Brain W and Wo Contrast  Result Date: 08/13/2020 CLINICAL DATA:  Blurry vision left eye, reported abnormal finding eye doctor; history of breast cancer EXAM: MRI HEAD AND ORBITS WITHOUT AND WITH CONTRAST TECHNIQUE: Multiplanar, multiecho pulse sequences of the brain and surrounding structures were obtained without and with intravenous contrast. Multiplanar, multiecho pulse sequences of the orbits and surrounding structures were obtained including fat saturation techniques, before and after intravenous contrast administration. CONTRAST:  57mL GADAVIST  GADOBUTROL 1 MMOL/ML IV SOLN COMPARISON:  None. FINDINGS: MRI HEAD FINDINGS Brain: See series 16 for saved images. There is slightly nodular dural-based enhancement along the left sphenoid bone and middle cranial fossa. Mild edema is present in the underlying left temporal lobe. Suspected adjacent sulcal leptomeningeal enhancement. Dural enhancement extends posteriorly along the left tentorial insertion. Much less prominent dural-based enhancement is also likely present along the medial margin of the right middle cranial fossa. There is abnormal enhancement within the internal auditory canals. There is also abnormal enhancement along the trigeminal nerves. Suspected abnormal leptomeningeal enhancement along the brainstem. Enhancement measuring 9 x 7 mm along the left anterior frontal convexity (series 16, image 19). There is no acute infarction or intracranial hemorrhage. Prominence of the ventricles and sulci reflects minor generalized parenchymal volume loss. Suspected mild disproportionate ventricular prominence.  Patchy foci of T2 hyperintensity in the supratentorial white matter are nonspecific but may reflect minor chronic microvascular ischemic changes. Vascular: Major vessel flow voids at the skull base are preserved. Skull and upper cervical spine: Decreased and mildly heterogeneous T1 marrow signal, which may reflect metastatic disease. Suggestion of an expansile lesion of the right mandibular condyle. Other: Minimal patchy mastoid fluid opacification. Sella is unremarkable. MRI ORBITS FINDINGS Orbits: Asymmetric enhancement of the left optic nerve sheath. Globes, extraocular muscles, and lacrimal glands are symmetric and unremarkable. Visualized sinuses: Minor mucosal thickening Soft tissues: Unremarkable. IMPRESSION: Abnormal dural-based enhancement in the left middle cranial fossa extending posteriorly along the left tentorial insertion. Mild underlying temporal lobe edema. Asymmetric enhancement along  the left optic nerve sheath likely reflects same process. Likely reflects metastatic disease. Subcentimeter enhancement along the left anterior frontal convexity may be parenchymal or extra-axial. There is abnormal leptomeningeal enhancement elsewhere also suspicious for metastatic disease. 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 Abnormal marrow signal suspicious for osseous metastatic disease. There is suspected extraosseous extension about the right mandibular condyle. Electronically Signed   By: Macy Mis M.D.   On: 08/13/2020 16:59   DG Chest Port 1 View  Result Date: 08/17/2020 CLINICAL DATA:  Rhonchi at right lung base EXAM: PORTABLE CHEST 1 VIEW COMPARISON:  08/15/2020 FINDINGS: Single frontal view of the chest demonstrates a stable cardiac silhouette. Chronic interstitial prominence throughout the lungs compatible with scarring. No airspace disease, effusion, or pneumothorax. No acute fractures. Stable diffuse bony metastases unchanged. Right chest wall port unchanged. IMPRESSION: 1. Chronic scarring, no acute process. 2. Stable bony metastatic disease. Electronically Signed   By: Randa Ngo M.D.   On: 08/17/2020 19:43   DG Chest Portable 1 View  Result Date: 08/15/2020 CLINICAL DATA:  Somnolence. Rule out infection leading to encephalopathy. EXAM: PORTABLE CHEST 1 VIEW COMPARISON:  Chest radiograph 12/19/2019.  Chest CT 03/24/2020 FINDINGS: Right chest port remains in place. PleurX catheter at the left lung base on prior chest CT is not definitively seen. There is no evidence of recurrent pleural effusion. No focal airspace disease. Heart is normal in size. No pneumothorax. Known osseous metastatic disease with sclerotic densities involving the shoulders. Many of the thoracic cage metastasis or radiographically occult. IMPRESSION: 1. No evidence of intrathoracic infection. 2. Left PleurX catheter on prior chest CT is not definitively seen. No evidence of recurrent pleural  effusion. 3. Known osseous metastatic disease. Electronically Signed   By: Keith Rake M.D.   On: 08/15/2020 19:18   DG Hip Unilat W or W/O Pelvis 1 View Right  Result Date: 08/06/2020 CLINICAL DATA:  Metastatic breast cancer.  Right hip pain EXAM: DG HIP (WITH OR WITHOUT PELVIS) 1V RIGHT COMPARISON:  None. FINDINGS: Diffuse sclerotic metastatic disease throughout the pelvis and proximal femur bilaterally. Negative for fracture.  Right hip joint space normal. IMPRESSION: Widespread bony metastatic disease.  Negative for fracture. Electronically Signed   By: Franchot Gallo M.D.   On: 08/06/2020 14:11   DG FEMUR, MIN 2 VIEWS RIGHT  Result Date: 08/06/2020 CLINICAL DATA:  Metastatic breast cancer.  Right hip pain 3 weeks EXAM: RIGHT FEMUR 2 VIEWS COMPARISON:  None. FINDINGS: Patchy sclerotic metastatic disease is present in the right femur and throughout the pelvis. Normal right hip and right knee. Negative for fracture. IMPRESSION: Bony metastatic disease in the right femur.  Negative for fracture. Electronically Signed   By: Franchot Gallo M.D.   On: 08/06/2020 14:12   MR ORBITS W WO  CONTRAST  Result Date: 08/13/2020 CLINICAL DATA:  Blurry vision left eye, reported abnormal finding eye doctor; history of breast cancer EXAM: MRI HEAD AND ORBITS WITHOUT AND WITH CONTRAST TECHNIQUE: Multiplanar, multiecho pulse sequences of the brain and surrounding structures were obtained without and with intravenous contrast. Multiplanar, multiecho pulse sequences of the orbits and surrounding structures were obtained including fat saturation techniques, before and after intravenous contrast administration. CONTRAST:  64mL GADAVIST GADOBUTROL 1 MMOL/ML IV SOLN COMPARISON:  None. FINDINGS: MRI HEAD FINDINGS Brain: See series 16 for saved images. There is slightly nodular dural-based enhancement along the left sphenoid bone and middle cranial fossa. Mild edema is present in the underlying left temporal lobe. Suspected  adjacent sulcal leptomeningeal enhancement. Dural enhancement extends posteriorly along the left tentorial insertion. Much less prominent dural-based enhancement is also likely present along the medial margin of the right middle cranial fossa. There is abnormal enhancement within the internal auditory canals. There is also abnormal enhancement along the trigeminal nerves. Suspected abnormal leptomeningeal enhancement along the brainstem. Enhancement measuring 9 x 7 mm along the left anterior frontal convexity (series 16, image 19). There is no acute infarction or intracranial hemorrhage. Prominence of the ventricles and sulci reflects minor generalized parenchymal volume loss. Suspected mild disproportionate ventricular prominence. Patchy foci of T2 hyperintensity in the supratentorial white matter are nonspecific but may reflect minor chronic microvascular ischemic changes. Vascular: Major vessel flow voids at the skull base are preserved. Skull and upper cervical spine: Decreased and mildly heterogeneous T1 marrow signal, which may reflect metastatic disease. Suggestion of an expansile lesion of the right mandibular condyle. Other: Minimal patchy mastoid fluid opacification. Sella is unremarkable. MRI ORBITS FINDINGS Orbits: Asymmetric enhancement of the left optic nerve sheath. Globes, extraocular muscles, and lacrimal glands are symmetric and unremarkable. Visualized sinuses: Minor mucosal thickening Soft tissues: Unremarkable. IMPRESSION: Abnormal dural-based enhancement in the left middle cranial fossa extending posteriorly along the left tentorial insertion. Mild underlying temporal lobe edema. Asymmetric enhancement along the left optic nerve sheath likely reflects same process. Likely reflects metastatic disease. Subcentimeter enhancement along the left anterior frontal convexity may be parenchymal or extra-axial. There is abnormal leptomeningeal enhancement elsewhere also suspicious for metastatic disease.  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 Abnormal marrow signal suspicious for osseous metastatic disease. There is suspected extraosseous extension about the right mandibular condyle. Electronically Signed   By: Macy Mis M.D.   On: 08/13/2020 16:59    Microbiology: Recent Results (from the past 240 hour(s))  Resp Panel by RT PCR (RSV, Flu A&B, Covid) - Nasopharyngeal Swab     Status: None   Collection Time: 08/13/20  2:33 PM   Specimen: Nasopharyngeal Swab  Result Value Ref Range Status   SARS Coronavirus 2 by RT PCR NEGATIVE NEGATIVE Final    Comment: (NOTE) SARS-CoV-2 target nucleic acids are NOT DETECTED.  The SARS-CoV-2 RNA is generally detectable in upper respiratoy specimens during the acute phase of infection. The lowest concentration of SARS-CoV-2 viral copies this assay can detect is 131 copies/mL. A negative result does not preclude SARS-Cov-2 infection and should not be used as the sole basis for treatment or other patient management decisions. A negative result may occur with  improper specimen collection/handling, submission of specimen other than nasopharyngeal swab, presence of viral mutation(s) within the areas targeted by this assay, and inadequate number of viral copies (<131 copies/mL). A negative result must be combined with clinical observations, patient history, and epidemiological information. The expected result is Negative.  Fact Sheet  for Patients:  PinkCheek.be  Fact Sheet for Healthcare Providers:  GravelBags.it  This test is no t yet approved or cleared by the [place] FDA and  has been authorized for detection and/or diagnosis of SARS-CoV-2 by FDA under an Emergency Use Authorization (EUA). This EUA will remain  in effect (meaning this test can be used) for the duration of the COVID-19 declaration under Section 564(b)(1) of the Act, 21 U.S.C. section 360bbb-3(b)(1),  unless the authorization is terminated or revoked sooner.     Influenza A by PCR NEGATIVE NEGATIVE Final   Influenza B by PCR NEGATIVE NEGATIVE Final    Comment: (NOTE) The Xpert Xpress SARS-CoV-2/FLU/RSV assay is intended as an aid in  the diagnosis of influenza from Nasopharyngeal swab specimens and  should not be used as a sole basis for treatment. Nasal washings and  aspirates are unacceptable for Xpert Xpress SARS-CoV-2/FLU/RSV  testing.  Fact Sheet for Patients: PinkCheek.be  Fact Sheet for Healthcare Providers: GravelBags.it  This test is not yet approved or cleared by the [place] FDA and  has been authorized for detection and/or diagnosis of SARS-CoV-2 by  FDA under an Emergency Use Authorization (EUA). This EUA will remain  in effect (meaning this test can be used) for the duration of the  Covid-19 declaration under Section 564(b)(1) of the Act, 21  U.S.C. section 360bbb-3(b)(1), unless the authorization is  terminated or revoked.    Respiratory Syncytial Virus by PCR NEGATIVE NEGATIVE Final    Comment: (NOTE) Fact Sheet for Patients: PinkCheek.be  Fact Sheet for Healthcare Providers: GravelBags.it  This test is not yet approved or cleared by the [place] FDA and  has been authorized for detection and/or diagnosis of SARS-CoV-2 by  FDA under an Emergency Use Authorization (EUA). This EUA will remain  in effect (meaning this test can be used) for the duration of the  COVID-19 declaration under Section 564(b)(1) of the Act, 21 U.S.C.  section 360bbb-3(b)(1), unless the authorization is terminated or  revoked. Performed at [facility], [place] [address], [place], [place] [address]   Respiratory Panel by RT PCR (Flu A&B, Covid) - Nasopharyngeal Swab     Status: None   Collection Time: 08/16/20  3:20 AM   Specimen: Nasopharyngeal Swab   Result Value Ref Range Status   SARS Coronavirus 2 by RT PCR NEGATIVE NEGATIVE Final    Comment: (NOTE) SARS-CoV-2 target nucleic acids are NOT DETECTED.  The SARS-CoV-2 RNA is generally detectable in upper respiratoy specimens during the acute phase of infection. The lowest concentration of SARS-CoV-2 viral copies this assay can detect is 131 copies/mL. A negative result does not preclude SARS-Cov-2 infection and should not be used as the sole basis for treatment or other patient management decisions. A negative result may occur with  improper specimen collection/handling, submission of specimen other than nasopharyngeal swab, presence of viral mutation(s) within the areas targeted by this assay, and inadequate number of viral copies (<131 copies/mL). A negative result must be combined with clinical observations, patient history, and epidemiological information. The expected result is Negative.  Fact Sheet for Patients:  PinkCheek.be  Fact Sheet for Healthcare Providers:  GravelBags.it  This test is no t yet approved or cleared by the [place] FDA and  has been authorized for detection and/or diagnosis of SARS-CoV-2 by FDA under an Emergency Use Authorization (EUA). This EUA will remain  in effect (meaning this test can be used) for the duration of the COVID-19 declaration under Section  564(b)(1) of the Act, 21 U.S.C. section 360bbb-3(b)(1), unless the authorization is terminated or revoked sooner.     Influenza A by PCR NEGATIVE NEGATIVE Final   Influenza B by PCR NEGATIVE NEGATIVE Final    Comment: (NOTE) The Xpert Xpress SARS-CoV-2/FLU/RSV assay is intended as an aid in  the diagnosis of influenza from Nasopharyngeal swab specimens and  should not be used as a sole basis for treatment. Nasal washings and  aspirates are unacceptable for Xpert Xpress SARS-CoV-2/FLU/RSV  testing.  Fact Sheet for  Patients: PinkCheek.be  Fact Sheet for Healthcare Providers: GravelBags.it  This test is not yet approved or cleared by the [place] FDA and  has been authorized for detection and/or diagnosis of SARS-CoV-2 by  FDA under an Emergency Use Authorization (EUA). This EUA will remain  in effect (meaning this test can be used) for the duration of the  Covid-19 declaration under Section 564(b)(1) of the Act, 21  U.S.C. section 360bbb-3(b)(1), unless the authorization is  terminated or revoked. Performed at [facility], [place] [address] [place], [place] [address]   Culture, Urine     Status: None   Collection Time: 08/17/20  8:41 AM   Specimen: Urine, Random  Result Value Ref Range Status   Specimen Description URINE, RANDOM  Final   Special Requests NONE  Final   Culture   Final    NO GROWTH Performed at [facility], [place] [address] [place], [place] [address]    Report Status 08/18/2020 FINAL  Final     Labs: Basic Metabolic Panel: Recent Labs  Lab 08/13/20 1220 08/15/20 2044 08/16/20 0345 08/17/20 1550  NA 135 133* 133* 129*  K 3.2* 3.2* 3.5 3.3*  CL 93* 90* 91* 89*  CO2 29 29 28 29   GLUCOSE 119* 101* 106* 111*  BUN 12 13 12 18   CREATININE 0.52 0.59 0.63 0.63  CALCIUM 10.2 10.0 9.7 9.5  MG  --   --  1.5* 1.9  PHOS  --   --   --  3.8   Liver Function Tests: Recent Labs  Lab 08/15/20 2044 08/16/20 0345 08/17/20 1550  AST 34 35 28  ALT 35 37 36  ALKPHOS 94 99 94  BILITOT 0.9 1.2 1.0  PROT 6.7 6.7 6.4*  ALBUMIN 3.3* 3.2* 2.9*   No results for input(s): LIPASE, AMYLASE in the last 168 hours. Recent Labs  Lab 08/15/20 2044  AMMONIA 28   CBC: Recent Labs  Lab 08/13/20 1220 08/15/20 2044 08/16/20 0345 08/17/20 1550  WBC 7.0 7.3 8.3 7.2  NEUTROABS 5.4 5.8  --  6.4  HGB 10.6* 9.6* 9.5* 10.1*  HCT 35.1* 30.1* 29.5* 31.7*  MCV 98.9 94.4 93.7 93.0  PLT 185 204 202 231   Cardiac  Enzymes: No results for input(s): CKTOTAL, CKMB, CKMBINDEX, TROPONINI in the last 168 hours. BNP: BNP (last 3 results) No results for input(s): BNP in the last 8760 hours.  ProBNP (last 3 results) No results for input(s): PROBNP in the last 8760 hours.  CBG: No results for input(s): GLUCAP in the last 168 hours.    Time spent: 20 minutes  Signed:  Para Skeans MD.  [facility] 08/19/2020, 12:59 PM

## 2020-08-19 NOTE — Progress Notes (Signed)
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 Witnessed seizure in ED.   I met today at [facility] bedside. She is resting comfortably. She has been able to eat a little soup last night but is more confused and having some visual hallucinations today. Sister, [name], spoke with me privately. [name] broke down and is now understanding and recognizing that Devory is at the end of her life. I spent time offering support to [name] as she shares with me her feelings of grief and trying to rely on her faith for strength. She requests continued support from chaplain services. Continue to await bed at [facility]. Joselyne appears comfortable and without any distress. No changes to medications or plan today.  Exam: Lethargic. Confused. Visual hallucinations. No distress. Resting comfortable. Abd soft. Very weak.   Plan: - Transition to [facility] when bed available.  - Comfort care.   [id number] 25 min  Vinie Sill, NP Palliative Medicine Team Pager [phone] (Please see [email or web address] for schedule) Team Phone [phone]    Greater than 50%  of this time was spent counseling and coordinating care related to the above assessment and plan

## 2020-08-19 NOTE — Progress Notes (Signed)
This chaplain responded to PMT consult for spiritual care with the Pt. sister-Patricia.  [name] stepped outside of the room with the chaplain. The chaplain listened as [name] described her relationship with the Pt.  The chaplain understands [name] leans on her personal spirituality in her relationship with the Pt.  The chaplain wonders with [name] what it will look like to honor herself in a pause and the grief she is experiencing.  [name] accepted prayer and F/U spiritual care as needed.

## 2020-08-19 NOTE — Telephone Encounter (Signed)
Left VM to cancel her 2nd opinion appointment there on 09/03/20 due to transition to Hospice care. Will not need an appointment.

## 2020-08-19 NOTE — TOC Transition Note (Signed)
Transition of Care [facility]) - CM/SW Discharge Note   Patient Details  Name: Abigail [name] MRN: [id number] Date of Birth: August 31, 1966  [facility] CM/SW Contact:  Marney Setting, [place] Work Phone Number: 08/19/2020, 2:03 PM   Clinical Narrative:   RN please call report to [phone]. Nurse please call niece Jerrol Banana when Corey Harold picks up the patient at [phone]    Final next level of care: [place] Barriers to Discharge: Barriers Resolved   Patient Goals and CMS Choice Patient states their goals for this hospitalization and ongoing recovery are:: patient unable to participate in goal setting, family just wants to keep her comfortable CMS Medicare.gov Compare Post Acute Care list provided to:: Patient Represenative (must comment) Choice offered to / list presented to :  (niece)  Discharge Placement              Patient chooses bed at:  [facility]) Patient to be transferred to facility by: [place] Name of family member notified: [place] Patient and family notified of of transfer: 08/19/20  Discharge Plan and Services     Post Acute Care Choice: Hospice                               Social Determinants of Health (SDOH) Interventions     Readmission Risk Interventions No flowsheet data found.

## 2020-08-19 NOTE — Progress Notes (Signed)
Abigail [name]  D/C'd to Hospice per MD order.  Discussed with the patient and family, all questions fully answered. Report was called to the nursing home and given to Abigail [name], all questions answered and she verbalized understanding of all information given to her.   VSS, Skin clean, dry and intact without evidence of skin break down, no evidence of skin tears noted. IV catheter discontinued intact. Site without signs and symptoms of complications. Dressing and pressure applied.  An After Visit Summary was printed and given to the [name].  Patient escorted via stretcher, and D/C to hospice  via ambulance.Dorris Carnes 08/19/2020 3:39 PM

## 2020-08-20 ENCOUNTER — Ambulatory Visit: Payer: 59

## 2020-08-21 ENCOUNTER — Ambulatory Visit: Payer: 59 | Admitting: Oncology

## 2020-08-21 ENCOUNTER — Ambulatory Visit: Payer: 59

## 2020-08-22 ENCOUNTER — Ambulatory Visit: Payer: 59

## 2020-08-25 ENCOUNTER — Ambulatory Visit: Payer: 59

## 2020-08-26 ENCOUNTER — Ambulatory Visit: Payer: 59

## 2020-08-27 ENCOUNTER — Ambulatory Visit: Payer: 59

## 2020-08-28 ENCOUNTER — Ambulatory Visit: Payer: 59

## 2020-09-10 DEATH — deceased

## 2020-11-04 NOTE — Progress Notes (Signed)
  Radiation Oncology         [phone] ________________________________  Name: Abigail [name] MRN: [id number]  Date: 08/15/2020  DOB: 1966-10-06  End of Treatment Note  Diagnosis:   brain metastasis   Indication for treatment::  palliative       Radiation treatment dates:   08/14/20 - 08/15/20  Site/dose:   The patient was planned to receive a course of whole brain radiation therapy to a dose of 30 Gy in 10 fractions.  This was accomplished using a 2 field technique with additional reduced fields as necessary to improve dose homogeneity.  Narrative: The patient tolerated radiation treatment but the treatment was discontinued after 6 Gy/ 2 fractions due to her overall status.  Plan: The patient will return to our clinic on a prn basis. ________________________________  Jodelle Gross, M.D., Ph.D.

## 2023-07-26 NOTE — Telephone Encounter (Signed)
TC
